# Patient Record
Sex: Female | Born: 1950 | Race: White | Hispanic: No | Marital: Single | Smoking: Former smoker
Health system: Southern US, Community
[De-identification: ages and names within clinical notes are randomized; demographics above are authoritative.]

## PROBLEM LIST (undated history)

## (undated) DIAGNOSIS — C688 Malignant neoplasm of overlapping sites of urinary organs: Secondary | ICD-10-CM

## (undated) DIAGNOSIS — J449 Chronic obstructive pulmonary disease, unspecified: Secondary | ICD-10-CM

## (undated) DIAGNOSIS — E119 Type 2 diabetes mellitus without complications: Secondary | ICD-10-CM

## (undated) DIAGNOSIS — J45909 Unspecified asthma, uncomplicated: Secondary | ICD-10-CM

## (undated) DIAGNOSIS — Z923 Personal history of irradiation: Secondary | ICD-10-CM

## (undated) DIAGNOSIS — M549 Dorsalgia, unspecified: Secondary | ICD-10-CM

## (undated) HISTORY — DX: Personal history of irradiation: Z92.3

## (undated) HISTORY — PX: APPENDECTOMY: SHX54

## (undated) HISTORY — PX: TONSILLECTOMY: SUR1361

---

## 1995-06-21 HISTORY — PX: OTHER SURGICAL HISTORY: SHX169

## 1998-09-22 ENCOUNTER — Inpatient Hospital Stay (HOSPITAL_COMMUNITY): Admission: EM | Admit: 1998-09-22 | Discharge: 1998-09-25 | Payer: Self-pay | Admitting: Emergency Medicine

## 1998-09-22 ENCOUNTER — Encounter: Payer: Self-pay | Admitting: Emergency Medicine

## 1998-09-26 ENCOUNTER — Encounter: Admission: RE | Admit: 1998-09-26 | Discharge: 1998-09-26 | Payer: Self-pay | Admitting: Sports Medicine

## 1998-11-02 ENCOUNTER — Encounter: Admission: RE | Admit: 1998-11-02 | Discharge: 1998-11-02 | Payer: Self-pay | Admitting: Family Medicine

## 1998-12-15 ENCOUNTER — Other Ambulatory Visit: Admission: RE | Admit: 1998-12-15 | Discharge: 1998-12-15 | Payer: Self-pay | Admitting: Obstetrics & Gynecology

## 1998-12-15 ENCOUNTER — Encounter: Admission: RE | Admit: 1998-12-15 | Discharge: 1998-12-15 | Payer: Self-pay | Admitting: Obstetrics & Gynecology

## 1999-01-01 ENCOUNTER — Encounter: Admission: RE | Admit: 1999-01-01 | Discharge: 1999-01-01 | Payer: Self-pay | Admitting: Family Medicine

## 1999-01-05 ENCOUNTER — Ambulatory Visit (HOSPITAL_COMMUNITY): Admission: RE | Admit: 1999-01-05 | Discharge: 1999-01-05 | Payer: Self-pay

## 1999-01-11 ENCOUNTER — Ambulatory Visit (HOSPITAL_COMMUNITY): Admission: RE | Admit: 1999-01-11 | Discharge: 1999-01-11 | Payer: Self-pay

## 1999-01-11 ENCOUNTER — Encounter: Payer: Self-pay | Admitting: Obstetrics & Gynecology

## 1999-02-12 ENCOUNTER — Encounter: Admission: RE | Admit: 1999-02-12 | Discharge: 1999-02-12 | Payer: Self-pay | Admitting: Family Medicine

## 1999-03-16 ENCOUNTER — Encounter: Admission: RE | Admit: 1999-03-16 | Discharge: 1999-03-16 | Payer: Self-pay | Admitting: Obstetrics & Gynecology

## 1999-03-17 ENCOUNTER — Encounter: Admission: RE | Admit: 1999-03-17 | Discharge: 1999-03-17 | Payer: Self-pay | Admitting: Family Medicine

## 1999-04-08 ENCOUNTER — Encounter: Payer: Self-pay | Admitting: *Deleted

## 1999-04-13 ENCOUNTER — Inpatient Hospital Stay (HOSPITAL_COMMUNITY): Admission: RE | Admit: 1999-04-13 | Discharge: 1999-04-14 | Payer: Self-pay | Admitting: *Deleted

## 1999-04-29 ENCOUNTER — Encounter: Admission: RE | Admit: 1999-04-29 | Discharge: 1999-04-29 | Payer: Self-pay | Admitting: Obstetrics

## 1999-05-28 ENCOUNTER — Encounter: Admission: RE | Admit: 1999-05-28 | Discharge: 1999-05-28 | Payer: Self-pay | Admitting: Family Medicine

## 1999-07-27 ENCOUNTER — Encounter: Admission: RE | Admit: 1999-07-27 | Discharge: 1999-07-27 | Payer: Self-pay | Admitting: Family Medicine

## 1999-07-27 ENCOUNTER — Ambulatory Visit (HOSPITAL_COMMUNITY): Admission: RE | Admit: 1999-07-27 | Discharge: 1999-07-27 | Payer: Self-pay | Admitting: *Deleted

## 1999-07-27 ENCOUNTER — Encounter: Payer: Self-pay | Admitting: *Deleted

## 1999-08-26 ENCOUNTER — Encounter: Admission: RE | Admit: 1999-08-26 | Discharge: 1999-08-26 | Payer: Self-pay | Admitting: Sports Medicine

## 1999-08-26 ENCOUNTER — Encounter: Payer: Self-pay | Admitting: Sports Medicine

## 1999-08-26 ENCOUNTER — Encounter: Admission: RE | Admit: 1999-08-26 | Discharge: 1999-08-26 | Payer: Self-pay | Admitting: Family Medicine

## 1999-08-30 ENCOUNTER — Encounter: Admission: RE | Admit: 1999-08-30 | Discharge: 1999-08-30 | Payer: Self-pay | Admitting: Family Medicine

## 1999-09-06 ENCOUNTER — Encounter: Admission: RE | Admit: 1999-09-06 | Discharge: 1999-09-06 | Payer: Self-pay | Admitting: Family Medicine

## 1999-09-07 ENCOUNTER — Ambulatory Visit (HOSPITAL_BASED_OUTPATIENT_CLINIC_OR_DEPARTMENT_OTHER): Admission: RE | Admit: 1999-09-07 | Discharge: 1999-09-07 | Payer: Self-pay | Admitting: Ophthalmology

## 1999-09-30 ENCOUNTER — Encounter: Admission: RE | Admit: 1999-09-30 | Discharge: 1999-09-30 | Payer: Self-pay | Admitting: Obstetrics

## 1999-10-06 ENCOUNTER — Encounter: Admission: RE | Admit: 1999-10-06 | Discharge: 1999-10-06 | Payer: Self-pay | Admitting: Family Medicine

## 1999-10-29 ENCOUNTER — Encounter: Admission: RE | Admit: 1999-10-29 | Discharge: 1999-10-29 | Payer: Self-pay | Admitting: Family Medicine

## 1999-11-17 ENCOUNTER — Encounter: Admission: RE | Admit: 1999-11-17 | Discharge: 1999-11-17 | Payer: Self-pay | Admitting: Family Medicine

## 1999-12-23 ENCOUNTER — Encounter: Admission: RE | Admit: 1999-12-23 | Discharge: 1999-12-23 | Payer: Self-pay | Admitting: Family Medicine

## 1999-12-30 ENCOUNTER — Encounter: Admission: RE | Admit: 1999-12-30 | Discharge: 1999-12-30 | Payer: Self-pay | Admitting: Family Medicine

## 2000-01-06 ENCOUNTER — Encounter: Admission: RE | Admit: 2000-01-06 | Discharge: 2000-01-06 | Payer: Self-pay | Admitting: Family Medicine

## 2000-01-13 ENCOUNTER — Encounter: Admission: RE | Admit: 2000-01-13 | Discharge: 2000-01-13 | Payer: Self-pay | Admitting: Family Medicine

## 2000-01-28 ENCOUNTER — Encounter: Payer: Self-pay | Admitting: *Deleted

## 2000-01-28 ENCOUNTER — Encounter: Admission: RE | Admit: 2000-01-28 | Discharge: 2000-01-28 | Payer: Self-pay | Admitting: *Deleted

## 2000-01-28 ENCOUNTER — Encounter: Admission: RE | Admit: 2000-01-28 | Discharge: 2000-01-28 | Payer: Self-pay | Admitting: Family Medicine

## 2000-02-23 ENCOUNTER — Encounter: Admission: RE | Admit: 2000-02-23 | Discharge: 2000-02-23 | Payer: Self-pay | Admitting: Family Medicine

## 2000-03-02 ENCOUNTER — Ambulatory Visit (HOSPITAL_COMMUNITY): Admission: RE | Admit: 2000-03-02 | Discharge: 2000-03-02 | Payer: Self-pay | Admitting: Family Medicine

## 2000-03-29 ENCOUNTER — Encounter: Admission: RE | Admit: 2000-03-29 | Discharge: 2000-03-29 | Payer: Self-pay | Admitting: Family Medicine

## 2000-04-18 ENCOUNTER — Encounter: Admission: RE | Admit: 2000-04-18 | Discharge: 2000-04-18 | Payer: Self-pay | Admitting: Family Medicine

## 2000-07-04 ENCOUNTER — Encounter: Admission: RE | Admit: 2000-07-04 | Discharge: 2000-07-04 | Payer: Self-pay | Admitting: Sports Medicine

## 2000-08-23 ENCOUNTER — Encounter: Admission: RE | Admit: 2000-08-23 | Discharge: 2000-08-23 | Payer: Self-pay | Admitting: Family Medicine

## 2000-10-19 ENCOUNTER — Encounter: Admission: RE | Admit: 2000-10-19 | Discharge: 2000-10-19 | Payer: Self-pay | Admitting: Sports Medicine

## 2000-10-24 ENCOUNTER — Encounter: Payer: Self-pay | Admitting: Sports Medicine

## 2000-10-24 ENCOUNTER — Encounter: Admission: RE | Admit: 2000-10-24 | Discharge: 2000-10-24 | Payer: Self-pay | Admitting: Sports Medicine

## 2001-01-08 ENCOUNTER — Encounter: Admission: RE | Admit: 2001-01-08 | Discharge: 2001-01-08 | Payer: Self-pay | Admitting: Family Medicine

## 2001-02-07 ENCOUNTER — Encounter: Admission: RE | Admit: 2001-02-07 | Discharge: 2001-02-07 | Payer: Self-pay | Admitting: Family Medicine

## 2001-02-16 ENCOUNTER — Encounter: Admission: RE | Admit: 2001-02-16 | Discharge: 2001-02-16 | Payer: Self-pay | Admitting: Sports Medicine

## 2001-02-23 ENCOUNTER — Encounter: Admission: RE | Admit: 2001-02-23 | Discharge: 2001-02-23 | Payer: Self-pay | Admitting: Family Medicine

## 2001-02-23 ENCOUNTER — Encounter: Payer: Self-pay | Admitting: Family Medicine

## 2001-03-22 ENCOUNTER — Encounter: Admission: RE | Admit: 2001-03-22 | Discharge: 2001-03-22 | Payer: Self-pay | Admitting: Family Medicine

## 2001-05-08 ENCOUNTER — Encounter: Admission: RE | Admit: 2001-05-08 | Discharge: 2001-05-08 | Payer: Self-pay | Admitting: Family Medicine

## 2001-05-08 ENCOUNTER — Encounter: Payer: Self-pay | Admitting: Sports Medicine

## 2001-05-08 ENCOUNTER — Encounter: Admission: RE | Admit: 2001-05-08 | Discharge: 2001-05-08 | Payer: Self-pay | Admitting: Sports Medicine

## 2001-05-16 ENCOUNTER — Encounter: Admission: RE | Admit: 2001-05-16 | Discharge: 2001-05-16 | Payer: Self-pay | Admitting: Family Medicine

## 2001-06-07 ENCOUNTER — Encounter: Admission: RE | Admit: 2001-06-07 | Discharge: 2001-06-07 | Payer: Self-pay | Admitting: Family Medicine

## 2001-07-16 ENCOUNTER — Encounter: Admission: RE | Admit: 2001-07-16 | Discharge: 2001-07-16 | Payer: Self-pay | Admitting: Sports Medicine

## 2001-10-04 ENCOUNTER — Encounter: Admission: RE | Admit: 2001-10-04 | Discharge: 2001-10-04 | Payer: Self-pay | Admitting: Sports Medicine

## 2001-10-23 ENCOUNTER — Encounter: Admission: RE | Admit: 2001-10-23 | Discharge: 2001-10-23 | Payer: Self-pay | Admitting: Sports Medicine

## 2001-10-23 ENCOUNTER — Encounter: Payer: Self-pay | Admitting: Sports Medicine

## 2001-12-25 ENCOUNTER — Encounter: Admission: RE | Admit: 2001-12-25 | Discharge: 2001-12-25 | Payer: Self-pay | Admitting: Family Medicine

## 2004-06-17 ENCOUNTER — Ambulatory Visit: Payer: Self-pay | Admitting: Pulmonary Disease

## 2004-09-14 ENCOUNTER — Ambulatory Visit: Payer: Self-pay | Admitting: Pulmonary Disease

## 2005-02-01 ENCOUNTER — Ambulatory Visit: Payer: Self-pay | Admitting: Pulmonary Disease

## 2006-01-19 ENCOUNTER — Ambulatory Visit: Payer: Self-pay | Admitting: Pulmonary Disease

## 2015-03-03 ENCOUNTER — Institutional Professional Consult (permissible substitution): Payer: Self-pay | Admitting: Pulmonary Disease

## 2015-03-10 ENCOUNTER — Encounter: Payer: Self-pay | Admitting: Pulmonary Disease

## 2015-03-19 ENCOUNTER — Institutional Professional Consult (permissible substitution): Payer: Self-pay | Admitting: Pulmonary Disease

## 2015-03-25 ENCOUNTER — Inpatient Hospital Stay (HOSPITAL_COMMUNITY)
Admission: EM | Admit: 2015-03-25 | Discharge: 2015-04-01 | DRG: 190 | Disposition: A | Discharge status: Hospice | Payer: Medicare Other | Attending: Internal Medicine | Admitting: Internal Medicine

## 2015-03-25 ENCOUNTER — Emergency Department (HOSPITAL_COMMUNITY): Payer: Medicare Other

## 2015-03-25 ENCOUNTER — Encounter (HOSPITAL_COMMUNITY): Payer: Self-pay | Admitting: Emergency Medicine

## 2015-03-25 DIAGNOSIS — J9621 Acute and chronic respiratory failure with hypoxia: Secondary | ICD-10-CM | POA: Insufficient documentation

## 2015-03-25 DIAGNOSIS — G8929 Other chronic pain: Secondary | ICD-10-CM | POA: Diagnosis present

## 2015-03-25 DIAGNOSIS — Z8673 Personal history of transient ischemic attack (TIA), and cerebral infarction without residual deficits: Secondary | ICD-10-CM | POA: Diagnosis not present

## 2015-03-25 DIAGNOSIS — Z66 Do not resuscitate: Secondary | ICD-10-CM | POA: Diagnosis present

## 2015-03-25 DIAGNOSIS — F32A Depression, unspecified: Secondary | ICD-10-CM

## 2015-03-25 DIAGNOSIS — F13239 Sedative, hypnotic or anxiolytic dependence with withdrawal, unspecified: Secondary | ICD-10-CM | POA: Diagnosis present

## 2015-03-25 DIAGNOSIS — Z79891 Long term (current) use of opiate analgesic: Secondary | ICD-10-CM

## 2015-03-25 DIAGNOSIS — Z515 Encounter for palliative care: Secondary | ICD-10-CM | POA: Insufficient documentation

## 2015-03-25 DIAGNOSIS — R400 Somnolence: Secondary | ICD-10-CM

## 2015-03-25 DIAGNOSIS — E1165 Type 2 diabetes mellitus with hyperglycemia: Secondary | ICD-10-CM | POA: Diagnosis present

## 2015-03-25 DIAGNOSIS — W19XXXA Unspecified fall, initial encounter: Secondary | ICD-10-CM

## 2015-03-25 DIAGNOSIS — R0602 Shortness of breath: Secondary | ICD-10-CM | POA: Diagnosis present

## 2015-03-25 DIAGNOSIS — R410 Disorientation, unspecified: Secondary | ICD-10-CM | POA: Diagnosis not present

## 2015-03-25 DIAGNOSIS — Z825 Family history of asthma and other chronic lower respiratory diseases: Secondary | ICD-10-CM | POA: Diagnosis not present

## 2015-03-25 DIAGNOSIS — J9601 Acute respiratory failure with hypoxia: Secondary | ICD-10-CM | POA: Diagnosis not present

## 2015-03-25 DIAGNOSIS — I4581 Long QT syndrome: Secondary | ICD-10-CM | POA: Diagnosis present

## 2015-03-25 DIAGNOSIS — G9340 Encephalopathy, unspecified: Secondary | ICD-10-CM | POA: Diagnosis present

## 2015-03-25 DIAGNOSIS — Z9981 Dependence on supplemental oxygen: Secondary | ICD-10-CM | POA: Diagnosis not present

## 2015-03-25 DIAGNOSIS — F329 Major depressive disorder, single episode, unspecified: Secondary | ICD-10-CM | POA: Diagnosis present

## 2015-03-25 DIAGNOSIS — J9622 Acute and chronic respiratory failure with hypercapnia: Secondary | ICD-10-CM | POA: Diagnosis present

## 2015-03-25 DIAGNOSIS — F17200 Nicotine dependence, unspecified, uncomplicated: Secondary | ICD-10-CM | POA: Diagnosis present

## 2015-03-25 DIAGNOSIS — E46 Unspecified protein-calorie malnutrition: Secondary | ICD-10-CM | POA: Diagnosis present

## 2015-03-25 DIAGNOSIS — E119 Type 2 diabetes mellitus without complications: Secondary | ICD-10-CM

## 2015-03-25 DIAGNOSIS — T424X5A Adverse effect of benzodiazepines, initial encounter: Secondary | ICD-10-CM | POA: Diagnosis present

## 2015-03-25 DIAGNOSIS — Z833 Family history of diabetes mellitus: Secondary | ICD-10-CM

## 2015-03-25 DIAGNOSIS — J441 Chronic obstructive pulmonary disease with (acute) exacerbation: Principal | ICD-10-CM | POA: Diagnosis present

## 2015-03-25 HISTORY — DX: Chronic obstructive pulmonary disease, unspecified: J44.9

## 2015-03-25 HISTORY — DX: Dorsalgia, unspecified: M54.9

## 2015-03-25 HISTORY — DX: Type 2 diabetes mellitus without complications: E11.9

## 2015-03-25 HISTORY — DX: Unspecified asthma, uncomplicated: J45.909

## 2015-03-25 LAB — AMMONIA: AMMONIA: 15 umol/L (ref 9–35)

## 2015-03-25 LAB — CBC WITH DIFFERENTIAL/PLATELET
BASOS ABS: 0 10*3/uL (ref 0.0–0.1)
BASOS PCT: 0 %
EOS ABS: 0 10*3/uL (ref 0.0–0.7)
EOS PCT: 0 %
HCT: 44.8 % (ref 36.0–46.0)
Hemoglobin: 14.4 g/dL (ref 12.0–15.0)
Lymphocytes Relative: 9 %
Lymphs Abs: 0.6 10*3/uL — ABNORMAL LOW (ref 0.7–4.0)
MCH: 29.9 pg (ref 26.0–34.0)
MCHC: 32.1 g/dL (ref 30.0–36.0)
MCV: 92.9 fL (ref 78.0–100.0)
MONO ABS: 0.5 10*3/uL (ref 0.1–1.0)
Monocytes Relative: 7 %
Neutro Abs: 5.9 10*3/uL (ref 1.7–7.7)
Neutrophils Relative %: 84 %
PLATELETS: 235 10*3/uL (ref 150–400)
RBC: 4.82 MIL/uL (ref 3.87–5.11)
RDW: 14.8 % (ref 11.5–15.5)
WBC: 7 10*3/uL (ref 4.0–10.5)

## 2015-03-25 LAB — I-STAT TROPONIN, ED: TROPONIN I, POC: 0.01 ng/mL (ref 0.00–0.08)

## 2015-03-25 LAB — COMPREHENSIVE METABOLIC PANEL
ALT: 9 U/L — AB (ref 14–54)
AST: 14 U/L — AB (ref 15–41)
Albumin: 4.7 g/dL (ref 3.5–5.0)
Alkaline Phosphatase: 103 U/L (ref 38–126)
Anion gap: 12 (ref 5–15)
BILIRUBIN TOTAL: 0.3 mg/dL (ref 0.3–1.2)
BUN: 11 mg/dL (ref 6–20)
CALCIUM: 9.6 mg/dL (ref 8.9–10.3)
CHLORIDE: 97 mmol/L — AB (ref 101–111)
CO2: 30 mmol/L (ref 22–32)
CREATININE: 0.62 mg/dL (ref 0.44–1.00)
Glucose, Bld: 167 mg/dL — ABNORMAL HIGH (ref 65–99)
Potassium: 3.9 mmol/L (ref 3.5–5.1)
Sodium: 139 mmol/L (ref 135–145)
TOTAL PROTEIN: 7.5 g/dL (ref 6.5–8.1)

## 2015-03-25 LAB — BLOOD GAS, ARTERIAL
Acid-Base Excess: 3 mmol/L — ABNORMAL HIGH (ref 0.0–2.0)
Bicarbonate: 29 mEq/L — ABNORMAL HIGH (ref 20.0–24.0)
Drawn by: 331471
O2 CONTENT: 4 L/min
O2 Saturation: 92.9 %
PATIENT TEMPERATURE: 98.6
PCO2 ART: 52.4 mmHg — AB (ref 35.0–45.0)
PH ART: 7.362 (ref 7.350–7.450)
PO2 ART: 67.9 mmHg — AB (ref 80.0–100.0)
TCO2: 25.7 mmol/L (ref 0–100)

## 2015-03-25 LAB — ETHANOL

## 2015-03-25 LAB — LIPASE, BLOOD: LIPASE: 22 U/L (ref 22–51)

## 2015-03-25 MED ORDER — METHYLPREDNISOLONE SODIUM SUCC 125 MG IJ SOLR
125.0000 mg | Freq: Once | INTRAMUSCULAR | Status: AC
  Administered 2015-03-25: 125 mg via INTRAVENOUS
  Filled 2015-03-25: qty 2

## 2015-03-25 MED ORDER — DEXTROSE 5 % IV SOLN
1.0000 g | INTRAVENOUS | Status: AC
  Administered 2015-03-26 – 2015-03-27 (×2): 1 g via INTRAVENOUS
  Filled 2015-03-25 (×2): qty 10

## 2015-03-25 MED ORDER — ALBUTEROL (5 MG/ML) CONTINUOUS INHALATION SOLN
15.0000 mg/h | INHALATION_SOLUTION | Freq: Once | RESPIRATORY_TRACT | Status: AC
  Administered 2015-03-25: 15 mg/h via RESPIRATORY_TRACT
  Filled 2015-03-25: qty 20

## 2015-03-25 MED ORDER — LORAZEPAM 2 MG/ML IJ SOLN
1.0000 mg | Freq: Once | INTRAMUSCULAR | Status: AC
  Administered 2015-03-25: 1 mg via INTRAVENOUS
  Filled 2015-03-25: qty 1

## 2015-03-25 MED ORDER — SODIUM CHLORIDE 0.9 % IV BOLUS (SEPSIS)
1000.0000 mL | Freq: Once | INTRAVENOUS | Status: AC
  Administered 2015-03-25: 1000 mL via INTRAVENOUS

## 2015-03-25 MED ORDER — ONDANSETRON HCL 4 MG/2ML IJ SOLN
4.0000 mg | Freq: Once | INTRAMUSCULAR | Status: AC
  Administered 2015-03-25: 4 mg via INTRAVENOUS
  Filled 2015-03-25: qty 2

## 2015-03-25 MED ORDER — MORPHINE SULFATE (PF) 2 MG/ML IV SOLN
1.0000 mg | INTRAVENOUS | Status: DC | PRN
  Administered 2015-03-25 – 2015-03-26 (×2): 1 mg via INTRAVENOUS
  Filled 2015-03-25 (×2): qty 1

## 2015-03-25 MED ORDER — DEXTROSE 5 % IV SOLN
1.0000 g | Freq: Once | INTRAVENOUS | Status: AC
  Administered 2015-03-25: 1 g via INTRAVENOUS
  Filled 2015-03-25: qty 10

## 2015-03-25 MED ORDER — IPRATROPIUM BROMIDE 0.02 % IN SOLN
1.0000 mg | Freq: Once | RESPIRATORY_TRACT | Status: AC
  Administered 2015-03-25: 1 mg via RESPIRATORY_TRACT
  Filled 2015-03-25: qty 5

## 2015-03-25 MED ORDER — SALINE SPRAY 0.65 % NA SOLN
1.0000 | NASAL | Status: DC | PRN
  Filled 2015-03-25: qty 44

## 2015-03-25 MED ORDER — MAGNESIUM SULFATE 2 GM/50ML IV SOLN
2.0000 g | Freq: Once | INTRAVENOUS | Status: AC
  Administered 2015-03-25: 2 g via INTRAVENOUS
  Filled 2015-03-25: qty 50

## 2015-03-25 MED ORDER — KETOROLAC TROMETHAMINE 30 MG/ML IJ SOLN: 30.0000 mg | Freq: Four times a day (QID) | INTRAMUSCULAR | Status: DC | PRN

## 2015-03-25 NOTE — ED Notes (Signed)
Per [facility], pt coming from home has veen having n/v/SOB x 1 week.  Has a productive cough, is oxygen dependent on 3L.  Has hx of high cholesterol, COPD, diabetes.

## 2015-03-25 NOTE — ED Notes (Signed)
Bed: WA06 Expected date:  Expected time:  Means of arrival:  Comments: Ems- 64 yo N/V

## 2015-03-25 NOTE — Progress Notes (Signed)
Cm noted pt with medicaid [place] access Cm noted Dr Heide Scales as pcp  CM attempted to assess pt but she is a poor historian Pt is in and out  Cm spoke with staff at pcp office confirmed pcp and that pt had been seen 03/24/15  Pt keep stating she filed "in [place]" Not sure what pt is referring to

## 2015-03-25 NOTE — Progress Notes (Signed)
Brief Pharmacy note: Rocephin  Received Rocephin 1gm in ED today, plan 2 more days Rocephin for CAP/COPD exacerbation, pharmacy dosing assistance requested.  Rocephin needs no adjustment for renal function: patient's renal function wnl  Rocephin 1gm q24 starting tomorrow at 1600 x 2 more doses.  Thank you,  Minda Ditto PharmD Pager [phone] 03/25/2015, 6:43 PM

## 2015-03-25 NOTE — H&P (Signed)
[facility] History and Physical  Traci Thomas IEP:[id number] DOB: 1950-08-25 DOA: 03/25/2015  Referring physician:  Sherwood Gambler PCP:  [name], NP   Chief Complaint:  Fall and increased SOB  HPI:  The patient is a 64 y.o. year-old female with history of COPD on 3L [place] with ongoing smoking, benzodiazepine dependence, chronic pain on chronic narcotics, depression, diabetes mellitus type 2, bladder spasms who presents with mechanical fall and increased SOB.  History is limited by patient's delirium and somnolence.  Patient reports that she tripped over a portable toilet and hit her head, her left arm, and landed on her hip.  She came to the ER because of her fall and ongoing headache and neck pain.  Incidentally, she reported that she has had some increased SOB over the last few days with increased cough.  She was unable to tell me if she had increased sputum or changes to sputum color.  She denied fevers.  She still smokes about a pack per day and takes her oxygen off when she does.    In the ER, her VS were notable for temperature of 99.64F, pulse in the low 100s, RR in mid 20s, BP as high as 184/87, O2 sat 78% on her home 3L .  Labs were wnl except for blood gas with pH 7.36, pCO2 52.4, pO2 67.9.  Bibasilar pleural thickening on CXR.  XR of the left formearm and pelvis were wnl.  CT head and cervical spine demionstrated no acute injury but moderate diffuse cortical atrophy and moderate chronic ischemic white matter disease.  She was given  Duoneb, Magnesium, solumedrol, ativan, zofran, and ceftriaxone.  She had significant tremors in ER which she attributed to not taking her xanax today.  She does not take her xanax as prescribed.  She reported taking 2 tablets with breakfast and then taking additional doses throughout the day as needed, up to four times per day.  She is being admitted to stepdown due to current somnolence and confusion in the setting of possible benzodiazepine withdrawal  and COPD exacerbation.    Review of Systems:  Limited by patient's confusion/delirium.      Past Medical History  Diagnosis Date  . Diabetes mellitus without complication ([place])   . Asthma   . Back pain   . COPD (chronic obstructive pulmonary disease) (HCC)     Home O2 3lpm, theophylline   Past Surgical History  Procedure Laterality Date  . Appendectomy    . Tonsillectomy     Social History:  reports that she has been smoking.  She does not have any smokeless tobacco history on file. She reports that she drinks alcohol. She reports that she does not use illicit drugs.  Allergies  Allergen Reactions  . Adhesive [Tape] Other (See Comments)    Skin peels off   . Ciprofloxacin     Unknown reaction   . Prednisone Nausea And Vomiting    Family History  Problem Relation Age of Onset  . COPD Mother   . Diabetes       Prior to Admission medications   Medication Sig Start Date End Date Taking? Authorizing Provider  albuterol (PROVENTIL HFA;VENTOLIN HFA) 108 (90 BASE) MCG/ACT inhaler Inhale 2 puffs into the lungs every 6 (six) hours as needed for wheezing or shortness of breath.   Yes Historical Provider, MD  ALPRAZolam Duanne Moron) 1 MG tablet Take 1 mg by mouth every 6 (six) hours.   Yes Historical Provider, MD  Azelastine-Fluticasone [facility]) 137-50  MCG/ACT SUSP Place into the nose.   Yes Historical Provider, MD  desvenlafaxine (PRISTIQ) 100 MG 24 hr tablet Take 100 mg by mouth daily.   Yes Historical Provider, MD  HYDROcodone-acetaminophen (NORCO) 10-325 MG tablet Take 1 tablet by mouth 3 (three) times daily.   Yes Historical Provider, MD  lansoprazole (PREVACID) 30 MG capsule Take 30 mg by mouth 2 (two) times daily before a meal.   Yes Historical Provider, MD  metFORMIN (GLUCOPHAGE) 500 MG tablet Take 500 mg by mouth daily with breakfast.   Yes Historical Provider, MD  mometasone (NASONEX) 50 MCG/ACT nasal spray Place 2 sprays into the nose daily.   Yes Historical Provider, MD   mometasone-formoterol (DULERA) 200-5 MCG/ACT AERO Inhale 2 puffs into the lungs 2 (two) times daily.   Yes Historical Provider, MD  nystatin cream (MYCOSTATIN) Apply 1 application topically 2 (two) times daily.   Yes Historical Provider, MD  oxybutynin (DITROPAN) 5 MG tablet Take 5 mg by mouth at bedtime.   Yes Historical Provider, MD  potassium chloride SA (K-DUR,KLOR-CON) 20 MEQ tablet Take 60 mEq by mouth 3 (three) times daily. Suppose to take for 3 days 03/19/15  Yes Historical Provider, MD  theophylline (THEO-24) 300 MG 24 hr capsule Take 300 mg by mouth 2 (two) times daily.   Yes Historical Provider, MD  triamcinolone cream (KENALOG) 0.1 % Apply 1 application topically 2 (two) times daily.   Yes Historical Provider, MD  Vitamin D, Ergocalciferol, (DRISDOL) 50000 UNITS CAPS capsule Take 50,000 Units by mouth every 7 (seven) days.   Yes Historical Provider, MD   Physical Exam: Filed Vitals:   03/25/15 1244 03/25/15 1407 03/25/15 1556 03/25/15 1600  BP:  184/87 140/80 145/73  Pulse:  120 109 107  Temp:  99.7 F (37.6 C) 99.2 F (37.3 C)   TempSrc:  Rectal Oral   Resp:  25 24 21   SpO2: 91% 89% 93% 89%     General:  Cachectic female, somnolent and falls asleep frequently during interview, unable to answer most questions.  Nonsensical talking about magazines.  Course tremors/myoclonus.    Eyes:  PERRL, anicteric, non-injected.  ENT:  Nares clear.  OP clear, non-erythematous without plaques or exudates.  Dry MM.   Neck:  Supple without TM or JVD.    Lymph:  No cervical, supraclavicular, or submandibular LAD.  Cardiovascular:  Tachycardic RR, normal S1, S2, without m/r/g.  2+ pulses, warm extremities  Respiratory:  Very diminished bilateral breath sounds without focal rales, rhonchi, or wheeze  Abdomen:  NABS.  Soft, ND/NT.    Skin:  Left forearm with 15cm purple scratch without ongoing bleeding  Musculoskeletal:  Decreased bulk and increased tone.  No LE edema.  Psychiatric:   A & O to person and eventually place, but not time.  Appropriate affect.  Neurologic:  CN 3-12 intact.  4/5 strength throughout.  Tongue fasciculations, course tremors.  Sensation intact.  Labs on Admission:  Basic Metabolic Panel:  Recent Labs Lab 03/25/15 1318  NA 139  K 3.9  CL 97*  CO2 30  GLUCOSE 167*  BUN 11  CREATININE 0.62  CALCIUM 9.6   Liver Function Tests:  Recent Labs Lab 03/25/15 1318  AST 14*  ALT 9*  ALKPHOS 103  BILITOT 0.3  PROT 7.5  ALBUMIN 4.7    Recent Labs Lab 03/25/15 1318  LIPASE 22   No results for input(s): AMMONIA in the last 168 hours. CBC:  Recent Labs Lab 03/25/15 1318  WBC  7.0  NEUTROABS 5.9  HGB 14.4  HCT 44.8  MCV 92.9  PLT 235   Cardiac Enzymes: No results for input(s): CKTOTAL, CKMB, CKMBINDEX, TROPONINI in the last 168 hours.  BNP (last 3 results) No results for input(s): BNP in the last 8760 hours.  ProBNP (last 3 results) No results for input(s): PROBNP in the last 8760 hours.  CBG: No results for input(s): GLUCAP in the last 168 hours.  Radiological Exams on Admission: Dg Pelvis 1-2 Views  03/25/2015   CLINICAL DATA:  Acute pelvic pain after fall in bathroom today.  EXAM: PELVIS - 1-2 VIEW  COMPARISON:  March 30, 2012.  FINDINGS: There is no evidence of pelvic fracture or diastasis. No pelvic bone lesions are seen. Hip and sacroiliac joints appear normal.  IMPRESSION: Normal pelvis.   Electronically Signed   By: Marijo Conception, M.D.   On: 03/25/2015 14:47   Dg Forearm Left  03/25/2015   CLINICAL DATA:  Fall.  EXAM: LEFT FOREARM - 2 VIEW  COMPARISON:  None.  FINDINGS: Diffuse osteopenia. Degenerative change noted about the left wrist and elbow. No acute bony or joint abnormality.  IMPRESSION: Negative.   Electronically Signed   By: Marcello Moores  Register   On: 03/25/2015 14:47   Ct Head Wo Contrast  03/25/2015   CLINICAL DATA:  Posttraumatic headaches and left-sided neck pain after fall this morning.  EXAM: CT  HEAD WITHOUT CONTRAST  CT CERVICAL SPINE WITHOUT CONTRAST  TECHNIQUE: Multidetector CT imaging of the head and cervical spine was performed following the standard protocol without intravenous contrast. Multiplanar CT image reconstructions of the cervical spine were also generated.  COMPARISON:  None.  FINDINGS: CT HEAD FINDINGS  Bony calvarium appears intact. Moderate diffuse cortical atrophy is noted. Moderate chronic ischemic white matter disease is noted. 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 No mass effect or midline shift is noted. Ventricular size is within normal limits. There is no evidence of mass lesion, hemorrhage or acute infarction.  CT CERVICAL SPINE FINDINGS  No fracture or spondylolisthesis is noted. Disc spaces and posterior facet joints appear intact. Visualized lung apices appear normal.  IMPRESSION: Moderate diffuse cortical atrophy. Moderate chronic ischemic white matter disease. No acute intracranial abnormality seen.  No significant abnormality seen in the cervical spine.   Electronically Signed   By: Marijo Conception, M.D.   On: 03/25/2015 15:15   Ct Cervical Spine Wo Contrast  03/25/2015   CLINICAL DATA:  Posttraumatic headaches and left-sided neck pain after fall this morning.  EXAM: CT HEAD WITHOUT CONTRAST  CT CERVICAL SPINE WITHOUT CONTRAST  TECHNIQUE: Multidetector CT imaging of the head and cervical spine was performed following the standard protocol without intravenous contrast. Multiplanar CT image reconstructions of the cervical spine were also generated.  COMPARISON:  None.  FINDINGS: CT HEAD FINDINGS  Bony calvarium appears intact. Moderate diffuse cortical atrophy is noted. Moderate chronic ischemic white matter disease is noted. 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 No mass effect or midline shift is noted. Ventricular size is within normal limits. There is no evidence of mass lesion, hemorrhage or acute infarction.  CT CERVICAL SPINE FINDINGS  No  fracture or spondylolisthesis is noted. Disc spaces and posterior facet joints appear intact. Visualized lung apices appear normal.  IMPRESSION: Moderate diffuse cortical atrophy. Moderate chronic ischemic white matter disease. No acute intracranial abnormality seen.  No significant abnormality seen in the cervical spine.   Electronically Signed   By: Jeneen Rinks  Murlean Caller, M.D.   On: 03/25/2015 15:15   Dg Chest Port 1 View  03/25/2015   CLINICAL DATA:  Shortness of breath.  Fall.  EXAM: PORTABLE CHEST 1 VIEW  COMPARISON:  01/18/2014.  01/16/2015.  06/27/2014.  FINDINGS: Mediastinum and hilar structures are normal. Bibasilar pleural parenchymal thickening again noted consistent with scarring. Heart size normal. No pleural effusion or pneumothorax. No acute bony abnormality .  IMPRESSION: Bibasilar pleural parenchymal thickening noted consistent with scarring.   Electronically Signed   By: Marcello Moores  Register   On: 03/25/2015 13:36    EKG: Independently reviewed.  Sinus tachycardia, suggestion of RVH and prolonged QTc 507.   Assessment/Plan Active Problems:   COPD exacerbation (HCC)   Acute respiratory failure with hypoxia (HCC)   Somnolence   Acute delirium   Diabetes mellitus type 2 in nonobese (HCC)   Depression  ---  Somnolence and delirium, possibly due to concussion, overmedication with multiple sedating medications, acute hypoxia due to COPD exacerbation.  May have some underlying dementia given cortical atrophy which is exacerbating problem.  -  Hold narcotics and use toradol as needed for pain instead -  Judicious use of benzodiazepines -  Hold oxybutynin -  Check ethanol level and UDS -  Check ammonia level -  CLD for now and then advance once more alert/awake  Acute hypoxic respiratory failure secondary to acute COPD exacerbation -  Continue solumedrol 60mg  IV q6h -  Duonebs q4h with albuterol q2h prn -  Start budesonide and brovana -  Continue theophylline.   -  Continue  ceftriaxone  Myoclonus/tremor possibly due to benzodiazepine withdrawal but can also be sign of theophylline toxicity -  Check theophylline level  -  Start CIWA protocol  Mechanical fall  -  No evidence of fracture or head/neck trauma  Diabetes mellitus type 2 -  A1c -  Hold metformin -  Start low dose SSI and monitor for hyperglycemia  Depression, stable, continue pristiq  Prolonged QTc.  Avoid QTc prolonging medications.    Diet:  diabetic diet Access:  PIV IVF:  yes Proph:  lovenox  Code Status: full Family Communication: patient alone Disposition Plan: Admit to stepdown  Time spent: 60 min Janece Canterbury [facility] Pager [phone]  If 7PM-7AM, please contact night-coverage [email or web address] Password [email or web address] 03/25/2015, 6:15 PM

## 2015-03-25 NOTE — ED Provider Notes (Signed)
CSN: [id number]     Arrival date & time 03/25/15  1213 History   First MD Initiated Contact with Patient 03/25/15 1220     Chief Complaint  Patient presents with  . Emesis  . Diarrhea  . Shortness of Breath     (Consider location/radiation/quality/duration/timing/severity/associated sxs/prior Treatment) HPI  64 year old female with a history of COPD on chronic oxygen of 3 L presents with a fall. Patient states that around 5 AM this morning she's getting up to go the bathroom and on her way back ran into an over the portable toilet. She states she hit her head and has neck and back pain. Patient also endorses nausea, vomiting, and diarrhea 1 week. Saw her PCP yesterday and was to get a chest x-ray today for cough and shortness of breath is been ongoing for the past 1 week. Cough has been productive of sputum. Patient states she has "severe COPD". Currently complaining of a headache, neck pain, and left forearm pain. No hip pain.  No past medical history on file. No past surgical history on file. No family history on file. Social History  Substance Use Topics  . Smoking status: Not on file  . Smokeless tobacco: Not on file  . Alcohol Use: Not on file   OB History    No data available     Review of Systems  Respiratory: Positive for cough and shortness of breath.   Cardiovascular: Positive for chest pain.  Gastrointestinal: Positive for nausea, vomiting and diarrhea. Negative for abdominal pain.  Musculoskeletal: Positive for neck pain.  Neurological: Positive for headaches.  All other systems reviewed and are negative.     Allergies  Review of patient's allergies indicates not on file.  Home Medications   Prior to Admission medications   Not on File   BP 161/93 mmHg  Pulse 103  Temp(Src) 97.4 F (36.3 C) (Oral)  Resp 22 Physical Exam  Constitutional: She is oriented to person, place, and time.  Frail, chronically ill appearance  HENT:  Head: Normocephalic and  atraumatic.  Right Ear: External ear normal.  Left Ear: External ear normal.  Nose: Nose normal.  Eyes: Right eye exhibits no discharge. Left eye exhibits no discharge.  Neck: Muscular tenderness present.  Cardiovascular: Normal rate, regular rhythm and normal heart sounds.   Pulmonary/Chest: Effort normal. Tachypnea noted. No respiratory distress. She has decreased breath sounds (diffusely). She has wheezes (faint, expiratory).  Abdominal: Soft. There is no tenderness.  Musculoskeletal:       Left forearm: She exhibits tenderness.       Arms: Neurological: She is alert and oriented to person, place, and time.  Skin: Skin is warm and dry.  Nursing note and vitals reviewed.   ED Course  Procedures (including critical care time) Labs Review Labs Reviewed  COMPREHENSIVE METABOLIC PANEL - Abnormal; Notable for the following:    Chloride 97 (*)    Glucose, Bld 167 (*)    AST 14 (*)    ALT 9 (*)    All other components within normal limits  CBC WITH DIFFERENTIAL/PLATELET - Abnormal; Notable for the following:    Lymphs Abs 0.6 (*)    All other components within normal limits  BLOOD GAS, ARTERIAL - Abnormal; Notable for the following:    pCO2 arterial 52.4 (*)    pO2, Arterial 67.9 (*)    Bicarbonate 29.0 (*)    Acid-Base Excess 3.0 (*)    All other components within normal limits  CULTURE, BLOOD (  ROUTINE X 2)  CULTURE, BLOOD (ROUTINE X 2)  LIPASE, BLOOD  URINALYSIS, ROUTINE W REFLEX MICROSCOPIC (NOT AT [facility])  I-STAT TROPOININ, ED    Imaging Review Dg Pelvis 1-2 Views  03/25/2015   CLINICAL DATA:  Acute pelvic pain after fall in bathroom today.  EXAM: PELVIS - 1-2 VIEW  COMPARISON:  March 30, 2012.  FINDINGS: There is no evidence of pelvic fracture or diastasis. No pelvic bone lesions are seen. Hip and sacroiliac joints appear normal.  IMPRESSION: Normal pelvis.   Electronically Signed   By: Marijo Conception, M.D.   On: 03/25/2015 14:47   Dg Forearm Left  03/25/2015    CLINICAL DATA:  Fall.  EXAM: LEFT FOREARM - 2 VIEW  COMPARISON:  None.  FINDINGS: Diffuse osteopenia. Degenerative change noted about the left wrist and elbow. No acute bony or joint abnormality.  IMPRESSION: Negative.   Electronically Signed   By: Marcello Moores  Register   On: 03/25/2015 14:47   Ct Head Wo Contrast  03/25/2015   CLINICAL DATA:  Posttraumatic headaches and left-sided neck pain after fall this morning.  EXAM: CT HEAD WITHOUT CONTRAST  CT CERVICAL SPINE WITHOUT CONTRAST  TECHNIQUE: Multidetector CT imaging of the head and cervical spine was performed following the standard protocol without intravenous contrast. Multiplanar CT image reconstructions of the cervical spine were also generated.  COMPARISON:  None.  FINDINGS: CT HEAD FINDINGS  Bony calvarium appears intact. Moderate diffuse cortical atrophy is noted. Moderate chronic ischemic white matter disease is noted. 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 No mass effect or midline shift is noted. Ventricular size is within normal limits. There is no evidence of mass lesion, hemorrhage or acute infarction.  CT CERVICAL SPINE FINDINGS  No fracture or spondylolisthesis is noted. Disc spaces and posterior facet joints appear intact. Visualized lung apices appear normal.  IMPRESSION: Moderate diffuse cortical atrophy. Moderate chronic ischemic white matter disease. No acute intracranial abnormality seen.  No significant abnormality seen in the cervical spine.   Electronically Signed   By: Marijo Conception, M.D.   On: 03/25/2015 15:15   Ct Cervical Spine Wo Contrast  03/25/2015   CLINICAL DATA:  Posttraumatic headaches and left-sided neck pain after fall this morning.  EXAM: CT HEAD WITHOUT CONTRAST  CT CERVICAL SPINE WITHOUT CONTRAST  TECHNIQUE: Multidetector CT imaging of the head and cervical spine was performed following the standard protocol without intravenous contrast. Multiplanar CT image reconstructions of the cervical spine were also  generated.  COMPARISON:  None.  FINDINGS: CT HEAD FINDINGS  Bony calvarium appears intact. Moderate diffuse cortical atrophy is noted. Moderate chronic ischemic white matter disease is noted. 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 No mass effect or midline shift is noted. Ventricular size is within normal limits. There is no evidence of mass lesion, hemorrhage or acute infarction.  CT CERVICAL SPINE FINDINGS  No fracture or spondylolisthesis is noted. Disc spaces and posterior facet joints appear intact. Visualized lung apices appear normal.  IMPRESSION: Moderate diffuse cortical atrophy. Moderate chronic ischemic white matter disease. No acute intracranial abnormality seen.  No significant abnormality seen in the cervical spine.   Electronically Signed   By: Marijo Conception, M.D.   On: 03/25/2015 15:15   Dg Chest Port 1 View  03/25/2015   CLINICAL DATA:  Shortness of breath.  Fall.  EXAM: PORTABLE CHEST 1 VIEW  COMPARISON:  01/18/2014.  01/16/2015.  06/27/2014.  FINDINGS: Mediastinum and hilar structures are normal.  Bibasilar pleural parenchymal thickening again noted consistent with scarring. Heart size normal. No pleural effusion or pneumothorax. No acute bony abnormality .  IMPRESSION: Bibasilar pleural parenchymal thickening noted consistent with scarring.   Electronically Signed   By: Marcello Moores  Register   On: 03/25/2015 13:36   I have personally reviewed and evaluated these images and lab results as part of my medical decision-making.   EKG Interpretation   Date/Time:  Wednesday March 25 2015 12:36:04 EDT Ventricular Rate:  101 PR Interval:  151 QRS Duration: 94 QT Interval:  391 QTC Calculation: 507 R Axis:   100 Text Interpretation:  Sinus tachycardia Biatrial enlargement Consider  right ventricular hypertrophy Prolonged QT interval Confirmed by Regenia Skeeter   MD, Shaela Boer ([id number]) on 03/25/2015 12:40:13 PM      MDM   Final diagnoses:  COPD exacerbation ([place])    Patient with a  significant COPD exacerbation with hypoxia on her home 3 L of oxygen. Work of breathing has improved after continuous albuterol and Atrovent. Was given IV Solu-Medrol as well as IV magnesium. Do not think she needs BiPAP at this moment she is improving but still with significant increased work of breathing. Given the significant COPD exacerbation she was given IV antibiotics. No focal pneumonia seen on chest x-ray. As for her fall this sounds mechanical and there are no significant bony injuries noted. Plan to admit to the hospitalist to the stepdown unit for close monitoring and respiratory support.    Sherwood Gambler, MD 03/25/15 [phone]

## 2015-03-26 LAB — URINALYSIS, ROUTINE W REFLEX MICROSCOPIC
Bilirubin Urine: NEGATIVE
GLUCOSE, UA: NEGATIVE mg/dL
HGB URINE DIPSTICK: NEGATIVE
KETONES UR: NEGATIVE mg/dL
LEUKOCYTES UA: NEGATIVE
Nitrite: NEGATIVE
PH: 6 (ref 5.0–8.0)
Protein, ur: NEGATIVE mg/dL
Specific Gravity, Urine: 1.022 (ref 1.005–1.030)
Urobilinogen, UA: 0.2 mg/dL (ref 0.0–1.0)

## 2015-03-26 LAB — RAPID URINE DRUG SCREEN, HOSP PERFORMED
AMPHETAMINES: NOT DETECTED
BENZODIAZEPINES: POSITIVE — AB
Barbiturates: NOT DETECTED
Cocaine: NOT DETECTED
OPIATES: POSITIVE — AB
Tetrahydrocannabinol: NOT DETECTED

## 2015-03-26 LAB — CBC
HEMATOCRIT: 37.2 % (ref 36.0–46.0)
Hemoglobin: 11.8 g/dL — ABNORMAL LOW (ref 12.0–15.0)
MCH: 29.8 pg (ref 26.0–34.0)
MCHC: 31.7 g/dL (ref 30.0–36.0)
MCV: 93.9 fL (ref 78.0–100.0)
PLATELETS: 226 10*3/uL (ref 150–400)
RBC: 3.96 MIL/uL (ref 3.87–5.11)
RDW: 15 % (ref 11.5–15.5)
WBC: 9.1 10*3/uL (ref 4.0–10.5)

## 2015-03-26 LAB — GLUCOSE, CAPILLARY
Glucose-Capillary: 127 mg/dL — ABNORMAL HIGH (ref 65–99)
Glucose-Capillary: 156 mg/dL — ABNORMAL HIGH (ref 65–99)
Glucose-Capillary: 302 mg/dL — ABNORMAL HIGH (ref 65–99)
Glucose-Capillary: 290 mg/dL — ABNORMAL HIGH (ref 65–99)

## 2015-03-26 LAB — BASIC METABOLIC PANEL
Anion gap: 8 (ref 5–15)
BUN: 15 mg/dL (ref 6–20)
CHLORIDE: 99 mmol/L — AB (ref 101–111)
CO2: 32 mmol/L (ref 22–32)
Calcium: 9.3 mg/dL (ref 8.9–10.3)
Creatinine, Ser: 0.7 mg/dL (ref 0.44–1.00)
Glucose, Bld: 118 mg/dL — ABNORMAL HIGH (ref 65–99)
POTASSIUM: 3.6 mmol/L (ref 3.5–5.1)
SODIUM: 139 mmol/L (ref 135–145)

## 2015-03-26 LAB — THEOPHYLLINE LEVEL: THEOPHYLLINE LVL: 3.5 ug/mL — AB (ref 10.0–20.0)

## 2015-03-26 LAB — MRSA PCR SCREENING: MRSA BY PCR: NEGATIVE

## 2015-03-26 MED ORDER — PROMETHAZINE HCL 25 MG PO TABS: 12.5000 mg | ORAL_TABLET | Freq: Four times a day (QID) | ORAL | Status: DC | PRN

## 2015-03-26 MED ORDER — BUDESONIDE 0.25 MG/2ML IN SUSP
0.2500 mg | Freq: Two times a day (BID) | RESPIRATORY_TRACT | Status: DC
  Administered 2015-03-26 – 2015-04-01 (×12): 0.25 mg via RESPIRATORY_TRACT
  Filled 2015-03-26 (×14): qty 2

## 2015-03-26 MED ORDER — LORAZEPAM 1 MG PO TABS: 0.0000 mg | ORAL_TABLET | Freq: Two times a day (BID) | ORAL | Status: DC

## 2015-03-26 MED ORDER — THIAMINE HCL 100 MG/ML IJ SOLN: 100.0000 mg | Freq: Every day | INTRAMUSCULAR | Status: DC

## 2015-03-26 MED ORDER — LORAZEPAM 1 MG PO TABS: 1.0000 mg | ORAL_TABLET | Freq: Four times a day (QID) | ORAL | Status: DC | PRN

## 2015-03-26 MED ORDER — DICLOFENAC SODIUM 1 % TD GEL
4.0000 g | Freq: Four times a day (QID) | TRANSDERMAL | Status: DC | PRN
  Filled 2015-03-26: qty 100

## 2015-03-26 MED ORDER — INSULIN ASPART 100 UNIT/ML ~~LOC~~ SOLN
3.0000 [IU] | Freq: Three times a day (TID) | SUBCUTANEOUS | Status: DC
  Administered 2015-03-27 – 2015-03-28 (×5): 3 [IU] via SUBCUTANEOUS

## 2015-03-26 MED ORDER — LORAZEPAM 1 MG PO TABS
0.0000 mg | ORAL_TABLET | Freq: Four times a day (QID) | ORAL | Status: DC
  Administered 2015-03-26 (×2): 1 mg via ORAL
  Filled 2015-03-26 (×2): qty 1

## 2015-03-26 MED ORDER — FLUTICASONE PROPIONATE 50 MCG/ACT NA SUSP
1.0000 | Freq: Two times a day (BID) | NASAL | Status: DC
  Administered 2015-03-26 – 2015-03-31 (×10): 1 via NASAL
  Filled 2015-03-26: qty 16

## 2015-03-26 MED ORDER — POTASSIUM CHLORIDE IN NACL 20-0.9 MEQ/L-% IV SOLN
INTRAVENOUS | Status: AC
  Administered 2015-03-26: 08:00:00 via INTRAVENOUS
  Filled 2015-03-26 (×2): qty 1000

## 2015-03-26 MED ORDER — THEOPHYLLINE ER 300 MG PO CP24
300.0000 mg | ORAL_CAPSULE | Freq: Two times a day (BID) | ORAL | Status: DC
  Filled 2015-03-26 (×4): qty 1

## 2015-03-26 MED ORDER — IPRATROPIUM-ALBUTEROL 0.5-2.5 (3) MG/3ML IN SOLN
3.0000 mL | Freq: Four times a day (QID) | RESPIRATORY_TRACT | Status: DC
  Administered 2015-03-26 – 2015-04-01 (×22): 3 mL via RESPIRATORY_TRACT
  Filled 2015-03-26 (×25): qty 3

## 2015-03-26 MED ORDER — VITAMIN B-1 100 MG PO TABS
100.0000 mg | ORAL_TABLET | Freq: Every day | ORAL | Status: DC
  Administered 2015-03-26 – 2015-03-27 (×2): 100 mg via ORAL
  Filled 2015-03-26 (×2): qty 1

## 2015-03-26 MED ORDER — LORAZEPAM 2 MG/ML IJ SOLN
1.0000 mg | Freq: Four times a day (QID) | INTRAMUSCULAR | Status: DC | PRN
Start: 2015-03-26 — End: 2015-03-26
  Administered 2015-03-26: 1 mg via INTRAVENOUS
  Filled 2015-03-26: qty 1

## 2015-03-26 MED ORDER — INSULIN ASPART 100 UNIT/ML ~~LOC~~ SOLN
0.0000 [IU] | Freq: Three times a day (TID) | SUBCUTANEOUS | Status: DC
  Administered 2015-03-26: 5 [IU] via SUBCUTANEOUS
  Administered 2015-03-26: 2 [IU] via SUBCUTANEOUS
  Administered 2015-03-26: 1 [IU] via SUBCUTANEOUS
  Administered 2015-03-27: 3 [IU] via SUBCUTANEOUS
  Administered 2015-03-27: 2 [IU] via SUBCUTANEOUS
  Administered 2015-03-27: 1 [IU] via SUBCUTANEOUS
  Administered 2015-03-28: 5 [IU] via SUBCUTANEOUS
  Administered 2015-03-28: 1 [IU] via SUBCUTANEOUS
  Administered 2015-03-28: 2 [IU] via SUBCUTANEOUS
  Administered 2015-03-29: 7 [IU] via SUBCUTANEOUS
  Administered 2015-03-29: 3 [IU] via SUBCUTANEOUS
  Administered 2015-03-29: 1 [IU] via SUBCUTANEOUS

## 2015-03-26 MED ORDER — VENLAFAXINE HCL ER 150 MG PO CP24
150.0000 mg | ORAL_CAPSULE | Freq: Every day | ORAL | Status: DC
  Administered 2015-03-26 – 2015-04-01 (×7): 150 mg via ORAL
  Filled 2015-03-26 (×10): qty 1

## 2015-03-26 MED ORDER — AZELASTINE-FLUTICASONE 137-50 MCG/ACT NA SUSP: 1.0000 | Freq: Two times a day (BID) | NASAL | Status: DC

## 2015-03-26 MED ORDER — ALBUTEROL SULFATE (2.5 MG/3ML) 0.083% IN NEBU
2.5000 mg | INHALATION_SOLUTION | RESPIRATORY_TRACT | Status: DC | PRN
Start: 2015-03-26 — End: 2015-04-01

## 2015-03-26 MED ORDER — ENOXAPARIN SODIUM 40 MG/0.4ML ~~LOC~~ SOLN
40.0000 mg | SUBCUTANEOUS | Status: DC
  Administered 2015-03-26: 40 mg via SUBCUTANEOUS
  Filled 2015-03-26: qty 0.4

## 2015-03-26 MED ORDER — THEOPHYLLINE ER 300 MG PO TB12
300.0000 mg | ORAL_TABLET | Freq: Two times a day (BID) | ORAL | Status: DC
  Administered 2015-03-26 – 2015-04-01 (×13): 300 mg via ORAL
  Filled 2015-03-26 (×15): qty 1

## 2015-03-26 MED ORDER — AZELASTINE HCL 0.1 % NA SOLN
1.0000 | Freq: Two times a day (BID) | NASAL | Status: DC
  Administered 2015-03-26 – 2015-03-31 (×8): 1 via NASAL
  Filled 2015-03-26: qty 30

## 2015-03-26 MED ORDER — PANTOPRAZOLE SODIUM 40 MG PO TBEC
40.0000 mg | DELAYED_RELEASE_TABLET | Freq: Every day | ORAL | Status: DC
  Administered 2015-03-26 – 2015-04-01 (×7): 40 mg via ORAL
  Filled 2015-03-26 (×7): qty 1

## 2015-03-26 MED ORDER — METHYLPREDNISOLONE SODIUM SUCC 125 MG IJ SOLR
60.0000 mg | Freq: Four times a day (QID) | INTRAMUSCULAR | Status: DC
  Administered 2015-03-26 – 2015-03-27 (×5): 60 mg via INTRAVENOUS
  Filled 2015-03-26 (×5): qty 2

## 2015-03-26 MED ORDER — ADULT MULTIVITAMIN W/MINERALS CH
1.0000 | ORAL_TABLET | Freq: Every day | ORAL | Status: DC
  Administered 2015-03-26 – 2015-04-01 (×7): 1 via ORAL
  Filled 2015-03-26 (×7): qty 1

## 2015-03-26 MED ORDER — INSULIN ASPART 100 UNIT/ML ~~LOC~~ SOLN: 0.0000 [IU] | Freq: Every day | SUBCUTANEOUS | Status: DC

## 2015-03-26 MED ORDER — LORAZEPAM 2 MG/ML IJ SOLN
2.0000 mg | INTRAMUSCULAR | Status: DC | PRN
  Administered 2015-03-26: 3 mg via INTRAVENOUS
  Administered 2015-03-26: 2 mg via INTRAVENOUS
  Administered 2015-03-26 – 2015-03-27 (×3): 3 mg via INTRAVENOUS
  Filled 2015-03-26: qty 2
  Filled 2015-03-26: qty 1
  Filled 2015-03-26 (×3): qty 2

## 2015-03-26 MED ORDER — FOLIC ACID 1 MG PO TABS
1.0000 mg | ORAL_TABLET | Freq: Every day | ORAL | Status: DC
  Administered 2015-03-26 – 2015-03-27 (×2): 1 mg via ORAL
  Filled 2015-03-26 (×2): qty 1

## 2015-03-26 MED ORDER — ENSURE ENLIVE PO LIQD
237.0000 mL | Freq: Two times a day (BID) | ORAL | Status: DC
  Administered 2015-03-26 – 2015-03-31 (×9): 237 mL via ORAL

## 2015-03-26 MED ORDER — ARFORMOTEROL TARTRATE 15 MCG/2ML IN NEBU
15.0000 ug | INHALATION_SOLUTION | Freq: Two times a day (BID) | RESPIRATORY_TRACT | Status: DC
  Administered 2015-03-26 – 2015-03-31 (×11): 15 ug via RESPIRATORY_TRACT
  Filled 2015-03-26 (×17): qty 2

## 2015-03-26 NOTE — Care Management Note (Signed)
Case Management Note  Patient Details  Name: ANWAR CRILL MRN: [id number] Date of Birth: 05/29/1951  Subjective/Objective:         resp depression and hypoxia           Action/Plan:Date:  Oct. 06, 2016 U.R. performed for needs and level of care. Will continue to follow for Case Management needs.  Velva Harman, RN, BSN, [place]   [phone]   Expected Discharge Date:   (unknown)               Expected Discharge Plan:  Home/Self Care  In-House Referral:  Clinical Social Work  Discharge planning Services  CM Consult  Post Acute Care Choice:    Choice offered to:     DME Arranged:    DME Agency:     HH Arranged:    [place] Agency:     Status of Service:  In process, will continue to follow  Medicare Important Message Given:    Date Medicare IM Given:    Medicare IM give by:    Date Additional Medicare IM Given:    Additional Medicare Important Message give by:     If discussed at [place] of Stay Meetings, dates discussed:    Additional Comments:  Leeroy Cha, RN 03/26/2015, 10:48 AM

## 2015-03-26 NOTE — Progress Notes (Signed)
Notified by RN that patient is experiencing increasing symptoms of withdrawal from benzodiazepines.  Will increase CIWA and prn ativan to q1h.  Also, she is becoming hyperglycemic from her steroids.  Will add some standing aspart with meals.  Will likely need additional coverage, but will see what CBG is in AM.

## 2015-03-26 NOTE — Progress Notes (Addendum)
[facility] PROGRESS NOTE  STEPHANNE Thomas XAJ:[id number] DOB: 1951/01/21 DOA: 03/25/2015 PCP: [name], NP  Brief Summary  The patient is a 64 y.o. year-old female with history of COPD on 3L [place] with ongoing smoking, benzodiazepine dependence, chronic pain on chronic narcotics, depression, diabetes mellitus type 2, bladder spasms who presented with mechanical fall and increased SOB.She tripped over a portable toilet and hit her head, her left arm, and landed on her hip. She came to the ER because of her fall. Incidentally, she reported increased SOB with productive cough. She denied fevers. She wears 3L home oxygen which she removes intermittently to smoke.  Her O2 sat was 78% on her home 3L.  Bibasilar pleural thickening on CXR. XR of the left formearm and pelvis were wnl. CT head and cervical spine demionstrated no acute injury but moderate diffuse cortical atrophy and moderate chronic ischemic white matter disease. She was given Duoneb, Magnesium, solumedrol, ativan, zofran, and ceftriaxone. She had significant tremors in ER which she attributed to not taking her xanax today. She does not take her xanax as prescribed. She reported taking 2 tablets with breakfast and then taking additional doses throughout the day as needed, up to four times per day. She was being admitted to stepdown due to current somnolence and confusion in the setting of possible benzodiazepine withdrawal and COPD exacerbation.   Assessment/Plan  Somnolence and delirium, possibly due to concussion, overmedication with multiple sedating medications, acute hypoxia due to COPD exacerbation. Mentation much better this morning after holding sedating medications - continue to hold narcotics and use toradol as needed for pain instead - Judicious use of benzodiazepines - Hold oxybutynin - Ethanol level neg and UDS c/w with home medications - Check ammonia level wnl  Acute on chronic hypoxic respiratory  failure secondary to acute COPD exacerbation.   - Continue solumedrol 60mg  IV q6h - Duonebs q4h with albuterol q2h prn - Start budesonide and brovana - Continue theophylline.  - Continue ceftriaxone  Myoclonus/tremor improved this morning - Theophylline level low  - CIWA protocol  Mechanical fall  - No evidence of fracture or head/neck trauma  Diabetes mellitus type 2 - A1c pending - Hold metformin - Continue low dose SSI and monitor for hyperglycemia  Depression, stable, continue pristiq  Prolonged QTc. Avoid QTc prolonging medications.   Diet: diabetic diet Access: PIV IVF: yes Proph: lovenox  Code Status: full Family Communication: patient alone Disposition Plan:  Anticipate eventual discharge to home  Consultants:  None  Procedures:  CXR  Antibiotics:  Ceftriaxone 10/5 >    HPI/Subjective:  States that she continues to have some tightness in her chest with wheezing, but overall feels better this morning.  Denies nausea, vomiting, diarrhea.      Objective: Filed Vitals:   03/26/15 0800 03/26/15 0812 03/26/15 1017 03/26/15 1200  BP:  180/97 142/74 152/77  Pulse:  99 108 105  Temp: 99 F (37.2 C)   98.9 F (37.2 C)  TempSrc: Oral   Oral  Resp:  22 16 21   Height:      Weight:      SpO2:  94% 90% 93%    Intake/Output Summary (Last 24 hours) at 03/26/15 1258 Last data filed at 03/26/15 1200  Gross per 24 hour  Intake 913.33 ml  Output      0 ml  Net 913.33 ml   Filed Weights   03/25/15 1720  Weight: 40.1 kg (88 lb 6.5 oz)   Body mass index is  17.56 kg/(m^2).  Exam:   General:  Cachectic female, No acute distress  HEENT:  NCAT, MMM  Cardiovascular:  RRR, nl S1, S2 no mrg, 2+ pulses, warm extremities  Respiratory:  Very diminished bilateral BS, no increased WOB  Abdomen:   NABS, soft, NT/ND  MSK:   Normal tone and bulk, no LEE  Neuro:  Grossly intact  Data Reviewed: Basic Metabolic Panel:  Recent Labs Lab  03/25/15 1318 03/26/15 0800  NA 139 139  K 3.9 3.6  CL 97* 99*  CO2 30 32  GLUCOSE 167* 118*  BUN 11 15  CREATININE 0.62 0.70  CALCIUM 9.6 9.3   Liver Function Tests:  Recent Labs Lab 03/25/15 1318  AST 14*  ALT 9*  ALKPHOS 103  BILITOT 0.3  PROT 7.5  ALBUMIN 4.7    Recent Labs Lab 03/25/15 1318  LIPASE 22    Recent Labs Lab 03/25/15 1835  AMMONIA 15   CBC:  Recent Labs Lab 03/25/15 1318 03/26/15 0800  WBC 7.0 9.1  NEUTROABS 5.9  --   HGB 14.4 11.8*  HCT 44.8 37.2  MCV 92.9 93.9  PLT 235 226    Recent Results (from the past 240 hour(s))  Blood culture (routine x 2)     Status: None (Preliminary result)   Collection Time: 03/25/15  3:23 PM  Result Value Ref Range Status   Specimen Description BLOOD LEFT HAND  Final   Special Requests BOTTLES DRAWN AEROBIC AND ANAEROBIC 5CC  Final   Culture   Final    NO GROWTH < 24 HOURS Performed at [facility]    Report Status PENDING  Incomplete  Blood culture (routine x 2)     Status: None (Preliminary result)   Collection Time: 03/25/15  6:35 PM  Result Value Ref Range Status   Specimen Description BLOOD LEFT ANTECUBITAL  Final   Special Requests BOTTLES DRAWN AEROBIC ONLY 4CC  Final   Culture   Final    NO GROWTH < 24 HOURS Performed at [facility]    Report Status PENDING  Incomplete  MRSA PCR Screening     Status: None   Collection Time: 03/25/15 10:30 PM  Result Value Ref Range Status   MRSA by PCR NEGATIVE NEGATIVE Final    Comment:        The GeneXpert MRSA Assay (FDA approved for NASAL specimens only), is one component of a comprehensive MRSA colonization surveillance program. It is not intended to diagnose MRSA infection nor to guide or monitor treatment for MRSA infections.      Studies: Dg Pelvis 1-2 Views  03/25/2015   CLINICAL DATA:  Acute pelvic pain after fall in bathroom today.  EXAM: PELVIS - 1-2 VIEW  COMPARISON:  March 30, 2012.  FINDINGS: There is no  evidence of pelvic fracture or diastasis. No pelvic bone lesions are seen. Hip and sacroiliac joints appear normal.  IMPRESSION: Normal pelvis.   Electronically Signed   By: Marijo Conception, M.D.   On: 03/25/2015 14:47   Dg Forearm Left  03/25/2015   CLINICAL DATA:  Fall.  EXAM: LEFT FOREARM - 2 VIEW  COMPARISON:  None.  FINDINGS: Diffuse osteopenia. Degenerative change noted about the left wrist and elbow. No acute bony or joint abnormality.  IMPRESSION: Negative.   Electronically Signed   By: [place]   On: 03/25/2015 14:47   Ct Head Wo Contrast  03/25/2015   CLINICAL DATA:  Posttraumatic headaches and left-sided neck pain  after fall this morning.  EXAM: CT HEAD WITHOUT CONTRAST  CT CERVICAL SPINE WITHOUT CONTRAST  TECHNIQUE: Multidetector CT imaging of the head and cervical spine was performed following the standard protocol without intravenous contrast. Multiplanar CT image reconstructions of the cervical spine were also generated.  COMPARISON:  None.  FINDINGS: CT HEAD FINDINGS  Bony calvarium appears intact. Moderate diffuse cortical atrophy is noted. Moderate chronic ischemic white matter disease is noted. 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 No mass effect or midline shift is noted. Ventricular size is within normal limits. There is no evidence of mass lesion, hemorrhage or acute infarction.  CT CERVICAL SPINE FINDINGS  No fracture or spondylolisthesis is noted. Disc spaces and posterior facet joints appear intact. Visualized lung apices appear normal.  IMPRESSION: Moderate diffuse cortical atrophy. Moderate chronic ischemic white matter disease. No acute intracranial abnormality seen.  No significant abnormality seen in the cervical spine.   Electronically Signed   By: Marijo Conception, M.D.   On: 03/25/2015 15:15   Ct Cervical Spine Wo Contrast  03/25/2015   CLINICAL DATA:  Posttraumatic headaches and left-sided neck pain after fall this morning.  EXAM: CT HEAD WITHOUT  CONTRAST  CT CERVICAL SPINE WITHOUT CONTRAST  TECHNIQUE: Multidetector CT imaging of the head and cervical spine was performed following the standard protocol without intravenous contrast. Multiplanar CT image reconstructions of the cervical spine were also generated.  COMPARISON:  None.  FINDINGS: CT HEAD FINDINGS  Bony calvarium appears intact. Moderate diffuse cortical atrophy is noted. Moderate chronic ischemic white matter disease is noted. 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 No mass effect or midline shift is noted. Ventricular size is within normal limits. There is no evidence of mass lesion, hemorrhage or acute infarction.  CT CERVICAL SPINE FINDINGS  No fracture or spondylolisthesis is noted. Disc spaces and posterior facet joints appear intact. Visualized lung apices appear normal.  IMPRESSION: Moderate diffuse cortical atrophy. Moderate chronic ischemic white matter disease. No acute intracranial abnormality seen.  No significant abnormality seen in the cervical spine.   Electronically Signed   By: Marijo Conception, M.D.   On: 03/25/2015 15:15   Dg Chest Port 1 View  03/25/2015   CLINICAL DATA:  Shortness of breath.  Fall.  EXAM: PORTABLE CHEST 1 VIEW  COMPARISON:  01/18/2014.  01/16/2015.  06/27/2014.  FINDINGS: Mediastinum and hilar structures are normal. Bibasilar pleural parenchymal thickening again noted consistent with scarring. Heart size normal. No pleural effusion or pneumothorax. No acute bony abnormality .  IMPRESSION: Bibasilar pleural parenchymal thickening noted consistent with scarring.   Electronically Signed   By: Marcello Moores  Register   On: 03/25/2015 13:36    Scheduled Meds: . arformoterol  15 mcg Nebulization BID  . azelastine  1 spray Each Nare BID   And  . fluticasone  1 spray Each Nare BID  . budesonide (PULMICORT) nebulizer solution  0.25 mg Nebulization BID  . cefTRIAXone (ROCEPHIN)  IV  1 g Intravenous Q24H  . enoxaparin (LOVENOX) injection  40 mg  Subcutaneous Q24H  . folic acid  1 mg Oral Daily  . insulin aspart  0-5 Units Subcutaneous QHS  . insulin aspart  0-9 Units Subcutaneous TID WC  . ipratropium-albuterol  3 mL Nebulization QID  . LORazepam  0-4 mg Oral Q6H   Followed by  . [START ON 03/28/2015] LORazepam  0-4 mg Oral Q12H  . methylPREDNISolone (SOLU-MEDROL) injection  60 mg Intravenous Q6H  . multivitamin with  minerals  1 tablet Oral Daily  . pantoprazole  40 mg Oral Daily  . theophylline  300 mg Oral Q12H  . thiamine  100 mg Oral Daily   Or  . thiamine  100 mg Intravenous Daily  . venlafaxine XR  150 mg Oral Q breakfast   Continuous Infusions: . 0.9 % NaCl with KCl 20 mEq / L 100 mL/hr at 03/26/15 0740    Active Problems:   COPD exacerbation (HCC)   Acute respiratory failure with hypoxia (HCC)   Somnolence   Acute delirium   Diabetes mellitus type 2 in nonobese [facility])   Depression    Time spent: 30 min    Jonatan Wilsey, [facility] Pager [phone]. If 7PM-7AM, please contact night-coverage at [email or web address], password [facility] 03/26/2015, 12:58 PM  LOS: 1 day

## 2015-03-26 NOTE — Progress Notes (Signed)
Initial Nutrition Assessment  DOCUMENTATION CODES:   Underweight  INTERVENTION:   Provide Ensure Enlive po BID, each supplement provides 350 kcal and 20 grams of protein Encourage PO intake RD to continue to monitor  NUTRITION DIAGNOSIS:   Increased nutrient needs related to  (COPD) as evidenced by estimated needs.  GOAL:   Patient will meet greater than or equal to 90% of their needs  MONITOR:   PO intake, Supplement acceptance, Labs, Weight trends, Skin, I & O's  REASON FOR ASSESSMENT:   Malnutrition Screening Tool    ASSESSMENT:   64 y.o. year-old female with history of COPD on 3L [place] with ongoing smoking, benzodiazepine dependence, chronic pain on chronic narcotics, depression, diabetes mellitus type 2, bladder spasms who presented with mechanical fall and increased SOB.She tripped over a portable toilet and hit her head, her left arm, and landed on her hip.  Pt reports N/V this past week and not eating well. Pt states that she does drink Ensures when she can afford them. Pt currently eating ~75% of her meals. RD to order Ensures BID. Pt states her UBW is around 85-90 lb. Pt is underweight.  Nutrition-Focused physical exam completed. Findings are moderate fat depletion, no muscle depletion, and no edema.   Labs reviewed.  Diet Order:  Diet Carb Modified Fluid consistency:: Thin; Room service appropriate?: Yes  Skin:  Reviewed, no issues  Last BM:  PTA  Height:   Ht Readings from Last 1 Encounters:  03/25/15 4' 11.5" (1.511 m)    Weight:   Wt Readings from Last 1 Encounters:  03/25/15 88 lb 6.5 oz (40.1 kg)    Ideal Body Weight:  44.8 kg  BMI:  Body mass index is 17.56 kg/(m^2).  Estimated Nutritional Needs:   Kcal:  1300-1500  Protein:  65-75g  Fluid:  1.5L/day  EDUCATION NEEDS:   Education needs addressed  Clayton Bibles, MS, RD, LDN Pager: [phone] After Hours Pager: [phone]

## 2015-03-26 NOTE — Clinical Documentation Improvement (Signed)
Internal Medicine  Can the acuity of the currently diagnosed Acute Hypoxic Respiratory Failure with Acute COPD exacerbation  be further specified?   Acute on chronic hypoxic respiratory failure  Other  Clinically Undetermined  Document any associated diagnoses/conditions.   Supporting Information: Chart also reflects patient on 3L home oxygen.     Please exercise your independent, professional judgment when responding. A specific answer is not anticipated or expected.  Thank you, Mateo Flow, RN [phone] Clinical Documentation Specialist

## 2015-03-27 DIAGNOSIS — J9621 Acute and chronic respiratory failure with hypoxia: Secondary | ICD-10-CM

## 2015-03-27 LAB — BASIC METABOLIC PANEL
Anion gap: 6 (ref 5–15)
BUN: 21 mg/dL — ABNORMAL HIGH (ref 6–20)
CHLORIDE: 105 mmol/L (ref 101–111)
CO2: 31 mmol/L (ref 22–32)
Calcium: 9.3 mg/dL (ref 8.9–10.3)
Creatinine, Ser: 0.68 mg/dL (ref 0.44–1.00)
GFR calc Af Amer: >60 mL/min (ref >60)
GFR calc non Af Amer: >60 mL/min (ref >60)
GLUCOSE: 151 mg/dL — AB (ref 65–99)
POTASSIUM: 4.1 mmol/L (ref 3.5–5.1)
Sodium: 142 mmol/L (ref 135–145)

## 2015-03-27 LAB — GLUCOSE, CAPILLARY
GLUCOSE-CAPILLARY: 155 mg/dL — AB (ref 65–99)
GLUCOSE-CAPILLARY: 233 mg/dL — AB (ref 65–99)
GLUCOSE-CAPILLARY: 130 mg/dL — AB (ref 65–99)
Glucose-Capillary: 154 mg/dL — ABNORMAL HIGH (ref 65–99)
Glucose-Capillary: 146 mg/dL — ABNORMAL HIGH (ref 65–99)

## 2015-03-27 LAB — CBC
HEMATOCRIT: 34.1 % — AB (ref 36.0–46.0)
Hemoglobin: 10.6 g/dL — ABNORMAL LOW (ref 12.0–15.0)
MCH: 29.2 pg (ref 26.0–34.0)
MCHC: 31.1 g/dL (ref 30.0–36.0)
MCV: 93.9 fL (ref 78.0–100.0)
Platelets: 200 10*3/uL (ref 150–400)
RBC: 3.63 MIL/uL — ABNORMAL LOW (ref 3.87–5.11)
RDW: 15.3 % (ref 11.5–15.5)
WBC: 9.1 10*3/uL (ref 4.0–10.5)

## 2015-03-27 LAB — HEMOGLOBIN A1C
Hgb A1c MFr Bld: 6.5 % — ABNORMAL HIGH (ref 4.8–5.6)
Mean Plasma Glucose: 140 mg/dL

## 2015-03-27 MED ORDER — LORAZEPAM 1 MG PO TABS
0.0000 mg | ORAL_TABLET | Freq: Two times a day (BID) | ORAL | Status: DC
Start: 2015-03-29 — End: 2015-03-29

## 2015-03-27 MED ORDER — NICOTINE 21 MG/24HR TD PT24
21.0000 mg | MEDICATED_PATCH | Freq: Every day | TRANSDERMAL | Status: DC
  Administered 2015-03-27 – 2015-04-01 (×6): 21 mg via TRANSDERMAL
  Filled 2015-03-27 (×6): qty 1

## 2015-03-27 MED ORDER — LORAZEPAM 1 MG PO TABS
0.0000 mg | ORAL_TABLET | Freq: Four times a day (QID) | ORAL | Status: DC
  Administered 2015-03-27: 2 mg via ORAL
  Administered 2015-03-28: 1 mg via ORAL
  Administered 2015-03-28: 2 mg via ORAL
  Administered 2015-03-28: 1 mg via ORAL
  Filled 2015-03-27: qty 2
  Filled 2015-03-27: qty 1
  Filled 2015-03-27: qty 2

## 2015-03-27 MED ORDER — QUETIAPINE FUMARATE 25 MG PO TABS
25.0000 mg | ORAL_TABLET | Freq: Every day | ORAL | Status: DC
  Administered 2015-03-27 – 2015-03-31 (×5): 25 mg via ORAL
  Filled 2015-03-27 (×6): qty 1

## 2015-03-27 MED ORDER — POLYETHYLENE GLYCOL 3350 17 G PO PACK
17.0000 g | PACK | Freq: Every day | ORAL | Status: DC
  Administered 2015-03-27 – 2015-03-30 (×3): 17 g via ORAL
  Filled 2015-03-27 (×6): qty 1

## 2015-03-27 MED ORDER — LORAZEPAM 1 MG PO TABS
1.0000 mg | ORAL_TABLET | Freq: Four times a day (QID) | ORAL | Status: DC | PRN
  Administered 2015-03-29: 1 mg via ORAL
  Filled 2015-03-27 (×2): qty 1

## 2015-03-27 MED ORDER — SENNA 8.6 MG PO TABS
2.0000 | ORAL_TABLET | Freq: Every day | ORAL | Status: DC
  Filled 2015-03-27 (×3): qty 2

## 2015-03-27 MED ORDER — LORAZEPAM 2 MG/ML IJ SOLN
1.0000 mg | Freq: Four times a day (QID) | INTRAMUSCULAR | Status: DC | PRN
  Administered 2015-03-27: 1 mg via INTRAVENOUS
  Filled 2015-03-27: qty 1

## 2015-03-27 MED ORDER — ALPRAZOLAM 0.5 MG PO TABS
0.5000 mg | ORAL_TABLET | Freq: Three times a day (TID) | ORAL | Status: DC
  Administered 2015-03-27 – 2015-04-01 (×16): 0.5 mg via ORAL
  Filled 2015-03-27 (×16): qty 1

## 2015-03-27 MED ORDER — HYDROCODONE-ACETAMINOPHEN 5-325 MG PO TABS
1.0000 | ORAL_TABLET | Freq: Three times a day (TID) | ORAL | Status: DC | PRN
  Administered 2015-03-28 – 2015-03-31 (×5): 1 via ORAL
  Filled 2015-03-27 (×5): qty 1

## 2015-03-27 MED ORDER — DOCUSATE SODIUM 100 MG PO CAPS
100.0000 mg | ORAL_CAPSULE | Freq: Two times a day (BID) | ORAL | Status: DC
Start: 2015-03-27 — End: 2015-03-31
  Administered 2015-03-27 – 2015-03-30 (×7): 100 mg via ORAL
  Filled 2015-03-27 (×11): qty 1

## 2015-03-27 MED ORDER — METOPROLOL TARTRATE 1 MG/ML IV SOLN
5.0000 mg | Freq: Four times a day (QID) | INTRAVENOUS | Status: DC | PRN
  Administered 2015-03-27: 5 mg via INTRAVENOUS
  Filled 2015-03-27: qty 5

## 2015-03-27 MED ORDER — HYDROCODONE-ACETAMINOPHEN 5-325 MG PO TABS
1.0000 | ORAL_TABLET | Freq: Two times a day (BID) | ORAL | Status: DC
  Administered 2015-03-27 – 2015-03-29 (×5): 1 via ORAL
  Filled 2015-03-27 (×9): qty 1

## 2015-03-27 MED ORDER — ENOXAPARIN SODIUM 30 MG/0.3ML ~~LOC~~ SOLN
20.0000 mg | SUBCUTANEOUS | Status: DC
  Administered 2015-03-27 – 2015-03-31 (×5): 20 mg via SUBCUTANEOUS
  Filled 2015-03-27 (×8): qty 0.2

## 2015-03-27 MED ORDER — METHYLPREDNISOLONE SODIUM SUCC 125 MG IJ SOLR
60.0000 mg | Freq: Two times a day (BID) | INTRAMUSCULAR | Status: DC
  Administered 2015-03-27 – 2015-03-30 (×6): 60 mg via INTRAVENOUS
  Filled 2015-03-27 (×7): qty 0.96

## 2015-03-27 NOTE — Progress Notes (Signed)
Pt transferred to 1515 via bed with all belongings. [name] (son) called and updated. Report given to Abla, RN and all questions answered. Pt HR in 120s-130s. Dr. Sheran Fava notified and lopressor ordered.

## 2015-03-27 NOTE — Progress Notes (Signed)
Pharmacy: Lovenox for VTE prophylaxis  Patient's a 64 y.o F currently on lovenox 40mg  SQ q24h for VTE prophylaxis. - weight 40 kg, crcl~46 - hgb down 10.6, plt wnl - no bleeding documented  Plan: - change lovenox dose to 20 mg SQ q24h for low weight  Dia Sitter, PharmD, BCPS 03/27/2015 7:21 AM

## 2015-03-27 NOTE — Progress Notes (Addendum)
[facility] PROGRESS NOTE  Traci Thomas CNO:[id number] DOB: 09/27/1950 DOA: 03/25/2015 PCP: [name], NP  Brief Summary  The patient is a 64 y.o. year-old female with history of COPD on 3L [place] with ongoing smoking, benzodiazepine dependence, chronic pain on chronic narcotics, depression, diabetes mellitus type 2, bladder spasms who presented with mechanical fall and increased SOB.She tripped over a portable toilet and hit her head, her left arm, and landed on her hip. She came to the ER because of her fall. Incidentally, she reported increased SOB with productive cough. She denied fevers. She wears 3L home oxygen which she removes intermittently to smoke.  Her O2 sat was 78% on her home 3L.  Bibasilar pleural thickening on CXR. XR of the left formearm and pelvis were wnl. CT head and cervical spine demionstrated no acute injury but moderate diffuse cortical atrophy and moderate chronic ischemic white matter disease. She was given Duoneb, Magnesium, solumedrol, ativan, zofran, and ceftriaxone. She had significant tremors in ER which she attributed to not taking her xanax today. She does not take her xanax as prescribed. She reported taking 2 tablets with breakfast and then taking additional doses throughout the day as needed, up to four times per day. She was being admitted to stepdown due to current somnolence and confusion in the setting of possible benzodiazepine withdrawal and COPD exacerbation.   Assessment/Plan  Somnolence and delirium, possibly due to concussion, overmedication with multiple sedating medications, acute hypoxia due to COPD exacerbation. was very confused and agitated last night requiring restraints and a total of 16mg  of ativan in the last 24 hours which is probably due to benzo and narcotic withdrawal - resume narcotics prn at lower dose with scheduled hydrocodone 5-325mg  BID - continue benzodiazepines per CIWA protocol but resume some scheduled xanax at  lower than home dose - Hold oxybutynin - Ethanol level neg and UDS c/w with home medications - Ammonia level wnl  Acute on chronic hypoxic respiratory failure secondary to acute COPD exacerbation.   - Change to solumedrol 60mg  IV q12h - Duonebs q4h with albuterol q2h prn - Continue budesonide and brovana - Continue theophylline.  - Continue ceftriaxone  Myoclonus/tremor present again this morning - Theophylline level low  - CIWA protocol  Mechanical fall  - No evidence of fracture or head/neck trauma  Diabetes mellitus type 2 - A1c 6.5 - Hold metformin - Continue low dose SSI and monitor for hyperglycemia  Depression, stable, continue pristiq  Prolonged QTc. Avoid QTc prolonging medications.   Diet: diabetic diet Access: PIV IVF: off Proph: lovenox  Code Status: full Family Communication: patient alone Disposition Plan:  Anticipate eventual discharge to home.  PT eval.    Consultants:  None  Procedures:  CXR  Antibiotics:  Ceftriaxone 10/5 >    HPI/Subjective:  Very confused overnight and required multiple doses of ativan for withdrawal symptoms.  Tachycardic resolved with ativan.  Still SOB today with chest tightness, but O2 down to 5L after wearing venti mask most of day yesterday.  Somewhat sedated this morning.  Ate some yesterday but had nausea with vomiting twice.  No BM.  Perseverated about MD appointment.     Objective: Filed Vitals:   03/27/15 0300 03/27/15 0400 03/27/15 0500 03/27/15 0600  BP:  144/83  149/78  Pulse: 95 91 89 86  Temp:      TempSrc:      Resp: 26 27 27 26   Height:      Weight:  SpO2: 97% 99% 99% 98%    Intake/Output Summary (Last 24 hours) at 03/27/15 0805 Last data filed at 03/27/15 0300  Gross per 24 hour  Intake 1403.33 ml  Output      0 ml  Net 1403.33 ml   Filed Weights   03/25/15 1720  Weight: 40.1 kg (88 lb 6.5 oz)   Body mass index is 17.56 kg/(m^2).  Exam:   General:   Cachectic female, No acute distress, asleep with posey vest  HEENT:  NCAT, MMM  Cardiovascular:  RRR, nl S1, S2 no mrg, 2+ pulses, warm extremities  Respiratory:  Very diminished bilateral BS, no increased WOB  Abdomen:   NABS, soft, mildly distended and gassy, mild TTP in epigastrium without rebound or guarding  MSK:   Normal tone and bulk, no LEE  Neuro:  Grossly intact  Data Reviewed: Basic Metabolic Panel:  Recent Labs Lab 03/25/15 1318 03/26/15 0800 03/27/15 0335  NA 139 139 142  K 3.9 3.6 4.1  CL 97* 99* 105  CO2 30 32 31  GLUCOSE 167* 118* 151*  BUN 11 15 21*  CREATININE 0.62 0.70 0.68  CALCIUM 9.6 9.3 9.3   Liver Function Tests:  Recent Labs Lab 03/25/15 1318  AST 14*  ALT 9*  ALKPHOS 103  BILITOT 0.3  PROT 7.5  ALBUMIN 4.7    Recent Labs Lab 03/25/15 1318  LIPASE 22    Recent Labs Lab 03/25/15 1835  AMMONIA 15   CBC:  Recent Labs Lab 03/25/15 1318 03/26/15 0800 03/27/15 0335  WBC 7.0 9.1 9.1  NEUTROABS 5.9  --   --   HGB 14.4 11.8* 10.6*  HCT 44.8 37.2 34.1*  MCV 92.9 93.9 93.9  PLT 235 226 200    Recent Results (from the past 240 hour(s))  Blood culture (routine x 2)     Status: None (Preliminary result)   Collection Time: 03/25/15  3:23 PM  Result Value Ref Range Status   Specimen Description BLOOD LEFT HAND  Final   Special Requests BOTTLES DRAWN AEROBIC AND ANAEROBIC 5CC  Final   Culture   Final    NO GROWTH < 24 HOURS Performed at [facility]    Report Status PENDING  Incomplete  Blood culture (routine x 2)     Status: None (Preliminary result)   Collection Time: 03/25/15  6:35 PM  Result Value Ref Range Status   Specimen Description BLOOD LEFT ANTECUBITAL  Final   Special Requests BOTTLES DRAWN AEROBIC ONLY 4CC  Final   Culture   Final    NO GROWTH < 24 HOURS Performed at [facility]    Report Status PENDING  Incomplete  MRSA PCR Screening     Status: None   Collection Time: 03/25/15 10:30 PM   Result Value Ref Range Status   MRSA by PCR NEGATIVE NEGATIVE Final    Comment:        The GeneXpert MRSA Assay (FDA approved for NASAL specimens only), is one component of a comprehensive MRSA colonization surveillance program. It is not intended to diagnose MRSA infection nor to guide or monitor treatment for MRSA infections.      Studies: Dg Pelvis 1-2 Views  03/25/2015   CLINICAL DATA:  Acute pelvic pain after fall in bathroom today.  EXAM: PELVIS - 1-2 VIEW  COMPARISON:  March 30, 2012.  FINDINGS: There is no evidence of pelvic fracture or diastasis. No pelvic bone lesions are seen. Hip and sacroiliac joints appear normal.  IMPRESSION: Normal pelvis.   Electronically Signed   By: Marijo Conception, M.D.   On: 03/25/2015 14:47   Dg Forearm Left  03/25/2015   CLINICAL DATA:  Fall.  EXAM: LEFT FOREARM - 2 VIEW  COMPARISON:  None.  FINDINGS: Diffuse osteopenia. Degenerative change noted about the left wrist and elbow. No acute bony or joint abnormality.  IMPRESSION: Negative.   Electronically Signed   By: Marcello Moores  Register   On: 03/25/2015 14:47   Ct Head Wo Contrast  03/25/2015   CLINICAL DATA:  Posttraumatic headaches and left-sided neck pain after fall this morning.  EXAM: CT HEAD WITHOUT CONTRAST  CT CERVICAL SPINE WITHOUT CONTRAST  TECHNIQUE: Multidetector CT imaging of the head and cervical spine was performed following the standard protocol without intravenous contrast. Multiplanar CT image reconstructions of the cervical spine were also generated.  COMPARISON:  None.  FINDINGS: CT HEAD FINDINGS  Bony calvarium appears intact. Moderate diffuse cortical atrophy is noted. Moderate chronic ischemic white matter disease is noted. 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 No mass effect or midline shift is noted. Ventricular size is within normal limits. There is no evidence of mass lesion, hemorrhage or acute infarction.  CT CERVICAL SPINE FINDINGS  No fracture or  spondylolisthesis is noted. Disc spaces and posterior facet joints appear intact. Visualized lung apices appear normal.  IMPRESSION: Moderate diffuse cortical atrophy. Moderate chronic ischemic white matter disease. No acute intracranial abnormality seen.  No significant abnormality seen in the cervical spine.   Electronically Signed   By: Marijo Conception, M.D.   On: 03/25/2015 15:15   Ct Cervical Spine Wo Contrast  03/25/2015   CLINICAL DATA:  Posttraumatic headaches and left-sided neck pain after fall this morning.  EXAM: CT HEAD WITHOUT CONTRAST  CT CERVICAL SPINE WITHOUT CONTRAST  TECHNIQUE: Multidetector CT imaging of the head and cervical spine was performed following the standard protocol without intravenous contrast. Multiplanar CT image reconstructions of the cervical spine were also generated.  COMPARISON:  None.  FINDINGS: CT HEAD FINDINGS  Bony calvarium appears intact. Moderate diffuse cortical atrophy is noted. Moderate chronic ischemic white matter disease is noted. 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 No mass effect or midline shift is noted. Ventricular size is within normal limits. There is no evidence of mass lesion, hemorrhage or acute infarction.  CT CERVICAL SPINE FINDINGS  No fracture or spondylolisthesis is noted. Disc spaces and posterior facet joints appear intact. Visualized lung apices appear normal.  IMPRESSION: Moderate diffuse cortical atrophy. Moderate chronic ischemic white matter disease. No acute intracranial abnormality seen.  No significant abnormality seen in the cervical spine.   Electronically Signed   By: Marijo Conception, M.D.   On: 03/25/2015 15:15   Dg Chest Port 1 View  03/25/2015   CLINICAL DATA:  Shortness of breath.  Fall.  EXAM: PORTABLE CHEST 1 VIEW  COMPARISON:  01/18/2014.  01/16/2015.  06/27/2014.  FINDINGS: Mediastinum and hilar structures are normal. Bibasilar pleural parenchymal thickening again noted consistent with scarring. Heart size  normal. No pleural effusion or pneumothorax. No acute bony abnormality .  IMPRESSION: Bibasilar pleural parenchymal thickening noted consistent with scarring.   Electronically Signed   By: Marcello Moores  Register   On: 03/25/2015 13:36    Scheduled Meds: . arformoterol  15 mcg Nebulization BID  . azelastine  1 spray Each Nare BID   And  . fluticasone  1 spray Each Nare BID  . budesonide (PULMICORT) nebulizer  solution  0.25 mg Nebulization BID  . cefTRIAXone (ROCEPHIN)  IV  1 g Intravenous Q24H  . enoxaparin (LOVENOX) injection  20 mg Subcutaneous Q24H  . feeding supplement (ENSURE ENLIVE)  237 mL Oral BID BM  . folic acid  1 mg Oral Daily  . insulin aspart  0-5 Units Subcutaneous QHS  . insulin aspart  0-9 Units Subcutaneous TID WC  . insulin aspart  3 Units Subcutaneous TID WC  . ipratropium-albuterol  3 mL Nebulization QID  . methylPREDNISolone (SOLU-MEDROL) injection  60 mg Intravenous Q6H  . multivitamin with minerals  1 tablet Oral Daily  . pantoprazole  40 mg Oral Daily  . theophylline  300 mg Oral Q12H  . thiamine  100 mg Oral Daily  . venlafaxine XR  150 mg Oral Q breakfast   Continuous Infusions:    Active Problems:   COPD exacerbation ([place])   Acute on chronic respiratory failure with hypoxia (HCC)   Somnolence   Acute delirium   Diabetes mellitus type 2 in nonobese [facility])   Depression    Time spent: 30 min    Salvatore Poe, [place] [facility] Pager [phone]. If 7PM-7AM, please contact night-coverage at [email or web address], password [email or web address] 03/27/2015, 8:05 AM  LOS: 2 days

## 2015-03-28 LAB — GLUCOSE, CAPILLARY
GLUCOSE-CAPILLARY: 138 mg/dL — AB (ref 65–99)
GLUCOSE-CAPILLARY: 288 mg/dL — AB (ref 65–99)
GLUCOSE-CAPILLARY: 130 mg/dL — AB (ref 65–99)
Glucose-Capillary: 196 mg/dL — ABNORMAL HIGH (ref 65–99)

## 2015-03-28 LAB — BASIC METABOLIC PANEL
Anion gap: 8 (ref 5–15)
BUN: 26 mg/dL — AB (ref 6–20)
CALCIUM: 9.3 mg/dL (ref 8.9–10.3)
CO2: 35 mmol/L — AB (ref 22–32)
CREATININE: 0.67 mg/dL (ref 0.44–1.00)
Chloride: 99 mmol/L — ABNORMAL LOW (ref 101–111)
GFR calc Af Amer: >60 mL/min (ref >60)
GLUCOSE: 152 mg/dL — AB (ref 65–99)
Potassium: 4.7 mmol/L (ref 3.5–5.1)
Sodium: 142 mmol/L (ref 135–145)

## 2015-03-28 LAB — CBC
HCT: 35.9 % — ABNORMAL LOW (ref 36.0–46.0)
HEMOGLOBIN: 11.3 g/dL — AB (ref 12.0–15.0)
MCH: 29.9 pg (ref 26.0–34.0)
MCHC: 31.5 g/dL (ref 30.0–36.0)
MCV: 95 fL (ref 78.0–100.0)
PLATELETS: 216 10*3/uL (ref 150–400)
RBC: 3.78 MIL/uL — ABNORMAL LOW (ref 3.87–5.11)
RDW: 15.4 % (ref 11.5–15.5)
WBC: 8.1 10*3/uL (ref 4.0–10.5)

## 2015-03-28 MED ORDER — INSULIN ASPART 100 UNIT/ML ~~LOC~~ SOLN
5.0000 [IU] | Freq: Three times a day (TID) | SUBCUTANEOUS | Status: DC
  Administered 2015-03-29 (×3): 5 [IU] via SUBCUTANEOUS

## 2015-03-28 NOTE — Progress Notes (Signed)
[facility] PROGRESS NOTE  Traci Thomas ZOX:[id number] DOB: 11-21-1950 DOA: 03/25/2015 PCP: [name], NP  Brief Summary  The patient is a 64 y.o. year-old female with history of COPD on 3L [place] with ongoing smoking, benzodiazepine dependence, chronic pain on chronic narcotics, depression, diabetes mellitus type 2, bladder spasms who presented with mechanical fall and increased SOB.She tripped over a portable toilet and hit her head, her left arm, and landed on her hip. She came to the ER because of her fall. Incidentally, she reported increased SOB with productive cough. She denied fevers. She wears 3L home oxygen which she removes intermittently to smoke.  Her O2 sat was 78% on her home 3L.  Bibasilar pleural thickening on CXR. XR of the left formearm and pelvis were wnl. CT head and cervical spine demionstrated no acute injury but moderate diffuse cortical atrophy and moderate chronic ischemic white matter disease. She was given Duoneb, Magnesium, solumedrol, ativan, zofran, and ceftriaxone. She had significant tremors in ER which she attributed to not taking her xanax today. She does not take her xanax as prescribed. She reported taking 2 tablets with breakfast and then taking additional doses throughout the day as needed, up to four times per day. She was being admitted to stepdown due to current somnolence and confusion in the setting of possible benzodiazepine withdrawal and COPD exacerbation.   Assessment/Plan  Somnolence and delirium, possibly due to concussion, overmedication with multiple sedating medications, acute hypoxia due to COPD exacerbation. I also suspect that she has some sundowning. -  Continue hydrocodone 5-325mg  BID - continue benzodiazepines per CIWA protocol  -  Continue scheduled xanax at lower than home dose - Continue to Hold oxybutynin - Ethanol level neg and UDS c/w with home medications - Ammonia level wnl -  Seroquel  Acute on chronic  hypoxic respiratory failure secondary to acute COPD exacerbation.   - Continue solumedrol 60mg  IV q12h - Continue Duonebs q4h with albuterol q2h prn - Continue budesonide and brovana - Continue theophylline.  - Continue ceftriaxone  Myoclonus/tremor present again this morning - Theophylline level low  - CIWA protocol  Mechanical fall  - No evidence of fracture or head/neck trauma  Diabetes mellitus type 2, with hyperglycemia - A1c 6.5 - Hold metformin - Continue low dose SSI - Increase aspirin to 5 units daily with meals  Depression, stable, continue pristiq  Prolonged QTc. Avoid QTc prolonging medications.   Underweight -  Supplements -  Nutrition assistance appreciated  Diet: diabetic diet Access: PIV IVF: off Proph: lovenox  Code Status: full Family Communication: patient alone Disposition Plan:   Still on 5-6L [place] and trying to taper down.  She will not be safe to go home after this hospitalization.  She lives alone and needs 24 hour supervision.  Advised her to start thinking about where she would like to live or with whom she would like to live in anticipation of discharge in the next few days.  PT agreeing with 24h supervision, HH therapies.  Palliative care consult.    Consultants:  None  Procedures:  CXR  Antibiotics:  Ceftriaxone 10/5 >    HPI/Subjective:  Has ongoing chest tightness and SOB even at rest.  States she is eating okay and would like to eventually go home.    Objective: Filed Vitals:   03/28/15 0813 03/28/15 1019 03/28/15 1259 03/28/15 1414  BP:  142/82  134/73  Pulse:  107  121  Temp:  98.1 F (36.7 C)  99.1 F (  37.3 C)  TempSrc:  Oral  Oral  Resp:  18  20  Height:      Weight:      SpO2: 94% 96% 90% 95%    Intake/Output Summary (Last 24 hours) at 03/28/15 1754 Last data filed at 03/28/15 1718  Gross per 24 hour  Intake    700 ml  Output      0 ml  Net    700 ml   Filed Weights   03/25/15 1720   Weight: 40.1 kg (88 lb 6.5 oz)   Body mass index is 17.56 kg/(m^2).  Exam:   General:  Cachectic female, No acute distress  HEENT:  NCAT, MMM  Cardiovascular:  RRR, nl S1, S2 no mrg, 2+ pulses, warm extremities  Respiratory:  Very diminished bilateral BS, full high pitched expiratory wheeze, no rales or rhonchi, no increased WOB  Abdomen:   NABS, soft, NT/ND  MSK:   Normal tone and bulk, no LEE  Neuro:  Grossly intact  Data Reviewed: Basic Metabolic Panel:  Recent Labs Lab 03/25/15 1318 03/26/15 0800 03/27/15 0335 03/28/15 0555  NA 139 139 142 142  K 3.9 3.6 4.1 4.7  CL 97* 99* 105 99*  CO2 30 32 31 35*  GLUCOSE 167* 118* 151* 152*  BUN 11 15 21* 26*  CREATININE 0.62 0.70 0.68 0.67  CALCIUM 9.6 9.3 9.3 9.3   Liver Function Tests:  Recent Labs Lab 03/25/15 1318  AST 14*  ALT 9*  ALKPHOS 103  BILITOT 0.3  PROT 7.5  ALBUMIN 4.7    Recent Labs Lab 03/25/15 1318  LIPASE 22    Recent Labs Lab 03/25/15 1835  AMMONIA 15   CBC:  Recent Labs Lab 03/25/15 1318 03/26/15 0800 03/27/15 0335 03/28/15 0555  WBC 7.0 9.1 9.1 8.1  NEUTROABS 5.9  --   --   --   HGB 14.4 11.8* 10.6* 11.3*  HCT 44.8 37.2 34.1* 35.9*  MCV 92.9 93.9 93.9 95.0  PLT 235 226 200 216    Recent Results (from the past 240 hour(s))  Blood culture (routine x 2)     Status: None (Preliminary result)   Collection Time: 03/25/15  3:23 PM  Result Value Ref Range Status   Specimen Description BLOOD LEFT HAND  Final   Special Requests BOTTLES DRAWN AEROBIC AND ANAEROBIC 5CC  Final   Culture   Final    NO GROWTH 3 DAYS Performed at [facility]    Report Status PENDING  Incomplete  Blood culture (routine x 2)     Status: None (Preliminary result)   Collection Time: 03/25/15  6:35 PM  Result Value Ref Range Status   Specimen Description BLOOD LEFT ANTECUBITAL  Final   Special Requests BOTTLES DRAWN AEROBIC ONLY 4CC  Final   Culture   Final    NO GROWTH 3  DAYS Performed at [facility]    Report Status PENDING  Incomplete  MRSA PCR Screening     Status: None   Collection Time: 03/25/15 10:30 PM  Result Value Ref Range Status   MRSA by PCR NEGATIVE NEGATIVE Final    Comment:        The GeneXpert MRSA Assay (FDA approved for NASAL specimens only), is one component of a comprehensive MRSA colonization surveillance program. It is not intended to diagnose MRSA infection nor to guide or monitor treatment for MRSA infections.      Studies: No results found.  Scheduled Meds: . ALPRAZolam  0.5 mg Oral TID  . arformoterol  15 mcg Nebulization BID  . azelastine  1 spray Each Nare BID   And  . fluticasone  1 spray Each Nare BID  . budesonide (PULMICORT) nebulizer solution  0.25 mg Nebulization BID  . docusate sodium  100 mg Oral BID  . enoxaparin (LOVENOX) injection  20 mg Subcutaneous Q24H  . feeding supplement (ENSURE ENLIVE)  237 mL Oral BID BM  . HYDROcodone-acetaminophen  1 tablet Oral BID  . insulin aspart  0-5 Units Subcutaneous QHS  . insulin aspart  0-9 Units Subcutaneous TID WC  . insulin aspart  3 Units Subcutaneous TID WC  . ipratropium-albuterol  3 mL Nebulization QID  . LORazepam  0-4 mg Oral Q6H   Followed by  . [START ON 03/29/2015] LORazepam  0-4 mg Oral Q12H  . methylPREDNISolone (SOLU-MEDROL) injection  60 mg Intravenous BID  . multivitamin with minerals  1 tablet Oral Daily  . nicotine  21 mg Transdermal Daily  . pantoprazole  40 mg Oral Daily  . polyethylene glycol  17 g Oral Daily  . QUEtiapine  25 mg Oral QHS  . senna  2 tablet Oral QHS  . theophylline  300 mg Oral Q12H  . venlafaxine XR  150 mg Oral Q breakfast   Continuous Infusions:    Active Problems:   COPD exacerbation ([place])   Acute on chronic respiratory failure with hypoxia (HCC)   Somnolence   Acute delirium   Diabetes mellitus type 2 in nonobese [facility])   Depression    Time spent: 30 min    Traci Thomas, [place]  [facility] Pager [phone]. If 7PM-7AM, please contact night-coverage at [email or web address], password [facility] 03/28/2015, 5:54 PM  LOS: 3 days

## 2015-03-28 NOTE — Evaluation (Signed)
Physical Therapy Evaluation Patient Details Name: Traci Thomas MRN: [id number] DOB: 05-01-1951 Today's Date: 03/28/2015   History of Present Illness  The patient is a 64 y.o. year-old female with history of COPD on 3L [place] with ongoing smoking, benzodiazepine dependence, chronic pain on chronic narcotics, depression, diabetes mellitus type 2, bladder spasms who presents with mechanical fall and increased SOB. negative for fractures. She came to the ER 03/25/15 because of her fall and ongoing headache and neck pain. Incidentally, she reported that she has had some increased SOB over the last few days with increased cough. She was unable to tell me if she had increased sputum or changes to sputum color. She denied fevers. She still smokes about a pack per day and takes her oxygen off when she does  Clinical Impression  Patient is very tremulous, On 5 liters [place]. Sats dropped  To 84 % from 94, HR 118 then jumped to 127 after transferred to recliner. RN aware. Patient reports that she lives alone, does not want to go to rehab and does not want to stay with son. Patient will benefit from acute PT to address problems listed under Problem List below to safely return home at modified independent level. Will need to ambulate more and monitor sats and HR on oxygen. Patient currently requires higher concentration than home oxygen of 3 liters per patient.     Follow Up Recommendations Home health PT;Supervision/Assistance - 24 hour    Equipment Recommendations  Rolling walker with 5" wheels    Recommendations for Other Services       Precautions / Restrictions Precautions Precautions: Fall Precaution Comments: on O2, monitor sats/HR      Mobility  Bed Mobility Overal bed mobility: Needs Assistance Bed Mobility: Supine to Sit     Supine to sit: Min guard     General bed mobility comments: extra time to sit upright.  Transfers Overall transfer level: Needs assistance Equipment used: 1  person hand held assist Transfers: Sit to/from [facility] Sit to Stand: Min assist Stand pivot transfers: Min assist       General transfer comment: assist to steady upon standing up.  Ambulation/Gait                Stairs            Wheelchair Mobility    Modified Rankin (Stroke Patients Only)       Balance Overall balance assessment: Needs assistance;History of Falls Sitting-balance support: Feet supported;No upper extremity supported Sitting balance-Leahy Scale: Good     Standing balance support: During functional activity;No upper extremity supported Standing balance-Leahy Scale: Fair                               Pertinent Vitals/Pain Pain Assessment: 0-10 Pain Score: 7  Pain Location: neck, head ache Pain Descriptors / Indicators: Aching Pain Intervention(s): Repositioned;Patient requesting pain meds-RN notified    Home Living Family/patient expects to be discharged to:: Private residence Living Arrangements: Alone Available Help at Discharge: Family;Available PRN/intermittently Type of Home: Mobile home Home Access: Stairs to enter Entrance Stairs-Rails: Right;Left Entrance Stairs-Number of Steps: 4 Home Layout: One level Home Equipment: None      Prior Function Level of Independence: Independent               Hand Dominance        Extremity/Trunk Assessment   Upper Extremity Assessment: Generalized weakness;RUE deficits/detail;LUE deficits/detail  RUE Deficits / Details: tremors of both hands, just g=had breathing tx.         Lower Extremity Assessment: Generalized weakness         Communication   Communication: No difficulties  Cognition Arousal/Alertness: Awake/alert Behavior During Therapy: WFL for tasks assessed/performed Overall Cognitive Status: Impaired/Different from baseline Area of Impairment: Orientation Orientation Level: Time                  General Comments       Exercises        Assessment/Plan    PT Assessment Patient needs continued PT services  PT Diagnosis Difficulty walking;Generalized weakness;Acute pain   PT Problem List Decreased strength;Decreased activity tolerance;Pain;Decreased safety awareness;Decreased knowledge of use of DME;Cardiopulmonary status limiting activity;Decreased mobility  PT Treatment Interventions DME instruction;Gait training;Stair training;Functional mobility training;Therapeutic activities;Therapeutic exercise;Patient/family education   PT Goals (Current goals can be found in the Care Plan section) Acute Rehab PT Goals Patient Stated Goal: to go home PT Goal Formulation: With patient Time For Goal Achievement: 04/11/15 Potential to Achieve Goals: Good    Frequency Min 3X/week   Barriers to discharge Decreased caregiver support      Co-evaluation               End of Session Equipment Utilized During Treatment: Oxygen Activity Tolerance: Patient limited by pain;Patient limited by fatigue;Treatment limited secondary to medical complications (Comment) Patient left: in chair;with call bell/phone within reach Nurse Communication: Mobility status         Time: 0825-0849 PT Time Calculation (min) (ACUTE ONLY): 24 min   Charges:   PT Evaluation $Initial PT Evaluation Tier I: 1 Procedure PT Treatments $Therapeutic Activity: 8-22 mins   PT G Codes:        Claretha Cooper 03/28/2015, 9:25 AM

## 2015-03-29 DIAGNOSIS — R400 Somnolence: Secondary | ICD-10-CM

## 2015-03-29 DIAGNOSIS — Z515 Encounter for palliative care: Secondary | ICD-10-CM

## 2015-03-29 LAB — CBC
HEMATOCRIT: 36.7 % (ref 36.0–46.0)
Hemoglobin: 11.8 g/dL — ABNORMAL LOW (ref 12.0–15.0)
MCH: 30.6 pg (ref 26.0–34.0)
MCHC: 32.2 g/dL (ref 30.0–36.0)
MCV: 95.3 fL (ref 78.0–100.0)
PLATELETS: 215 10*3/uL (ref 150–400)
RBC: 3.85 MIL/uL — AB (ref 3.87–5.11)
RDW: 15.5 % (ref 11.5–15.5)
WBC: 7.8 10*3/uL (ref 4.0–10.5)

## 2015-03-29 LAB — BASIC METABOLIC PANEL
Anion gap: 6 (ref 5–15)
BUN: 28 mg/dL — AB (ref 6–20)
CHLORIDE: 98 mmol/L — AB (ref 101–111)
CO2: 38 mmol/L — AB (ref 22–32)
CREATININE: 0.78 mg/dL (ref 0.44–1.00)
Calcium: 9.2 mg/dL (ref 8.9–10.3)
GFR calc Af Amer: >60 mL/min (ref >60)
GFR calc non Af Amer: >60 mL/min (ref >60)
Glucose, Bld: 139 mg/dL — ABNORMAL HIGH (ref 65–99)
POTASSIUM: 4.2 mmol/L (ref 3.5–5.1)
Sodium: 142 mmol/L (ref 135–145)

## 2015-03-29 LAB — GLUCOSE, CAPILLARY
Glucose-Capillary: 125 mg/dL — ABNORMAL HIGH (ref 65–99)
Glucose-Capillary: 242 mg/dL — ABNORMAL HIGH (ref 65–99)
Glucose-Capillary: 323 mg/dL — ABNORMAL HIGH (ref 65–99)
Glucose-Capillary: 74 mg/dL (ref 65–99)

## 2015-03-29 MED ORDER — LORAZEPAM 1 MG PO TABS
1.0000 mg | ORAL_TABLET | ORAL | Status: DC | PRN
  Administered 2015-03-29: 1 mg via ORAL
  Filled 2015-03-29: qty 1

## 2015-03-29 NOTE — Progress Notes (Signed)
[facility] PROGRESS NOTE  Traci Thomas MGQ:[id number] DOB: 1950-09-29 DOA: 03/25/2015 PCP: Traci Riches, NP  Brief Summary  The patient is a 64 y.o. year-old female with history of COPD on 3L [place] with ongoing smoking, benzodiazepine dependence, chronic pain on chronic narcotics, depression, diabetes mellitus type 2, bladder spasms who presented with mechanical fall and increased SOB.She tripped over a portable toilet and hit her head, her left arm, and landed on her hip. She came to the ER because of her fall. Incidentally, she reported increased SOB with productive cough. She denied fevers. She wears 3L home oxygen which she removes intermittently to smoke.  Her O2 sat was 78% on her home 3L.  Bibasilar pleural thickening on CXR. XR of the left formearm and pelvis were wnl. CT head and cervical spine demionstrated no acute injury but moderate diffuse cortical atrophy and moderate chronic ischemic white matter disease. She was given Duoneb, Magnesium, solumedrol, ativan, zofran, and ceftriaxone. She had significant tremors in ER which she attributed to not taking her xanax today. She does not take her xanax as prescribed. She reported taking 2 tablets with breakfast and then taking additional doses throughout the day as needed, up to four times per day. She was being admitted to stepdown due to current somnolence and confusion in the setting of possible benzodiazepine withdrawal and COPD exacerbation.   Assessment/Plan  Somnolence and delirium, possibly due to concussion, overmedication with multiple sedating medications, acute hypoxia due to COPD exacerbation. I also suspect that she has some sundowning and has some underlying dementia.   -  Continue hydrocodone 5-325mg  BID - continue ativan prn -  Continue scheduled xanax at lower than home dose - Continue to hold oxybutynin - Ethanol level neg and UDS c/w with home medications - Ammonia level wnl -  Seroquel  qhs  Acute on chronic hypoxic respiratory failure secondary to acute COPD exacerbation.   - Continue solumedrol 60mg  IV q12h - Continue Duonebs q4h with albuterol q2h prn - Continue budesonide and brovana - Continue theophylline.  - d/c ceftriaxone  Myoclonus/tremor - Theophylline level low  Mechanical fall  - No evidence of fracture or head/neck trauma  Diabetes mellitus type 2, with well controlled CBG - A1c 6.5 - Hold metformin - Continue low dose SSI - continue aspart to 5 units daily with meals  Depression, stable, continue pristiq  Prolonged QTc. Avoid QTc prolonging medications.   Underweight -  Supplements -  Nutrition assistance appreciated  Diet: diabetic diet Access: PIV IVF: off Proph: lovenox  Code Status: full Family Communication: patient and husband Disposition Plan:   Still on 5-6L [place] and trying to taper down.  She will not be safe to go home after this hospitalization.  She lives alone and needs 24 hour supervision.  Advised her to start thinking about where she would like to live or with whom she would like to live in anticipation of discharge in the next few days.  PT agreeing with 24h supervision, HH therapies.  Palliative care consult.  Anticipate that she will go home with her son.    Consultants:  None  Procedures:  CXR  Antibiotics:  Ceftriaxone 10/5 > 10/9  HPI/Subjective:  Required sitter and frequently tries to get out of bed without assistance.  High falls risk.  Does not remember conversations that we have had the last several days so we discussed her medications and the plan of care again.  Still coughing and wheezy/[name] of breath.    Objective:  Filed Vitals:   03/29/15 0954 03/29/15 1003 03/29/15 1331 03/29/15 1338  BP:    144/80  Pulse:    105  Temp:    98 F (36.7 C)  TempSrc:    Axillary  Resp:      Height:      Weight:      SpO2: 94% 97% 90% 98%    Intake/Output Summary (Last 24 hours) at  03/29/15 1527 Last data filed at 03/29/15 1300  Gross per 24 hour  Intake   1080 ml  Output      0 ml  Net   1080 ml   Filed Weights   03/25/15 1720  Weight: 40.1 kg (88 lb 6.5 oz)   Body mass index is 17.56 kg/(m^2).  Exam:   General:  Cachectic female, No acute distress  HEENT:  NCAT, MMM  Cardiovascular:  RRR, nl S1, S2 no mrg, 2+ pulses, warm extremities  Respiratory:  Very diminished bilateral BS, full high pitched expiratory wheeze, no rales.  rhonchorous cough today. no increased WOB  Abdomen:   NABS, soft, NT/ND  MSK:   Normal tone and bulk, no LEE  Neuro:  Grossly intact  Data Reviewed: Basic Metabolic Panel:  Recent Labs Lab 03/25/15 1318 03/26/15 0800 03/27/15 0335 03/28/15 0555 03/29/15 0552  NA 139 139 142 142 142  K 3.9 3.6 4.1 4.7 4.2  CL 97* 99* 105 99* 98*  CO2 30 32 31 35* 38*  GLUCOSE 167* 118* 151* 152* 139*  BUN 11 15 21* 26* 28*  CREATININE 0.62 0.70 0.68 0.67 0.78  CALCIUM 9.6 9.3 9.3 9.3 9.2   Liver Function Tests:  Recent Labs Lab 03/25/15 1318  AST 14*  ALT 9*  ALKPHOS 103  BILITOT 0.3  PROT 7.5  ALBUMIN 4.7    Recent Labs Lab 03/25/15 1318  LIPASE 22    Recent Labs Lab 03/25/15 1835  AMMONIA 15   CBC:  Recent Labs Lab 03/25/15 1318 03/26/15 0800 03/27/15 0335 03/28/15 0555 03/29/15 0552  WBC 7.0 9.1 9.1 8.1 7.8  NEUTROABS 5.9  --   --   --   --   HGB 14.4 11.8* 10.6* 11.3* 11.8*  HCT 44.8 37.2 34.1* 35.9* 36.7  MCV 92.9 93.9 93.9 95.0 95.3  PLT 235 226 200 216 215    Recent Results (from the past 240 hour(s))  Blood culture (routine x 2)     Status: None (Preliminary result)   Collection Time: 03/25/15  3:23 PM  Result Value Ref Range Status   Specimen Description BLOOD LEFT HAND  Final   Special Requests BOTTLES DRAWN AEROBIC AND ANAEROBIC 5CC  Final   Culture   Final    NO GROWTH 4 DAYS Performed at [facility]    Report Status PENDING  Incomplete  Blood culture (routine x 2)      Status: None (Preliminary result)   Collection Time: 03/25/15  6:35 PM  Result Value Ref Range Status   Specimen Description BLOOD LEFT ANTECUBITAL  Final   Special Requests BOTTLES DRAWN AEROBIC ONLY 4CC  Final   Culture   Final    NO GROWTH 4 DAYS Performed at [facility]    Report Status PENDING  Incomplete  MRSA PCR Screening     Status: None   Collection Time: 03/25/15 10:30 PM  Result Value Ref Range Status   MRSA by PCR NEGATIVE NEGATIVE Final    Comment:        The  GeneXpert MRSA Assay (FDA approved for NASAL specimens only), is one component of a comprehensive MRSA colonization surveillance program. It is not intended to diagnose MRSA infection nor to guide or monitor treatment for MRSA infections.      Studies: No results found.  Scheduled Meds: . ALPRAZolam  0.5 mg Oral TID  . arformoterol  15 mcg Nebulization BID  . azelastine  1 spray Each Nare BID   And  . fluticasone  1 spray Each Nare BID  . budesonide (PULMICORT) nebulizer solution  0.25 mg Nebulization BID  . docusate sodium  100 mg Oral BID  . enoxaparin (LOVENOX) injection  20 mg Subcutaneous Q24H  . feeding supplement (ENSURE ENLIVE)  237 mL Oral BID BM  . HYDROcodone-acetaminophen  1 tablet Oral BID  . insulin aspart  0-5 Units Subcutaneous QHS  . insulin aspart  0-9 Units Subcutaneous TID WC  . insulin aspart  5 Units Subcutaneous TID WC  . ipratropium-albuterol  3 mL Nebulization QID  . methylPREDNISolone (SOLU-MEDROL) injection  60 mg Intravenous BID  . multivitamin with minerals  1 tablet Oral Daily  . nicotine  21 mg Transdermal Daily  . pantoprazole  40 mg Oral Daily  . polyethylene glycol  17 g Oral Daily  . QUEtiapine  25 mg Oral QHS  . senna  2 tablet Oral QHS  . theophylline  300 mg Oral Q12H  . venlafaxine XR  150 mg Oral Q breakfast   Continuous Infusions:    Active Problems:   COPD exacerbation ([place])   Acute on chronic respiratory failure with hypoxia ([facility])    Somnolence   Acute delirium   Diabetes mellitus type 2 in nonobese [facility])   Depression    Time spent: 30 min    Tesslyn Baumert, [facility] Pager [phone]. If 7PM-7AM, please contact night-coverage at [email or web address], password [facility] 03/29/2015, 3:27 PM  LOS: 4 days

## 2015-03-29 NOTE — Care Management Note (Signed)
Case Management Note  Patient Details  Name: TRUC WINFREE MRN: [id number] Date of Birth: 03-20-1951  Subjective/Objective:                  Fall and increased SOB  Action/Plan: Discharge planning  Expected Discharge Date:   (unknown)               Expected Discharge Plan:  [place]  In-House Referral:     Discharge planning Services  CM Consult  Post Acute Care Choice:  Home Health Choice offered to:  Patient  DME Arranged:    DME Agency:     HH Arranged:    [place] Agency:     Status of Service:  In process, will continue to follow  Medicare Important Message Given:    Date Medicare IM Given:    Medicare IM give by:    Date Additional Medicare IM Given:    Additional Medicare Important Message give by:     If discussed at [place] of Stay Meetings, dates discussed:    Additional Comments: CM spoke with patient at the bedside. Patient states she does not know whether she will return to her home in [facility] or go to stay with her son. She needs to speak with her son later today about her plans for discharge. CM provided her with a list of home health agencies in [place]. She knows who she will select for home care if she returns to her home in [place]. She does not want to order a RW at this time. States she has 2 walkers at home she does not use, they are not rolling walkers. States she needs to talk to her son before she is able to decide whether or not she wants a RW. Patient provided with weekend CM telephone number and agrees to call CM when she has discussed her plan with her son later today. Will continue to follow for needs.  Apolonio Schneiders, RN 03/29/2015, 10:32 AM

## 2015-03-29 NOTE — Consult Note (Signed)
Consultation Note Date: 03/29/2015   Patient Name: Traci Thomas  DOB: 1951-05-25  MRN: [id number]  Age / Sex: 64 y.o., female   PCP: [name], NP Referring Physician: Janece Canterbury, MD  Reason for Consultation: Establishing goals of care  Palliative Care Assessment and Plan Summary of Established Goals of Care and Medical Treatment Preferences  The patient is a 64 y.o. year-old female with history of COPD on 3L [place] with ongoing smoking, benzodiazepine dependence, chronic pain on chronic narcotics, depression, diabetes mellitus type 2, bladder spasms who presented with mechanical fall and increased SOB.She tripped over a portable toilet and hit her head, her left arm, and landed on her hip. She came to the ER because of her fall. Incidentally, she reported increased SOB with productive cough. She denied fevers. She wears 3L home oxygen which she removes intermittently to smoke. Her O2 sat was 78% on her home 3L. Bibasilar pleural thickening on CXR. XR of the left formearm and pelvis were wnl. CT head and cervical spine demionstrated no acute injury but moderate diffuse cortical atrophy and moderate chronic ischemic white matter disease. She was given Duoneb, Magnesium, solumedrol, ativan, zofran, and ceftriaxone. She had significant tremors in ER which she attributed to not taking her xanax today. She does not take her xanax as prescribed. She reported taking 2 tablets with breakfast and then taking additional doses throughout the day as needed, up to four times per day. She was being admitted to stepdown due to current somnolence and confusion in the setting of possible benzodiazepine withdrawal and COPD exacerbation.   The patient has been admitted to the hospitalist service with COPD exacerbation and acute multifactorial encephalopathy due to overmedication with multiple sedating medications, delirium and acute on chronic hypercarbic resp failure.   The patient is resting in  bed, she is awake alert, there is a [employer] cabin crew present at the bedside. The patient states that she has emphysema for most of her life, she does not like it when it is entirely blamed on her cigarette use. She states that while she does smoke, she has worked in [employer] all her life, and has had bronchitis as a child quite often. She wants to be independent and decisional for as long as possible. She is adamant about not going to a nursing facility. She thinks she might go home with her son towards the end of this hospitalization. She has ocassional cough, she denies any acute dyspnea currently.    Contacts/Participants in Discussion: Primary Decision Maker:     HCPOA: yes   son   Code Status/Advance Care Planning:   DNR  Symptom Management:   D/c schedule hydrocodone, continue PRN hydrocodone, may consider PRN Roxanol or PRN PO Oxycodone IR if the patient is taking too much PRN hydrocodone.   Palliative Prophylaxis: yes  Additional Recommendations (Limitations, Scope, Preferences):  Agree with DNR, home with son. Would recommend hospice Psycho-social/Spiritual:   Support System: son [name] [phone]  Desire for further Chaplaincy support:no  Prognosis: < 6 months  Discharge Planning:  Home with son. Would recommend Hospice involvement after D/C    Values: values her independence, she does not want to go to SNF, she wishes to go home with son.  Life limiting illness: end stage COPD, with ongoing smoking, benzodiazepine dependence, chronic pain on chronic narcotics, depression, diabetes mellitus type 2      Chief Complaint/History of Present Illness: shortness of breath   Primary Diagnoses  Present on  Admission:  . COPD exacerbation ([place])  Palliative Review of Systems:  completed  I have reviewed the medical record, interviewed the patient and family, and examined the patient. The following aspects are pertinent.  Past Medical History  Diagnosis  Date  . Diabetes mellitus without complication ([place])   . Asthma   . Back pain   . COPD (chronic obstructive pulmonary disease) ([facility])     Home O2 3lpm, theophylline   Social History   Social History  . Marital Status: Single    Spouse Name: N/A  . Number of Children: N/A  . Years of Education: N/A   Social History Main Topics  . Smoking status: Current Every Day Smoker -- 1.00 packs/day  . Smokeless tobacco: None  . Alcohol Use: 0.0 oz/week    0 Standard drinks or equivalent per week     Comment: occasional beer  . Drug Use: No  . Sexual Activity: Not Asked   Other Topics Concern  . None   Social History Narrative   Family History  Problem Relation Age of Onset  . COPD Mother   . Diabetes     Scheduled Meds: . ALPRAZolam  0.5 mg Oral TID  . arformoterol  15 mcg Nebulization BID  . azelastine  1 spray Each Nare BID   And  . fluticasone  1 spray Each Nare BID  . budesonide (PULMICORT) nebulizer solution  0.25 mg Nebulization BID  . docusate sodium  100 mg Oral BID  . enoxaparin (LOVENOX) injection  20 mg Subcutaneous Q24H  . feeding supplement (ENSURE ENLIVE)  237 mL Oral BID BM  . insulin aspart  0-5 Units Subcutaneous QHS  . insulin aspart  0-9 Units Subcutaneous TID WC  . insulin aspart  5 Units Subcutaneous TID WC  . ipratropium-albuterol  3 mL Nebulization QID  . methylPREDNISolone (SOLU-MEDROL) injection  60 mg Intravenous BID  . multivitamin with minerals  1 tablet Oral Daily  . nicotine  21 mg Transdermal Daily  . pantoprazole  40 mg Oral Daily  . polyethylene glycol  17 g Oral Daily  . QUEtiapine  25 mg Oral QHS  . senna  2 tablet Oral QHS  . theophylline  300 mg Oral Q12H  . venlafaxine XR  150 mg Oral Q breakfast   Continuous Infusions:  PRN Meds:.albuterol, diclofenac sodium, HYDROcodone-acetaminophen, metoprolol, promethazine, sodium chloride Medications Prior to Admission:  Prior to Admission medications   Medication Sig Start Date End Date  Taking? Authorizing Provider  albuterol (PROVENTIL HFA;VENTOLIN HFA) 108 (90 BASE) MCG/ACT inhaler Inhale 2 puffs into the lungs every 6 (six) hours as needed for wheezing or shortness of breath.   Yes Historical Provider, MD  ALPRAZolam Duanne Moron) 1 MG tablet Take 1 mg by mouth every 6 (six) hours.   Yes Historical Provider, MD  Azelastine-Fluticasone (DYMISTA) 137-50 MCG/ACT SUSP Place 1 spray into the nose 2 (two) times daily.    Yes Historical Provider, MD  cetirizine (ZYRTEC) 5 MG tablet Take 10 mg by mouth daily.   Yes Historical Provider, MD  desvenlafaxine (PRISTIQ) 100 MG 24 hr tablet Take 100 mg by mouth daily.   Yes Historical Provider, MD  diclofenac sodium (VOLTAREN) 1 % GEL Place 1 application onto the skin 4 (four) times daily as needed. Joint pain.   Yes Historical Provider, MD  HYDROcodone-acetaminophen (NORCO) 10-325 MG tablet Take 1 tablet by mouth 3 (three) times daily.   Yes Historical Provider, MD  lansoprazole (PREVACID) 30 MG capsule Take 30  mg by mouth 2 (two) times daily before a meal.   Yes Historical Provider, MD  metFORMIN (GLUCOPHAGE) 500 MG tablet Take 500 mg by mouth daily with breakfast.   Yes Historical Provider, MD  mometasone-formoterol (DULERA) 200-5 MCG/ACT AERO Inhale 2 puffs into the lungs 2 (two) times daily.   Yes Historical Provider, MD  nystatin cream (MYCOSTATIN) Apply 1 application topically 2 (two) times daily.   Yes Historical Provider, MD  oxybutynin (DITROPAN) 5 MG tablet Take 5 mg by mouth at bedtime.   Yes Historical Provider, MD  potassium chloride SA (K-DUR,KLOR-CON) 20 MEQ tablet Take 60 mEq by mouth 3 (three) times daily. Suppose to take for 3 days 03/19/15  Yes Historical Provider, MD  theophylline (THEODUR) 300 MG 12 hr tablet Take 300 mg by mouth 2 (two) times daily.   Yes Historical Provider, MD  triamcinolone cream (KENALOG) 0.1 % Apply 1 application topically 2 (two) times daily.   Yes Historical Provider, MD  Vitamin D, Ergocalciferol,  (DRISDOL) 50000 UNITS CAPS capsule Take 50,000 Units by mouth every Saturday.    Yes Historical Provider, MD   Allergies  Allergen Reactions  . Adhesive [Tape] Other (See Comments)    Skin peels off   . Ciprofloxacin     Unknown reaction   . Prednisone Nausea And Vomiting   CBC:    Component Value Date/Time   WBC 7.8 03/29/2015 0552   HGB 11.8* 03/29/2015 0552   HCT 36.7 03/29/2015 0552   PLT 215 03/29/2015 0552   MCV 95.3 03/29/2015 0552   NEUTROABS 5.9 03/25/2015 1318   LYMPHSABS 0.6* 03/25/2015 1318   MONOABS 0.5 03/25/2015 1318   EOSABS 0.0 03/25/2015 1318   BASOSABS 0.0 03/25/2015 1318   Comprehensive Metabolic Panel:    Component Value Date/Time   NA 142 03/29/2015 0552   K 4.2 03/29/2015 0552   CL 98* 03/29/2015 0552   CO2 38* 03/29/2015 0552   BUN 28* 03/29/2015 0552   CREATININE 0.78 03/29/2015 0552   GLUCOSE 139* 03/29/2015 0552   CALCIUM 9.2 03/29/2015 0552   AST 14* 03/25/2015 1318   ALT 9* 03/25/2015 1318   ALKPHOS 103 03/25/2015 1318   BILITOT 0.3 03/25/2015 1318   PROT 7.5 03/25/2015 1318   ALBUMIN 4.7 03/25/2015 1318    Physical Exam: Vital Signs: BP 144/80 mmHg  Pulse 105  Temp(Src) 98 F (36.7 C) (Axillary)  Resp 18  Ht 4' 11.5" (1.511 m)  Wt 40.1 kg (88 lb 6.5 oz)  BMI 17.56 kg/m2  SpO2 98% SpO2: SpO2: 98 % O2 Device: O2 Device: Nasal Cannula O2 Flow Rate: O2 Flow Rate (L/min): 3 L/min Intake/output summary:  Intake/Output Summary (Last 24 hours) at 03/29/15 1554 Last data filed at 03/29/15 1300  Gross per 24 hour  Intake   1080 ml  Output      0 ml  Net   1080 ml   LBM: Last BM Date: 03/28/15 Baseline Weight: Weight: 40.1 kg (88 lb 6.5 oz) Most recent weight: Weight: 40.1 kg (88 lb 6.5 oz)  Exam Findings:   General: Cachectic female, No acute distress ocassional cough   HEENT: NCAT, MMM  Cardiovascular: RRR   Respiratory:  diminished bilateral BS,    Abdomen: NABS, soft, NT/ND   Neuro: awake alert in no  distress                       Palliative Performance Scale: 40% Additional Data Reviewed: Recent Labs  03/28/15  0555  03/29/15  0552  WBC  8.1  7.8  HGB  11.3*  11.8*  PLT  216  215  NA  142  142  BUN  26*  28*  CREATININE  0.67  0.78     Time In: 1100 Time Out: 1200 Time Total: 60 min  Greater than 50%  of this time was spent counseling and coordinating care related to the above assessment and plan.  Signed by: Loistine Chance, MD [phone]  Loistine Chance, MD  03/29/2015, 3:54 PM  Please contact Palliative Medicine Team phone at [phone] for questions and concerns.

## 2015-03-30 ENCOUNTER — Institutional Professional Consult (permissible substitution): Payer: Medicare Other | Admitting: Pulmonary Disease

## 2015-03-30 LAB — CBC
HEMATOCRIT: 37.2 % (ref 36.0–46.0)
Hemoglobin: 11.5 g/dL — ABNORMAL LOW (ref 12.0–15.0)
MCH: 29.6 pg (ref 26.0–34.0)
MCHC: 30.9 g/dL (ref 30.0–36.0)
MCV: 95.9 fL (ref 78.0–100.0)
PLATELETS: 233 10*3/uL (ref 150–400)
RBC: 3.88 MIL/uL (ref 3.87–5.11)
RDW: 15.2 % (ref 11.5–15.5)
WBC: 7.9 10*3/uL (ref 4.0–10.5)

## 2015-03-30 LAB — BASIC METABOLIC PANEL
Anion gap: 6 (ref 5–15)
BUN: 25 mg/dL — AB (ref 6–20)
CHLORIDE: 98 mmol/L — AB (ref 101–111)
CO2: 37 mmol/L — AB (ref 22–32)
CREATININE: 0.66 mg/dL (ref 0.44–1.00)
Calcium: 9.2 mg/dL (ref 8.9–10.3)
GFR calc Af Amer: >60 mL/min (ref >60)
GFR calc non Af Amer: >60 mL/min (ref >60)
GLUCOSE: 164 mg/dL — AB (ref 65–99)
POTASSIUM: 4.5 mmol/L (ref 3.5–5.1)
SODIUM: 141 mmol/L (ref 135–145)

## 2015-03-30 LAB — GLUCOSE, CAPILLARY
GLUCOSE-CAPILLARY: 143 mg/dL — AB (ref 65–99)
Glucose-Capillary: 105 mg/dL — ABNORMAL HIGH (ref 65–99)
Glucose-Capillary: 214 mg/dL — ABNORMAL HIGH (ref 65–99)
Glucose-Capillary: 160 mg/dL — ABNORMAL HIGH (ref 65–99)

## 2015-03-30 LAB — CULTURE, BLOOD (ROUTINE X 2)
Culture: NO GROWTH
Culture: NO GROWTH

## 2015-03-30 MED ORDER — PREDNISOLONE 5 MG PO TABS
60.0000 mg | ORAL_TABLET | Freq: Every day | ORAL | Status: DC
  Administered 2015-03-31 – 2015-04-01 (×2): 60 mg via ORAL
  Filled 2015-03-30 (×2): qty 12

## 2015-03-30 MED ORDER — INSULIN ASPART 100 UNIT/ML ~~LOC~~ SOLN
0.0000 [IU] | Freq: Every day | SUBCUTANEOUS | Status: DC
  Administered 2015-03-31: 2 [IU] via SUBCUTANEOUS

## 2015-03-30 MED ORDER — INSULIN ASPART 100 UNIT/ML ~~LOC~~ SOLN
0.0000 [IU] | Freq: Three times a day (TID) | SUBCUTANEOUS | Status: DC
  Administered 2015-03-30: 5 [IU] via SUBCUTANEOUS
  Administered 2015-03-30: 2 [IU] via SUBCUTANEOUS
  Administered 2015-03-31: 5 [IU] via SUBCUTANEOUS

## 2015-03-30 NOTE — Progress Notes (Addendum)
[facility] PROGRESS NOTE  Traci Thomas:[id number] DOB: Apr 07, 1951 DOA: 03/25/2015 PCP: [name], NP  Brief Summary  The patient is a 64 y.o. year-old female with history of COPD on 3L [place] with ongoing smoking, benzodiazepine dependence, chronic pain on chronic narcotics, depression, diabetes mellitus type 2, bladder spasms who presented with mechanical fall and increased SOB.She tripped over a portable toilet and hit her head, her left arm, and landed on her hip. She came to the ER because of her fall. Incidentally, she reported increased SOB with productive cough. She denied fevers. She wears 3L home oxygen which she removes intermittently to smoke.  Her O2 sat was 78% on her home 3L.  Bibasilar pleural thickening on CXR. XR of the left formearm and pelvis were wnl. CT head and cervical spine demionstrated no acute injury but moderate diffuse cortical atrophy and moderate chronic ischemic white matter disease. She was given Duoneb, Magnesium, solumedrol, ativan, zofran, and ceftriaxone. She had significant tremors in ER which she attributed to not taking her xanax today. She does not take her xanax as prescribed. She reported taking 2 tablets with breakfast and then taking additional doses throughout the day as needed, up to four times per day. She was being admitted to stepdown due to current somnolence and confusion in the setting of possible benzodiazepine withdrawal and COPD exacerbation.   Assessment/Plan  Somnolence and delirium, possibly due to concussion, overmedication with multiple sedating medications, acute hypoxia due to COPD exacerbation. I also suspect that she has some sundowning and has some underlying dementia.  -  Continue hydrocodone 5-325mg  BID - continue ativan prn - Continue scheduled xanax at lower than home dose - Continue to hold oxybutynin - Ethanol level neg and UDS c/w with home medications - Ammonia level wnl - Seroquel  qhs  Acute on chronic hypoxic respiratory failure secondary to acute COPD exacerbation. She has severe COPD with persistent high oxygen requirements, ongoing dyspnea. This is her fourth hospitalization in the last 6 months for COPD-related issues.  - d/c solumedrol and start prednisolone - Continue Duonebs q4h with albuterol q2h prn - Continue budesonide and brovana - Continue theophylline.   Myoclonus/tremor, likely due to benzodiazepine dependence - Theophylline level okay  Mechanical fall  - No evidence of fracture or head/neck trauma  Diabetes mellitus type 2, with low normal CBG - A1c 6.5 - Hold metformin - Continue low dose SSI - D/c meal time insulin  Depression, stable, continue pristiq  Prolonged QTc. Avoid QTc prolonging medications.   Underweight - Supplements - Nutrition assistance appreciated  Diet: diabetic diet Access: PIV IVF: off Proph: lovenox  Code Status: full Family Communication: patient and son.  Disposition Plan: Drops O2 sat when ambulating with 6L [place] to mid-80s. Recommended hospice care. Plan to d/c to son's home with hospice care.   Consultants:  None  Procedures:  CXR  Antibiotics:  Ceftriaxone 10/5 > 10/9  HPI/Subjective:  Still feels shaky. Does not remember conversations that we have had the last several days so we discussed her medications and the plan of care again. Coughing less but still SOB and unsteady  Objective: Filed Vitals:   03/30/15 0808 03/30/15 1150 03/30/15 1433 03/30/15 1545  BP:   149/77   Pulse:   122   Temp:   98.5 F (36.9 C)   TempSrc:   Oral   Resp:   19   Height:      Weight:      SpO2: 92% 92% 91%  89%    Intake/Output Summary (Last 24 hours) at 03/30/15 1756 Last data filed at 03/30/15 1700  Gross per 24 hour  Intake   1440 ml  Output      0 ml  Net   1440 ml   Filed Weights   03/25/15 1720  Weight: 40.1 kg (88 lb 6.5 oz)   Body mass index is 17.56  kg/(m^2).  Exam:   General: Cachectic female, No acute distress  HEENT: NCAT, MMM  Cardiovascular: RRR, nl S1, S2 no mrg, 2+ pulses, warm extremities  Respiratory: Very diminished bilateral BS, no obvious wheeze, no rales. Less cough. no increased WOB  Abdomen: NABS, soft, NT/ND  MSK: Normal tone and bulk, no LEE  Neuro: Grossly intact  Data Reviewed: Basic Metabolic Panel:  Recent Labs Lab 03/26/15 0800 03/27/15 0335 03/28/15 0555 03/29/15 0552 03/30/15 0513  NA 139 142 142 142 141  K 3.6 4.1 4.7 4.2 4.5  CL 99* 105 99* 98* 98*  CO2 32 31 35* 38* 37*  GLUCOSE 118* 151* 152* 139* 164*  BUN 15 21* 26* 28* 25*  CREATININE 0.70 0.68 0.67 0.78 0.66  CALCIUM 9.3 9.3 9.3 9.2 9.2   Liver Function Tests:  Recent Labs Lab 03/25/15 1318  AST 14*  ALT 9*  ALKPHOS 103  BILITOT 0.3  PROT 7.5  ALBUMIN 4.7    Recent Labs Lab 03/25/15 1318  LIPASE 22    Recent Labs Lab 03/25/15 1835  AMMONIA 15   CBC:  Recent Labs Lab 03/25/15 1318 03/26/15 0800 03/27/15 0335 03/28/15 0555 03/29/15 0552 03/30/15 0513  WBC 7.0 9.1 9.1 8.1 7.8 7.9  NEUTROABS 5.9  --   --   --   --   --   HGB 14.4 11.8* 10.6* 11.3* 11.8* 11.5*  HCT 44.8 37.2 34.1* 35.9* 36.7 37.2  MCV 92.9 93.9 93.9 95.0 95.3 95.9  PLT 235 226 200 216 215 233    Recent Results (from the past 240 hour(s))  Blood culture (routine x 2)     Status: None   Collection Time: 03/25/15  3:23 PM  Result Value Ref Range Status   Specimen Description BLOOD LEFT HAND  Final   Special Requests BOTTLES DRAWN AEROBIC AND ANAEROBIC 5CC  Final   Culture   Final    NO GROWTH 5 DAYS Performed at [facility]    Report Status 03/30/2015 FINAL  Final  Blood culture (routine x 2)     Status: None   Collection Time: 03/25/15  6:35 PM  Result Value Ref Range Status   Specimen Description BLOOD LEFT ANTECUBITAL  Final   Special Requests BOTTLES DRAWN AEROBIC ONLY 4CC  Final   Culture   Final    NO  GROWTH 5 DAYS Performed at [facility]    Report Status 03/30/2015 FINAL  Final  MRSA PCR Screening     Status: None   Collection Time: 03/25/15 10:30 PM  Result Value Ref Range Status   MRSA by PCR NEGATIVE NEGATIVE Final    Comment:        The GeneXpert MRSA Assay (FDA approved for NASAL specimens only), is one component of a comprehensive MRSA colonization surveillance program. It is not intended to diagnose MRSA infection nor to guide or monitor treatment for MRSA infections.      Studies: No results found.  Scheduled Meds: . ALPRAZolam  0.5 mg Oral TID  . arformoterol  15 mcg Nebulization BID  . azelastine  1 spray Each Nare BID   And  . fluticasone  1 spray Each Nare BID  . budesonide (PULMICORT) nebulizer solution  0.25 mg Nebulization BID  . docusate sodium  100 mg Oral BID  . enoxaparin (LOVENOX) injection  20 mg Subcutaneous Q24H  . feeding supplement (ENSURE ENLIVE)  237 mL Oral BID BM  . insulin aspart  0-15 Units Subcutaneous TID WC  . insulin aspart  0-5 Units Subcutaneous QHS  . ipratropium-albuterol  3 mL Nebulization QID  . multivitamin with minerals  1 tablet Oral Daily  . nicotine  21 mg Transdermal Daily  . pantoprazole  40 mg Oral Daily  . polyethylene glycol  17 g Oral Daily  . [START ON 03/31/2015] prednisoLONE  60 mg Oral Daily  . QUEtiapine  25 mg Oral QHS  . senna  2 tablet Oral QHS  . theophylline  300 mg Oral Q12H  . venlafaxine XR  150 mg Oral Q breakfast   Continuous Infusions:    Active Problems:   COPD exacerbation ([place])   Acute on chronic respiratory failure with hypoxia ([facility])   Somnolence   Acute delirium   Diabetes mellitus type 2 in nonobese [facility])   Depression   Encounter for palliative care    Time spent: 30 min    Jenisa Monty, [facility] Pager [phone]. If 7PM-7AM, please contact night-coverage at [email or web address], password [facility] 03/30/2015, 5:56 PM  LOS: 5 days

## 2015-03-30 NOTE — Care Management Important Message (Signed)
Important Message  Patient Details  Name: Traci Thomas MRN: [id number] Date of Birth: 1950-12-25   Medicare Important Message Given:  Yes-second notification given    Camillo Flaming 03/30/2015, 1:55 [place] Message  Patient Details  Name: Traci Thomas MRN: [id number] Date of Birth: 1950-06-28   Medicare Important Message Given:  Yes-second notification given    Camillo Flaming 03/30/2015, 1:55 PM

## 2015-03-30 NOTE — Progress Notes (Signed)
Physical Therapy Treatment Patient Details Name: Traci Thomas MRN: [id number] DOB: Sep 24, 1950 Today's Date: 03/30/2015    History of Present Illness The patient is a 64 y.o. year-old female with history of COPD on 3L [place] with ongoing smoking, benzodiazepine dependence, chronic pain on chronic narcotics, depression, diabetes mellitus type 2, bladder spasms who presents with mechanical fall and increased SOB. To the  ER 03/25/15 because of her fall and ongoing headache and neck pain.  still smokes about a pack per day and takes her oxygen off when she does    PT Comments    Patient's sats dropped to 78% on 4 l. Ambulated on 6 with sats below 85%. Patient is very shakey and balance is not good. Patient lives alone.  Follow Up Recommendations  Home health PT;Supervision/Assistance - 24 hour     Equipment Recommendations  Rolling walker with 5" wheels    Recommendations for Other Services       Precautions / Restrictions Precautions Precaution Comments: on O2, monitor sats/HR    Mobility  Bed Mobility Overal bed mobility: Needs Assistance Bed Mobility: Sit to Supine     Supine to sit: Min guard Sit to supine: Supervision   General bed mobility comments: extra time to sit upright.  Transfers Overall transfer level: Needs assistance Equipment used: 1 person hand held assist Transfers: Sit to/from Stand Sit to Stand: Min assist         General transfer comment: assist to steady upon standing up.noted to be very shakey  Ambulation/Gait Ambulation/Gait assistance: Min assist;+2 safety/equipment Ambulation Distance (Feet): 150 Feet Assistive device: 1 person hand held assist Gait Pattern/deviations: Step-through pattern;Staggering left;Staggering right     General Gait Details: slow gait, sats  dropped to 78 % on 4 l, placed on 6 liters for ambulating. sats  still at 83%, HR 129. stopped for stand and rest break and get sats back up, that was when O2  was increased to  6.   Stairs            Wheelchair Mobility    Modified Rankin (Stroke Patients Only)       Balance           Standing balance support: During functional activity;No upper extremity supported Standing balance-Leahy Scale: Poor                      Cognition Arousal/Alertness: Awake/alert                          Exercises      General Comments        Pertinent Vitals/Pain Pain Score: 6  Pain Location: neck and back and abdomen Pain Descriptors / Indicators: Aching;Constant Pain Intervention(s): Monitored during session;Premedicated before session    Home Living                      Prior Function            PT Goals (current goals can now be found in the care plan section) Progress towards PT goals: Progressing toward goals    Frequency  Min 3X/week    PT Plan Current plan remains appropriate    Co-evaluation             End of Session Equipment Utilized During Treatment: Oxygen;Gait belt Activity Tolerance: Patient limited by pain;Patient limited by fatigue;Treatment limited secondary to medical complications (Comment) Patient left: in bed;with call  bell/phone within reach     Time: 0842-0857 PT Time Calculation (min) (ACUTE ONLY): 15 min  Charges:  $Gait Training: 8-22 mins                    G Codes:      Claretha Cooper 03/30/2015, 1:10 PM

## 2015-03-31 LAB — GLUCOSE, CAPILLARY
GLUCOSE-CAPILLARY: 117 mg/dL — AB (ref 65–99)
GLUCOSE-CAPILLARY: 236 mg/dL — AB (ref 65–99)
Glucose-Capillary: 102 mg/dL — ABNORMAL HIGH (ref 65–99)
Glucose-Capillary: 212 mg/dL — ABNORMAL HIGH (ref 65–99)

## 2015-03-31 LAB — BASIC METABOLIC PANEL
Anion gap: 6 (ref 5–15)
BUN: 30 mg/dL — ABNORMAL HIGH (ref 6–20)
CO2: 40 mmol/L — ABNORMAL HIGH (ref 22–32)
CREATININE: 0.76 mg/dL (ref 0.44–1.00)
Calcium: 9.4 mg/dL (ref 8.9–10.3)
Chloride: 97 mmol/L — ABNORMAL LOW (ref 101–111)
GFR calc Af Amer: >60 mL/min (ref >60)
Glucose, Bld: 106 mg/dL — ABNORMAL HIGH (ref 65–99)
Potassium: 4.5 mmol/L (ref 3.5–5.1)
SODIUM: 143 mmol/L (ref 135–145)

## 2015-03-31 LAB — CBC
HCT: 38.3 % (ref 36.0–46.0)
Hemoglobin: 11.8 g/dL — ABNORMAL LOW (ref 12.0–15.0)
MCH: 29.2 pg (ref 26.0–34.0)
MCHC: 30.8 g/dL (ref 30.0–36.0)
MCV: 94.8 fL (ref 78.0–100.0)
PLATELETS: 222 10*3/uL (ref 150–400)
RBC: 4.04 MIL/uL (ref 3.87–5.11)
RDW: 15.3 % (ref 11.5–15.5)
WBC: 9.1 10*3/uL (ref 4.0–10.5)

## 2015-03-31 MED ORDER — ALBUTEROL SULFATE (2.5 MG/3ML) 0.083% IN NEBU: 2.5000 mg | INHALATION_SOLUTION | Freq: Four times a day (QID) | RESPIRATORY_TRACT | Status: DC | PRN

## 2015-03-31 MED ORDER — TIOTROPIUM BROMIDE MONOHYDRATE 18 MCG IN CAPS: 18.0000 ug | ORAL_CAPSULE | Freq: Every day | RESPIRATORY_TRACT | Status: AC

## 2015-03-31 MED ORDER — MORPHINE SULFATE (CONCENTRATE) 20 MG/ML PO SOLN: 10.0000 mg | ORAL | Status: DC | PRN

## 2015-03-31 MED ORDER — OXYBUTYNIN CHLORIDE 5 MG PO TABS
5.0000 mg | ORAL_TABLET | Freq: Every day | ORAL | Status: DC
  Administered 2015-03-31: 5 mg via ORAL
  Filled 2015-03-31 (×3): qty 1

## 2015-03-31 MED ORDER — QUETIAPINE FUMARATE 25 MG PO TABS: 25.0000 mg | ORAL_TABLET | Freq: Every day | ORAL | Status: DC

## 2015-03-31 MED ORDER — PREDNISOLONE 15 MG/5ML PO SOLN: ORAL | Status: DC

## 2015-03-31 MED ORDER — GLUCERNA SHAKE PO LIQD
237.0000 mL | Freq: Two times a day (BID) | ORAL | Status: DC
  Administered 2015-04-01: 237 mL via ORAL
  Filled 2015-03-31 (×3): qty 237

## 2015-03-31 MED ORDER — ALPRAZOLAM 0.5 MG PO TABS: 0.5000 mg | ORAL_TABLET | Freq: Three times a day (TID) | ORAL | Status: DC

## 2015-03-31 MED ORDER — GLUCERNA SHAKE PO LIQD: 237.0000 mL | Freq: Two times a day (BID) | ORAL | Status: DC

## 2015-03-31 NOTE — Progress Notes (Signed)
Physical Therapy Treatment Patient Details Name: Traci Thomas MRN: [id number] DOB: 01-16-1951 Today's Date: 03/31/2015    History of Present Illness The patient is a 64 y.o. year-old female with history of COPD on 3L [place] with ongoing smoking, benzodiazepine dependence, chronic pain on chronic narcotics, depression, diabetes mellitus type 2, bladder spasms who presents with mechanical fall and increased SOB. To the  ER 03/25/15 because of her fall and ongoing headache and neck pain.  still smokes about a pack per day and takes her oxygen off when she does    PT Comments    Assisted with amb a limited distance on oxygen.  Pt slightly tearful  After MD's visit.  Appears at/close to  prior functional level.    Follow Up Recommendations  Home health PT;Supervision/Assistance - 24 hour     Equipment Recommendations  Rolling walker with 5" wheels    Recommendations for Other Services       Precautions / Restrictions Precautions Precautions: Fall Precaution Comments: on O2, monitor sats/HR Restrictions Weight Bearing Restrictions: No    Mobility  Bed Mobility Overal bed mobility: Needs Assistance       Supine to sit: Supervision     General bed mobility comments: extra time but able to self perform  Transfers Overall transfer level: Needs assistance Equipment used: None Transfers: Sit to/from Stand Sit to Stand: Supervision;Min guard         General transfer comment: good safety cognition and use of hands to steady self  Ambulation/Gait Ambulation/Gait assistance: Supervision;Min guard Ambulation Distance (Feet): 125 Feet Assistive device: Rolling walker (2 wheeled) Gait Pattern/deviations: Step-to pattern Gait velocity: WFL   General Gait Details: used a RW this time due to increased c/o feeling "bad".  Amb on 3 lts O2 sats avg 88% with HR 107.  Limited activity tolerance.  Demonstrates 3/4 DOE.     Stairs            Wheelchair Mobility    Modified  Rankin (Stroke Patients Only)       Balance                                    Cognition Arousal/Alertness: Awake/alert Behavior During Therapy: WFL for tasks assessed/performed Overall Cognitive Status: Within Functional Limits for tasks assessed                 General Comments: slightly tearful    Exercises      General Comments        Pertinent Vitals/Pain Pain Assessment: No/denies pain    Home Living                      Prior Function            PT Goals (current goals can now be found in the care plan section) Progress towards PT goals: Progressing toward goals    Frequency  Min 3X/week    PT Plan Current plan remains appropriate    Co-evaluation             End of Session Equipment Utilized During Treatment: Oxygen;Gait belt Activity Tolerance: Patient limited by pain;Patient limited by fatigue;Treatment limited secondary to medical complications (Comment) Patient left: in bed;with call bell/phone within reach     Time: 1010-1035 PT Time Calculation (min) (ACUTE ONLY): 25 min  Charges:  $Gait Training: 8-22 mins $Therapeutic Activity: 8-22 mins  G Codes:      Rica Koyanagi  PTA [facility]  Acute  Rehab Pager      [phone]

## 2015-03-31 NOTE — Progress Notes (Signed)
Date:  Oct. 11, 2016 U.R. performed for needs and level of care. Will continue to follow for Case Management needs.  Velva Harman, RN, BSN, [place]   [phone]

## 2015-03-31 NOTE — Discharge Summary (Addendum)
Physician Discharge Summary  Traci Thomas PZW:[id number] DOB: [birth year] DOA: 03/25/2015  PCP: [name], NP  Admit date: 03/25/2015 Discharge date:  pending  Recommendations for Outpatient Follow-up:  1. Home with hospice   Discharge Diagnoses:  Active Problems:   COPD exacerbation (HCC)   Acute on chronic respiratory failure with hypoxia (HCC)   Somnolence   Acute delirium   Diabetes mellitus type 2 in nonobese [facility])   Depression   Encounter for palliative care   Discharge Condition: fair  Diet recommendation: diabetic  Wt Readings from Last 3 Encounters:  03/25/15 40.1 kg (88 lb 6.5 oz)    History of present illness:    The patient is a 64 y.o. year-old female with history of COPD on 3L [place] with ongoing smoking, benzodiazepine dependence, chronic pain on chronic narcotics, depression, diabetes mellitus type 2, bladder spasms who presented with mechanical fall and increased SOB.Traci Thomas tripped over a portable toilet and hit Traci Thomas head, Traci Thomas left arm, and landed on Traci Thomas hip. Traci Thomas came to the ER because of Traci Thomas fall. Incidentally, Traci Thomas reported increased SOB with productive cough. Traci Thomas denied fevers. Traci Thomas wears 3L home oxygen which Traci Thomas removes intermittently to smoke. Traci Thomas O2 sat was 78% on Traci Thomas home 3L. Bibasilar pleural thickening on CXR. XR of the left formearm and pelvis were wnl. CT head and cervical spine demionstrated no acute injury but moderate diffuse cortical atrophy and moderate chronic ischemic white matter disease. Traci Thomas was given Duoneb, Magnesium, solumedrol, ativan, zofran, and ceftriaxone. Traci Thomas had significant tremors in ER which Traci Thomas attributed to not taking Traci Thomas xanax today. Traci Thomas does not take Traci Thomas xanax as prescribed. Traci Thomas reported taking 2 tablets with breakfast and then taking additional doses throughout the day as needed, up to four times per day. Traci Thomas was being admitted to stepdown due to current somnolence and confusion in the setting of possible benzodiazepine  withdrawal and COPD exacerbation.   Hospital Course:    Somnolence and delirium, possibly due to concussion, overmedication with multiple sedating medications, acute hypoxia due to COPD exacerbation.Ethanol level neg and UDS c/w with home medications I also suspect that Traci Thomas has some sundowning and has some underlying dementia. Traci Thomas may continue xanax three times daily at home. Traci Thomas was started on seroquel to help with sleep and confusion at night. Traci Thomas continued to have episodic confusion and memory problems and requires assistance with getting meals, going to the bathroom and therefore is not safe to return home by herself. Due to Traci Thomas frailty, malnutrition, end-stage COPD, recurrent hospitalizations, I think that Traci Thomas is a good candidate for hospice. Traci Thomas and Traci Thomas family decided to enroll in hospice and Traci Thomas has agreed to move in with Traci Thomas son.   Acute on chronic hypoxic respiratory failure secondary to acute COPD exacerbation. Traci Thomas has severe COPD with persistent high oxygen requirements, ongoing dyspnea. This is Traci Thomas fourth hospitalization in the last 6 months for COPD-related issues. Traci Thomas was started on solumedrol and duonebs with budesonide and brovana. Traci Thomas continued theophylline and was given ceftriaxone. Traci Thomas continued to require high amounts of oxygen and have oxygen desaturations, particularly with exertion. Traci Thomas steroids were gradually tapered and Traci Thomas was transitioned prednisolone to continue a long taper of steroids at home. Traci Thomas should continue inhaled corticosteroid and LABA and spiriva at home with albuterol as needed for shortness of breath. Traci Thomas was given a prescription for morphine to use as needed for pain or for shortness of breath. Traci Thomas medications should be closely monitored by Traci Thomas family to prevent Traci Thomas  from overusing Traci Thomas medications or having an accidental overdose.   Myoclonus/tremor, likely due to benzodiazepine dependence and frequent beta agonist use. Theophylline level was okay  and Traci Thomas tremor gradually improved during Traci Thomas hospitalization.   Mechanical fall, no evidence of fracture or head/neck trauma. Home with hospice.  Diabetes mellitus type 2, with low normal CBG. A1c 6.5. Traci Thomas required very little insulin even on high dose steroids. Traci Thomas goal A1c given Traci Thomas comorbidities is closer to 7. I have discontinued Traci Thomas metformin to minimize Traci Thomas pill burden.   Depression, stable, continued pristiq  Prolonged QTc. Avoided QTc prolonging medications.   Underweight, Traci Thomas met with the nutritionist and was started on supplements.   Diet: diabetic diet Access: PIV IVF: off Proph: lovenox  Code Status: DNR Family Communication: patient and son and daughter-in-law Disposition Plan: Plan to discharge home with hospice care in the morning.   Consultants:  None  Procedures:  CXR  Antibiotics:  Ceftriaxone 10/5 > 10/9  Discharge Exam: Filed Vitals:   03/31/15 1329  BP: 145/75  Pulse: 114  Temp: 98.3 F (36.8 C)  Resp: 22   Filed Vitals:   03/31/15 0821 03/31/15 1145 03/31/15 1329 03/31/15 1549  BP:   145/75   Pulse:   114   Temp:   98.3 F (36.8 C)   TempSrc:   Oral   Resp:   22   Height:      Weight:      SpO2: 88% 94% 91% 91%     General: Cachectic female, No acute distress, crying  HEENT: NCAT, MMM  Cardiovascular: RRR, nl S1, S2 no mrg, 2+ pulses, warm extremities  Respiratory: Very diminished bilateral BS, medium pitched full wheeze, no rales. Less cough. no increased WOB  Abdomen: NABS, soft, NT/ND  MSK: Normal tone and bulk, no LEE  Neuro: Faint tremor.  Discharge Instructions      Discharge Instructions    Call MD for:  difficulty breathing, headache or visual disturbances    Complete by:  As directed      Call MD for:  extreme fatigue    Complete by:  As directed      Call MD for:  hives    Complete by:  As directed      Call MD for:  persistant dizziness or light-headedness    Complete by:  As  directed      Call MD for:  persistant nausea and vomiting    Complete by:  As directed      Call MD for:  severe uncontrolled pain    Complete by:  As directed      Call MD for:  temperature >100.4    Complete by:  As directed      Diet general    Complete by:  As directed      Increase activity slowly    Complete by:  As directed             Medication List    STOP taking these medications        cetirizine 5 MG tablet  Commonly known as:  ZYRTEC     DYMISTA 137-50 MCG/ACT Susp  Generic drug:  Azelastine-Fluticasone     HYDROcodone-acetaminophen 10-325 MG tablet  Commonly known as:  NORCO     metFORMIN 500 MG tablet  Commonly known as:  GLUCOPHAGE     potassium chloride SA 20 MEQ tablet  Commonly known as:  K-DUR,KLOR-CON     Vitamin D (Ergocalciferol) 50000  UNITS Caps capsule  Commonly known as:  DRISDOL      TAKE these medications        albuterol 108 (90 BASE) MCG/ACT inhaler  Commonly known as:  PROVENTIL HFA;VENTOLIN HFA  Inhale 2 puffs into the lungs every 6 (six) hours as needed for wheezing or shortness of breath.     albuterol (2.5 MG/3ML) 0.083% nebulizer solution  Commonly known as:  PROVENTIL  Take 3 mLs (2.5 mg total) by nebulization every 6 (six) hours as needed for wheezing or shortness of breath.     ALPRAZolam 0.5 MG tablet  Commonly known as:  XANAX  Take 1 tablet (0.5 mg total) by mouth 3 (three) times daily.     desvenlafaxine 100 MG 24 hr tablet  Commonly known as:  PRISTIQ  Take 100 mg by mouth daily.     DULERA 200-5 MCG/ACT Aero  Generic drug:  mometasone-formoterol  Inhale 2 puffs into the lungs 2 (two) times daily.     feeding supplement (GLUCERNA SHAKE) Liqd  Take 237 mLs by mouth 2 (two) times daily between meals.     lansoprazole 30 MG capsule  Commonly known as:  PREVACID  Take 30 mg by mouth 2 (two) times daily before a meal.     morphine 20 MG/ML concentrated solution  Commonly known as:  ROXANOL  Take 0.5 mLs (10  mg total) by mouth every 2 (two) hours as needed for moderate pain, severe pain, anxiety or shortness of breath.     nystatin cream  Commonly known as:  MYCOSTATIN  Apply 1 application topically 2 (two) times daily.     oxybutynin 5 MG tablet  Commonly known as:  DITROPAN  Take 5 mg by mouth at bedtime.     prednisoLONE 15 MG/5ML Soln  Commonly known as:  PRELONE  Take 8m daily for 3 days, then 171mdaily for three days, then 61m861maily for 4 days, then stop     QUEtiapine 25 MG tablet  Commonly known as:  SEROQUEL  Take 1 tablet (25 mg total) by mouth at bedtime.     theophylline 300 MG 12 hr tablet  Commonly known as:  THEODUR  Take 300 mg by mouth 2 (two) times daily.     tiotropium 18 MCG inhalation capsule  Commonly known as:  SPIRIVA HANDIHALER  Place 1 capsule (18 mcg total) into inhaler and inhale daily.     triamcinolone cream 0.1 %  Commonly known as:  KENALOG  Apply 1 application topically 2 (two) times daily.     VOLTAREN 1 % Gel  Generic drug:  diclofenac sodium  Place 1 application onto the skin 4 (four) times daily as needed. Joint pain.       Follow-up Information    Schedule an appointment as soon as possible for a visit with YORImagene RichesP.   Why:  As needed   Contact information:   [address] [phone]     The results of significant diagnostics from this hospitalization (including imaging, microbiology, ancillary and laboratory) are listed below for reference.    Significant Diagnostic Studies: Dg Pelvis 1-2 Views  03/25/2015   CLINICAL DATA:  Acute pelvic pain after fall in bathroom today.  EXAM: PELVIS - 1-2 VIEW  COMPARISON:  March 30, 2012.  FINDINGS: There is no evidence of pelvic fracture or diastasis. No pelvic bone lesions are seen. Hip and sacroiliac joints appear normal.  IMPRESSION: Normal  pelvis.   Electronically Signed   By: Marijo Conception, M.D.   On: 03/25/2015 14:47   Dg Forearm  Left  03/25/2015   CLINICAL DATA:  Fall.  EXAM: LEFT FOREARM - 2 VIEW  COMPARISON:  None.  FINDINGS: Diffuse osteopenia. Degenerative change noted about the left wrist and elbow. No acute bony or joint abnormality.  IMPRESSION: Negative.   Electronically Signed   By: Marcello Moores  Register   On: 03/25/2015 14:47   Ct Head Wo Contrast  03/25/2015   CLINICAL DATA:  Posttraumatic headaches and left-sided neck pain after fall this morning.  EXAM: CT HEAD WITHOUT CONTRAST  CT CERVICAL SPINE WITHOUT CONTRAST  TECHNIQUE: Multidetector CT imaging of the head and cervical spine was performed following the standard protocol without intravenous contrast. Multiplanar CT image reconstructions of the cervical spine were also generated.  COMPARISON:  None.  FINDINGS: CT HEAD FINDINGS  Bony calvarium appears intact. Moderate diffuse cortical atrophy is noted. Moderate chronic ischemic white matter disease is noted. 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 No mass effect or midline shift is noted. Ventricular size is within normal limits. There is no evidence of mass lesion, hemorrhage or acute infarction.  CT CERVICAL SPINE FINDINGS  No fracture or spondylolisthesis is noted. Disc spaces and posterior facet joints appear intact. Visualized lung apices appear normal.  IMPRESSION: Moderate diffuse cortical atrophy. Moderate chronic ischemic white matter disease. No acute intracranial abnormality seen.  No significant abnormality seen in the cervical spine.   Electronically Signed   By: Marijo Conception, M.D.   On: 03/25/2015 15:15   Ct Cervical Spine Wo Contrast  03/25/2015   CLINICAL DATA:  Posttraumatic headaches and left-sided neck pain after fall this morning.  EXAM: CT HEAD WITHOUT CONTRAST  CT CERVICAL SPINE WITHOUT CONTRAST  TECHNIQUE: Multidetector CT imaging of the head and cervical spine was performed following the standard protocol without intravenous contrast. Multiplanar CT image reconstructions of the  cervical spine were also generated.  COMPARISON:  None.  FINDINGS: CT HEAD FINDINGS  Bony calvarium appears intact. Moderate diffuse cortical atrophy is noted. Moderate chronic ischemic white matter disease is noted. 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 No mass effect or midline shift is noted. Ventricular size is within normal limits. There is no evidence of mass lesion, hemorrhage or acute infarction.  CT CERVICAL SPINE FINDINGS  No fracture or spondylolisthesis is noted. Disc spaces and posterior facet joints appear intact. Visualized lung apices appear normal.  IMPRESSION: Moderate diffuse cortical atrophy. Moderate chronic ischemic white matter disease. No acute intracranial abnormality seen.  No significant abnormality seen in the cervical spine.   Electronically Signed   By: Marijo Conception, M.D.   On: 03/25/2015 15:15   Dg Chest Port 1 View  03/25/2015   CLINICAL DATA:  Shortness of breath.  Fall.  EXAM: PORTABLE CHEST 1 VIEW  COMPARISON:  01/18/2014.  01/16/2015.  06/27/2014.  FINDINGS: Mediastinum and hilar structures are normal. Bibasilar pleural parenchymal thickening again noted consistent with scarring. Heart size normal. No pleural effusion or pneumothorax. No acute bony abnormality .  IMPRESSION: Bibasilar pleural parenchymal thickening noted consistent with scarring.   Electronically Signed   By: Marcello Moores  Register   On: 03/25/2015 13:36    Microbiology: Recent Results (from the past 240 hour(s))  Blood culture (routine x 2)     Status: None   Collection Time: 03/25/15  3:23 PM  Result Value Ref Range Status   Specimen  Description BLOOD LEFT HAND  Final   Special Requests BOTTLES DRAWN AEROBIC AND ANAEROBIC 5CC  Final   Culture   Final    NO GROWTH 5 DAYS Performed at [facility]    Report Status 03/30/2015 FINAL  Final  Blood culture (routine x 2)     Status: None   Collection Time: 03/25/15  6:35 PM  Result Value Ref Range Status   Specimen Description  BLOOD LEFT ANTECUBITAL  Final   Special Requests BOTTLES DRAWN AEROBIC ONLY 4CC  Final   Culture   Final    NO GROWTH 5 DAYS Performed at [facility]    Report Status 03/30/2015 FINAL  Final  MRSA PCR Screening     Status: None   Collection Time: 03/25/15 10:30 PM  Result Value Ref Range Status   MRSA by PCR NEGATIVE NEGATIVE Final    Comment:        The GeneXpert MRSA Assay (FDA approved for NASAL specimens only), is one component of a comprehensive MRSA colonization surveillance program. It is not intended to diagnose MRSA infection nor to guide or monitor treatment for MRSA infections.      Labs: Basic Metabolic Panel:  Recent Labs Lab 03/27/15 0335 03/28/15 0555 03/29/15 0552 03/30/15 0513 03/31/15 0605  NA 142 142 142 141 143  K 4.1 4.7 4.2 4.5 4.5  CL 105 99* 98* 98* 97*  CO2 31 35* 38* 37* 40*  GLUCOSE 151* 152* 139* 164* 106*  BUN 21* 26* 28* 25* 30*  CREATININE 0.68 0.67 0.78 0.66 0.76  CALCIUM 9.3 9.3 9.2 9.2 9.4   Liver Function Tests:  Recent Labs Lab 03/25/15 1318  AST 14*  ALT 9*  ALKPHOS 103  BILITOT 0.3  PROT 7.5  ALBUMIN 4.7    Recent Labs Lab 03/25/15 1318  LIPASE 22    Recent Labs Lab 03/25/15 1835  AMMONIA 15   CBC:  Recent Labs Lab 03/25/15 1318  03/27/15 0335 03/28/15 0555 03/29/15 0552 03/30/15 0513 03/31/15 0605  WBC 7.0  < > 9.1 8.1 7.8 7.9 9.1  NEUTROABS 5.9  --   --   --   --   --   --   HGB 14.4  < > 10.6* 11.3* 11.8* 11.5* 11.8*  HCT 44.8  < > 34.1* 35.9* 36.7 37.2 38.3  MCV 92.9  < > 93.9 95.0 95.3 95.9 94.8  PLT 235  < > 200 216 215 233 222  < > = values in this interval not displayed. Cardiac Enzymes: No results for input(s): CKTOTAL, CKMB, CKMBINDEX, TROPONINI in the last 168 hours. BNP: BNP (last 3 results) No results for input(s): BNP in the last 8760 hours.  ProBNP (last 3 results) No results for input(s): PROBNP in the last 8760 hours.  CBG:  Recent Labs Lab 03/30/15 1627  03/30/15 2217 03/31/15 0720 03/31/15 1100 03/31/15 1619  GLUCAP 214* 160* 102* 117* 236*    Time coordinating discharge: 35 minutes  Signed:  ,   [facility] 03/31/2015, 6:30 PM

## 2015-03-31 NOTE — Progress Notes (Signed)
[id number] Davis,RN,BSN,CCM:  Spoke with son and daughter in law patient will be going home with the son to live, has o2 and c-pap established through [facility], has needed dme in place in the home and the home is handicapped equipped have chosen [facility] as the provider..  [id number]/tct-[facility] referral given with phone numbers to contact family.

## 2015-03-31 NOTE — Progress Notes (Signed)
Notified by Julianne Handler of family request for [facility] at home after discharge. Chart and patient Information currently under review to confirm hospice eligibility.   Spoke with patient, at bedside to initiate education related to hospice philosophy, services and team approach to care. Family verbalized understanding of the information provided. Per discussion plan is for discharge to home by personal vehicle with son and daughter-in-law as early as tomorrow.   Please send signed completed DNR form home with patient.  Patient will need prescriptions for discharge comfort medications.   DME needs discussed and family stated DME is already in home.   [facility] aware of the above.  Completed discharge summary will need to be faxed to [facility] at [phone] when final.  Please notify [facility] when patient is ready to leave unit at discharge-call [phone].   [facility] information and contact numbers have been given to patient during visit.   Please call with any questions.  Freddi Starr RN, [facility] Liaison  [phone]

## 2015-03-31 NOTE — Progress Notes (Signed)
Nutrition Follow-up  DOCUMENTATION CODES:   Underweight  INTERVENTION:  - Will change Ensure Enlive to Glucerna Shake BID, each supplement provides 220 kcal and 10 grams of protein - RD will continue to monitor for needs  NUTRITION DIAGNOSIS:   Increased nutrient needs related to  (COPD) as evidenced by estimated needs -ongoing  GOAL:   Patient will meet greater than or equal to 90% of their needs -met on average  MONITOR:   PO intake, Supplement acceptance, Labs, Weight trends, Skin, I & O's  ASSESSMENT:   64 y.o. year-old female with history of COPD on 3L [place] with ongoing smoking, benzodiazepine dependence, chronic pain on chronic narcotics, depression, diabetes mellitus type 2, bladder spasms who presented with mechanical fall and increased SOB.She tripped over a portable toilet and hit her head, her left arm, and landed on her hip.  10/11 Pt eating 50-100% at all meals 10/9-10/11. Ensure Enlive order BID but will switch to Glucerna Shake given hyperglycemia. Per notes, pt to d/c with [facility]. Meeting needs on average. Medications reviewed. Labs reviewed; CBGs: 74-323 mg/dL, Cl: 97 mmol/L, BUN elevated and trending up.    10/6 - Pt reports N/V this past week and not eating well.  - Pt states that she does drink Ensures when she can afford them.  - Pt currently eating ~75% of her meals.  - RD to order Ensures BID. - Pt states her UBW is around 85-90 lb. Pt is underweight. - Nutrition-Focused physical exam completed. Findings are moderate fat depletion, no muscle depletion, and no edema.   Diet Order:  Diet Carb Modified Fluid consistency:: Thin; Room service appropriate?: Yes  Skin:  Reviewed, no issues  Last BM:  10/11  Height:   Ht Readings from Last 1 Encounters:  03/25/15 4' 11.5" (1.511 m)    Weight:   Wt Readings from Last 1 Encounters:  03/25/15 88 lb 6.5 oz (40.1 kg)    Ideal Body Weight:  44.8 kg  BMI:  Body mass index is 17.56  kg/(m^2).  Estimated Nutritional Needs:   Kcal:  1300-1500  Protein:  65-75g  Fluid:  1.5L/day  EDUCATION NEEDS:   Education needs addressed      Jarome Matin, RD, LDN Inpatient Clinical Dietitian Pager # [phone] After hours/weekend pager # [phone]

## 2015-04-01 LAB — GLUCOSE, CAPILLARY: Glucose-Capillary: 95 mg/dL (ref 65–99)

## 2015-04-01 NOTE — Care Management Note (Signed)
Case Management Note  Patient Details  Name: Traci Thomas MRN: [id number] Date of Birth: Sep 01, 1950  Subjective/Objective: [facility] spoke to [facility] aware of d/c & have patient scheduled for home visit this evening when family calls.Noted DME has already been delivered from prior note.                   Action/Plan:d/c home w/home hospice services from [facility].   Expected Discharge Date:   (unknown)               Expected Discharge Plan:  Home w Hospice Care  In-House Referral:     Discharge planning Services  CM Consult  Post Acute Care Choice:  Home Health Choice offered to:  Patient  DME Arranged:    DME Agency:     HH Arranged:  RN [place] Agency:  [facility]  Status of Service:  Completed, signed off  Medicare Important Message Given:  Yes-second notification given Date Medicare IM Given:    Medicare IM give by:    Date Additional Medicare IM Given:    Additional Medicare Important Message give by:     If discussed at [place] of Stay Meetings, dates discussed:    Additional Comments:  Dessa Phi, RN 04/01/2015, 10:57 AM

## 2015-04-01 NOTE — Progress Notes (Signed)
Discharge instructions given to pt/family, verbalized understanding. Left the unit in stable condition. 

## 2015-04-01 NOTE — Progress Notes (Signed)
[facility] PROGRESS NOTE    Progress Note   Traci Thomas [id number] DOB: 1950/11/19 DOA: 03/25/2015 PCP: [name], NP   Brief Narrative:   Traci Thomas is an 64 y.o. female with COPD oxygen dependent  Assessment/Plan:   Active Problems:   COPD exacerbation ([place])   Acute on chronic respiratory failure with hypoxia ([place])   Somnolence   Acute delirium   Diabetes mellitus type 2 in nonobese [facility])   Depression   Encounter for palliative care After long discussion with family hospice was consulted due to multiple comorbidities and deconditioning and worsening over the last several months the family decided to move towards hospice care. She will continue on oxygen depression medication and oral morphine. She relates her breathing is comfortable.      DVT Prophylaxis - Lovenox ordered.  Family Communication: none Disposition Plan: Home when stable. Code Status:     Code Status Orders        Start     Ordered   03/29/15 0903  Do not attempt resuscitation (DNR)   Continuous    Question Answer Comment  In the event of cardiac or respiratory ARREST Do not call a "code blue"   In the event of cardiac or respiratory ARREST Do not perform Intubation, CPR, defibrillation or ACLS   In the event of cardiac or respiratory ARREST Use medication by any route, position, wound care, and other measures to relive pain and suffering. May use oxygen, suction and manual treatment of airway obstruction as needed for comfort.      03/29/15 0902        IV Access:    Peripheral IV   Procedures and diagnostic studies:   No results found.   Medical Consultants:    None.  Anti-Infectives:   Anti-infectives    Start     Dose/Rate Route Frequency Ordered Stop   03/26/15 1600  cefTRIAXone (ROCEPHIN) 1 g in dextrose 5 % 50 mL IVPB     1 g 100 mL/hr over 30 Minutes Intravenous Every 24 hours 03/25/15 1841 03/27/15 1530   03/25/15 1515  cefTRIAXone (ROCEPHIN) 1 g  in dextrose 5 % 50 mL IVPB     1 g 100 mL/hr over 30 Minutes Intravenous  Once 03/25/15 1509 03/25/15 1635      Subjective:    Traci Thomas   Patient with labile emotion Objective:    Filed Vitals:   03/31/15 2022 03/31/15 2106 04/01/15 0505 04/01/15 0814  BP:  140/78 145/88   Pulse:  105 92   Temp:  98.5 F (36.9 C) 98.7 F (37.1 C)   TempSrc:  Oral Oral   Resp:  20 21   Height:      Weight:      SpO2: 92% 93% 98% 90%    Intake/Output Summary (Last 24 hours) at 04/01/15 0950 Last data filed at 03/31/15 1820  Gross per 24 hour  Intake    480 ml  Output    550 ml  Net    -70 ml   Filed Weights   03/25/15 1720  Weight: 40.1 kg (88 lb 6.5 oz)    Exam: Gen:  NAD Cardiovascular:  RRR, No M/R/G Respiratory:  Lungs CTAB Gastrointestinal:  Abdomen soft, NT/ND, + BS Extremities:  No C/E/C   Data Reviewed:    Labs: Basic Metabolic Panel:  Recent Labs Lab 03/27/15 0335 03/28/15 0555 03/29/15 0552 03/30/15 0513 03/31/15 0605  NA 142 142 142 141 143  K 4.1 4.7 4.2 4.5 4.5  CL 105 99* 98* 98* 97*  CO2 31 35* 38* 37* 40*  GLUCOSE 151* 152* 139* 164* 106*  BUN 21* 26* 28* 25* 30*  CREATININE 0.68 0.67 0.78 0.66 0.76  CALCIUM 9.3 9.3 9.2 9.2 9.4   GFR Estimated Creatinine Clearance: 46.2 mL/min (by C-G formula based on Cr of 0.76). Liver Function Tests:  Recent Labs Lab 03/25/15 1318  AST 14*  ALT 9*  ALKPHOS 103  BILITOT 0.3  PROT 7.5  ALBUMIN 4.7    Recent Labs Lab 03/25/15 1318  LIPASE 22    Recent Labs Lab 03/25/15 1835  AMMONIA 15   Coagulation profile No results for input(s): INR, PROTIME in the last 168 hours.  CBC:  Recent Labs Lab 03/25/15 1318  03/27/15 0335 03/28/15 0555 03/29/15 0552 03/30/15 0513 03/31/15 0605  WBC 7.0  < > 9.1 8.1 7.8 7.9 9.1  NEUTROABS 5.9  --   --   --   --   --   --   HGB 14.4  < > 10.6* 11.3* 11.8* 11.5* 11.8*  HCT 44.8  < > 34.1* 35.9* 36.7 37.2 38.3  MCV 92.9  < > 93.9 95.0 95.3 95.9  94.8  PLT 235  < > 200 216 215 233 222  < > = values in this interval not displayed. Cardiac Enzymes: No results for input(s): CKTOTAL, CKMB, CKMBINDEX, TROPONINI in the last 168 hours. BNP (last 3 results) No results for input(s): PROBNP in the last 8760 hours. CBG:  Recent Labs Lab 03/31/15 0720 03/31/15 1100 03/31/15 1619 03/31/15 2103 04/01/15 0723  GLUCAP 102* 117* 236* 212* 95   D-Dimer: No results for input(s): DDIMER in the last 72 hours. Hgb A1c: No results for input(s): HGBA1C in the last 72 hours. Lipid Profile: No results for input(s): CHOL, HDL, LDLCALC, TRIG, CHOLHDL, LDLDIRECT in the last 72 hours. Thyroid function studies: No results for input(s): TSH, T4TOTAL, T3FREE, THYROIDAB in the last 72 hours.  Invalid input(s): FREET3 Anemia work up: No results for input(s): VITAMINB12, FOLATE, FERRITIN, TIBC, IRON, RETICCTPCT in the last 72 hours. Sepsis Labs:  Recent Labs Lab 03/28/15 0555 03/29/15 0552 03/30/15 0513 03/31/15 0605  WBC 8.1 7.8 7.9 9.1   Microbiology Recent Results (from the past 240 hour(s))  Blood culture (routine x 2)     Status: None   Collection Time: 03/25/15  3:23 PM  Result Value Ref Range Status   Specimen Description BLOOD LEFT HAND  Final   Special Requests BOTTLES DRAWN AEROBIC AND ANAEROBIC 5CC  Final   Culture   Final    NO GROWTH 5 DAYS Performed at [facility]    Report Status 03/30/2015 FINAL  Final  Blood culture (routine x 2)     Status: None   Collection Time: 03/25/15  6:35 PM  Result Value Ref Range Status   Specimen Description BLOOD LEFT ANTECUBITAL  Final   Special Requests BOTTLES DRAWN AEROBIC ONLY 4CC  Final   Culture   Final    NO GROWTH 5 DAYS Performed at [facility]    Report Status 03/30/2015 FINAL  Final  MRSA PCR Screening     Status: None   Collection Time: 03/25/15 10:30 PM  Result Value Ref Range Status   MRSA by PCR NEGATIVE NEGATIVE Final    Comment:        The GeneXpert  MRSA Assay (FDA approved for NASAL specimens only), is one component of a  comprehensive MRSA colonization surveillance program. It is not intended to diagnose MRSA infection nor to guide or monitor treatment for MRSA infections.      Medications:   . ALPRAZolam  0.5 mg Oral TID  . arformoterol  15 mcg Nebulization BID  . azelastine  1 spray Each Nare BID   And  . fluticasone  1 spray Each Nare BID  . budesonide (PULMICORT) nebulizer solution  0.25 mg Nebulization BID  . enoxaparin (LOVENOX) injection  20 mg Subcutaneous Q24H  . feeding supplement (GLUCERNA SHAKE)  237 mL Oral BID BM  . insulin aspart  0-15 Units Subcutaneous TID WC  . insulin aspart  0-5 Units Subcutaneous QHS  . ipratropium-albuterol  3 mL Nebulization QID  . multivitamin with minerals  1 tablet Oral Daily  . nicotine  21 mg Transdermal Daily  . oxybutynin  5 mg Oral QHS  . pantoprazole  40 mg Oral Daily  . prednisoLONE  60 mg Oral Daily  . QUEtiapine  25 mg Oral QHS  . theophylline  300 mg Oral Q12H  . venlafaxine XR  150 mg Oral Q breakfast   Continuous Infusions:   Time spent: 15 min   LOS: 7 days   Charlynne Cousins  [facility] Pager [phone]  *Please refer to [email or web address], password [email or web address] to get updated schedule on who will round on this patient, as hospitalists switch teams weekly. If 7PM-7AM, please contact night-coverage at [email or web address], password [email or web address] for any overnight needs.  04/01/2015, 9:50 AM

## 2015-10-20 ENCOUNTER — Encounter (HOSPITAL_COMMUNITY): Payer: Self-pay | Admitting: Emergency Medicine

## 2015-10-20 ENCOUNTER — Inpatient Hospital Stay (HOSPITAL_COMMUNITY)
Admission: EM | Admit: 2015-10-20 | Discharge: 2015-10-24 | DRG: 480 | Disposition: A | Discharge status: Skilled Nursing Facility | Attending: Internal Medicine | Admitting: Internal Medicine

## 2015-10-20 ENCOUNTER — Emergency Department (HOSPITAL_COMMUNITY)

## 2015-10-20 DIAGNOSIS — R0602 Shortness of breath: Secondary | ICD-10-CM

## 2015-10-20 DIAGNOSIS — K5900 Constipation, unspecified: Secondary | ICD-10-CM | POA: Diagnosis present

## 2015-10-20 DIAGNOSIS — M549 Dorsalgia, unspecified: Secondary | ICD-10-CM | POA: Diagnosis present

## 2015-10-20 DIAGNOSIS — Z9981 Dependence on supplemental oxygen: Secondary | ICD-10-CM

## 2015-10-20 DIAGNOSIS — Z833 Family history of diabetes mellitus: Secondary | ICD-10-CM

## 2015-10-20 DIAGNOSIS — J440 Chronic obstructive pulmonary disease with acute lower respiratory infection: Secondary | ICD-10-CM | POA: Diagnosis present

## 2015-10-20 DIAGNOSIS — R296 Repeated falls: Secondary | ICD-10-CM | POA: Diagnosis present

## 2015-10-20 DIAGNOSIS — W19XXXA Unspecified fall, initial encounter: Secondary | ICD-10-CM | POA: Diagnosis present

## 2015-10-20 DIAGNOSIS — J449 Chronic obstructive pulmonary disease, unspecified: Secondary | ICD-10-CM | POA: Diagnosis present

## 2015-10-20 DIAGNOSIS — Z681 Body mass index (BMI) 19 or less, adult: Secondary | ICD-10-CM

## 2015-10-20 DIAGNOSIS — R0902 Hypoxemia: Secondary | ICD-10-CM

## 2015-10-20 DIAGNOSIS — S72001A Fracture of unspecified part of neck of right femur, initial encounter for closed fracture: Secondary | ICD-10-CM

## 2015-10-20 DIAGNOSIS — E43 Unspecified severe protein-calorie malnutrition: Secondary | ICD-10-CM | POA: Diagnosis present

## 2015-10-20 DIAGNOSIS — D649 Anemia, unspecified: Secondary | ICD-10-CM | POA: Diagnosis present

## 2015-10-20 DIAGNOSIS — J9621 Acute and chronic respiratory failure with hypoxia: Secondary | ICD-10-CM | POA: Diagnosis present

## 2015-10-20 DIAGNOSIS — Y92009 Unspecified place in unspecified non-institutional (private) residence as the place of occurrence of the external cause: Secondary | ICD-10-CM

## 2015-10-20 DIAGNOSIS — Z7189 Other specified counseling: Secondary | ICD-10-CM

## 2015-10-20 DIAGNOSIS — F0390 Unspecified dementia without behavioral disturbance: Secondary | ICD-10-CM | POA: Diagnosis present

## 2015-10-20 DIAGNOSIS — E119 Type 2 diabetes mellitus without complications: Secondary | ICD-10-CM | POA: Diagnosis present

## 2015-10-20 DIAGNOSIS — Z419 Encounter for procedure for purposes other than remedying health state, unspecified: Secondary | ICD-10-CM

## 2015-10-20 DIAGNOSIS — Z66 Do not resuscitate: Secondary | ICD-10-CM | POA: Diagnosis present

## 2015-10-20 DIAGNOSIS — Z825 Family history of asthma and other chronic lower respiratory diseases: Secondary | ICD-10-CM

## 2015-10-20 DIAGNOSIS — S72009A Fracture of unspecified part of neck of unspecified femur, initial encounter for closed fracture: Secondary | ICD-10-CM | POA: Diagnosis present

## 2015-10-20 DIAGNOSIS — S72141A Displaced intertrochanteric fracture of right femur, initial encounter for closed fracture: Principal | ICD-10-CM | POA: Diagnosis present

## 2015-10-20 DIAGNOSIS — F17200 Nicotine dependence, unspecified, uncomplicated: Secondary | ICD-10-CM | POA: Diagnosis present

## 2015-10-20 DIAGNOSIS — M25551 Pain in right hip: Secondary | ICD-10-CM | POA: Diagnosis not present

## 2015-10-20 DIAGNOSIS — J189 Pneumonia, unspecified organism: Secondary | ICD-10-CM | POA: Diagnosis present

## 2015-10-20 MED ORDER — NALOXONE HCL 2 MG/2ML IJ SOSY
1.0000 mg | PREFILLED_SYRINGE | Freq: Once | INTRAMUSCULAR | Status: AC
  Administered 2015-10-20: 1 mg via INTRAVENOUS
  Filled 2015-10-20: qty 2

## 2015-10-20 NOTE — ED Notes (Signed)
Bed: [id number] Expected date:  Expected time:  Means of arrival:  Comments: EMS 65 yo female hospice patient-fall/shortening right leg/morphine by health care aide

## 2015-10-20 NOTE — ED Provider Notes (Signed)
CSN: [id number]     Arrival date & time 10/20/15  2254 History   First MD Initiated Contact with Patient 10/20/15 2306     Chief Complaint  Patient presents with  . Fall    HPI   Traci Thomas is a 65 y.o. female with a PMH of DM, COPD on home 3L, dementia who presents to the ED s/p fall. Patient is a poor historian, though per EMS reports, she tried to walk today prior to arrival and had a witnessed fall. She did not hit her head or lose consciousness. She lives at home with her son, and her hospice nurse was called. She was given morphine prior to arrival.   Past Medical History  Diagnosis Date  . Diabetes mellitus without complication ([place])   . Asthma   . Back pain   . COPD (chronic obstructive pulmonary disease) ([facility])     Home O2 3lpm, theophylline   Past Surgical History  Procedure Laterality Date  . Appendectomy    . Tonsillectomy     Family History  Problem Relation Age of Onset  . COPD Mother   . Diabetes     Social History  Substance Use Topics  . Smoking status: Current Every Day Smoker -- 1.00 packs/day  . Smokeless tobacco: None  . Alcohol Use: 0.0 oz/week    0 Standard drinks or equivalent per week     Comment: occasional beer   OB History    No data available      Review of Systems  Unable to perform ROS: Dementia      Allergies  Adhesive; Ciprofloxacin; and Prednisone  Home Medications   Prior to Admission medications   Medication Sig Start Date End Date Taking? Authorizing Provider  albuterol (PROVENTIL HFA;VENTOLIN HFA) 108 (90 BASE) MCG/ACT inhaler Inhale 2 puffs into the lungs every 6 (six) hours as needed for wheezing or shortness of breath.   Yes Historical Provider, MD  albuterol (PROVENTIL) (2.5 MG/3ML) 0.083% nebulizer solution Take 3 mLs (2.5 mg total) by nebulization every 6 (six) hours as needed for wheezing or shortness of breath. 03/31/15  Yes Janece Canterbury, MD  ALPRAZolam Duanne Moron) 1 MG tablet Take 1 mg by mouth every 6 (six) hours  as needed for anxiety.   Yes Historical Provider, MD  desvenlafaxine (PRISTIQ) 100 MG 24 hr tablet Take 100 mg by mouth daily.   Yes Historical Provider, MD  lansoprazole (PREVACID) 30 MG capsule Take 30 mg by mouth 2 (two) times daily before a meal.   Yes Historical Provider, MD  mometasone-formoterol (DULERA) 200-5 MCG/ACT AERO Inhale 2 puffs into the lungs 2 (two) times daily.   Yes Historical Provider, MD  morphine (ROXANOL) 20 MG/ML concentrated solution Take 0.5 mLs (10 mg total) by mouth every 2 (two) hours as needed for moderate pain, severe pain, anxiety or shortness of breath. 03/31/15  Yes Janece Canterbury, MD  oxybutynin (DITROPAN) 5 MG tablet Take 5 mg by mouth at bedtime.   Yes Historical Provider, MD  prochlorperazine (COMPAZINE) 10 MG tablet Take 10 mg by mouth every 4 (four) hours as needed for nausea or vomiting.   Yes Historical Provider, MD  QUEtiapine (SEROQUEL) 100 MG tablet Take 100 mg by mouth every 6 (six) hours.   Yes Historical Provider, MD  theophylline (THEODUR) 300 MG 12 hr tablet Take 300 mg by mouth 2 (two) times daily.   Yes Historical Provider, MD  tiotropium (SPIRIVA HANDIHALER) 18 MCG inhalation capsule Place 1 capsule (18  mcg total) into inhaler and inhale daily. 03/31/15  Yes Janece Canterbury, MD  Vitamin D, Ergocalciferol, (DRISDOL) 50000 units CAPS capsule Take 50,000 Units by mouth every 7 (seven) days.   Yes Historical Provider, MD  ALPRAZolam Duanne Moron) 0.5 MG tablet Take 1 tablet (0.5 mg total) by mouth 3 (three) times daily. Patient not taking: Reported on 10/21/2015 03/31/15   Janece Canterbury, MD  feeding supplement, GLUCERNA SHAKE, (GLUCERNA SHAKE) LIQD Take 237 mLs by mouth 2 (two) times daily between meals. Patient not taking: Reported on 10/21/2015 03/31/15   Janece Canterbury, MD  prednisoLONE (PRELONE) 15 MG/5ML SOLN Take 26mL daily for 3 days, then 20mL daily for three days, then 17mL daily for 4 days, then stop Patient not taking: Reported on 10/21/2015  03/31/15   Janece Canterbury, MD  QUEtiapine (SEROQUEL) 25 MG tablet Take 1 tablet (25 mg total) by mouth at bedtime. Patient not taking: Reported on 10/21/2015 03/31/15   Janece Canterbury, MD    BP 114/66 mmHg  Pulse 114  Temp(Src) 98.4 F (36.9 C) (Oral)  SpO2 88% Physical Exam  Constitutional: She is oriented to person, place, and time. No distress.  Chronically ill-appearing female in no acute distress.  HENT:  Head: Normocephalic and atraumatic.  Right Ear: External ear normal.  Left Ear: External ear normal.  Nose: Nose normal.  Mouth/Throat: Uvula is midline, oropharynx is clear and moist and mucous membranes are normal.  Eyes: Conjunctivae, EOM and lids are normal. Pupils are equal, round, and reactive to light. Right eye exhibits no discharge. Left eye exhibits no discharge. No scleral icterus.  Neck: Normal range of motion. Neck supple.  Cardiovascular: Normal rate, regular rhythm, normal heart sounds, intact distal pulses and normal pulses.   Pulmonary/Chest: Effort normal and breath sounds normal. No respiratory distress. She has no wheezes. She has no rales.  Abdominal: Soft. Normal appearance and bowel sounds are normal. She exhibits no distension and no mass. There is no tenderness. There is no rigidity, no rebound and no guarding.  Musculoskeletal: Normal range of motion. She exhibits tenderness. She exhibits no edema.  TTP to right lateral hip. Pelvic binding in place. TTP to right anterior knee. TTP to right lateral ankle. Decreased ROM of right lower extremity due to pain. Right lower extremity shortened.  Neurological: She is alert and oriented to person, place, and time. She has normal strength. No cranial nerve deficit or sensory deficit.  Skin: Skin is warm, dry and intact. No rash noted. She is not diaphoretic. No erythema. No pallor.  Psychiatric: She has a normal mood and affect. Her speech is normal and behavior is normal.  Nursing note and vitals  reviewed.   ED Course  Procedures (including critical care time)  Labs Review Labs Reviewed  CBC WITH DIFFERENTIAL/PLATELET - Abnormal; Notable for the following:    Hemoglobin 11.7 (*)    Neutro Abs 8.0 (*)    All other components within normal limits  BASIC METABOLIC PANEL - Abnormal; Notable for the following:    Glucose, Bld 154 (*)    All other components within normal limits  BLOOD GAS, VENOUS - Abnormal; Notable for the following:    pH, Ven 7.438 (*)    Bicarbonate 30.0 (*)    Acid-Base Excess 5.5 (*)    All other components within normal limits  CULTURE, BLOOD (ROUTINE X 2)  CULTURE, BLOOD (ROUTINE X 2)  D-DIMER, QUANTITATIVE (NOT AT [facility])    Imaging Review Dg Ankle Complete Right  10/21/2015  CLINICAL DATA:  Confusion. Multiple falls this week. Pain to the toes. EXAM: RIGHT ANKLE - COMPLETE 3+ VIEW COMPARISON:  None. FINDINGS: There is no evidence of fracture, dislocation, or joint effusion. There is no evidence of arthropathy or other focal bone abnormality. Soft tissues are unremarkable. IMPRESSION: Negative. Electronically Signed   By: Lucienne Capers M.D.   On: 10/21/2015 00:11   Ct Hip Right Wo Contrast  10/21/2015  CLINICAL DATA:  Right hip pain after fall. EXAM: CT OF THE RIGHT HIP WITHOUT CONTRAST TECHNIQUE: Multidetector CT imaging of the right hip was performed according to the standard protocol. Multiplanar CT image reconstructions were also generated. COMPARISON:  Radiographs 10/20/2015 FINDINGS: There is an acute fracture across the base of the femoral neck, continuing across the greater trochanter. The fracture probably spares the lesser trochanter. No dislocation. Pubic rami are intact. Pubic symphysis is intact. IMPRESSION: Acute fracture across the femoral neck and greater trochanter. Electronically Signed   By: Andreas Newport M.D.   On: 10/21/2015 01:23   Dg Chest Port 1 View  10/21/2015  CLINICAL DATA:  Multiple falls this week. Low oxygen saturation.  Confusion. EXAM: PORTABLE CHEST 1 VIEW COMPARISON:  03/25/2015 FINDINGS: Normal heart size and pulmonary vascularity. Infiltration or atelectasis demonstrated in the medial right lower lung. Probable emphysematous changes in the lungs. Scattered calcified granulomas. No blunting of costophrenic angles. No pneumothorax. Calcification of the aorta. IMPRESSION: Infiltration or atelectasis in the right lower lung medially. Changes could represent pneumonia in the appropriate clinical setting. Electronically Signed   By: Lucienne Capers M.D.   On: 10/21/2015 00:07   Dg Knee Complete 4 Views Right  10/21/2015  CLINICAL DATA:  Confusion. Multiple falls this week. Pain down to the toes. EXAM: RIGHT KNEE - COMPLETE 4+ VIEW COMPARISON:  None. FINDINGS: There is no evidence of fracture, dislocation, or joint effusion. There is no evidence of arthropathy or other focal bone abnormality. Soft tissues are unremarkable. Vascular calcifications. IMPRESSION: Negative. Electronically Signed   By: Lucienne Capers M.D.   On: 10/21/2015 00:10   Dg Hip Unilat With Pelvis 2-3 Views Right  10/21/2015  CLINICAL DATA:  Multiple falls.  Low abdominal pain. EXAM: DG HIP (WITH OR WITHOUT PELVIS) 2-3V RIGHT COMPARISON:  None. FINDINGS: Diffuse bone demineralization. No evidence of acute fracture or dislocation in the pelvis or right hip. No focal bone lesion or bone destruction. SI joints and symphysis pubis appear intact. IMPRESSION: No acute fracture or dislocation demonstrated in the pelvis or right hip. Electronically Signed   By: Lucienne Capers M.D.   On: 10/21/2015 00:08   I have personally reviewed and evaluated these images and lab results as part of my medical decision-making.   EKG Interpretation None      MDM   Final diagnoses:  Hypoxia  Closed right hip fracture, initial encounter ([place])  CAP (community acquired pneumonia)    65 year old female presents with right hip pain s/p fall. Patient has a history of  dementia and fell today at home prior to arrival (of note, she is currently receiving hospice care for end stage COPD). Denies hitting her head or LOC. Called to the patient's bedside on arrival due to O2 saturations in the low 80s on 8L. O2 increased to 10L with improvement in O2 sat to mid 90s.  Patient is afebrile. Tachycardic. Hypoxic. Heart regular rhythm. Diffuse wheezing to lung fields bilaterally. Abdomen soft, nontender, nondistended. TTP to lateral aspect of right hip with decreased range of  motion due to pain. TTP to right anterior knee and right lateral ankle. Patient is NVI.  Patient given breathing treatment.  CBC negative for leukocytosis, hemoglobin 11.7. BMP unremarkable. Imaging of right hip remarkable for acute fracture across the right femoral neck and greater trochanter. Chest x-ray reveals infiltration or atelectasis in the right lower lung medially, which could represent pneumonia. Patient started on ceftriaxone and azithromycin.  Ortho consulted. Spoke with Dr. Marlou Sa, who advised ortho will see the patient if the medical team and family decide surgery may be an option (son is present at bedside and is the patient's medical decision maker). Hospitalist consulted for admission. Spoke with Dr. Hal Hope. Patient to be admitted to stepdown for further evaluation and management.  Patient discussed with and seen by Dr. Stark Jock.  BP 114/66 mmHg  Pulse 114  Temp(Src) 98.4 F (36.9 C) (Oral)  SpO2 88%     Marella Chimes, PA-C 10/21/15 [place], MD 10/22/15 [phone]

## 2015-10-20 NOTE — ED Notes (Signed)
2 unsuccessful IV attempts by this nurse.

## 2015-10-20 NOTE — ED Notes (Signed)
X-Ray at bedside.

## 2015-10-20 NOTE — ED Notes (Addendum)
Pt BIB EMS from home; pt is hospice due to COPD; pt usually walks with a wheelchair or walker but often tries to walk due to dementia; pt attempted to walk and had a witnessed fall; no LOC; hospice nurse called and gave narcotic pain medicine at 2030; rotation and shortening present in right leg; pelvic binding has decreased pt's pain; pt wears O2 at home on 3L; pt has DNR.  Upon arrival at ER, pt's O2 sat read 80%; O2 increased to 10L and PA at bedside; O2 increased to 94%.

## 2015-10-21 ENCOUNTER — Inpatient Hospital Stay (HOSPITAL_COMMUNITY)

## 2015-10-21 ENCOUNTER — Inpatient Hospital Stay (HOSPITAL_COMMUNITY): Admitting: Certified Registered"

## 2015-10-21 ENCOUNTER — Emergency Department (HOSPITAL_COMMUNITY)

## 2015-10-21 ENCOUNTER — Encounter (HOSPITAL_COMMUNITY): Payer: Self-pay | Admitting: Internal Medicine

## 2015-10-21 ENCOUNTER — Encounter (HOSPITAL_COMMUNITY)
Admission: EM | Disposition: A | Discharge status: Skilled Nursing Facility | Payer: Self-pay | Source: Home / Self Care | Attending: Internal Medicine

## 2015-10-21 DIAGNOSIS — R296 Repeated falls: Secondary | ICD-10-CM | POA: Diagnosis present

## 2015-10-21 DIAGNOSIS — Z66 Do not resuscitate: Secondary | ICD-10-CM | POA: Diagnosis present

## 2015-10-21 DIAGNOSIS — J189 Pneumonia, unspecified organism: Secondary | ICD-10-CM | POA: Diagnosis present

## 2015-10-21 DIAGNOSIS — F17200 Nicotine dependence, unspecified, uncomplicated: Secondary | ICD-10-CM | POA: Diagnosis present

## 2015-10-21 DIAGNOSIS — E119 Type 2 diabetes mellitus without complications: Secondary | ICD-10-CM | POA: Diagnosis present

## 2015-10-21 DIAGNOSIS — F0390 Unspecified dementia without behavioral disturbance: Secondary | ICD-10-CM | POA: Diagnosis not present

## 2015-10-21 DIAGNOSIS — Z01811 Encounter for preprocedural respiratory examination: Secondary | ICD-10-CM | POA: Diagnosis not present

## 2015-10-21 DIAGNOSIS — D649 Anemia, unspecified: Secondary | ICD-10-CM | POA: Diagnosis present

## 2015-10-21 DIAGNOSIS — Z7189 Other specified counseling: Secondary | ICD-10-CM

## 2015-10-21 DIAGNOSIS — S72141A Displaced intertrochanteric fracture of right femur, initial encounter for closed fracture: Secondary | ICD-10-CM | POA: Diagnosis present

## 2015-10-21 DIAGNOSIS — J440 Chronic obstructive pulmonary disease with acute lower respiratory infection: Secondary | ICD-10-CM | POA: Diagnosis present

## 2015-10-21 DIAGNOSIS — Z825 Family history of asthma and other chronic lower respiratory diseases: Secondary | ICD-10-CM | POA: Diagnosis not present

## 2015-10-21 DIAGNOSIS — W19XXXA Unspecified fall, initial encounter: Secondary | ICD-10-CM | POA: Diagnosis present

## 2015-10-21 DIAGNOSIS — S72001A Fracture of unspecified part of neck of right femur, initial encounter for closed fracture: Secondary | ICD-10-CM | POA: Diagnosis not present

## 2015-10-21 DIAGNOSIS — K5900 Constipation, unspecified: Secondary | ICD-10-CM | POA: Diagnosis present

## 2015-10-21 DIAGNOSIS — Y92009 Unspecified place in unspecified non-institutional (private) residence as the place of occurrence of the external cause: Secondary | ICD-10-CM | POA: Diagnosis not present

## 2015-10-21 DIAGNOSIS — S72009A Fracture of unspecified part of neck of unspecified femur, initial encounter for closed fracture: Secondary | ICD-10-CM | POA: Diagnosis present

## 2015-10-21 DIAGNOSIS — J449 Chronic obstructive pulmonary disease, unspecified: Secondary | ICD-10-CM | POA: Diagnosis present

## 2015-10-21 DIAGNOSIS — J80 Acute respiratory distress syndrome: Secondary | ICD-10-CM | POA: Diagnosis not present

## 2015-10-21 DIAGNOSIS — J438 Other emphysema: Secondary | ICD-10-CM | POA: Diagnosis not present

## 2015-10-21 DIAGNOSIS — M25551 Pain in right hip: Secondary | ICD-10-CM | POA: Diagnosis present

## 2015-10-21 DIAGNOSIS — Z681 Body mass index (BMI) 19 or less, adult: Secondary | ICD-10-CM | POA: Diagnosis not present

## 2015-10-21 DIAGNOSIS — J9601 Acute respiratory failure with hypoxia: Secondary | ICD-10-CM | POA: Insufficient documentation

## 2015-10-21 DIAGNOSIS — Z9981 Dependence on supplemental oxygen: Secondary | ICD-10-CM | POA: Diagnosis not present

## 2015-10-21 DIAGNOSIS — J9621 Acute and chronic respiratory failure with hypoxia: Secondary | ICD-10-CM

## 2015-10-21 DIAGNOSIS — Z833 Family history of diabetes mellitus: Secondary | ICD-10-CM | POA: Diagnosis not present

## 2015-10-21 DIAGNOSIS — E43 Unspecified severe protein-calorie malnutrition: Secondary | ICD-10-CM | POA: Diagnosis present

## 2015-10-21 DIAGNOSIS — M549 Dorsalgia, unspecified: Secondary | ICD-10-CM | POA: Diagnosis present

## 2015-10-21 HISTORY — PX: FEMUR IM NAIL: SHX1597

## 2015-10-21 LAB — BLOOD GAS, VENOUS
ACID-BASE EXCESS: 5.5 mmol/L — AB (ref 0.0–2.0)
Bicarbonate: 30 mEq/L — ABNORMAL HIGH (ref 20.0–24.0)
O2 CONTENT: 10 L/min
O2 SAT: 70.9 %
Patient temperature: 98.4
TCO2: 27.1 mmol/L (ref 0–100)
pCO2, Ven: 45.1 mmHg (ref 45.0–50.0)
pH, Ven: 7.438 — ABNORMAL HIGH (ref 7.250–7.300)
pO2, Ven: 34.6 mmHg (ref 31.0–45.0)

## 2015-10-21 LAB — CBC WITH DIFFERENTIAL/PLATELET
BASOS ABS: 0 10*3/uL (ref 0.0–0.1)
BASOS PCT: 0 %
EOS PCT: 2 %
Eosinophils Absolute: 0.2 10*3/uL (ref 0.0–0.7)
HEMATOCRIT: 36 % (ref 36.0–46.0)
Hemoglobin: 11.7 g/dL — ABNORMAL LOW (ref 12.0–15.0)
Lymphocytes Relative: 7 %
Lymphs Abs: 0.7 10*3/uL (ref 0.7–4.0)
MCH: 29.3 pg (ref 26.0–34.0)
MCHC: 32.5 g/dL (ref 30.0–36.0)
MCV: 90 fL (ref 78.0–100.0)
MONO ABS: 0.8 10*3/uL (ref 0.1–1.0)
Monocytes Relative: 8 %
NEUTROS ABS: 8 10*3/uL — AB (ref 1.7–7.7)
Neutrophils Relative %: 83 %
PLATELETS: 187 10*3/uL (ref 150–400)
RBC: 4 MIL/uL (ref 3.87–5.11)
RDW: 14.5 % (ref 11.5–15.5)
WBC: 9.7 10*3/uL (ref 4.0–10.5)

## 2015-10-21 LAB — TYPE AND SCREEN
ABO/RH(D): A POS
Antibody Screen: NEGATIVE

## 2015-10-21 LAB — PROCALCITONIN

## 2015-10-21 LAB — D-DIMER, QUANTITATIVE (NOT AT ARMC): D DIMER QUANT: 3.73 ug{FEU}/mL — AB (ref 0.00–0.50)

## 2015-10-21 LAB — SURGICAL PCR SCREEN
MRSA, PCR: NEGATIVE
STAPHYLOCOCCUS AUREUS: NEGATIVE

## 2015-10-21 LAB — TROPONIN I

## 2015-10-21 LAB — BASIC METABOLIC PANEL
ANION GAP: 8 (ref 5–15)
BUN: 11 mg/dL (ref 6–20)
CO2: 31 mmol/L (ref 22–32)
Calcium: 9.5 mg/dL (ref 8.9–10.3)
Chloride: 101 mmol/L (ref 101–111)
Creatinine, Ser: 0.86 mg/dL (ref 0.44–1.00)
Glucose, Bld: 154 mg/dL — ABNORMAL HIGH (ref 65–99)
Potassium: 4.2 mmol/L (ref 3.5–5.1)
Sodium: 140 mmol/L (ref 135–145)

## 2015-10-21 LAB — LACTIC ACID, PLASMA: LACTIC ACID, VENOUS: 0.7 mmol/L (ref 0.5–2.0)

## 2015-10-21 LAB — GLUCOSE, CAPILLARY
GLUCOSE-CAPILLARY: 131 mg/dL — AB (ref 65–99)
Glucose-Capillary: 121 mg/dL — ABNORMAL HIGH (ref 65–99)

## 2015-10-21 LAB — ABO/RH: ABO/RH(D): A POS

## 2015-10-21 LAB — ECHOCARDIOGRAM COMPLETE
HEIGHTINCHES: 59 in
WEIGHTICAEL: 1294.54 [oz_av]

## 2015-10-21 LAB — HIV ANTIBODY (ROUTINE TESTING W REFLEX): HIV SCREEN 4TH GENERATION: NONREACTIVE

## 2015-10-21 SURGERY — INSERTION, INTRAMEDULLARY ROD, FEMUR
Anesthesia: Spinal | Laterality: Right

## 2015-10-21 MED ORDER — MEPERIDINE HCL 50 MG/ML IJ SOLN: 6.2500 mg | INTRAMUSCULAR | Status: DC | PRN

## 2015-10-21 MED ORDER — BUDESONIDE 0.25 MG/2ML IN SUSP
0.2500 mg | Freq: Two times a day (BID) | RESPIRATORY_TRACT | Status: DC
  Administered 2015-10-21 – 2015-10-24 (×6): 0.25 mg via RESPIRATORY_TRACT
  Filled 2015-10-21 (×6): qty 2

## 2015-10-21 MED ORDER — POTASSIUM CHLORIDE IN NACL 20-0.9 MEQ/L-% IV SOLN
INTRAVENOUS | Status: AC
  Administered 2015-10-22 – 2015-10-23 (×3): via INTRAVENOUS
  Filled 2015-10-21 (×5): qty 1000

## 2015-10-21 MED ORDER — DESVENLAFAXINE SUCCINATE ER 100 MG PO TB24
100.0000 mg | ORAL_TABLET | Freq: Every day | ORAL | Status: DC
  Administered 2015-10-21 – 2015-10-24 (×4): 100 mg via ORAL
  Filled 2015-10-21 (×4): qty 1

## 2015-10-21 MED ORDER — CEFAZOLIN SODIUM-DEXTROSE 2-4 GM/100ML-% IV SOLN
2.0000 g | Freq: Four times a day (QID) | INTRAVENOUS | Status: DC
  Administered 2015-10-22: 2 g via INTRAVENOUS
  Filled 2015-10-21 (×2): qty 100

## 2015-10-21 MED ORDER — DEXTROSE 5 % IV SOLN
INTRAVENOUS | Status: AC
  Filled 2015-10-21: qty 2

## 2015-10-21 MED ORDER — IPRATROPIUM-ALBUTEROL 0.5-2.5 (3) MG/3ML IN SOLN
3.0000 mL | RESPIRATORY_TRACT | Status: DC
  Administered 2015-10-21 – 2015-10-22 (×2): 3 mL via RESPIRATORY_TRACT
  Filled 2015-10-21 (×2): qty 3

## 2015-10-21 MED ORDER — LACTATED RINGERS IV SOLN: INTRAVENOUS | Status: DC | PRN

## 2015-10-21 MED ORDER — DEXTROSE 5 % IV SOLN
500.0000 mg | Freq: Once | INTRAVENOUS | Status: AC
  Administered 2015-10-21: 500 mg via INTRAVENOUS
  Filled 2015-10-21: qty 500

## 2015-10-21 MED ORDER — PROPOFOL 10 MG/ML IV BOLUS
INTRAVENOUS | Status: AC
  Filled 2015-10-21: qty 20

## 2015-10-21 MED ORDER — HYDROCODONE-ACETAMINOPHEN 5-325 MG PO TABS
1.0000 | ORAL_TABLET | Freq: Four times a day (QID) | ORAL | Status: DC | PRN
  Administered 2015-10-23 – 2015-10-24 (×5): 2 via ORAL
  Filled 2015-10-21 (×5): qty 2

## 2015-10-21 MED ORDER — METOCLOPRAMIDE HCL 5 MG PO TABS
5.0000 mg | ORAL_TABLET | Freq: Three times a day (TID) | ORAL | Status: DC | PRN
  Filled 2015-10-21: qty 2

## 2015-10-21 MED ORDER — TIOTROPIUM BROMIDE MONOHYDRATE 18 MCG IN CAPS
18.0000 ug | ORAL_CAPSULE | Freq: Every day | RESPIRATORY_TRACT | Status: DC
  Filled 2015-10-21: qty 5

## 2015-10-21 MED ORDER — KETAMINE HCL 10 MG/ML IJ SOLN
INTRAMUSCULAR | Status: AC
  Filled 2015-10-21: qty 1

## 2015-10-21 MED ORDER — THEOPHYLLINE ER 300 MG PO TB12
300.0000 mg | ORAL_TABLET | Freq: Two times a day (BID) | ORAL | Status: DC
  Filled 2015-10-21 (×2): qty 1

## 2015-10-21 MED ORDER — ALBUTEROL SULFATE (2.5 MG/3ML) 0.083% IN NEBU
2.5000 mg | INHALATION_SOLUTION | RESPIRATORY_TRACT | Status: DC | PRN
Start: 2015-10-21 — End: 2015-10-24

## 2015-10-21 MED ORDER — ALBUTEROL SULFATE (2.5 MG/3ML) 0.083% IN NEBU
2.5000 mg | INHALATION_SOLUTION | RESPIRATORY_TRACT | Status: DC
  Administered 2015-10-21: 2.5 mg via RESPIRATORY_TRACT
  Filled 2015-10-21 (×2): qty 3

## 2015-10-21 MED ORDER — ISOPROPYL ALCOHOL 70 % SOLN
Status: AC
  Filled 2015-10-21: qty 480

## 2015-10-21 MED ORDER — QUETIAPINE FUMARATE 100 MG PO TABS
100.0000 mg | ORAL_TABLET | Freq: Four times a day (QID) | ORAL | Status: DC
  Administered 2015-10-21 – 2015-10-24 (×12): 100 mg via ORAL
  Filled 2015-10-21 (×12): qty 1

## 2015-10-21 MED ORDER — ONDANSETRON HCL 4 MG/2ML IJ SOLN
4.0000 mg | Freq: Four times a day (QID) | INTRAMUSCULAR | Status: DC | PRN
  Administered 2015-10-22: 4 mg via INTRAVENOUS
  Filled 2015-10-21: qty 2

## 2015-10-21 MED ORDER — CETYLPYRIDINIUM CHLORIDE 0.05 % MT LIQD
7.0000 mL | Freq: Two times a day (BID) | OROMUCOSAL | Status: DC
  Administered 2015-10-21 – 2015-10-24 (×5): 7 mL via OROMUCOSAL

## 2015-10-21 MED ORDER — ACETAMINOPHEN 325 MG PO TABS: 650.0000 mg | ORAL_TABLET | Freq: Four times a day (QID) | ORAL | Status: DC | PRN

## 2015-10-21 MED ORDER — ACETAMINOPHEN 650 MG RE SUPP: 650.0000 mg | Freq: Four times a day (QID) | RECTAL | Status: DC | PRN

## 2015-10-21 MED ORDER — ASPIRIN EC 325 MG PO TBEC
325.0000 mg | DELAYED_RELEASE_TABLET | Freq: Every day | ORAL | Status: DC
  Administered 2015-10-22: 325 mg via ORAL
  Filled 2015-10-21: qty 1

## 2015-10-21 MED ORDER — SODIUM CHLORIDE 0.9 % IV SOLN
INTRAVENOUS | Status: DC | PRN
  Administered 2015-10-21: 21:00:00 via INTRAVENOUS

## 2015-10-21 MED ORDER — IPRATROPIUM BROMIDE 0.02 % IN SOLN
0.5000 mg | Freq: Once | RESPIRATORY_TRACT | Status: AC
  Administered 2015-10-21: 0.5 mg via RESPIRATORY_TRACT
  Filled 2015-10-21: qty 2.5

## 2015-10-21 MED ORDER — MORPHINE SULFATE (PF) 2 MG/ML IV SOLN
0.5000 mg | INTRAVENOUS | Status: DC | PRN
  Administered 2015-10-22 – 2015-10-23 (×5): 0.5 mg via INTRAVENOUS
  Filled 2015-10-21 (×4): qty 1

## 2015-10-21 MED ORDER — ALPRAZOLAM 1 MG PO TABS
1.0000 mg | ORAL_TABLET | Freq: Four times a day (QID) | ORAL | Status: DC | PRN
  Administered 2015-10-22 – 2015-10-24 (×5): 1 mg via ORAL
  Filled 2015-10-21 (×5): qty 1

## 2015-10-21 MED ORDER — METOCLOPRAMIDE HCL 5 MG/ML IJ SOLN: 5.0000 mg | Freq: Three times a day (TID) | INTRAMUSCULAR | Status: DC | PRN

## 2015-10-21 MED ORDER — PANTOPRAZOLE SODIUM 40 MG PO TBEC
40.0000 mg | DELAYED_RELEASE_TABLET | Freq: Every day | ORAL | Status: DC
  Administered 2015-10-21 – 2015-10-24 (×4): 40 mg via ORAL
  Filled 2015-10-21 (×4): qty 1

## 2015-10-21 MED ORDER — OXYBUTYNIN CHLORIDE 5 MG PO TABS
5.0000 mg | ORAL_TABLET | Freq: Every day | ORAL | Status: DC
  Administered 2015-10-22 – 2015-10-23 (×2): 5 mg via ORAL
  Filled 2015-10-21 (×2): qty 1

## 2015-10-21 MED ORDER — ALBUTEROL SULFATE (2.5 MG/3ML) 0.083% IN NEBU
5.0000 mg | INHALATION_SOLUTION | Freq: Once | RESPIRATORY_TRACT | Status: AC
  Administered 2015-10-21: 5 mg via RESPIRATORY_TRACT
  Filled 2015-10-21: qty 6

## 2015-10-21 MED ORDER — FENTANYL CITRATE (PF) 100 MCG/2ML IJ SOLN
25.0000 ug | INTRAMUSCULAR | Status: DC | PRN
  Administered 2015-10-21 (×2): 25 ug via INTRAVENOUS

## 2015-10-21 MED ORDER — FENTANYL CITRATE (PF) 100 MCG/2ML IJ SOLN
INTRAMUSCULAR | Status: AC
  Administered 2015-10-21: 23:00:00
  Filled 2015-10-21: qty 2

## 2015-10-21 MED ORDER — PHENOL 1.4 % MT LIQD
1.0000 | OROMUCOSAL | Status: DC | PRN
  Filled 2015-10-21: qty 177

## 2015-10-21 MED ORDER — IPRATROPIUM-ALBUTEROL 0.5-2.5 (3) MG/3ML IN SOLN: 3.0000 mL | RESPIRATORY_TRACT | Status: DC

## 2015-10-21 MED ORDER — LORAZEPAM 2 MG/ML IJ SOLN
1.0000 mg | Freq: Once | INTRAMUSCULAR | Status: AC
  Administered 2015-10-21: 1 mg via INTRAVENOUS
  Filled 2015-10-21: qty 1

## 2015-10-21 MED ORDER — DEXTROSE 5 % IV SOLN
1.0000 g | INTRAVENOUS | Status: DC
  Filled 2015-10-21: qty 10

## 2015-10-21 MED ORDER — ALBUTEROL SULFATE (2.5 MG/3ML) 0.083% IN NEBU
2.5000 mg | INHALATION_SOLUTION | Freq: Three times a day (TID) | RESPIRATORY_TRACT | Status: DC
  Administered 2015-10-21: 2.5 mg via RESPIRATORY_TRACT
  Filled 2015-10-21: qty 3

## 2015-10-21 MED ORDER — DEXTROSE 5 % IV SOLN
1.0000 g | INTRAVENOUS | Status: DC
  Administered 2015-10-21 – 2015-10-23 (×3): 1 g via INTRAVENOUS
  Filled 2015-10-21 (×4): qty 10

## 2015-10-21 MED ORDER — BUPIVACAINE HCL (PF) 0.5 % IJ SOLN
INTRAMUSCULAR | Status: DC | PRN
  Administered 2015-10-21: 3 mL via INTRATHECAL

## 2015-10-21 MED ORDER — PROCHLORPERAZINE MALEATE 10 MG PO TABS: 10.0000 mg | ORAL_TABLET | ORAL | Status: DC | PRN

## 2015-10-21 MED ORDER — KETAMINE HCL 10 MG/ML IJ SOLN
INTRAMUSCULAR | Status: DC | PRN
  Administered 2015-10-21: 10 mg via INTRAVENOUS
  Administered 2015-10-21: 50 mg via INTRAVENOUS

## 2015-10-21 MED ORDER — HYDROCODONE-ACETAMINOPHEN 5-325 MG PO TABS: 1.0000 | ORAL_TABLET | Freq: Four times a day (QID) | ORAL | Status: DC | PRN

## 2015-10-21 MED ORDER — METOCLOPRAMIDE HCL 5 MG/ML IJ SOLN: 10.0000 mg | Freq: Once | INTRAMUSCULAR | Status: DC | PRN

## 2015-10-21 MED ORDER — IOPAMIDOL (ISOVUE-370) INJECTION 76%
100.0000 mL | Freq: Once | INTRAVENOUS | Status: AC | PRN
  Administered 2015-10-21: 75 mL via INTRAVENOUS

## 2015-10-21 MED ORDER — DEXTROSE 5 % IV SOLN
1.0000 g | Freq: Once | INTRAVENOUS | Status: AC
  Administered 2015-10-21: 1 g via INTRAVENOUS
  Filled 2015-10-21: qty 10

## 2015-10-21 MED ORDER — MORPHINE SULFATE (PF) 2 MG/ML IV SOLN
0.5000 mg | INTRAVENOUS | Status: DC | PRN
  Administered 2015-10-21: 0.5 mg via INTRAVENOUS
  Filled 2015-10-21 (×2): qty 1

## 2015-10-21 MED ORDER — DEXTROSE 5 % IV SOLN
500.0000 mg | INTRAVENOUS | Status: DC
  Administered 2015-10-22 – 2015-10-23 (×2): 500 mg via INTRAVENOUS
  Filled 2015-10-21 (×2): qty 500

## 2015-10-21 MED ORDER — MOMETASONE FURO-FORMOTEROL FUM 200-5 MCG/ACT IN AERO
2.0000 | INHALATION_SPRAY | Freq: Two times a day (BID) | RESPIRATORY_TRACT | Status: DC
  Filled 2015-10-21: qty 8.8

## 2015-10-21 MED ORDER — MIDAZOLAM HCL 2 MG/2ML IJ SOLN
INTRAMUSCULAR | Status: AC
  Filled 2015-10-21: qty 2

## 2015-10-21 MED ORDER — ONDANSETRON HCL 4 MG PO TABS: 4.0000 mg | ORAL_TABLET | Freq: Four times a day (QID) | ORAL | Status: DC | PRN

## 2015-10-21 MED ORDER — FENTANYL CITRATE (PF) 250 MCG/5ML IJ SOLN
INTRAMUSCULAR | Status: AC
  Filled 2015-10-21: qty 5

## 2015-10-21 MED ORDER — MENTHOL 3 MG MT LOZG
1.0000 | LOZENGE | OROMUCOSAL | Status: DC | PRN
  Filled 2015-10-21: qty 9

## 2015-10-21 SURGICAL SUPPLY — 34 items
BAG SPEC THK2 15X12 ZIP CLS (MISCELLANEOUS) ×1
BAG ZIPLOCK 12X15 (MISCELLANEOUS) ×3 IMPLANT
BANDAGE ACE 6X5 VEL STRL LF (GAUZE/BANDAGES/DRESSINGS) ×3 IMPLANT
BLADE SURG 15 STRL LF DISP TIS (BLADE) ×1 IMPLANT
BLADE SURG 15 STRL SS (BLADE) ×3
BNDG GAUZE ELAST 4 BULKY (GAUZE/BANDAGES/DRESSINGS) ×3 IMPLANT
DRAPE STERI IOBAN 125X83 (DRAPES) ×3 IMPLANT
DRSG AQUACEL AG ADV 3.5X 4 (GAUZE/BANDAGES/DRESSINGS) ×2 IMPLANT
DRSG PAD ABDOMINAL 8X10 ST (GAUZE/BANDAGES/DRESSINGS) ×3 IMPLANT
DRSG TEGADERM 4X4.75 (GAUZE/BANDAGES/DRESSINGS) ×3 IMPLANT
DRSG XEROFORM 1X8 (GAUZE/BANDAGES/DRESSINGS) ×2 IMPLANT
DURAPREP 26ML APPLICATOR (WOUND CARE) ×3 IMPLANT
ELECT REM PT RETURN 9FT ADLT (ELECTROSURGICAL) ×3
ELECTRODE REM PT RTRN 9FT ADLT (ELECTROSURGICAL) ×1 IMPLANT
GAUZE SPONGE 4X4 12PLY STRL (GAUZE/BANDAGES/DRESSINGS) ×3 IMPLANT
GLOVE SURG ORTHO 8.0 STRL STRW (GLOVE) ×3 IMPLANT
GOWN STRL REUS W/TWL LRG LVL3 (GOWN DISPOSABLE) ×3 IMPLANT
GUIDE PIN 3.2X343 (PIN) ×1
GUIDE PIN 3.2X343MM (PIN) ×3
KIT BASIN OR (CUSTOM PROCEDURE TRAY) ×3 IMPLANT
NAIL TRIGEN RT 10MMX36CM-125 (Nail) ×2 IMPLANT
PACK GENERAL/GYN (CUSTOM PROCEDURE TRAY) ×3 IMPLANT
PADDING CAST COTTON 6X4 STRL (CAST SUPPLIES) ×3 IMPLANT
PIN GUIDE 3.2X343MM (PIN) IMPLANT
POSITIONER SURGICAL ARM (MISCELLANEOUS) ×3 IMPLANT
SCREW LAG COMPR KIT 80/75 (Screw) ×2 IMPLANT
SPONGE LAP 4X18 X RAY DECT (DISPOSABLE) ×3 IMPLANT
STAPLER VISISTAT (STAPLE) ×3 IMPLANT
SUT ETHILON 3 0 PS 1 (SUTURE) ×1 IMPLANT
SUT VIC AB 0 CT1 27 (SUTURE) ×6
SUT VIC AB 0 CT1 27XBRD ANTBC (SUTURE) ×2 IMPLANT
SUT VIC AB 2-0 CT1 27 (SUTURE) ×6
SUT VIC AB 2-0 CT1 TAPERPNT 27 (SUTURE) ×2 IMPLANT
TOWEL OR 17X26 10 PK STRL BLUE (TOWEL DISPOSABLE) ×7 IMPLANT

## 2015-10-21 NOTE — Progress Notes (Signed)
Patient seen and examined, vital stable, patient is very frail, cachectic, she is very lethargic, only oriented to self, she has mittens on , review chart she is on seroquel four times a day, she also received narcan last night. She has mydriasis on exam, lung clear, no wheezing currently, tender to palpation to abdomen, labs unremarkable. CT A no PE, does review constipation and left hydronephrosis. Patient has chronic back pain, will get ua, start stool softener, Appreciate pulm input, patient is high risk for surgery. Per family patient has been able to walk piror to this, surgery planned this pm, family aware of risk of surgery. Patient has been on home hospice, may need to go to rehab prior to return to home hospice. i have talked to son [name] over the phone, he is on the way to the  Hospital.

## 2015-10-21 NOTE — Progress Notes (Signed)
xrays reviewed Right hip high intertroch fracture Will plan for surgery tonight if family desires

## 2015-10-21 NOTE — Brief Op Note (Signed)
10/20/2015 - 10/21/2015  10:14 PM  PATIENT:  Traci Thomas  65 y.o. female  PRE-OPERATIVE DIAGNOSIS:  right intertrochanteric fracture  POST-OPERATIVE DIAGNOSIS:  right intertrochanteric fracture  PROCEDURE:  Procedure(s): INTRAMEDULLARY (IM) NAIL FEMORAL  SURGEON:  Surgeon(s): Meredith Pel, MD  ASSISTANT: None  ANESTHESIA:   spinal  EBL: 50 ml    Total I/O In: -  Out: 50 [Blood:50]  BLOOD ADMINISTERED: none  DRAINS: none   LOCAL MEDICATIONS USED:  none  SPECIMEN:  No Specimen  COUNTS:  YES  TOURNIQUET:  * No tourniquets in log *  DICTATION: .Other Dictation: Dictation Number [id number]  PLAN OF CARE: Admit to inpatient   PATIENT DISPOSITION:  PACU - hemodynamically stable

## 2015-10-21 NOTE — H&P (Signed)
History and Physical    Traci Thomas [id number] DOB: [birth year] DOA: 10/20/2015  Referring MD/NP/PA: Ms.Westfall. PCP: [name], NP  Outpatient Specialists: None. Patient coming from: Home.  Chief Complaint: Fall.  History obtained from patient's son.  HPI: Traci Thomas is a 65 y.o. female with medical history significant of with history of dementia and COPD was brought to the ER after patient had a fall at home. Patient's son stated that he heard a sound and then when checked patient was followed with fluoroscopy. Patient was not unconscious. In the ER CT shows a right hip fracture on call orthopedic surgeon Dr. Marlou Sa was consulted by the ER physician. Patient in the ER was found to be hypoxic initially occurring 10 L oxygen. Chest x-ray showing possible infiltrates. On exam patient is not in distress and by the time I examined patient's oxygen requirement has decreased to 5 L. Patient as per the son did not have any chest pain nausea vomiting diarrhea. D-dimer is elevated and CT angiogram of the chest and CT data pending.  ED Course: CT hip shows fracture and on-call orthopedic surgeon has been consulted.  Review of Systems: As per HPI otherwise 10 point review of systems negative.    Past Medical History  Diagnosis Date  . Diabetes mellitus without complication ([place])   . Asthma   . Back pain   . COPD (chronic obstructive pulmonary disease) (HCC)     Home O2 3lpm, theophylline    Past Surgical History  Procedure Laterality Date  . Appendectomy    . Tonsillectomy       reports that she has been smoking.  She does not have any smokeless tobacco history on file. She reports that she drinks alcohol. She reports that she does not use illicit drugs.  Allergies  Allergen Reactions  . Adhesive [Tape] Other (See Comments)    Skin peels off   . Ciprofloxacin     Unknown reaction   . Prednisone Nausea And Vomiting    Family History  Problem Relation Age of Onset  .  COPD Mother   . Diabetes      Prior to Admission medications   Medication Sig Start Date End Date Taking? Authorizing Provider  albuterol (PROVENTIL HFA;VENTOLIN HFA) 108 (90 BASE) MCG/ACT inhaler Inhale 2 puffs into the lungs every 6 (six) hours as needed for wheezing or shortness of breath.   Yes Historical Provider, MD  albuterol (PROVENTIL) (2.5 MG/3ML) 0.083% nebulizer solution Take 3 mLs (2.5 mg total) by nebulization every 6 (six) hours as needed for wheezing or shortness of breath. 03/31/15  Yes Janece Canterbury, MD  ALPRAZolam Duanne Moron) 1 MG tablet Take 1 mg by mouth every 6 (six) hours as needed for anxiety.   Yes Historical Provider, MD  desvenlafaxine (PRISTIQ) 100 MG 24 hr tablet Take 100 mg by mouth daily.   Yes Historical Provider, MD  lansoprazole (PREVACID) 30 MG capsule Take 30 mg by mouth 2 (two) times daily before a meal.   Yes Historical Provider, MD  mometasone-formoterol (DULERA) 200-5 MCG/ACT AERO Inhale 2 puffs into the lungs 2 (two) times daily.   Yes Historical Provider, MD  morphine (ROXANOL) 20 MG/ML concentrated solution Take 0.5 mLs (10 mg total) by mouth every 2 (two) hours as needed for moderate pain, severe pain, anxiety or shortness of breath. 03/31/15  Yes Janece Canterbury, MD  oxybutynin (DITROPAN) 5 MG tablet Take 5 mg by mouth at bedtime.   Yes Historical Provider, MD  prochlorperazine (COMPAZINE) 10 MG tablet Take 10 mg by mouth every 4 (four) hours as needed for nausea or vomiting.   Yes Historical Provider, MD  QUEtiapine (SEROQUEL) 100 MG tablet Take 100 mg by mouth every 6 (six) hours.   Yes Historical Provider, MD  theophylline (THEODUR) 300 MG 12 hr tablet Take 300 mg by mouth 2 (two) times daily.   Yes Historical Provider, MD  tiotropium (SPIRIVA HANDIHALER) 18 MCG inhalation capsule Place 1 capsule (18 mcg total) into inhaler and inhale daily. 03/31/15  Yes Janece Canterbury, MD  Vitamin D, Ergocalciferol, (DRISDOL) 50000 units CAPS capsule Take 50,000  Units by mouth every 7 (seven) days.   Yes Historical Provider, MD  ALPRAZolam Duanne Moron) 0.5 MG tablet Take 1 tablet (0.5 mg total) by mouth 3 (three) times daily. Patient not taking: Reported on 10/21/2015 03/31/15   Janece Canterbury, MD  feeding supplement, GLUCERNA SHAKE, (GLUCERNA SHAKE) LIQD Take 237 mLs by mouth 2 (two) times daily between meals. Patient not taking: Reported on 10/21/2015 03/31/15   Janece Canterbury, MD  prednisoLONE (PRELONE) 15 MG/5ML SOLN Take 67mL daily for 3 days, then 33mL daily for three days, then 52mL daily for 4 days, then stop Patient not taking: Reported on 10/21/2015 03/31/15   Janece Canterbury, MD  QUEtiapine (SEROQUEL) 25 MG tablet Take 1 tablet (25 mg total) by mouth at bedtime. Patient not taking: Reported on 10/21/2015 03/31/15   Janece Canterbury, MD    Physical Exam: Filed Vitals:   10/20/15 2330 10/21/15 0100 10/21/15 0130 10/21/15 0200  BP: 119/96 129/83 110/61 114/66  Pulse:  133 114   Temp:      TempSrc:      SpO2:  92% 88%       Constitutional: Appears normal. Filed Vitals:   10/20/15 2330 10/21/15 0100 10/21/15 0130 10/21/15 0200  BP: 119/96 129/83 110/61 114/66  Pulse:  133 114   Temp:      TempSrc:      SpO2:  92% 88%    Eyes: Anicteric. No pallor. ENMT: No discharge from the ears eyes nose and mouth. Neck: No neck rigidity no mass felt. Respiratory: No rhonchi or crepitations. Cardiovascular: S1-S2 heard. Abdomen: Soft nontender bowel sounds present. No guarding or rigidity. Musculoskeletal: Pain with right hip. Skin: No rash. Neurologic: Alert awake oriented to name moles all extremities. Psychiatric: Patient has dementia.   Labs on Admission: I have personally reviewed following labs and imaging studies  CBC:  Recent Labs Lab 10/20/15 2341  WBC 9.7  NEUTROABS 8.0*  HGB 11.7*  HCT 36.0  MCV 90.0  PLT 123XX123   Basic Metabolic Panel:  Recent Labs Lab 10/20/15 2341  NA 140  K 4.2  CL 101  CO2 31  GLUCOSE 154*  BUN 11    CREATININE 0.86  CALCIUM 9.5   GFR: CrCl cannot be calculated (Unknown ideal weight.). Liver Function Tests: No results for input(s): AST, ALT, ALKPHOS, BILITOT, PROT, ALBUMIN in the last 168 hours. No results for input(s): LIPASE, AMYLASE in the last 168 hours. No results for input(s): AMMONIA in the last 168 hours. Coagulation Profile: No results for input(s): INR, PROTIME in the last 168 hours. Cardiac Enzymes: No results for input(s): CKTOTAL, CKMB, CKMBINDEX, TROPONINI in the last 168 hours. BNP (last 3 results) No results for input(s): PROBNP in the last 8760 hours. HbA1C: No results for input(s): HGBA1C in the last 72 hours. CBG: No results for input(s): GLUCAP in the last 168 hours. Lipid Profile: No results  for input(s): CHOL, HDL, LDLCALC, TRIG, CHOLHDL, LDLDIRECT in the last 72 hours. Thyroid Function Tests: No results for input(s): TSH, T4TOTAL, FREET4, T3FREE, THYROIDAB in the last 72 hours. Anemia Panel: No results for input(s): VITAMINB12, FOLATE, FERRITIN, TIBC, IRON, RETICCTPCT in the last 72 hours. Urine analysis:    Component Value Date/Time   COLORURINE YELLOW 03/26/2015 1146   APPEARANCEUR CLEAR 03/26/2015 1146   LABSPEC 1.022 03/26/2015 1146   PHURINE 6.0 03/26/2015 1146   GLUCOSEU NEGATIVE 03/26/2015 1146   HGBUR NEGATIVE 03/26/2015 1146   BILIRUBINUR NEGATIVE 03/26/2015 1146   KETONESUR NEGATIVE 03/26/2015 1146   PROTEINUR NEGATIVE 03/26/2015 1146   UROBILINOGEN 0.2 03/26/2015 1146   NITRITE NEGATIVE 03/26/2015 1146   LEUKOCYTESUR NEGATIVE 03/26/2015 1146   Sepsis Labs: @LABRCNTIP (procalcitonin:4,lacticidven:4) )No results found for this or any previous visit (from the past 240 hour(s)).   Radiological Exams on Admission: Dg Ankle Complete Right  10/21/2015  CLINICAL DATA:  Confusion. Multiple falls this week. Pain to the toes. EXAM: RIGHT ANKLE - COMPLETE 3+ VIEW COMPARISON:  None. FINDINGS: There is no evidence of fracture, dislocation, or  joint effusion. There is no evidence of arthropathy or other focal bone abnormality. Soft tissues are unremarkable. IMPRESSION: Negative. Electronically Signed   By: Lucienne Capers M.D.   On: 10/21/2015 00:11   Ct Hip Right Wo Contrast  10/21/2015  CLINICAL DATA:  Right hip pain after fall. EXAM: CT OF THE RIGHT HIP WITHOUT CONTRAST TECHNIQUE: Multidetector CT imaging of the right hip was performed according to the standard protocol. Multiplanar CT image reconstructions were also generated. COMPARISON:  Radiographs 10/20/2015 FINDINGS: There is an acute fracture across the base of the femoral neck, continuing across the greater trochanter. The fracture probably spares the lesser trochanter. No dislocation. Pubic rami are intact. Pubic symphysis is intact. IMPRESSION: Acute fracture across the femoral neck and greater trochanter. Electronically Signed   By: Andreas Newport M.D.   On: 10/21/2015 01:23   Dg Chest Port 1 View  10/21/2015  CLINICAL DATA:  Multiple falls this week. Low oxygen saturation. Confusion. EXAM: PORTABLE CHEST 1 VIEW COMPARISON:  03/25/2015 FINDINGS: Normal heart size and pulmonary vascularity. Infiltration or atelectasis demonstrated in the medial right lower lung. Probable emphysematous changes in the lungs. Scattered calcified granulomas. No blunting of costophrenic angles. No pneumothorax. Calcification of the aorta. IMPRESSION: Infiltration or atelectasis in the right lower lung medially. Changes could represent pneumonia in the appropriate clinical setting. Electronically Signed   By: Lucienne Capers M.D.   On: 10/21/2015 00:07   Dg Knee Complete 4 Views Right  10/21/2015  CLINICAL DATA:  Confusion. Multiple falls this week. Pain down to the toes. EXAM: RIGHT KNEE - COMPLETE 4+ VIEW COMPARISON:  None. FINDINGS: There is no evidence of fracture, dislocation, or joint effusion. There is no evidence of arthropathy or other focal bone abnormality. Soft tissues are unremarkable.  Vascular calcifications. IMPRESSION: Negative. Electronically Signed   By: Lucienne Capers M.D.   On: 10/21/2015 00:10   Dg Hip Unilat With Pelvis 2-3 Views Right  10/21/2015  CLINICAL DATA:  Multiple falls.  Low abdominal pain. EXAM: DG HIP (WITH OR WITHOUT PELVIS) 2-3V RIGHT COMPARISON:  None. FINDINGS: Diffuse bone demineralization. No evidence of acute fracture or dislocation in the pelvis or right hip. No focal bone lesion or bone destruction. SI joints and symphysis pubis appear intact. IMPRESSION: No acute fracture or dislocation demonstrated in the pelvis or right hip. Electronically Signed   By: Oren Beckmann.D.  On: 10/21/2015 00:08    EKG: Independently reviewed. Sinus tachycardia.  Assessment/Plan Principal Problem:   Hip fracture (HCC) Active Problems:   Acute on chronic respiratory failure with hypoxia (HCC)   CAP (community acquired pneumonia)   COPD (chronic obstructive pulmonary disease) (HCC)   Chronic anemia   Dementia    #1. Right hip fracture - given the patient's advanced COPD and presently requiring more than usual oxygen patient at this time will be at high risk for intermediate risk procedure (hip fracture). I have consulted on call pulmonologist for further recommendations and fever before surgery. Patient will be kept nothing by mouth in anticipation of possible procedure. #2. Acute on chronic respiratory failure with COPD and possible pneumonia - since patient's d-dimer is elevated CT angiogram of the chest has been ordered which is pending. For now we will continue with patient's nebulizer and home inhalers and empiric antibiotics for now for possible pneumonia. Follow CT scan chest results. #3. Chronic anemia - follow CBC. Type and screen. #4. Dementia - no acute issues.   DVT prophylaxis: SCDs in anticipation of procedure. Code Status: DO NOT RESUSCITATE.  Family Communication: Patient's son.  Disposition Plan: Rehabilitation.  Consults called:  Pulmonologist and on-call orthopedic surgeon Dr. Marlou Sa.  Admission status: Inpatient. Likely stage 3-4 days.    Rise Patience MD [facility] Pager [phone].  If 7PM-7AM, please contact night-coverage [email or web address] Password [email or web address]  10/21/2015, 3:28 AM

## 2015-10-21 NOTE — Anesthesia Preprocedure Evaluation (Addendum)
Anesthesia Evaluation  Patient identified by MRN, date of birth, ID band Patient awake    Reviewed: Allergy & Precautions, NPO status , Patient's Chart, lab work & pertinent test results  Airway Mallampati: II  TM Distance: >3 FB Neck ROM: Full    Dental no notable dental hx.    Pulmonary asthma , COPD,  oxygen dependent, Current Smoker,  End stage COPD on hospice   Pulmonary exam normal breath sounds clear to auscultation       Cardiovascular negative cardio ROS Normal cardiovascular exam Rhythm:Regular Rate:Normal     Neuro/Psych Progressive dementia negative neurological ROS  negative psych ROS   GI/Hepatic negative GI ROS, Neg liver ROS,   Endo/Other  negative endocrine ROSdiabetes  Renal/GU negative Renal ROS  negative genitourinary   Musculoskeletal negative musculoskeletal ROS (+)   Abdominal   Peds negative pediatric ROS (+)  Hematology negative hematology ROS (+)   Anesthesia Other Findings   Reproductive/Obstetrics negative OB ROS                            Anesthesia Physical Anesthesia Plan  ASA: IV  Anesthesia Plan: Spinal   Post-op Pain Management:    Induction:   Airway Management Planned: Nasal Cannula and Simple Face Mask  Additional Equipment:   Intra-op Plan:   Post-operative Plan:   Informed Consent: I have reviewed the patients History and Physical, chart, labs and discussed the procedure including the risks, benefits and alternatives for the proposed anesthesia with the patient or authorized representative who has indicated his/her understanding and acceptance.   Dental advisory given  Plan Discussed with: CRNA  Anesthesia Plan Comments: (Discussed sab vs GA with family. I predict she will do poorly with GA and ventilation. Needs spinal.)        Anesthesia Quick Evaluation

## 2015-10-21 NOTE — ED Notes (Signed)
EKG given to EDP,Delo,MD., for review. 

## 2015-10-21 NOTE — ED Notes (Signed)
Second set of blood cultures were not drawn.  RN obtained 1 set of cultures and then started antibiotics.

## 2015-10-21 NOTE — Progress Notes (Signed)
[facility] RN Note  This is a GIP, related, covered admission of 10/21/15 per Dr. Tomasa Hosteller with a [facility] diagnosis of COPD.    Patient was transferred to [facility] ED on 10/20/15 following a fall at home resulting in a right hip fracture.  Patient is scheduled for surgery later today.  She is currently NPO   Visited patient in room.   Respiratory therapy is there administering Neb treatment.  Patient has had pulmonary consult due to hx of COPD and respiratory failure.    No family present at this time.   Patient has her eyes closed and did not open her eyes or speak, but she did respond with head nods only.    She is lethargic but  Is moving her hands and feet.  Appears somewhat restless.   TC was placed to son but unable to reach him.  Message was left.   The patient is on IV antiobiotics; Rocephin and Zithromax.  She did also receive a dose of Narcan in the ED .  She has not received any pain medication .  Spoke with staff RN who did state the patient awoke and has spoken to her.  RN made aware that patient has history of chronic pain and is using morphine at home.    [facility] will continue to follow until discharge. Please call with any questions or concerns.   Thank you!   Mickie Kay, RN, [facility]  [phone]

## 2015-10-21 NOTE — ED Notes (Signed)
Pt in CT.

## 2015-10-21 NOTE — Consult Note (Signed)
PULMONARY / CRITICAL CARE MEDICINE   Name: Traci Thomas MRN: [id number] DOB: 01/23/1951    ADMISSION DATE:  10/20/2015 CONSULTATION DATE:  10/21/2015  REFERRING MD:  Referred by Dr. Margaretha Glassing and Dr. Meredith Pel  CHIEF COMPLAINT:  Acute on chronic hypoxemic respiratory failure, COPD, preoperative pulmonary exam  HISTORY OF PRESENT ILLNESS:   History is gained from the chart. Patient is currently sleeping after the effect of morphine for hip fracture pain.  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 Relatively functional at home and lives at home with son. She is under hospice care for reasons unclear. There was no syncope. Evaluation in the emergency department showed right hip fracture. Orthopedics plans to operate on the patient. Therefore preoperative respiratory consultation has been requested. In addition she was noticed to be requiring anywhere between 5 and 10 L of oxygen deficit angiogram of the chest was done this ruled out pulmonary embolism. I personally visualized the CT chest and compared to 2014-shows some new infiltrates at the bases. Therefore she is on community acquired antibiotics. At this point in time she is down at 3.5 L oxygen which is not far from her baseline. Therefore consult is also for acute hypoxemic respiratory failure.  PAST MEDICAL HISTORY :  She  has a past medical history of Diabetes mellitus without complication ([place]); Asthma; Back pain; and COPD (chronic obstructive pulmonary disease) ([place]).  PAST SURGICAL HISTORY: She  has past surgical history that includes Appendectomy and Tonsillectomy.  Allergies  Allergen Reactions  . Adhesive [Tape] Other (See Comments)    Skin peels off   . Ciprofloxacin     Unknown reaction   . Prednisone Nausea And Vomiting     No current facility-administered medications on file prior to encounter.   Current Outpatient Prescriptions on File Prior to Encounter  Medication Sig  . albuterol (PROVENTIL HFA;VENTOLIN HFA) 108 (90 BASE) MCG/ACT inhaler Inhale 2 puffs into the lungs every 6 (six) hours as needed for wheezing or shortness of breath.  Marland Kitchen albuterol (PROVENTIL) (2.5 MG/3ML) 0.083% nebulizer solution Take 3 mLs (2.5 mg total) by nebulization every 6 (six) hours as needed for wheezing or shortness of breath.  . desvenlafaxine (PRISTIQ) 100 MG 24 hr tablet Take 100 mg by mouth daily.  . lansoprazole (PREVACID) 30 MG capsule Take 30 mg by mouth 2 (two) times daily before a meal.  . mometasone-formoterol (DULERA) 200-5 MCG/ACT AERO Inhale 2 puffs into the lungs 2 (two) times daily.  Marland Kitchen morphine (ROXANOL) 20 MG/ML concentrated solution Take 0.5 mLs (10 mg total) by mouth every 2 (two) hours as needed for moderate pain, severe pain, anxiety or shortness of breath.  . oxybutynin (DITROPAN) 5 MG tablet Take 5 mg by mouth at bedtime.  . theophylline (THEODUR) 300 MG 12 hr tablet Take 300 mg by mouth 2 (two) times daily.  Marland Kitchen tiotropium (SPIRIVA HANDIHALER) 18 MCG inhalation capsule Place 1 capsule (18 mcg total) into inhaler and inhale daily.  Marland Kitchen ALPRAZolam (XANAX) 0.5 MG tablet Take 1 tablet (0.5 mg total) by mouth 3 (three) times daily. (Patient not taking: Reported on 10/21/2015)  . feeding supplement, GLUCERNA SHAKE, (GLUCERNA SHAKE) LIQD Take 237 mLs by mouth 2 (two) times daily between meals. (Patient not taking: Reported on 10/21/2015)  . prednisoLONE (PRELONE) 15 MG/5ML SOLN Take 62mL daily  for 3 days, then 68mL daily for three days, then 44mL daily for 4 days, then stop (Patient not taking: Reported on 10/21/2015)  . QUEtiapine (SEROQUEL) 25 MG tablet Take 1 tablet (25 mg total) by mouth at bedtime. (Patient not taking: Reported on 10/21/2015)    FAMILY HISTORY:  Her has no family status information on file.   SOCIAL  HISTORY: She  reports that she has been smoking.  She does not have any smokeless tobacco history on file. She reports that she drinks alcohol. She reports that she does not use illicit drugs.  REVIEW OF SYSTEMS:   Patient is unable to give history and therefore 11 point review system was not completed in detail. What is in history of present illness is all we got  VITAL SIGNS: BP 112/48 mmHg  Pulse 121  Temp(Src) 99.3 F (37.4 C) (Axillary)  Resp 22  Ht 4\' 11"  (1.499 m)  Wt 36.7 kg (80 lb 14.5 oz)  BMI 16.33 kg/m2  SpO2 92%  HEMODYNAMICS:    VENTILATOR SETTINGS:    INTAKE / OUTPUT:    PHYSICAL EXAMINATION: General:  Extremely cachectic frail female resting comfortably in the bed Neuro:  Currently sleeping after the effect of morphine HEENT:  Oxygen on. No elevated JVD. No neck nodes Cardiovascular:  Regular rate and rhythm. No murmurs Lungs:  Barrel chest. Clear to auscultation bilaterally. No wheeze Abdomen:  Scaphoid nontender no organomegaly Musculoskeletal:  No edema and no cyanosis no clubbing Skin:  Appears intact in the exposed areas.  LABS:  PULMONARY  Recent Labs Lab 10/20/15 2351  HCO3 30.0*  TCO2 27.1  O2SAT 70.9    CBC  Recent Labs Lab 10/20/15 2341  HGB 11.7*  HCT 36.0  WBC 9.7  PLT 187    COAGULATION No results for input(s): INR in the last 168 hours.  CARDIAC  No results for input(s): TROPONINI in the last 168 hours. No results for input(s): PROBNP in the last 168 hours.   CHEMISTRY  Recent Labs Lab 10/20/15 2341  NA 140  K 4.2  CL 101  CO2 31  GLUCOSE 154*  BUN 11  CREATININE 0.86  CALCIUM 9.5   Estimated Creatinine Clearance: 38.8 mL/min (by C-G formula based on Cr of 0.86).   LIVER No results for input(s): AST, ALT, ALKPHOS, BILITOT, PROT, ALBUMIN, INR in the last 168 hours.   INFECTIOUS No results for input(s): LATICACIDVEN, PROCALCITON in the last 168 hours.   ENDOCRINE CBG (last 3)  No results for  input(s): GLUCAP in the last 72 hours.       IMAGING x48h  - image(s) personally visualized  -   highlighted in bold Dg Ankle Complete Right  10/21/2015  CLINICAL DATA:  Confusion. Multiple falls this week. Pain to the toes. EXAM: RIGHT ANKLE - COMPLETE 3+ VIEW COMPARISON:  None. FINDINGS: There is no evidence of fracture, dislocation, or joint effusion. There is no evidence of arthropathy or other focal bone abnormality. Soft tissues are unremarkable. IMPRESSION: Negative. Electronically Signed   By: Lucienne Capers M.D.   On: 10/21/2015 00:11   Ct Head Wo Contrast  10/21/2015  CLINICAL DATA:  Witnessed fall.  No known head injury. EXAM: CT HEAD WITHOUT CONTRAST TECHNIQUE: Contiguous axial images were obtained from the base of the skull through the vertex without intravenous contrast. COMPARISON:  03/25/2015 FINDINGS: Diffuse cerebral atrophy. Mild ventricular dilatation consistent with central atrophy. Low-attenuation changes in the deep white matter consistent with small vessel ischemia. No mass  effect or midline shift. No abnormal extra-axial fluid collections. Gray-white matter junctions are distinct. Basal cisterns are not effaced. No evidence of acute intracranial hemorrhage. No depressed skull fractures. Opacification of mastoid air cells bilaterally. Visualized paranasal sinuses are clear. Vascular calcifications. IMPRESSION: No acute intracranial abnormalities. Chronic atrophy and small vessel ischemic changes. Electronically Signed   By: Lucienne Capers M.D.   On: 10/21/2015 03:47   Ct Angio Chest Pe W/cm &/or Wo Cm  10/21/2015  CLINICAL DATA:  Cough and shortness of breath. Low oxygen saturation. Elevated D-dimer. EXAM: CT ANGIOGRAPHY CHEST WITH CONTRAST TECHNIQUE: Multidetector CT imaging of the chest was performed using the standard protocol during bolus administration of intravenous contrast. Multiplanar CT image reconstructions and MIPs were obtained to evaluate the vascular anatomy.  CONTRAST:  75 mL Isovue 370 COMPARISON:  01/07/2013. FINDINGS: Technically adequate study with good opacification of the central and segmental pulmonary arteries. No focal filling defects demonstrated. No evidence of significant pulmonary embolus. Normal heart size. Normal caliber thoracic aorta. No evidence of aortic dissection. Great vessel origins are patent. No significant lymphadenopathy in the chest. Esophagus is decompressed. Emphysematous changes and scattered fibrosis throughout the lungs. No focal airspace disease or consolidation. Airways appear patent. Mild atelectasis in the lung bases. No pleural effusions. No pneumothorax. Included portions of the upper abdominal organs demonstrate left renal hydronephrosis. Cause is not determined within the field of view of this study. Degenerative changes in the thoracic spine. Compression of the superior endplates of T4 and QA348G and T11 vertebrae. Fractures are not definitely present on two view chest from 01/15/2015. Fractures may be acute. Multiple fractures may be associated with osteoporosis or possibly metastatic disease. Clinical correlation suggested. Review of the MIP images confirms the above findings. IMPRESSION: No evidence of significant pulmonary embolus. Mild atelectasis in the lung bases. Diffuse emphysematous changes in the lungs. Hydronephrosis in the left kidney. Cause is not demonstrated within the field of view. Multiple thoracic compression fractures may be acute. Electronically Signed   By: Lucienne Capers M.D.   On: 10/21/2015 03:59   Ct Hip Right Wo Contrast  10/21/2015  CLINICAL DATA:  Right hip pain after fall. EXAM: CT OF THE RIGHT HIP WITHOUT CONTRAST TECHNIQUE: Multidetector CT imaging of the right hip was performed according to the standard protocol. Multiplanar CT image reconstructions were also generated. COMPARISON:  Radiographs 10/20/2015 FINDINGS: There is an acute fracture across the base of the femoral neck, continuing across  the greater trochanter. The fracture probably spares the lesser trochanter. No dislocation. Pubic rami are intact. Pubic symphysis is intact. IMPRESSION: Acute fracture across the femoral neck and greater trochanter. Electronically Signed   By: Andreas Newport M.D.   On: 10/21/2015 01:23   Dg Chest Port 1 View  10/21/2015  CLINICAL DATA:  Multiple falls this week. Low oxygen saturation. Confusion. EXAM: PORTABLE CHEST 1 VIEW COMPARISON:  03/25/2015 FINDINGS: Normal heart size and pulmonary vascularity. Infiltration or atelectasis demonstrated in the medial right lower lung. Probable emphysematous changes in the lungs. Scattered calcified granulomas. No blunting of costophrenic angles. No pneumothorax. Calcification of the aorta. IMPRESSION: Infiltration or atelectasis in the right lower lung medially. Changes could represent pneumonia in the appropriate clinical setting. Electronically Signed   By: Lucienne Capers M.D.   On: 10/21/2015 00:07   Dg Knee Complete 4 Views Right  10/21/2015  CLINICAL DATA:  Confusion. Multiple falls this week. Pain down to the toes. EXAM: RIGHT KNEE - COMPLETE 4+ VIEW COMPARISON:  None. FINDINGS:  There is no evidence of fracture, dislocation, or joint effusion. There is no evidence of arthropathy or other focal bone abnormality. Soft tissues are unremarkable. Vascular calcifications. IMPRESSION: Negative. Electronically Signed   By: Lucienne Capers M.D.   On: 10/21/2015 00:10   Dg Hip Unilat With Pelvis 2-3 Views Right  10/21/2015  CLINICAL DATA:  Multiple falls.  Low abdominal pain. EXAM: DG HIP (WITH OR WITHOUT PELVIS) 2-3V RIGHT COMPARISON:  None. FINDINGS: Diffuse bone demineralization. No evidence of acute fracture or dislocation in the pelvis or right hip. No focal bone lesion or bone destruction. SI joints and symphysis pubis appear intact. IMPRESSION: No acute fracture or dislocation demonstrated in the pelvis or right hip. Electronically Signed   By: Lucienne Capers  M.D.   On: 10/21/2015 00:08   [place] Postperative Pulmonary Risk Score Comment Score  Type of surgery - abd ao aneurysm (27), thoracic (21), neurosurgery / upper abdominal / vascular (21), neck (11) Surgery of the hip  0   Emergency Surgery - (11) Emergency  11   ALbumin < 3 or poor nutritional state - (9)  appears cachectic   9   BUN > 30 -  (8) Normal   0   Partial or completely dependent functional status - (7) Partially dependent due to dementia   7   COPD -  (6) COPD present   6   Age - 4 to 29 (4), > 70  (6) Age 25  4  TOTAL  37   Risk Stratifcation scores  - < 10, 11-19, 20-27, 28-40, >40  Moderate high risk      [facility] Postperative Pulmonary Risk Score Comment Score  Age - <50 (0), 50-80 (3), >80 (16) Age 45 3  Preoperative pulse ox - >96 (0), 91-95 (8), <90 (24) Less than 90% on room air 24   Respiratory infection in last month - Yes (17) Ongoing treatment for pneumonia 17   Preoperative anemia - < 10gm% - Yes (11) 11.7 g percent 0   Surgical incision - Upper abdominal (15), Thoracic (24) Hip surgery   0   Duration of surgery - <2h (0), 2-3h (16), >3h (23) Less than 2 hours anticipated 0  Emergency Surgery - Yes (8) Emergency surgery 8  TOTAL  52   Risk Stratification - Low (<26), Intermediate (26-44), High (>45)  High risk        ASSESSMENT / PLAN:  PULMONARY A: #Baseline chronic COPD on 3 L oxygen with chronic hypoxemic respiratory failure on theophylline and triple inhaler therapy #Acute - Acute worsening of hypoxemia associated with some worsening of pulmonary infiltrates suspected as community acquired pneumonia. Pulmonary embolism ruled out - Preoperative respiratory exam for right hip fracture surgery - estimated as high risk for pulmonary complications post hip surgery.     P:    oxygen for pulse ox goal greater than 88%.-She appears close to baseline at this point Agree with antibiotics  -we'll check pro calcitonin and lactic acid Check echocardiogram  and troponin to ensure no associated heart failure acute or chronic   discontinue theophylline with anticipated surgery upcoming - start DuoNeb with Pulmicort nebulizers  She is at high risk for pulmonary complications based on multiple preoperative respiratory assessments. This has been discussed as per chart review with your orthopedic surgeon with the family he she will undergo palliative hip fracture repair. Family is in understanding of the high risk of complications.  I would recommend DO NOT RESUSCITATE status be maintained postoperative  She would be an eligible hospice candidate probably from COPD and post admission back to hospice which I think she is at  Pulmonary critical care will sign off. Call us if needed      Dr. Brand Males, M.D., [facility].C.P Pulmonary and Critical Care Medicine Staff Physician [place] [facility] Pager: [phone], If no answer or between  15:00h - 7:00h: call [phone]  10/21/2015 8:59 AM

## 2015-10-21 NOTE — Progress Notes (Signed)
CSW received consult for SNF placement - noted that patient is scheduled for surgery @ 1826 today. CSW will await PT recommendation after surgery.    Raynaldo Opitz, [facility] Clinical Social Worker cell #: [phone]

## 2015-10-21 NOTE — ED Notes (Signed)
Transported back to CT.

## 2015-10-21 NOTE — Consult Note (Signed)
Reason for Consult: Right hip pain Referring Physician: Dr.Xu  Traci Thomas is an 65 y.o. female.  HPI: Traci Thomas is a 65 year old patient who is ambulatory who lives with her son.  She fell last night and sustained a right hip fracture.  CT angiogram negative for pulmonary embolism CT head negative for acute trauma.  The patient is described as having increasing falls recently by the son.  She does however get around the house.  She does use a walker.  She denies any other orthopedic complaints but is not a particularly reliable historian  Past Medical History  Diagnosis Date  . Diabetes mellitus without complication ([place])   . Asthma   . Back pain   . COPD (chronic obstructive pulmonary disease) ([facility])     Home O2 3lpm, theophylline    Past Surgical History  Procedure Laterality Date  . Appendectomy    . Tonsillectomy      Family History  Problem Relation Age of Onset  . COPD Mother   . Diabetes      Social History:  reports that she has been smoking.  She does not have any smokeless tobacco history on file. She reports that she drinks alcohol. She reports that she does not use illicit drugs.  Allergies:  Allergies  Allergen Reactions  . Adhesive [Tape] Other (See Comments)    Skin peels off   . Ciprofloxacin     Unknown reaction   . Prednisone Nausea And Vomiting    Medications: I have reviewed the patient's current medications.  Results for orders placed or performed during the hospital encounter of 10/20/15 (from the past 48 hour(s))  CBC with Differential     Status: Abnormal   Collection Time: 10/20/15 11:41 PM  Result Value Ref Range   WBC 9.7 4.0 - 10.5 K/uL   RBC 4.00 3.87 - 5.11 MIL/uL   Hemoglobin 11.7 (L) 12.0 - 15.0 g/dL   HCT 36.0 36.0 - 46.0 %   MCV 90.0 78.0 - 100.0 fL   MCH 29.3 26.0 - 34.0 pg   MCHC 32.5 30.0 - 36.0 g/dL   RDW 14.5 11.5 - 15.5 %   Platelets 187 150 - 400 K/uL   Neutrophils Relative % 83 %   Neutro Abs 8.0 (H) 1.7 - 7.7 K/uL    Lymphocytes Relative 7 %   Lymphs Abs 0.7 0.7 - 4.0 K/uL   Monocytes Relative 8 %   Monocytes Absolute 0.8 0.1 - 1.0 K/uL   Eosinophils Relative 2 %   Eosinophils Absolute 0.2 0.0 - 0.7 K/uL   Basophils Relative 0 %   Basophils Absolute 0.0 0.0 - 0.1 K/uL  Basic metabolic panel     Status: Abnormal   Collection Time: 10/20/15 11:41 PM  Result Value Ref Range   Sodium 140 135 - 145 mmol/L   Potassium 4.2 3.5 - 5.1 mmol/L   Chloride 101 101 - 111 mmol/L   CO2 31 22 - 32 mmol/L   Glucose, Bld 154 (H) 65 - 99 mg/dL   BUN 11 6 - 20 mg/dL   Creatinine, Ser 0.86 0.44 - 1.00 mg/dL   Calcium 9.5 8.9 - 10.3 mg/dL   GFR calc non Af Amer >60 >60 mL/min   GFR calc Af Amer >60 >60 mL/min    Comment: (NOTE) The eGFR has been calculated using the CKD EPI equation. This calculation has not been validated in all clinical situations. eGFR's persistently <60 mL/min signify possible Chronic Kidney Disease.  Anion gap 8 5 - 15  Blood gas, venous     Status: Abnormal   Collection Time: 10/20/15 11:51 PM  Result Value Ref Range   O2 Content 10.0 L/min   Delivery systems NASAL CANNULA    pH, Ven 7.438 (H) 7.250 - 7.300   pCO2, Ven 45.1 45.0 - 50.0 mmHg   pO2, Ven 34.6 31.0 - 45.0 mmHg   Bicarbonate 30.0 (H) 20.0 - 24.0 mEq/L   TCO2 27.1 0 - 100 mmol/L   Acid-Base Excess 5.5 (H) 0.0 - 2.0 mmol/L   O2 Saturation 70.9 %   Patient temperature 98.4    Collection site VEIN    Drawn by COLLECTED BY LABORATORY    Sample type VENOUS   D-dimer, quantitative (not at [facility])     Status: Abnormal   Collection Time: 10/21/15  2:18 AM  Result Value Ref Range   D-Dimer, Quant 3.73 (H) 0.00 - 0.50 ug/mL-FEU    Comment: (NOTE) At the manufacturer cut-off of 0.50 ug/mL FEU, this assay has been documented to exclude PE with a sensitivity and negative predictive value of 97 to 99%.  At this time, this assay has not been approved by the FDA to exclude DVT/VTE. Results should be correlated with clinical  presentation.   Type and screen [place]     Status: None   Collection Time: 10/21/15  5:27 AM  Result Value Ref Range   ABO/RH(D) A POS    Antibody Screen NEG    Sample Expiration 10/24/2015     Dg Ankle Complete Right  10/21/2015  CLINICAL DATA:  Confusion. Multiple falls this week. Pain to the toes. EXAM: RIGHT ANKLE - COMPLETE 3+ VIEW COMPARISON:  None. FINDINGS: There is no evidence of fracture, dislocation, or joint effusion. There is no evidence of arthropathy or other focal bone abnormality. Soft tissues are unremarkable. IMPRESSION: Negative. Electronically Signed   By: Lucienne Capers M.D.   On: 10/21/2015 00:11   Ct Head Wo Contrast  10/21/2015  CLINICAL DATA:  Witnessed fall.  No known head injury. EXAM: CT HEAD WITHOUT CONTRAST TECHNIQUE: Contiguous axial images were obtained from the base of the skull through the vertex without intravenous contrast. COMPARISON:  03/25/2015 FINDINGS: Diffuse cerebral atrophy. Mild ventricular dilatation consistent with central atrophy. Low-attenuation changes in the deep white matter consistent with small vessel ischemia. No mass effect or midline shift. No abnormal extra-axial fluid collections. Gray-white matter junctions are distinct. Basal cisterns are not effaced. No evidence of acute intracranial hemorrhage. No depressed skull fractures. Opacification of mastoid air cells bilaterally. Visualized paranasal sinuses are clear. Vascular calcifications. IMPRESSION: No acute intracranial abnormalities. Chronic atrophy and small vessel ischemic changes. Electronically Signed   By: Lucienne Capers M.D.   On: 10/21/2015 03:47   Ct Angio Chest Pe W/cm &/or Wo Cm  10/21/2015  CLINICAL DATA:  Cough and shortness of breath. Low oxygen saturation. Elevated D-dimer. EXAM: CT ANGIOGRAPHY CHEST WITH CONTRAST TECHNIQUE: Multidetector CT imaging of the chest was performed using the standard protocol during bolus administration of intravenous  contrast. Multiplanar CT image reconstructions and MIPs were obtained to evaluate the vascular anatomy. CONTRAST:  75 mL Isovue 370 COMPARISON:  01/07/2013. FINDINGS: Technically adequate study with good opacification of the central and segmental pulmonary arteries. No focal filling defects demonstrated. No evidence of significant pulmonary embolus. Normal heart size. Normal caliber thoracic aorta. No evidence of aortic dissection. Great vessel origins are patent. No significant lymphadenopathy in the chest. Esophagus is  decompressed. Emphysematous changes and scattered fibrosis throughout the lungs. No focal airspace disease or consolidation. Airways appear patent. Mild atelectasis in the lung bases. No pleural effusions. No pneumothorax. Included portions of the upper abdominal organs demonstrate left renal hydronephrosis. Cause is not determined within the field of view of this study. Degenerative changes in the thoracic spine. Compression of the superior endplates of T4 and P32 and T11 vertebrae. Fractures are not definitely present on two view chest from 01/15/2015. Fractures may be acute. Multiple fractures may be associated with osteoporosis or possibly metastatic disease. Clinical correlation suggested. Review of the MIP images confirms the above findings. IMPRESSION: No evidence of significant pulmonary embolus. Mild atelectasis in the lung bases. Diffuse emphysematous changes in the lungs. Hydronephrosis in the left kidney. Cause is not demonstrated within the field of view. Multiple thoracic compression fractures may be acute. Electronically Signed   By: Lucienne Capers M.D.   On: 10/21/2015 03:59   Ct Hip Right Wo Contrast  10/21/2015  CLINICAL DATA:  Right hip pain after fall. EXAM: CT OF THE RIGHT HIP WITHOUT CONTRAST TECHNIQUE: Multidetector CT imaging of the right hip was performed according to the standard protocol. Multiplanar CT image reconstructions were also generated. COMPARISON:   Radiographs 10/20/2015 FINDINGS: There is an acute fracture across the base of the femoral neck, continuing across the greater trochanter. The fracture probably spares the lesser trochanter. No dislocation. Pubic rami are intact. Pubic symphysis is intact. IMPRESSION: Acute fracture across the femoral neck and greater trochanter. Electronically Signed   By: Andreas Newport M.D.   On: 10/21/2015 01:23   Dg Chest Port 1 View  10/21/2015  CLINICAL DATA:  Multiple falls this week. Low oxygen saturation. Confusion. EXAM: PORTABLE CHEST 1 VIEW COMPARISON:  03/25/2015 FINDINGS: Normal heart size and pulmonary vascularity. Infiltration or atelectasis demonstrated in the medial right lower lung. Probable emphysematous changes in the lungs. Scattered calcified granulomas. No blunting of costophrenic angles. No pneumothorax. Calcification of the aorta. IMPRESSION: Infiltration or atelectasis in the right lower lung medially. Changes could represent pneumonia in the appropriate clinical setting. Electronically Signed   By: Lucienne Capers M.D.   On: 10/21/2015 00:07   Dg Knee Complete 4 Views Right  10/21/2015  CLINICAL DATA:  Confusion. Multiple falls this week. Pain down to the toes. EXAM: RIGHT KNEE - COMPLETE 4+ VIEW COMPARISON:  None. FINDINGS: There is no evidence of fracture, dislocation, or joint effusion. There is no evidence of arthropathy or other focal bone abnormality. Soft tissues are unremarkable. Vascular calcifications. IMPRESSION: Negative. Electronically Signed   By: Lucienne Capers M.D.   On: 10/21/2015 00:10   Dg Hip Unilat With Pelvis 2-3 Views Right  10/21/2015  CLINICAL DATA:  Multiple falls.  Low abdominal pain. EXAM: DG HIP (WITH OR WITHOUT PELVIS) 2-3V RIGHT COMPARISON:  None. FINDINGS: Diffuse bone demineralization. No evidence of acute fracture or dislocation in the pelvis or right hip. No focal bone lesion or bone destruction. SI joints and symphysis pubis appear intact. IMPRESSION: No  acute fracture or dislocation demonstrated in the pelvis or right hip. Electronically Signed   By: Lucienne Capers M.D.   On: 10/21/2015 00:08    Review of Systems  Unable to perform ROS  Blood pressure 112/48, pulse 121, temperature 99.3 F (37.4 C), temperature source Axillary, resp. rate 22, height '4\' 11"'$  (1.499 m), weight 36.7 kg (80 lb 14.5 oz), SpO2 92 %. Physical Exam  Constitutional: She appears well-developed.  HENT:  Head: Normocephalic.  Eyes: Pupils are equal, round, and reactive to light.  Neck: Normal range of motion.  Cardiovascular: Normal rate.   Respiratory: Effort normal.  Skin: Skin is warm.  Psychiatric: She has a normal mood and affect.   examination of the upper chemise demonstrates that minutes are on her hands she has pretty reasonable bilateral wrist elbow shoulder passive range of motion without bruising crepitus or pain.  Neck is nontender.  Left lower extremity demonstrates good range of motion of the ankle knee and hip without crepitus pain or swelling.  She does have pain with range of motion of the right hip which is understandable there is no right knee effusion ankle dorsi flexion and plantar flexion is intact on the right left-hand side a feet are perfused and sensate both hands are perfused and sensate  Assessment/Plan: Impression is right intertrochanteric hip fracture which is high in a 65 year old patient with dementia.  She has multiple other medical comorbidities including diabetes COPD.  I discussed Traci Thomas with her son this morning at length.  Discussed the nature of the sentinel event in her life in terms of one-year mortality.  Nonetheless the patient was fairly functional prior to the fall landing is for right hip fracture fixation with intramedullary nail risks and benefits discussed all questions answered plan for surgery tonight nothing by mouth no blood thinners all questions answered  Lia Vigilante SCOTT 10/21/2015, 8:11 AM

## 2015-10-21 NOTE — Anesthesia Postprocedure Evaluation (Signed)
Anesthesia Post Note  Patient: DAYSIA ERRICKSON  Procedure(s) Performed: Procedure(s) (LRB): INTRAMEDULLARY (IM) NAIL FEMORAL (Right)  Patient location during evaluation: PACU Anesthesia Type: Spinal Level of consciousness: awake and alert and confused Pain management: pain level controlled Vital Signs Assessment: post-procedure vital signs reviewed and stable Respiratory status: spontaneous breathing, nonlabored ventilation, respiratory function stable and patient connected to nasal cannula oxygen Cardiovascular status: blood pressure returned to baseline and stable Postop Assessment: no signs of nausea or vomiting and spinal receding Anesthetic complications: no    Last Vitals:  Filed Vitals:   10/21/15 2245 10/21/15 2300  BP: 149/105 142/90  Pulse: 94 96  Temp:    Resp: 16 17    Last Pain: There were no vitals filed for this visit.               Montez Hageman

## 2015-10-21 NOTE — Progress Notes (Signed)
Initial Nutrition Assessment  DOCUMENTATION CODES:   Severe malnutrition in context of chronic illness, Underweight  INTERVENTION:   Diet advancement per MD Once diet is advanced, recommend Ensure Enlive po BID, each supplement provides 350 kcal and 20 grams of protein RD to continue to monitor  NUTRITION DIAGNOSIS:   Malnutrition related to chronic illness as evidenced by severe depletion of body fat, severe depletion of muscle mass.  GOAL:   Patient will meet greater than or equal to 90% of their needs  MONITOR:   Diet advancement, Labs, Weight trends, I & O's  REASON FOR ASSESSMENT:   Consult Hip fracture protocol  ASSESSMENT:   65 year old female with a past medical history of diabetes not otherwise specified COPD on theophylline with chronic hypoxemic respiratory failure on 3 L home oxygen dementia not otherwise specified but apparently poor historian. Relatively functional at home and lives at home with son. She is under hospice care for reasons unclear. There was no syncope. Evaluation in the emergency department showed right hip fracture. Orthopedics plans to operate on the patient.  Pt in room with nurse tech at bedside. No family present. Pt unable to provide history d/t lethargy and dementia. Pt with mitts on hands. With the assistance of the tech, attempted to gather information but was unsuccessful. She was able to tell me she would like chocolate Ensure, RD to order once diet is advanced. Currently NPO for pending hip surgery this evening. Per weight history, pt has lost 8 lb since October 2016 (9% wt loss x 7 months, insignificant for time frame). Nutrition-Focused physical exam completed. Findings are severe fat depletion, severe muscle depletion, and no edema.   Labs reviewed. Medications reviewed.  Diet Order:  Diet NPO time specified  Skin:  Reviewed, no issues  Last BM:  PTA  Height:   Ht Readings from Last 1 Encounters:  10/21/15 4\' 11"  (1.499 m)     Weight:   Wt Readings from Last 1 Encounters:  10/21/15 80 lb 14.5 oz (36.7 kg)    Ideal Body Weight:  44.5 kg  BMI:  Body mass index is 16.33 kg/(m^2).  Estimated Nutritional Needs:   Kcal:  1350-1550  Protein:  60-70g  Fluid:  1.6L/day  EDUCATION NEEDS:   No education needs identified at this time  Clayton Bibles, MS, RD, LDN Pager: [phone] After Hours Pager: [phone]

## 2015-10-21 NOTE — Progress Notes (Signed)
Echocardiogram 2D Echocardiogram has been performed.  Tresa Res 10/21/2015, 3:35 PM

## 2015-10-21 NOTE — Anesthesia Procedure Notes (Signed)
Spinal Patient location during procedure: OR Staffing Anesthesiologist: Montez Hageman Performed by: anesthesiologist  Preanesthetic Checklist Completed: patient identified, site marked, surgical consent, pre-op evaluation, timeout performed, IV checked, risks and benefits discussed and monitors and equipment checked Spinal Block Patient position: left lateral decubitus Prep: Betadine Patient monitoring: heart rate, continuous pulse ox and blood pressure Approach: right paramedian Location: L2-3 Injection technique: single-shot Needle Needle type: Spinocan  Needle gauge: 22 G Needle length: 9 cm Additional Notes Expiration date of kit checked and confirmed. Patient tolerated procedure well, without complications.

## 2015-10-21 NOTE — Transfer of Care (Signed)
Immediate Anesthesia Transfer of Care Note  Patient: Traci Thomas  Procedure(s) Performed: Procedure(s): INTRAMEDULLARY (IM) NAIL FEMORAL (Right)  Patient Location: PACU  Anesthesia Type:Spinal  Level of Consciousness: awake  Airway & Oxygen Therapy: Patient Spontanous Breathing and Patient connected to face mask oxygen  Post-op Assessment: Report given to RN and Post -op Vital signs reviewed and stable  Post vital signs: Reviewed and stable  Last Vitals:  Filed Vitals:   10/21/15 0441 10/21/15 1524  BP: 112/48 132/84  Pulse: 121 110  Temp: 37.4 C 36.8 C  Resp: 22 20    Last Pain: There were no vitals filed for this visit.       Complications: No apparent anesthesia complications

## 2015-10-22 ENCOUNTER — Encounter (HOSPITAL_COMMUNITY): Payer: Self-pay | Admitting: Orthopedic Surgery

## 2015-10-22 DIAGNOSIS — J438 Other emphysema: Secondary | ICD-10-CM

## 2015-10-22 DIAGNOSIS — J189 Pneumonia, unspecified organism: Secondary | ICD-10-CM

## 2015-10-22 DIAGNOSIS — F0390 Unspecified dementia without behavioral disturbance: Secondary | ICD-10-CM

## 2015-10-22 LAB — COMPREHENSIVE METABOLIC PANEL
ALT: 12 U/L — AB (ref 14–54)
ANION GAP: 12 (ref 5–15)
AST: 14 U/L — ABNORMAL LOW (ref 15–41)
Albumin: 3.6 g/dL (ref 3.5–5.0)
Alkaline Phosphatase: 55 U/L (ref 38–126)
BUN: 10 mg/dL (ref 6–20)
CHLORIDE: 103 mmol/L (ref 101–111)
CO2: 27 mmol/L (ref 22–32)
Calcium: 8.6 mg/dL — ABNORMAL LOW (ref 8.9–10.3)
Creatinine, Ser: 0.83 mg/dL (ref 0.44–1.00)
GFR calc non Af Amer: >60 mL/min (ref >60)
Glucose, Bld: 137 mg/dL — ABNORMAL HIGH (ref 65–99)
POTASSIUM: 3.4 mmol/L — AB (ref 3.5–5.1)
SODIUM: 142 mmol/L (ref 135–145)
Total Bilirubin: 0.7 mg/dL (ref 0.3–1.2)
Total Protein: 6.2 g/dL — ABNORMAL LOW (ref 6.5–8.1)

## 2015-10-22 LAB — CBC
HCT: 32.6 % — ABNORMAL LOW (ref 36.0–46.0)
HEMATOCRIT: 32.5 % — AB (ref 36.0–46.0)
HEMOGLOBIN: 10.7 g/dL — AB (ref 12.0–15.0)
Hemoglobin: 10.7 g/dL — ABNORMAL LOW (ref 12.0–15.0)
MCH: 29.8 pg (ref 26.0–34.0)
MCH: 29.8 pg (ref 26.0–34.0)
MCHC: 32.8 g/dL (ref 30.0–36.0)
MCHC: 32.9 g/dL (ref 30.0–36.0)
MCV: 90.5 fL (ref 78.0–100.0)
MCV: 90.8 fL (ref 78.0–100.0)
PLATELETS: 153 10*3/uL (ref 150–400)
PLATELETS: 154 10*3/uL (ref 150–400)
RBC: 3.59 MIL/uL — AB (ref 3.87–5.11)
RBC: 3.59 MIL/uL — AB (ref 3.87–5.11)
RDW: 15.2 % (ref 11.5–15.5)
RDW: 15.3 % (ref 11.5–15.5)
WBC: 8 10*3/uL (ref 4.0–10.5)
WBC: 8.5 10*3/uL (ref 4.0–10.5)

## 2015-10-22 LAB — GLUCOSE, CAPILLARY
GLUCOSE-CAPILLARY: 113 mg/dL — AB (ref 65–99)
GLUCOSE-CAPILLARY: 126 mg/dL — AB (ref 65–99)
GLUCOSE-CAPILLARY: 129 mg/dL — AB (ref 65–99)
Glucose-Capillary: 119 mg/dL — ABNORMAL HIGH (ref 65–99)

## 2015-10-22 LAB — BASIC METABOLIC PANEL
Anion gap: 10 (ref 5–15)
BUN: 10 mg/dL (ref 6–20)
CHLORIDE: 103 mmol/L (ref 101–111)
CO2: 29 mmol/L (ref 22–32)
Calcium: 8.7 mg/dL — ABNORMAL LOW (ref 8.9–10.3)
Creatinine, Ser: 0.83 mg/dL (ref 0.44–1.00)
GFR calc Af Amer: >60 mL/min (ref >60)
GLUCOSE: 139 mg/dL — AB (ref 65–99)
POTASSIUM: 3.4 mmol/L — AB (ref 3.5–5.1)
Sodium: 142 mmol/L (ref 135–145)

## 2015-10-22 LAB — TSH: TSH: 1.179 u[IU]/mL (ref 0.350–4.500)

## 2015-10-22 LAB — PROTIME-INR
INR: 1.32 (ref 0.00–1.49)
Prothrombin Time: 16 seconds — ABNORMAL HIGH (ref 11.6–15.2)

## 2015-10-22 MED ORDER — IPRATROPIUM-ALBUTEROL 0.5-2.5 (3) MG/3ML IN SOLN
3.0000 mL | Freq: Four times a day (QID) | RESPIRATORY_TRACT | Status: DC
  Administered 2015-10-22 – 2015-10-23 (×4): 3 mL via RESPIRATORY_TRACT
  Filled 2015-10-22 (×5): qty 3

## 2015-10-22 MED ORDER — SENNOSIDES-DOCUSATE SODIUM 8.6-50 MG PO TABS
2.0000 | ORAL_TABLET | Freq: Two times a day (BID) | ORAL | Status: DC
  Administered 2015-10-22 – 2015-10-24 (×4): 2 via ORAL
  Filled 2015-10-22 (×4): qty 2

## 2015-10-22 NOTE — Evaluation (Signed)
Occupational Therapy Evaluation Patient Details Name: Traci Thomas MRN: [id number] DOB: Jan 14, 1951 Today's Date: 10/22/2015    History of Present Illness 65 yo female admitted after sustaining fall/R hip fx. S/P IM nail 10/21/15. Hx of COPD-o2 dep, chronic pain, benzodiazepine dependence, chronic nartcotics, DM.    Clinical Impression   Pt was admitted for the above.  At baseline, sometimes she can complete ADLs at I level and sometimes she needs assistance. She has had increased falls and is here with fx/surgical management.  Pt will benefit from continued OT to increase safety and independence with adls.  Goals in acute are for min to mod A    Follow Up Recommendations  SNF    Equipment Recommendations  3 in 1 bedside comode    Recommendations for Other Services       Precautions / Restrictions Precautions Precautions: Fall Restrictions Weight Bearing Restrictions: Yes RLE Weight Bearing: Weight bearing as tolerated      Mobility Bed Mobility Overal bed mobility: Needs Assistance Bed Mobility: Supine to Sit;Sit to Supine     Supine to sit: Total assist;+2 for physical assistance Sit to supine: Total assist;+2 for physical assistance   General bed mobility comments: assist for trunk and bil LEs. Utilized bedpad.   Transfers Overall transfer level: Needs assistance Equipment used: Rolling walker (2 wheeled) Transfers: Sit to/from Stand Sit to Stand: Mod assist;+2 physical assistance;+2 safety/equipment;From elevated surface         General transfer comment: Assist to rise, stabilize, control descent. Multimocal cues for safety, hand/LE plaement. Stood EOB with RW ~30 seconds for hygiene.     Balance Overall balance assessment: Needs assistance Sitting-balance support: Feet supported;Bilateral upper extremity supported Sitting balance-Leahy Scale: Fair Sitting balance - Comments: min guard for safety   Standing balance support: During functional  activity Standing balance-Leahy Scale: Poor                              ADL Overall ADL's : Needs assistance/impaired                             Toileting- Clothing Manipulation and Hygiene: Total assistance;+2 for safety/equipment;Sit to/from stand         General ADL Comments: pt lethargic at time of evaluation but did respond to questions with nods/head shakes.  She also verbalized that she was cold.  Did not want to get up, but she was incontinent of urine and she needed to be cleaned up as well as sheets changed.  Pt did not engage in any other ADL activities, although they were offered. She initially wanted some water and then changed her mind     Vision     Perception     Praxis      Pertinent Vitals/Pain Pain Assessment: Faces Pain Score: 8  Faces Pain Scale: Hurts whole lot Pain Location: R LE Pain Intervention(s): Limited activity within patient's tolerance;Monitored during session;Premedicated before session;Repositioned     Hand Dominance     Extremity/Trunk Assessment Upper Extremity Assessment Upper Extremity Assessment: Generalized weakness      Cervical / Trunk Assessment Cervical / Trunk Assessment: Kyphotic   Communication Communication Communication: No difficulties   Cognition Arousal/Alertness: Lethargic Behavior During Therapy: Agitated Overall Cognitive Status: Difficult to assess                     General Comments  Exercises       Shoulder Instructions      Home Living Family/patient expects to be discharged to:: Skilled nursing facility Living Arrangements: Children                                      Prior Functioning/Environment Level of Independence: Needs assistance        Comments: sometimes for adls    OT Diagnosis: Acute pain;Generalized weakness   OT Problem List: Decreased strength;Decreased activity tolerance;Impaired balance (sitting and/or  standing);Decreased knowledge of use of DME or AE;Pain (cognition to be further assessed)   OT Treatment/Interventions: Self-care/ADL training;DME and/or AE instruction;Therapeutic activities;Patient/family education;Balance training    OT Goals(Current goals can be found in the care plan section) Acute Rehab OT Goals Patient Stated Goal: none stated. per son, rehab at snf OT Goal Formulation: With family Time For Goal Achievement: 10/29/15 Potential to Achieve Goals: Fair ADL Goals Pt Will Transfer to Toilet: with min assist;stand pivot transfer;bedside commode Pt Will Perform Toileting - Clothing Manipulation and hygiene: with mod assist;sit to/from stand Additional ADL Goal #1: pt will perform bed mobility with min A Additional ADL Goal #2: Pt wil sit EOB x 15 minutes and perform light grooming/UB adl task at supervision level  OT Frequency: Min 2X/week   Barriers to D/C:            Co-evaluation PT/OT/SLP Co-Evaluation/Treatment: Yes Reason for Co-Treatment: For patient/therapist safety   OT goals addressed during session: ADL's and self-care      End of Session    Activity Tolerance: Patient limited by fatigue;Patient limited by pain;Patient limited by lethargy Patient left: in bed;with call bell/phone within reach;with bed alarm set;with family/visitor present   Time: [id number] OT Time Calculation (min): 20 min Charges:  OT General Charges $OT Visit: 1 Procedure OT Evaluation $OT Eval Low Complexity: 1 Procedure G-Codes:    Cordell Guercio 27-Oct-2015, 12:25 PM  Lesle Chris, OTR/L [phone] 10/27/2015

## 2015-10-22 NOTE — Clinical Social Work Note (Signed)
Clinical Social Work Assessment  Patient Details  Name: Traci Thomas MRN: [id number] Date of Birth: 06-13-1951  Date of referral:  10/22/15               Reason for consult:  Facility Placement                Permission sought to share information with:  Chartered certified accountant granted to share information::  Yes, Verbal Permission Granted  Name::        Agency::     Relationship::     Contact Information:     Housing/Transportation Living arrangements for the past 2 months:  Single Family Home Source of Information:  Adult Children (sister, Vaughan Basta) Patient Interpreter Needed:  None Criminal Activity/Legal Involvement Pertinent to Current Situation/Hospitalization:  No - Comment as needed Significant Relationships:  Adult Children, Siblings Lives with:  Adult Children Do you feel safe going back to the place where you live?  No Need for family participation in patient care:  Yes (Comment)  Care giving concerns:  CSW received consult from Dr. Erlinda Hong that patient will need SNF placement. CSW also received call from [place] that she has been trying to find placement from home.    Social Worker assessment / plan:  CSW met with patient's son, [name] & sister Vaughan Basta at bedside to confirm that they are agreeable with plan for SNF. Patient was living with son & daughter-in-law prior to hospitalization.   Employment status:  Disabled (Comment on whether or not currently receiving Disability) Insurance information:  Medicaid In [place], [place] PT Recommendations:  [place] / Referral to community resources:  [place]  Patient/Family's Response to care:  Patient's son is understanding of need for placement & states that he would like to review SNF bed offers with his wife before making a decision. CSW will follow-up with son in the morning.   Patient/Family's Understanding of and Emotional Response to Diagnosis, Current  Treatment, and Prognosis:    Emotional Assessment Appearance:  Appears older than stated age Attitude/Demeanor/Rapport:    Affect (typically observed):    Orientation:  Oriented to Self Alcohol / Substance use:    Psych involvement (Current and /or in the community):     Discharge Needs  Concerns to be addressed:    Readmission within the last 30 days:    Current discharge risk:    Barriers to Discharge:      Standley Brooking, LCSW 10/22/2015, 12:22 PM

## 2015-10-22 NOTE — Evaluation (Signed)
Physical Therapy Evaluation Patient Details Name: SAMEKA MEENACH MRN: [id number] DOB: 10-22-1950 Today's Date: 10/22/2015   History of Present Illness  65 yo female admitted after sustaining fall/R hip fx. S/P IM nail 10/21/15. Hx of COPD-o2 dep, chronic pain, benzodiazepine dependence, chronic nartcotics, DM.   Clinical Impression  On eval, pt required Total assist +2 for bed mobility and Mod assist +2 for static standing at bedside with RW. Pt c/o pain R LE with activity. Recommend SNF for continued rehab.    Follow Up Recommendations SNF    Equipment Recommendations       Recommendations for Other Services       Precautions / Restrictions Precautions Precautions: Fall Restrictions Weight Bearing Restrictions: Yes RLE Weight Bearing: Weight bearing as tolerated      Mobility  Bed Mobility Overal bed mobility: Needs Assistance Bed Mobility: Supine to Sit;Sit to Supine     Supine to sit: Total assist;+2 for physical assistance Sit to supine: Total assist;+2 for physical assistance   General bed mobility comments: assist for trunk and bil LEs. Utilized bedpad.   Transfers Overall transfer level: Needs assistance Equipment used: Rolling walker (2 wheeled) Transfers: Sit to/from Stand Sit to Stand: Mod assist;+2 physical assistance;+2 safety/equipment;From elevated surface         General transfer comment: Assist to rise, stabilize, control descent. Multimocal cues for safety, hand/LE plaement. Stood EOB with RW ~30 seconds for hygiene.   Ambulation/Gait             General Gait Details: NT-pt unable on today  Stairs            Wheelchair Mobility    Modified Rankin (Stroke Patients Only)       Balance Overall balance assessment: Needs assistance         Standing balance support: During functional activity Standing balance-Leahy Scale: Poor                               Pertinent Vitals/Pain Pain Assessment: Faces Faces Pain  Scale: Hurts whole lot Pain Location: R LE with activity Pain Intervention(s): Limited activity within patient's tolerance;Repositioned    Home Living Family/patient expects to be discharged to:: Skilled nursing facility Living Arrangements: Children (son)                    Prior Function Level of Independence: Independent               Hand Dominance        Extremity/Trunk Assessment   Upper Extremity Assessment: Defer to OT evaluation           Lower Extremity Assessment: Generalized weakness      Cervical / Trunk Assessment: Kyphotic  Communication   Communication: No difficulties  Cognition Arousal/Alertness: Lethargic Behavior During Therapy: Agitated (midlly agitated. ) Overall Cognitive Status: Difficult to assess                      General Comments      Exercises        Assessment/Plan    PT Assessment Patient needs continued PT services  PT Diagnosis Difficulty walking;Generalized weakness;Acute pain   PT Problem List Decreased strength;Decreased range of motion;Decreased activity tolerance;Decreased balance;Decreased mobility;Decreased knowledge of use of DME;Pain  PT Treatment Interventions DME instruction;Gait training;Functional mobility training;Therapeutic activities;Patient/family education;Balance training;Therapeutic exercise   PT Goals (Current goals can be found in the Care Plan  section) Acute Rehab PT Goals Patient Stated Goal: none stated. per son, rehab at snf PT Goal Formulation: With family Time For Goal Achievement: 11/05/15 Potential to Achieve Goals: Fair    Frequency Min 3X/week   Barriers to discharge        Co-evaluation               End of Session Equipment Utilized During Treatment: Gait belt Activity Tolerance: Patient limited by pain Patient left: in bed;with call bell/phone within reach;with bed alarm set;with family/visitor present           Time: [id number] PT Time  Calculation (min) (ACUTE ONLY): 19 min   Charges:   PT Evaluation $PT Eval Low Complexity: 1 Procedure     PT G Codes:        Weston Anna, MPT Pager: [phone]

## 2015-10-22 NOTE — Progress Notes (Signed)
[place] RN Note  This is a GIP, related, covered admission of 10/21/15 per Dr. Tomasa Hosteller with a [facility] diagnosis of COPD.  Code status is DNR  Visited patient at bedside. She is one day status post surgical repair of Rt intertrochanteric fracture following a fall at home.   Son is at the bedside.   Patient is finally resting and appears peaceful,comfortable.   Per son she was awake most the night.  Patient is currently receiving IV antibiotics; Rocephin and Zithromax.  Pain being managed with prn Morphine 0.5mg  q2h prn.  She has received 6 doses in the past 24 hours.  She is on 02 at 5L and receiving scheduled albuterol nebs.  Anxiety controlled with Xanax 1mg  q6h prn and has received 3 doses in past 24 hours.   She has rt hip incision with a Silicone dressing.   She has been incontinent of urine.    Discussed with son possible discharge plans.  He has spoken with [facility] SW and is agreeable to her going to SNF for rehab if recommended,  but would like her to continue with Hospice services when rehab is completed.   [facility] will continue to follow until discharge. Please call with questions.   Mickie Kay, [place]  [phone]

## 2015-10-22 NOTE — NC FL2 (Signed)
[place] LEVEL OF CARE SCREENING TOOL     IDENTIFICATION  Patient Name: Traci Thomas Birthdate: 12-08-1950 Sex: female Admission Date (Current Location): 10/20/2015  [place] and [place] Number:  [place] [id number] Facility and Address:  [facility],  [place] [address], [place]      Provider Number: [id number]  Attending Physician Name and Address:  Florencia Reasons, MD  Relative Name and Phone Number:       Current Level of Care: Hospital Recommended Level of Care: [place] Prior Approval Number:    Date Approved/Denied:   PASRR Number: [id number]  Discharge Plan: SNF    Current Diagnoses: Patient Active Problem List   Diagnosis Date Noted  . CAP (community acquired pneumonia) 10/21/2015  . Hip fracture ([place]) 10/21/2015  . Acute respiratory failure with hypoxia ([place]) 10/21/2015  . COPD (chronic obstructive pulmonary disease) ([place]) 10/21/2015  . Chronic anemia 10/21/2015  . Dementia 10/21/2015  . Preoperative respiratory examination 10/21/2015    Class: Acute  . Closed right hip fracture ([place])   . Encounter for palliative care   . COPD exacerbation ([place]) 03/25/2015  . Acute on chronic respiratory failure with hypoxia ([place]) 03/25/2015  . Somnolence 03/25/2015  . Acute delirium 03/25/2015  . Diabetes mellitus type 2 in nonobese ([place]) 03/25/2015  . Depression 03/25/2015    Orientation RESPIRATION BLADDER Height & Weight     Self  O2 (5L) Continent Weight: 80 lb 14.5 oz (36.7 kg) Height:  4\' 11"  (149.9 cm)  BEHAVIORAL SYMPTOMS/MOOD NEUROLOGICAL BOWEL NUTRITION STATUS      Continent Diet (Clear Liquid)  AMBULATORY STATUS COMMUNICATION OF NEEDS Skin   Extensive Assist Verbally Surgical wounds (Incision(Closed)05/03/17HipRight)                       Personal Care Assistance Level of Assistance  Bathing, Dressing Bathing Assistance: Limited assistance   Dressing Assistance: Limited assistance      Functional Limitations Info             SPECIAL CARE FACTORS FREQUENCY  PT (By licensed PT), OT (By licensed OT)     PT Frequency: 5 OT Frequency: 5            Contractures      Additional Factors Info  Code Status, Allergies, Psychotropic Code Status Info: DNR Allergies Info: Adhesive, Ciprofloxacin, Prednisone           Current Medications (10/22/2015):  This is the current hospital active medication list Current Facility-Administered Medications  Medication Dose Route Frequency Provider Last Rate Last Dose  . 0.9 % NaCl with KCl 20 mEq/ L  infusion   Intravenous Continuous Meredith Pel, MD 75 mL/hr at 10/22/15 0025    . acetaminophen (TYLENOL) tablet 650 mg  650 mg Oral Q6H PRN Meredith Pel, MD       Or  . acetaminophen (TYLENOL) suppository 650 mg  650 mg Rectal Q6H PRN Meredith Pel, MD      . albuterol (PROVENTIL) (2.5 MG/3ML) 0.083% nebulizer solution 2.5 mg  2.5 mg Nebulization Q2H PRN Rise Patience, MD      . ALPRAZolam Duanne Moron) tablet 1 mg  1 mg Oral Q6H PRN Rise Patience, MD   1 mg at 10/22/15 0016  . antiseptic oral rinse (CPC / CETYLPYRIDINIUM CHLORIDE 0.05%) solution 7 mL  7 mL Mouth Rinse BID Rise Patience, MD   7 mL at 10/21/15 1059  . aspirin EC  tablet 325 mg  325 mg Oral Q breakfast Meredith Pel, MD   325 mg at 10/22/15 0834  . azithromycin (ZITHROMAX) 500 mg in dextrose 5 % 250 mL IVPB  500 mg Intravenous Q24H Rise Patience, MD      . budesonide (PULMICORT) nebulizer solution 0.25 mg  0.25 mg Nebulization BID Brand Males, MD   0.25 mg at 10/22/15 0858  . cefTRIAXone (ROCEPHIN) 1 g in dextrose 5 % 50 mL IVPB  1 g Intravenous Q24H Rise Patience, MD   1 g at 10/21/15 2106  . desvenlafaxine (PRISTIQ) 24 hr tablet 100 mg  100 mg Oral Daily Rise Patience, MD   100 mg at 10/22/15 0947  . HYDROcodone-acetaminophen (NORCO/VICODIN) 5-325 MG per tablet 1-2 tablet  1-2 tablet Oral Q6H PRN Meredith Pel, MD      . ipratropium-albuterol (DUONEB) 0.5-2.5 (3) MG/3ML nebulizer solution 3 mL  3 mL Nebulization QID Florencia Reasons, MD   3 mL at 10/22/15 0858  . menthol-cetylpyridinium (CEPACOL) lozenge 3 mg  1 lozenge Oral PRN Meredith Pel, MD       Or  . phenol (CHLORASEPTIC) mouth spray 1 spray  1 spray Mouth/Throat PRN Meredith Pel, MD      . metoCLOPramide (REGLAN) tablet 5-10 mg  5-10 mg Oral Q8H PRN Meredith Pel, MD       Or  . metoCLOPramide (REGLAN) injection 5-10 mg  5-10 mg Intravenous Q8H PRN Meredith Pel, MD      . morphine 2 MG/ML injection 0.5 mg  0.5 mg Intravenous Q2H PRN Meredith Pel, MD   0.5 mg at 10/22/15 0947  . ondansetron (ZOFRAN) tablet 4 mg  4 mg Oral Q6H PRN Meredith Pel, MD       Or  . ondansetron [facility]) injection 4 mg  4 mg Intravenous Q6H PRN Meredith Pel, MD      . oxybutynin (DITROPAN) tablet 5 mg  5 mg Oral QHS Rise Patience, MD      . pantoprazole (PROTONIX) EC tablet 40 mg  40 mg Oral Daily Rise Patience, MD   40 mg at 10/22/15 0834  . prochlorperazine (COMPAZINE) tablet 10 mg  10 mg Oral Q4H PRN Rise Patience, MD      . QUEtiapine (SEROQUEL) tablet 100 mg  100 mg Oral Q6H Rise Patience, MD   100 mg at 10/22/15 0546     Discharge Medications: Please see discharge summary for a list of discharge medications.  Relevant Imaging Results:  Relevant Lab Results:   Additional Information SSN: [id number]  Standley Brooking, LCSW

## 2015-10-22 NOTE — Progress Notes (Signed)
Subjective: Pt and son resting   Objective: Vital signs in last 24 hours: Temp:  [97.8 F (36.6 C)-99.1 F (37.3 C)] 97.8 F (36.6 C) (05/04 0559) Pulse Rate:  [94-114] 106 (05/04 0559) Resp:  [11-22] 20 (05/04 0559) BP: (132-157)/(82-105) 156/88 mmHg (05/04 0559) SpO2:  [88 %-100 %] 88 % (05/04 0559)  Intake/Output from previous day: 05/03 0701 - 05/04 0700 In: 1493.8 [I.V.:1393.8; IV Piggyback:100] Out: 50 [Blood:50] Intake/Output this shift:    Exam:  No cellulitis present Compartment soft  Labs:  Recent Labs  10/20/15 2341 10/22/15 0457  HGB 11.7* 10.7*  10.7*    Recent Labs  10/20/15 2341 10/22/15 0457  WBC 9.7 8.5  8.0  RBC 4.00 3.59*  3.59*  HCT 36.0 32.5*  32.6*  PLT 187 154  153    Recent Labs  10/20/15 2341 10/22/15 0457  NA 140 142  142  K 4.2 3.4*  3.4*  CL 101 103  103  CO2 31 29  27   BUN 11 10  10   CREATININE 0.86 0.83  0.83  GLUCOSE 154* 139*  137*  CALCIUM 9.5 8.7*  8.6*    Recent Labs  10/22/15 0457  INR 1.32    Assessment/Plan: Pt stable - ok for wbat rle tomorrow   Mandee Pluta SCOTT 10/22/2015, 7:59 AM

## 2015-10-22 NOTE — Clinical Social Work Placement (Signed)
   CLINICAL SOCIAL WORK PLACEMENT  NOTE  Date:  10/22/2015  Patient Details  Name: Traci Thomas MRN: [id number] Date of Birth: 1950-12-22  Clinical Social Work is seeking post-discharge placement for this patient at the [place] level of care (*CSW will initial, date and re-position this form in  chart as items are completed):  Yes   Patient/family provided with [facility] Work Department's list of facilities offering this level of care within the geographic area requested by the patient (or if unable, by the patient's family).  Yes   Patient/family informed of their freedom to choose among providers that offer the needed level of care, that participate in Medicare, Medicaid or managed care program needed by the patient, have an available bed and are willing to accept the patient.  Yes   Patient/family informed of [facility]'s ownership interest in [facility] and [facility], as well as of the fact that they are under no obligation to receive care at these facilities.  PASRR submitted to EDS on 10/22/15     PASRR number received on 10/22/15     Existing PASRR number confirmed on       FL2 transmitted to all facilities in geographic area requested by pt/family on 10/22/15     FL2 transmitted to all facilities within larger geographic area on       Patient informed that his/her managed care company has contracts with or will negotiate with certain facilities, including the following:        Yes   Patient/family informed of bed offers received.  Patient chooses bed at       Physician recommends and patient chooses bed at      Patient to be transferred to   on  .  Patient to be transferred to facility by       Patient family notified on   of transfer.  Name of family member notified:        PHYSICIAN       Additional Comment:    _______________________________________________ Standley Brooking, LCSW 10/22/2015, 12:25 PM

## 2015-10-22 NOTE — Progress Notes (Signed)
PROGRESS NOTE  Traci Thomas [id number] DOB: [birth year] DOA: 10/20/2015 PCP: [name], NP  HPI/Recap of past 24 hours:  More awake, very confused, only oriented to self, but no agitation, c/o right hip pain, following commands  Assessment/Plan: Principal Problem:   Hip fracture ([place]) Active Problems:   Acute on chronic respiratory failure with hypoxia ([place])   CAP (community acquired pneumonia)   COPD (chronic obstructive pulmonary disease) ([place])   Chronic anemia   Dementia   Preoperative respiratory examination  #1. Right hip fracture - given the patient's advanced COPD and presently requiring more than usual oxygen patient at this time will be at high risk for intermediate risk procedure (hip fracture). Pulmonologist consulted for further recommendations and fever before surgery. Patient went through surgery pm 5/4, now awaiting for placement.  #2. Acute on chronic respiratory failure with COPD and possible pneumonia - patient's d-dimer is elevated, no PE on  CT angiogram. For now we will continue with patient's nebulizer and home inhalers and empiric antibiotics for now for possible pneumonia. #3. Chronic anemia - follow CBC. Type and screen. #4. Dementia - no acute issues.   DVT prophylaxis: SCDs in anticipation of procedure. Code Status: DO NOT RESUSCITATE.  Family Communication: Patient's son.  Disposition Plan: Rehabilitation.     Consultants:  ortho  Procedures: Right hip intertrochanteric fracture open reduction and internal fixation on 5/4  Antibiotics:  Rocephin/zithro   Objective: BP 154/89 mmHg  Pulse 109  Temp(Src) 97.4 F (36.3 C) (Oral)  Resp 24  Ht 4\' 11"  (1.499 m)  Wt 36.7 kg (80 lb 14.5 oz)  BMI 16.33 kg/m2  SpO2 93%  Intake/Output Summary (Last 24 hours) at 10/22/15 1441 Last data filed at 10/22/15 0700  Gross per 24 hour  Intake 1493.75 ml  Output     50 ml  Net 1443.75 ml   Filed Weights   10/21/15 0441  Weight: 36.7 kg  (80 lb 14.5 oz)    Exam:   General:  Very frail, thin, confused, but more awake  Cardiovascular: RRR  Respiratory: CTABL  Abdomen: Soft/ND/NT, positive BS  Musculoskeletal: right hip post op changes  Neuro: confused, but more awake  Data Reviewed: Basic Metabolic Panel:  Recent Labs Lab 10/20/15 2341 10/22/15 0457  NA 140 142  142  K 4.2 3.4*  3.4*  CL 101 103  103  CO2 31 29  27   GLUCOSE 154* 139*  137*  BUN 11 10  10   CREATININE 0.86 0.83  0.83  CALCIUM 9.5 8.7*  8.6*   Liver Function Tests:  Recent Labs Lab 10/22/15 0457  AST 14*  ALT 12*  ALKPHOS 55  BILITOT 0.7  PROT 6.2*  ALBUMIN 3.6   No results for input(s): LIPASE, AMYLASE in the last 168 hours. No results for input(s): AMMONIA in the last 168 hours. CBC:  Recent Labs Lab 10/20/15 2341 10/22/15 0457  WBC 9.7 8.5  8.0  NEUTROABS 8.0*  --   HGB 11.7* 10.7*  10.7*  HCT 36.0 32.5*  32.6*  MCV 90.0 90.5  90.8  PLT 187 154  153   Cardiac Enzymes:    Recent Labs Lab 10/21/15 0949  TROPONINI <0.03   BNP (last 3 results) No results for input(s): BNP in the last 8760 hours.  ProBNP (last 3 results) No results for input(s): PROBNP in the last 8760 hours.  CBG:  Recent Labs Lab 10/21/15 1200 10/21/15 1655 10/22/15 0018 10/22/15 0739 10/22/15 1203  GLUCAP 131* 121*  129* 119* 113*    Recent Results (from the past 240 hour(s))  Blood culture (routine x 2)     Status: None (Preliminary result)   Collection Time: 10/21/15 12:37 AM  Result Value Ref Range Status   Specimen Description BLOOD BLOOD RIGHT FOREARM  Final   Special Requests BOTTLES DRAWN AEROBIC AND ANAEROBIC 5 CC  Final   Culture   Final    NO GROWTH 1 DAY Performed at [facility]    Report Status PENDING  Incomplete  Blood culture (routine x 2)     Status: None (Preliminary result)   Collection Time: 10/21/15  5:35 AM  Result Value Ref Range Status   Specimen Description BLOOD LEFT ARM  Final    Special Requests IN PEDIATRIC BOTTLE 3CC  Final   Culture   Final    NO GROWTH 1 DAY Performed at [facility]    Report Status PENDING  Incomplete  Surgical pcr screen     Status: None   Collection Time: 10/21/15  3:47 PM  Result Value Ref Range Status   MRSA, PCR NEGATIVE NEGATIVE Final   Staphylococcus aureus NEGATIVE NEGATIVE Final    Comment:        The Xpert SA Assay (FDA approved for NASAL specimens in patients over 46 years of age), is one component of a comprehensive surveillance program.  Test performance has been validated by [facility] for patients greater than or equal to 64 year old. It is not intended to diagnose infection nor to guide or monitor treatment.      Studies: Pelvis Portable  10/21/2015  CLINICAL DATA:  Post ORIF of the right hip EXAM: PORTABLE PELVIS 1-2 VIEWS COMPARISON:  Right hip CT - 10/21/2015 FINDINGS: Post intra medullary rod fixation of the right femur and dynamic screw fixation of the right femoral neck. Alignment appears anatomic. No evidence of hardware failure or loosening, though note, the caudal tip of the femoral stem is not imaged Excreted contrast is seen within the urinary bladder. Subcutaneous emphysema about the operative site. No radiopaque foreign body. No new fracture or dislocation. IMPRESSION: Post ORIF of the right femur and femoral neck without evidence of complication. Electronically Signed   By: Sandi Mariscal M.D.   On: 10/21/2015 23:38   Dg C-arm 1-60 Min-no Report  10/21/2015  CLINICAL DATA: surgery C-ARM 1-60 MINUTES Fluoroscopy was utilized by the requesting physician.  No radiographic interpretation.   Dg Femur, Min 2 Views Right  10/21/2015  CLINICAL DATA:  Post right femoral intra medullary nail EXAM: RIGHT FEMUR 2 VIEWS; DG C-ARM 1-60 MIN-NO REPORT COMPARISON:  Right hip CT - 10/21/2015 FINDINGS: 4 spot intraoperative radiographic images of the right hip and proximal aspect the right femur are provided for  review. Images demonstrate the sequela of intra medullary red fixation of the proximal aspect of the femur and dynamic screw fixation of the right femoral neck. Alignment appears anatomic. No evidence of hardware failure loosening. Adjacent vascular calcifications. No radiopaque foreign body. IMPRESSION: Post ORIF of the right femur and femoral neck without evidence of complication. Electronically Signed   By: Sandi Mariscal M.D.   On: 10/21/2015 22:43    Scheduled Meds: . antiseptic oral rinse  7 mL Mouth Rinse BID  . aspirin EC  325 mg Oral Q breakfast  . azithromycin  500 mg Intravenous Q24H  . budesonide (PULMICORT) nebulizer solution  0.25 mg Nebulization BID  . cefTRIAXone (ROCEPHIN)  IV  1 g Intravenous  Q24H  . desvenlafaxine  100 mg Oral Daily  . ipratropium-albuterol  3 mL Nebulization QID  . oxybutynin  5 mg Oral QHS  . pantoprazole  40 mg Oral Daily  . QUEtiapine  100 mg Oral Q6H    Continuous Infusions: . 0.9 % NaCl with KCl 20 mEq / L 75 mL/hr at 10/22/15 1204     Time spent: 38mins  Diana Davenport MD, PhD  [facility] Pager [phone]. If 7PM-7AM, please contact night-coverage at [email or web address], password [facility] 10/22/2015, 2:41 PM  LOS: 1 day

## 2015-10-22 NOTE — Op Note (Signed)
NAME:  KEVIONNA, POSTHUMUS NO.:  [id number]  MEDICAL RECORD NO.:  [id number]  LOCATION:  [facility]                         FACILITY:  [facility]  PHYSICIAN:  Anderson Malta, M.D.    DATE OF BIRTH:  1950-09-18  DATE OF PROCEDURE: DATE OF DISCHARGE:                              OPERATIVE REPORT   PREOPERATIVE DIAGNOSIS:  Right hip intertrochanteric fracture.  POSTOPERATIVE DIAGNOSIS:  Right hip intertrochanteric fracture.  PROCEDURE:  Right hip intertrochanteric fracture open reduction and internal fixation.  SURGEON:  Anderson Malta, M.D.  ASSISTANT:  None.  ANESTHESIA:  Spinal.  ESTIMATED BLOOD LOSS:  50 mL.  INDICATIONS:  Traci Thomas is a 65 year old patient with right hip fracture, presents for operative management after explanation of risks and benefits.  PROCEDURE DETAILS:  The patient was brought to the operating room, where spinal anesthetic was induced.  Preoperative antibiotics were administered.  Time-out called.  The patient was placed on a Hana bed, right leg in traction and slight internal rotation, and left leg in lithotomy position with peroneal nerve well-padded.  The area prescrubbed with alcohol and Betadine, allowed to air dry, prepped with DuraPrep solution, and draped in sterile manner.  Wall drape was utilized.  Under fluoroscopy, preoperative templating with the correct size nail was obtained, Smith and Nephew 10 x 34 mm nail.  The incision was made 4 fingerbreadths proximal to the trochanter. Guide pin placed in the tip of the trochanter distally.  Correct position confirmed the AP and lateral planes under fluoroscopy.  Opening proximal reaming was performed.  The nail was then placed through a separate incision.  Under fluoroscopic guidance, the lag screw and compression screw were placed in the inferior portion of the femoral neck.  This gave excellent fixation and compression.  The correct position confirmed the AP and lateral planes under  fluoroscopy.  At this time jig removed, both incisions thoroughly irrigated.  Closed using a 0 Vicryl suture, 2-0 Vicryl suture, and 3-0 nylon.  The patient tolerated the procedure well without immediate complications.  Aquasol dressing was applied.     Anderson Malta, M.D.     GSD/MEDQ  D:  10/21/2015  T:  10/21/2015  Job:  [id number]

## 2015-10-23 LAB — CBC
HEMATOCRIT: 28.6 % — AB (ref 36.0–46.0)
HEMOGLOBIN: 9.4 g/dL — AB (ref 12.0–15.0)
MCH: 28.9 pg (ref 26.0–34.0)
MCHC: 32.9 g/dL (ref 30.0–36.0)
MCV: 88 fL (ref 78.0–100.0)
Platelets: 155 10*3/uL (ref 150–400)
RBC: 3.25 MIL/uL — ABNORMAL LOW (ref 3.87–5.11)
RDW: 15.2 % (ref 11.5–15.5)
WBC: 8.3 10*3/uL (ref 4.0–10.5)

## 2015-10-23 LAB — PROCALCITONIN

## 2015-10-23 LAB — URINALYSIS, ROUTINE W REFLEX MICROSCOPIC
Bilirubin Urine: NEGATIVE
GLUCOSE, UA: NEGATIVE mg/dL
Ketones, ur: NEGATIVE mg/dL
Nitrite: NEGATIVE
PROTEIN: NEGATIVE mg/dL
Specific Gravity, Urine: 1.012 (ref 1.005–1.030)
pH: 7 (ref 5.0–8.0)

## 2015-10-23 LAB — PROTIME-INR
INR: 1.35 (ref 0.00–1.49)
Prothrombin Time: 16.3 seconds — ABNORMAL HIGH (ref 11.6–15.2)

## 2015-10-23 LAB — RAPID URINE DRUG SCREEN, HOSP PERFORMED
AMPHETAMINES: NOT DETECTED
BENZODIAZEPINES: POSITIVE — AB
Barbiturates: NOT DETECTED
Cocaine: NOT DETECTED
OPIATES: POSITIVE — AB
Tetrahydrocannabinol: NOT DETECTED

## 2015-10-23 LAB — BASIC METABOLIC PANEL
ANION GAP: 8 (ref 5–15)
BUN: 9 mg/dL (ref 6–20)
CHLORIDE: 102 mmol/L (ref 101–111)
CO2: 28 mmol/L (ref 22–32)
CREATININE: 0.64 mg/dL (ref 0.44–1.00)
Calcium: 8.6 mg/dL — ABNORMAL LOW (ref 8.9–10.3)
GFR calc non Af Amer: >60 mL/min (ref >60)
GLUCOSE: 119 mg/dL — AB (ref 65–99)
Potassium: 4 mmol/L (ref 3.5–5.1)
Sodium: 138 mmol/L (ref 135–145)

## 2015-10-23 LAB — GLUCOSE, CAPILLARY
GLUCOSE-CAPILLARY: 125 mg/dL — AB (ref 65–99)
GLUCOSE-CAPILLARY: 133 mg/dL — AB (ref 65–99)
Glucose-Capillary: 108 mg/dL — ABNORMAL HIGH (ref 65–99)
Glucose-Capillary: 136 mg/dL — ABNORMAL HIGH (ref 65–99)

## 2015-10-23 LAB — HEMOGLOBIN A1C
Hgb A1c MFr Bld: 6.5 % — ABNORMAL HIGH (ref 4.8–5.6)
Mean Plasma Glucose: 140 mg/dL

## 2015-10-23 LAB — URINE MICROSCOPIC-ADD ON

## 2015-10-23 MED ORDER — ENOXAPARIN SODIUM 30 MG/0.3ML ~~LOC~~ SOLN
30.0000 mg | SUBCUTANEOUS | Status: DC
  Administered 2015-10-23 – 2015-10-24 (×2): 30 mg via SUBCUTANEOUS
  Filled 2015-10-23 (×2): qty 0.3

## 2015-10-23 MED ORDER — IPRATROPIUM-ALBUTEROL 0.5-2.5 (3) MG/3ML IN SOLN
3.0000 mL | Freq: Three times a day (TID) | RESPIRATORY_TRACT | Status: DC
  Administered 2015-10-23 – 2015-10-24 (×4): 3 mL via RESPIRATORY_TRACT
  Filled 2015-10-23 (×4): qty 3

## 2015-10-23 MED ORDER — NICOTINE 21 MG/24HR TD PT24
21.0000 mg | MEDICATED_PATCH | Freq: Every day | TRANSDERMAL | Status: DC
  Administered 2015-10-23 – 2015-10-24 (×2): 21 mg via TRANSDERMAL
  Filled 2015-10-23 (×2): qty 1

## 2015-10-23 MED ORDER — HYDROCODONE-ACETAMINOPHEN 5-325 MG PO TABS: 1.0000 | ORAL_TABLET | Freq: Four times a day (QID) | ORAL | Status: DC | PRN

## 2015-10-23 MED ORDER — ENSURE ENLIVE PO LIQD
237.0000 mL | Freq: Two times a day (BID) | ORAL | Status: DC
  Administered 2015-10-23 – 2015-10-24 (×3): 237 mL via ORAL

## 2015-10-23 MED ORDER — ENOXAPARIN SODIUM 30 MG/0.3ML ~~LOC~~ SOLN: 30.0000 mg | SUBCUTANEOUS | Status: DC

## 2015-10-23 NOTE — Clinical Social Work Note (Signed)
Pt known to this homecare LCSW from [place]. Pt has had a very difficult time at home, has been declining, had increased paranoia/suspiciousness, has had dose of Seroquel increased. She has very poor insight and judgment. Son works nights in [employer], dtr-in-law is nanny in [place]. We have been pursuing placement in memory care setting but have had challenges due to her Medicaid only status. Pt now admitted after hip fx and son/hcpoa [name] is choosing to revoke hospice benefit in order to use skilled rehab days. LCSW spoke with [facility] LCSW and with Pt/family. Pt is to go to [facility] tomorrow if she is medically ready. Son signed [facility] revocation documents, is aware that he could possibly use Hospice again after she completes rehab days. Pt lethargic , pleasant with limited understanding. Requested nicotine patch to help as she was unsafe smoking at home when she could with oxygen on or nearby. Will advise oncall LCSW Luan Moore and ask that she follow up about d/c tomorrow.

## 2015-10-23 NOTE — Progress Notes (Signed)
PROGRESS NOTE  Traci Thomas [id number] DOB: 1951-05-27 DOA: 10/20/2015 PCP: [name], NP  HPI/Recap of past 24 hours:  awake, only oriented to self, no agitation, c/o right hip pain, following commands  Assessment/Plan: Principal Problem:   Hip fracture (HCC) Active Problems:   Acute on chronic respiratory failure with hypoxia (HCC)   CAP (community acquired pneumonia)   COPD (chronic obstructive pulmonary disease) ([place])   Chronic anemia   Dementia   Preoperative respiratory examination  #1. Right hip fracture - given the patient's advanced COPD and presently requiring more than usual oxygen patient at this time will be at high risk for intermediate risk procedure (hip fracture). Pulmonologist consulted for further recommendations and fever before surgery. Patient went through surgery pm 5/4, now awaiting for placement.  #2. Acute on chronic respiratory failure with COPD and possible pneumonia - patient's d-dimer is elevated, no PE on  CT angiogram. For now we will continue with patient's nebulizer and home inhalers and empiric antibiotics for now for possible pneumonia. #3. Chronic anemia - follow CBC. Type and screen. #4. Dementia - no acute issues.   DVT prophylaxis: lovenox Code Status: DO NOT RESUSCITATE.  Family Communication: Patient's son.  Disposition Plan: Rehabilitation on 5/6    Consultants:  ortho  Procedures: Right hip intertrochanteric fracture open reduction and internal fixation on 5/4  Antibiotics:  Rocephin/zithro   Objective: BP 139/81 mmHg  Pulse 98  Temp(Src) 98 F (36.7 C) (Oral)  Resp 28  Ht 4\' 11"  (1.499 m)  Wt 36.7 kg (80 lb 14.5 oz)  BMI 16.33 kg/m2  SpO2 96%  Intake/Output Summary (Last 24 hours) at 10/23/15 1851 Last data filed at 10/23/15 0945  Gross per 24 hour  Intake    935 ml  Output    350 ml  Net    585 ml   Filed Weights   10/21/15 0441  Weight: 36.7 kg (80 lb 14.5 oz)    Exam:   General:  Very  frail, thin, confused, but more awake  Cardiovascular: RRR  Respiratory: CTABL  Abdomen: Soft/ND/NT, positive BS  Musculoskeletal: right hip post op changes  Neuro: confused, but more awake  Data Reviewed: Basic Metabolic Panel:  Recent Labs Lab 10/20/15 2341 10/22/15 0457 10/23/15 0518  NA 140 142  142 138  K 4.2 3.4*  3.4* 4.0  CL 101 103  103 102  CO2 31 29  27 28   GLUCOSE 154* 139*  137* 119*  BUN 11 10  10 9   CREATININE 0.86 0.83  0.83 0.64  CALCIUM 9.5 8.7*  8.6* 8.6*   Liver Function Tests:  Recent Labs Lab 10/22/15 0457  AST 14*  ALT 12*  ALKPHOS 55  BILITOT 0.7  PROT 6.2*  ALBUMIN 3.6   No results for input(s): LIPASE, AMYLASE in the last 168 hours. No results for input(s): AMMONIA in the last 168 hours. CBC:  Recent Labs Lab 10/20/15 2341 10/22/15 0457 10/23/15 0518  WBC 9.7 8.5  8.0 8.3  NEUTROABS 8.0*  --   --   HGB 11.7* 10.7*  10.7* 9.4*  HCT 36.0 32.5*  32.6* 28.6*  MCV 90.0 90.5  90.8 88.0  PLT 187 154  153 155   Cardiac Enzymes:    Recent Labs Lab 10/21/15 0949  TROPONINI <0.03   BNP (last 3 results) No results for input(s): BNP in the last 8760 hours.  ProBNP (last 3 results) No results for input(s): PROBNP in the last 8760 hours.  CBG:  Recent Labs Lab 10/22/15 1712 10/23/15 0156 10/23/15 0537 10/23/15 1157 10/23/15 1740  GLUCAP 126* 125* 108* 136* 133*    Recent Results (from the past 240 hour(s))  Blood culture (routine x 2)     Status: None (Preliminary result)   Collection Time: 10/21/15 12:37 AM  Result Value Ref Range Status   Specimen Description BLOOD BLOOD RIGHT FOREARM  Final   Special Requests BOTTLES DRAWN AEROBIC AND ANAEROBIC 5 CC  Final   Culture   Final    NO GROWTH 2 DAYS Performed at [facility]    Report Status PENDING  Incomplete  Blood culture (routine x 2)     Status: None (Preliminary result)   Collection Time: 10/21/15  5:35 AM  Result Value Ref Range Status     Specimen Description BLOOD LEFT ARM  Final   Special Requests IN PEDIATRIC BOTTLE 3CC  Final   Culture   Final    NO GROWTH 2 DAYS Performed at [facility]    Report Status PENDING  Incomplete  Surgical pcr screen     Status: None   Collection Time: 10/21/15  3:47 PM  Result Value Ref Range Status   MRSA, PCR NEGATIVE NEGATIVE Final   Staphylococcus aureus NEGATIVE NEGATIVE Final    Comment:        The Xpert SA Assay (FDA approved for NASAL specimens in patients over 68 years of age), is one component of a comprehensive surveillance program.  Test performance has been validated by [facility] for patients greater than or equal to 35 year old. It is not intended to diagnose infection nor to guide or monitor treatment.      Studies: No results found.  Scheduled Meds: . antiseptic oral rinse  7 mL Mouth Rinse BID  . azithromycin  500 mg Intravenous Q24H  . budesonide (PULMICORT) nebulizer solution  0.25 mg Nebulization BID  . cefTRIAXone (ROCEPHIN)  IV  1 g Intravenous Q24H  . desvenlafaxine  100 mg Oral Daily  . enoxaparin (LOVENOX) injection  30 mg Subcutaneous Q24H  . feeding supplement (ENSURE ENLIVE)  237 mL Oral BID BM  . ipratropium-albuterol  3 mL Nebulization TID  . nicotine  21 mg Transdermal Daily  . oxybutynin  5 mg Oral QHS  . pantoprazole  40 mg Oral Daily  . QUEtiapine  100 mg Oral Q6H  . senna-docusate  2 tablet Oral BID    Continuous Infusions: . 0.9 % NaCl with KCl 20 mEq / L 10 mL/hr at 10/23/15 0919     Time spent: 73mins  Sherlene Rickel MD, PhD  [facility] Pager [phone]. If 7PM-7AM, please contact night-coverage at [email or web address], password [facility] 10/23/2015, 6:51 PM  LOS: 2 days

## 2015-10-23 NOTE — Progress Notes (Signed)
Subjective: Pt stable - pain ok   Objective: Vital signs in last 24 hours: Temp:  [97.4 F (36.3 C)-99 F (37.2 C)] 99 F (37.2 C) (05/05 0527) Pulse Rate:  [100-122] 100 (05/05 0527) Resp:  [22-24] 22 (05/05 0527) BP: (127-154)/(70-89) 139/88 mmHg (05/05 0527) SpO2:  [88 %-98 %] 91 % (05/05 0745)  Intake/Output from previous day: 05/04 0701 - 05/05 0700 In: 1920 [P.O.:420; I.V.:1200; IV Piggyback:300] Out: 350 [Urine:350] Intake/Output this shift:    Exam:  Intact pulses distally  Labs:  Recent Labs  10/20/15 2341 10/22/15 0457 10/23/15 0518  HGB 11.7* 10.7*  10.7* 9.4*    Recent Labs  10/22/15 0457 10/23/15 0518  WBC 8.5  8.0 8.3  RBC 3.59*  3.59* 3.25*  HCT 32.5*  32.6* 28.6*  PLT 154  153 155    Recent Labs  10/22/15 0457 10/23/15 0518  NA 142  142 138  K 3.4*  3.4* 4.0  CL 103  103 102  CO2 29  27 28   BUN 10  10 9   CREATININE 0.83  0.83 0.64  GLUCOSE 139*  137* 119*  CALCIUM 8.7*  8.6* 8.6*    Recent Labs  10/22/15 0457 10/23/15 0518  INR 1.32 1.35    Assessment/Plan: Plan pt  And mobilization - snf when medically ready - change to lovenox and use norco for pain meds   DEAN,GREGORY SCOTT 10/23/2015, 8:19 AM

## 2015-10-23 NOTE — Progress Notes (Signed)
PHARMACY CONSULT: Lovenox for VTE prophylaxis   Wt: 36.7 kg BMI:  16.3 Scr:  0.64 CrCl >30 ml/hr  H/H: 9.4/28.6 Pltc: 155  MD wishes to switch VTE prophylaxis post-op hip fracture repair on 5/3 from aspirin to Lovenox today.  Plan: Due to low body weight, start Lovenox 30 mg SQ q24h.   Since renal function stable, do not anticipate further dose adjustment needs so pharmacy will sign off at this time.  Please re-consult if needed.  Hershal Coria, PharmD, BCPS Pager: [phone] 10/23/2015 8:34 AM

## 2015-10-23 NOTE — Progress Notes (Signed)
[place] RN Note  This is a GIP, related, covered admission of 10/21/15 per Dr. Tomasa Hosteller with a [facility] diagnosis of COPD. Code status is DNR   Visited patient at bedside.  No family present.   She is awake, alert with some noted confusion/disorientation.   Patient reports her pain is controlled. She stated she is eating some and that she knows that she will be going to another facilty for rehab.  Per chart review po intake is poor.    02 is now at 4L per n/c and she continues to receive   antibiotics via PIV.  ( Rocephine and Zithromax).   She has been switched from IV morphine to PO Norco q6h prn for pain and has received two doses today.   She has also received Xanax 1mg  po x 2 for sx of anxiety.  She is now on Lovenox SQ injections as well.     I have discussed discharge plans with her [facility].  Son is aware that he will need to meet with [facility] SW to sign revocation paperwork prior to patient's transfer to SNF.  [facility] SW will coordinate this with son.    Patient is scheduled to transfer to [facility] possibly as early as tomorrow or when medically stable.   [facility] will continue to follow patient until discharge.  Please call with any questions.    Mickie Kay, [facility] Liason [phone]

## 2015-10-23 NOTE — Clinical Social Work Placement (Signed)
CSW received call from patient's son accepting bed at [facility]. CSW confirmed with Santiago Glad at [facility] that they would be able to take patient tomorrow. CSW has completed FL2 & will continue to follow and assist with discharge when ready.    Raynaldo Opitz, [facility] Clinical Social Worker cell #: [phone]     CLINICAL SOCIAL WORK PLACEMENT  NOTE  Date:  10/23/2015  Patient Details  Name: Traci Thomas MRN: [id number] Date of Birth: 1950-12-28  Clinical Social Work is seeking post-discharge placement for this patient at the [place] level of care (*CSW will initial, date and re-position this form in  chart as items are completed):  Yes   Patient/family provided with [place] Work Department's list of facilities offering this level of care within the geographic area requested by the patient (or if unable, by the patient's family).  Yes   Patient/family informed of their freedom to choose among providers that offer the needed level of care, that participate in Medicare, Medicaid or managed care program needed by the patient, have an available bed and are willing to accept the patient.  Yes   Patient/family informed of [facility]'s ownership interest in [facility] and [facility], as well as of the fact that they are under no obligation to receive care at these facilities.  PASRR submitted to EDS on 10/22/15     PASRR number received on 10/22/15     Existing PASRR number confirmed on       FL2 transmitted to all facilities in geographic area requested by pt/family on 10/22/15     FL2 transmitted to all facilities within larger geographic area on       Patient informed that his/her managed care company has contracts with or will negotiate with certain facilities, including the following:        Yes   Patient/family informed of bed offers received.  Patient chooses bed at [facility]      Physician recommends and patient chooses bed at      Patient to be transferred to [facility] on  .  Patient to be transferred to facility by       Patient family notified on   of transfer.  Name of family member notified:        PHYSICIAN       Additional Comment:    _______________________________________________ Standley Brooking, LCSW 10/23/2015, 3:26 PM

## 2015-10-24 ENCOUNTER — Inpatient Hospital Stay (HOSPITAL_COMMUNITY)

## 2015-10-24 ENCOUNTER — Encounter (HOSPITAL_COMMUNITY): Payer: Self-pay | Admitting: Radiology

## 2015-10-24 DIAGNOSIS — D649 Anemia, unspecified: Secondary | ICD-10-CM

## 2015-10-24 LAB — CBC
HEMATOCRIT: 27.1 % — AB (ref 36.0–46.0)
HEMOGLOBIN: 8.9 g/dL — AB (ref 12.0–15.0)
MCH: 29.7 pg (ref 26.0–34.0)
MCHC: 32.8 g/dL (ref 30.0–36.0)
MCV: 90.3 fL (ref 78.0–100.0)
PLATELETS: 183 10*3/uL (ref 150–400)
RBC: 3 MIL/uL — AB (ref 3.87–5.11)
RDW: 15.4 % (ref 11.5–15.5)
WBC: 7 10*3/uL (ref 4.0–10.5)

## 2015-10-24 LAB — PROTIME-INR
INR: 1.28 (ref 0.00–1.49)
Prothrombin Time: 15.6 seconds — ABNORMAL HIGH (ref 11.6–15.2)

## 2015-10-24 LAB — GLUCOSE, CAPILLARY
GLUCOSE-CAPILLARY: 110 mg/dL — AB (ref 65–99)
GLUCOSE-CAPILLARY: 116 mg/dL — AB (ref 65–99)
GLUCOSE-CAPILLARY: 93 mg/dL (ref 65–99)

## 2015-10-24 MED ORDER — MORPHINE SULFATE (CONCENTRATE) 20 MG/ML PO SOLN: 10.0000 mg | ORAL | Status: DC | PRN

## 2015-10-24 MED ORDER — AMOXICILLIN-POT CLAVULANATE 875-125 MG PO TABS: 1.0000 | ORAL_TABLET | Freq: Two times a day (BID) | ORAL | Status: DC

## 2015-10-24 MED ORDER — ALPRAZOLAM 0.5 MG PO TABS: 0.5000 mg | ORAL_TABLET | Freq: Two times a day (BID) | ORAL | Status: DC | PRN

## 2015-10-24 MED ORDER — IOPAMIDOL (ISOVUE-370) INJECTION 76%
100.0000 mL | Freq: Once | INTRAVENOUS | Status: AC | PRN
  Administered 2015-10-24: 80 mL via INTRAVENOUS

## 2015-10-24 MED ORDER — SENNOSIDES-DOCUSATE SODIUM 8.6-50 MG PO TABS: 2.0000 | ORAL_TABLET | Freq: Two times a day (BID) | ORAL | Status: DC

## 2015-10-24 MED ORDER — NICOTINE 21 MG/24HR TD PT24: 21.0000 mg | MEDICATED_PATCH | Freq: Every day | TRANSDERMAL | Status: DC

## 2015-10-24 MED ORDER — IOPAMIDOL (ISOVUE-300) INJECTION 61%: 100.0000 mL | Freq: Once | INTRAVENOUS | Status: DC | PRN

## 2015-10-24 NOTE — Progress Notes (Addendum)
EMS called RN to room due to patient's O2 reading 86% on 6L.  Dr. Erlinda Hong called; came to room and assessed patient.  Orders for CTA placed and discharge cancelled.  Patient now at 95% on 6L, has not been in any distress with no complaints.  Will continue to monitor.

## 2015-10-24 NOTE — Discharge Summary (Addendum)
Discharge Summary  Traci Thomas [id number] DOB: [birth year]  PCP: [name], NP  Admit date: 10/20/2015 Discharge date: 10/24/2015  Time spent: >61mins  Recommendations for Outpatient Follow-up:  1. F/u with PMD in SNF within a week  for hospital discharge follow up, repeat cbc/bmp at follow up 2. F/u with orthopedic Dr Alphonzo Severance in two weeks  Discharge Diagnoses:  Active Hospital Problems   Diagnosis Date Noted  . Hip fracture ([place]) 10/21/2015  . CAP (community acquired pneumonia) 10/21/2015  . COPD (chronic obstructive pulmonary disease) ([place]) 10/21/2015  . Chronic anemia 10/21/2015  . Dementia 10/21/2015  . Preoperative respiratory examination 10/21/2015    Class: Acute  . Acute on chronic respiratory failure with hypoxia ([place]) 03/25/2015    Resolved Hospital Problems   Diagnosis Date Noted Date Resolved  No resolved problems to display.    Discharge Condition: stable  Diet recommendation: regular diet  Filed Weights   10/21/15 0441  Weight: 36.7 kg (80 lb 14.5 oz)    History of present illness:  Chief Complaint: Fall.  History obtained from patient's son.  HPI: Traci Thomas is a 65 y.o. female with medical history significant of with history of dementia and COPD was brought to the ER after patient had a fall at home. Patient's son stated that he heard a sound and then when checked patient was followed with fluoroscopy. Patient was not unconscious. In the ER CT shows a right hip fracture on call orthopedic surgeon Dr. Marlou Sa was consulted by the ER physician. Patient in the ER was found to be hypoxic initially occurring 10 L oxygen. Chest x-ray showing possible infiltrates. On exam patient is not in distress and by the time I examined patient's oxygen requirement has decreased to 5 L. Patient as per the son did not have any chest pain nausea vomiting diarrhea. D-dimer is elevated and CT angiogram of the chest and CT data pending.  ED Course: CT hip shows fracture  and on-call orthopedic surgeon has been consulted.  Hospital Course:  Principal Problem:   Hip fracture ([place]) Active Problems:   Acute on chronic respiratory failure with hypoxia (H[facility])   CAP (community acquired pneumonia)   COPD (chronic obstructive pulmonary disease) (H[facility])   Chronic anemia   Dementia   Preoperative respiratory examination  #1. Right hip fracture - given the patient's advanced COPD and presently requiring more than usual oxygen patient at this time will be at high risk for intermediate risk procedure (hip fracture). Pulmonologist consulted for further recommendations and fever before surgery. Patient went through surgery pm 5/4, patient is placed on lovenox for dvt prophylasix, orthopedic Dr Marlou Sa input appreciated #2. Acute on chronic respiratory failure with COPD and possible pneumonia - patient's d-dimer is elevated, no PE on CT angiogram. Patient received nebulizer and home inhalers and empiric antibiotics for now for possible pneumonia. abx stopped at discharge, clinically less likely have pna. #3. Chronic anemia - follow CBC.  #4. Dementia - at baseline, no acute issues. #5 patient has been on home hospice piror to admission, patient is discharge to snf after hip surgery, she is likely will need to resume home hospice once discharged from snf.   3PM Addendum: patient developed hypoxia o2 sats 86% on 5liters right before being transported to SNF, on exam, patient is not in distress, lung exam on significant interval changes, discharge hold, stat CTA chest ordered.  CTA result: IMPRESSION: 1. No pulmonary emboli. 2. Bronchial wall thickening in the right base with increasing  right basilar opacity. The findings could be infectious or secondary to aspiration. Recommend follow-up to resolution. 3. Stable lower thoracic vertebral body compression fractures. These are age-indeterminate but unchanged. 4. Two small left-sided pulmonary nodules. Recommend attention  on follow-up.  Clinically patient does not have  fever, no leukocytosis, no cough, but due to increase oxygen requirement, and questionable aspiration, will start augmentin, patient need to be on strict aspiration protocol, recommend swallow eval at SNF,   Diet can be liberated if patient desires to once return to home hospice.  Patient is stable to discharge to SNF on 5/6.  DVT prophylaxis: lovenox Code Status: DO NOT RESUSCITATE.  Family Communication: Patient's son.  Disposition Plan: Rehabilitation on 5/6    Consultants:  Ortho  hospice  Procedures: Right hip intertrochanteric fracture open reduction and internal fixation on 5/4  Antibiotics:  Rocephin/zithro   Discharge Exam: BP 131/71 mmHg  Pulse 65  Temp(Src) 98.6 F (37 C) (Oral)  Resp 22  Ht 4\' 11"  (1.499 m)  Wt 36.7 kg (80 lb 14.5 oz)  BMI 16.33 kg/m2  SpO2 100%   General: Very frail, thin, confused, but awake and conversing  Cardiovascular: RRR  Respiratory: CTABL  Abdomen: Soft/ND/NT, positive BS  Musculoskeletal: right hip post op changes  Neuro: confused, but  Awake and conversing   Discharge Instructions You were cared for by a hospitalist during your hospital stay. If you have any questions about your discharge medications or the care you received while you were in the hospital after you are discharged, you can call the unit and asked to speak with the hospitalist on call if the hospitalist that took care of you is not available. Once you are discharged, your primary care physician will handle any further medical issues. Please note that NO REFILLS for any discharge medications will be authorized once you are discharged, as it is imperative that you return to your primary care physician (or establish a relationship with a primary care physician if you do not have one) for your aftercare needs so that they can reassess your need for medications and monitor your lab values.      Discharge  Instructions    Diet - low sodium heart healthy    Complete by:  As directed      Diet - low sodium heart healthy    Complete by:  As directed   Aspiration precaution     Increase activity slowly    Complete by:  As directed      Increase activity slowly    Complete by:  As directed      Weight bearing as tolerated    Complete by:  As directed             Medication List    STOP taking these medications        prednisoLONE 15 MG/5ML Soln  Commonly known as:  PRELONE      TAKE these medications        albuterol 108 (90 Base) MCG/ACT inhaler  Commonly known as:  PROVENTIL HFA;VENTOLIN HFA  Inhale 2 puffs into the lungs every 6 (six) hours as needed for wheezing or shortness of breath.     ALPRAZolam 0.5 MG tablet  Commonly known as:  XANAX  Take 1 tablet (0.5 mg total) by mouth 2 (two) times daily as needed for anxiety.     amoxicillin-clavulanate 875-125 MG tablet  Commonly known as:  AUGMENTIN  Take 1 tablet by mouth 2 (two) times  daily.     desvenlafaxine 100 MG 24 hr tablet  Commonly known as:  PRISTIQ  Take 100 mg by mouth daily.     DULERA 200-5 MCG/ACT Aero  Generic drug:  mometasone-formoterol  Inhale 2 puffs into the lungs 2 (two) times daily.     enoxaparin 30 MG/0.3ML injection  Commonly known as:  LOVENOX  Inject 0.3 mLs (30 mg total) into the skin daily.     feeding supplement (GLUCERNA SHAKE) Liqd  Take 237 mLs by mouth 2 (two) times daily between meals.     HYDROcodone-acetaminophen 5-325 MG tablet  Commonly known as:  NORCO/VICODIN  Take 1-2 tablets by mouth every 6 (six) hours as needed for moderate pain.     lansoprazole 30 MG capsule  Commonly known as:  PREVACID  Take 30 mg by mouth 2 (two) times daily before a meal.     morphine 20 MG/ML concentrated solution  Commonly known as:  ROXANOL  Take 0.5 mLs (10 mg total) by mouth every 2 (two) hours as needed for moderate pain, severe pain, anxiety or shortness of breath.     nicotine 21  mg/24hr patch  Commonly known as:  NICODERM CQ - dosed in mg/24 hours  Place 1 patch (21 mg total) onto the skin daily.     oxybutynin 5 MG tablet  Commonly known as:  DITROPAN  Take 5 mg by mouth at bedtime.     prochlorperazine 10 MG tablet  Commonly known as:  COMPAZINE  Take 10 mg by mouth every 4 (four) hours as needed for nausea or vomiting.     QUEtiapine 100 MG tablet  Commonly known as:  SEROQUEL  Take 100 mg by mouth every 6 (six) hours.     senna-docusate 8.6-50 MG tablet  Commonly known as:  Senokot-S  Take 2 tablets by mouth 2 (two) times daily.     theophylline 300 MG 12 hr tablet  Commonly known as:  THEODUR  Take 300 mg by mouth 2 (two) times daily.     tiotropium 18 MCG inhalation capsule  Commonly known as:  SPIRIVA HANDIHALER  Place 1 capsule (18 mcg total) into inhaler and inhale daily.     Vitamin D (Ergocalciferol) 50000 units Caps capsule  Commonly known as:  DRISDOL  Take 50,000 Units by mouth every 7 (seven) days.       Allergies  Allergen Reactions  . Adhesive [Tape] Other (See Comments)    Skin peels off   . Ciprofloxacin     Unknown reaction   . Prednisone Nausea And Vomiting   Follow-up Information    Follow up with [facility] .   Specialty:  [place] information:   [address] [place] [phone]       The results of significant diagnostics from this hospitalization (including imaging, microbiology, ancillary and laboratory) are listed below for reference.    Significant Diagnostic Studies: Dg Ankle Complete Right  10/21/2015  CLINICAL DATA:  Confusion. Multiple falls this week. Pain to the toes. EXAM: RIGHT ANKLE - COMPLETE 3+ VIEW COMPARISON:  None. FINDINGS: There is no evidence of fracture, dislocation, or joint effusion. There is no evidence of arthropathy or other focal bone abnormality. Soft tissues are unremarkable. IMPRESSION: Negative.  Electronically Signed   By: Lucienne Capers M.D.   On: 10/21/2015 00:11   Ct Head Wo Contrast  10/21/2015  CLINICAL DATA:  Witnessed fall.  No known head  injury. EXAM: CT HEAD WITHOUT CONTRAST TECHNIQUE: Contiguous axial images were obtained from the base of the skull through the vertex without intravenous contrast. COMPARISON:  03/25/2015 FINDINGS: Diffuse cerebral atrophy. Mild ventricular dilatation consistent with central atrophy. Low-attenuation changes in the deep white matter consistent with small vessel ischemia. No mass effect or midline shift. No abnormal extra-axial fluid collections. Gray-white matter junctions are distinct. Basal cisterns are not effaced. No evidence of acute intracranial hemorrhage. No depressed skull fractures. Opacification of mastoid air cells bilaterally. Visualized paranasal sinuses are clear. Vascular calcifications. IMPRESSION: No acute intracranial abnormalities. Chronic atrophy and small vessel ischemic changes. Electronically Signed   By: Lucienne Capers M.D.   On: 10/21/2015 03:47   Ct Angio Chest Pe W/cm &/or Wo Cm  10/24/2015  CLINICAL DATA:  Evaluate for pulmonary embolus. Acute on chronic respiratory failure. EXAM: CT ANGIOGRAPHY CHEST WITH CONTRAST TECHNIQUE: Multidetector CT imaging of the chest was performed using the standard protocol during bolus administration of intravenous contrast. Multiplanar CT image reconstructions and MIPs were obtained to evaluate the vascular anatomy. CONTRAST:  80 cc of Isovue 370 COMPARISON:  CTA of the chest Oct 21, 2015 FINDINGS: There is wall thickening associated with right lower lobe bronchi, similar to the Oct 21, 2015 study. There is opacification of a right lower lobe bronchus as well. Mild fullness in the right infrahilar region probably represents reactive nodes, improved since the comparison. There is opacity in the right lung base which has progressed in the interval. More mild opacity in the left lung base is favored to  be atelectasis. Emphysematous changes are seen in the lungs. There is a 4 mm nodule in the left apex. There is a nodular density in the lateral left lung on image 45, more conspicuous in the interval, possibly due to difference in slice selection. No other suspicious nodules. No masses. No effusions. The thoracic aorta is normal in caliber with no dissection. No pulmonary emboli. The heart is stable. No mediastinal adenopathy. Evaluation of the upper abdomen is limited. Continued hydronephrosis of the left kidney is seen with associated cortical thinning. No other abdominal abnormalities. Visualized bones are unchanged with anterior wedging of lower thoracic vertebral bodies. Review of the MIP images confirms the above findings. IMPRESSION: 1. No pulmonary emboli. 2. Bronchial wall thickening in the right base with increasing right basilar opacity. The findings could be infectious or secondary to aspiration. Recommend follow-up to resolution. 3. Stable lower thoracic vertebral body compression fractures. These are age-indeterminate but unchanged. 4. Two small left-sided pulmonary nodules. Recommend attention on follow-up. Electronically Signed   By: Dorise Bullion III M.D   On: 10/24/2015 14:49   Ct Angio Chest Pe W/cm &/or Wo Cm  10/21/2015  CLINICAL DATA:  Cough and shortness of breath. Low oxygen saturation. Elevated D-dimer. EXAM: CT ANGIOGRAPHY CHEST WITH CONTRAST TECHNIQUE: Multidetector CT imaging of the chest was performed using the standard protocol during bolus administration of intravenous contrast. Multiplanar CT image reconstructions and MIPs were obtained to evaluate the vascular anatomy. CONTRAST:  75 mL Isovue 370 COMPARISON:  01/07/2013. FINDINGS: Technically adequate study with good opacification of the central and segmental pulmonary arteries. No focal filling defects demonstrated. No evidence of significant pulmonary embolus. Normal heart size. Normal caliber thoracic aorta. No evidence of  aortic dissection. Great vessel origins are patent. No significant lymphadenopathy in the chest. Esophagus is decompressed. Emphysematous changes and scattered fibrosis throughout the lungs. No focal airspace disease or consolidation. Airways appear patent. Mild atelectasis in the  lung bases. No pleural effusions. No pneumothorax. Included portions of the upper abdominal organs demonstrate left renal hydronephrosis. Cause is not determined within the field of view of this study. Degenerative changes in the thoracic spine. Compression of the superior endplates of T4 and QA348G and T11 vertebrae. Fractures are not definitely present on two view chest from 01/15/2015. Fractures may be acute. Multiple fractures may be associated with osteoporosis or possibly metastatic disease. Clinical correlation suggested. Review of the MIP images confirms the above findings. IMPRESSION: No evidence of significant pulmonary embolus. Mild atelectasis in the lung bases. Diffuse emphysematous changes in the lungs. Hydronephrosis in the left kidney. Cause is not demonstrated within the field of view. Multiple thoracic compression fractures may be acute. Electronically Signed   By: Lucienne Capers M.D.   On: 10/21/2015 03:59   Pelvis Portable  10/21/2015  CLINICAL DATA:  Post ORIF of the right hip EXAM: PORTABLE PELVIS 1-2 VIEWS COMPARISON:  Right hip CT - 10/21/2015 FINDINGS: Post intra medullary rod fixation of the right femur and dynamic screw fixation of the right femoral neck. Alignment appears anatomic. No evidence of hardware failure or loosening, though note, the caudal tip of the femoral stem is not imaged Excreted contrast is seen within the urinary bladder. Subcutaneous emphysema about the operative site. No radiopaque foreign body. No new fracture or dislocation. IMPRESSION: Post ORIF of the right femur and femoral neck without evidence of complication. Electronically Signed   By: Sandi Mariscal M.D.   On: 10/21/2015 23:38   Ct  Hip Right Wo Contrast  10/21/2015  CLINICAL DATA:  Right hip pain after fall. EXAM: CT OF THE RIGHT HIP WITHOUT CONTRAST TECHNIQUE: Multidetector CT imaging of the right hip was performed according to the standard protocol. Multiplanar CT image reconstructions were also generated. COMPARISON:  Radiographs 10/20/2015 FINDINGS: There is an acute fracture across the base of the femoral neck, continuing across the greater trochanter. The fracture probably spares the lesser trochanter. No dislocation. Pubic rami are intact. Pubic symphysis is intact. IMPRESSION: Acute fracture across the femoral neck and greater trochanter. Electronically Signed   By: Andreas Newport M.D.   On: 10/21/2015 01:23   Dg Chest Port 1 View  10/21/2015  CLINICAL DATA:  Multiple falls this week. Low oxygen saturation. Confusion. EXAM: PORTABLE CHEST 1 VIEW COMPARISON:  03/25/2015 FINDINGS: Normal heart size and pulmonary vascularity. Infiltration or atelectasis demonstrated in the medial right lower lung. Probable emphysematous changes in the lungs. Scattered calcified granulomas. No blunting of costophrenic angles. No pneumothorax. Calcification of the aorta. IMPRESSION: Infiltration or atelectasis in the right lower lung medially. Changes could represent pneumonia in the appropriate clinical setting. Electronically Signed   By: Lucienne Capers M.D.   On: 10/21/2015 00:07   Dg Knee Complete 4 Views Right  10/21/2015  CLINICAL DATA:  Confusion. Multiple falls this week. Pain down to the toes. EXAM: RIGHT KNEE - COMPLETE 4+ VIEW COMPARISON:  None. FINDINGS: There is no evidence of fracture, dislocation, or joint effusion. There is no evidence of arthropathy or other focal bone abnormality. Soft tissues are unremarkable. Vascular calcifications. IMPRESSION: Negative. Electronically Signed   By: Lucienne Capers M.D.   On: 10/21/2015 00:10   Dg C-arm 1-60 Min-no Report  10/21/2015  CLINICAL DATA: surgery C-ARM 1-60 MINUTES Fluoroscopy was  utilized by the requesting physician.  No radiographic interpretation.   Dg Hip Unilat With Pelvis 2-3 Views Right  10/21/2015  CLINICAL DATA:  Multiple falls.  Low abdominal pain. EXAM:  DG HIP (WITH OR WITHOUT PELVIS) 2-3V RIGHT COMPARISON:  None. FINDINGS: Diffuse bone demineralization. No evidence of acute fracture or dislocation in the pelvis or right hip. No focal bone lesion or bone destruction. SI joints and symphysis pubis appear intact. IMPRESSION: No acute fracture or dislocation demonstrated in the pelvis or right hip. Electronically Signed   By: Lucienne Capers M.D.   On: 10/21/2015 00:08   Dg Femur, Min 2 Views Right  10/21/2015  CLINICAL DATA:  Post right femoral intra medullary nail EXAM: RIGHT FEMUR 2 VIEWS; DG C-ARM 1-60 MIN-NO REPORT COMPARISON:  Right hip CT - 10/21/2015 FINDINGS: 4 spot intraoperative radiographic images of the right hip and proximal aspect the right femur are provided for review. Images demonstrate the sequela of intra medullary red fixation of the proximal aspect of the femur and dynamic screw fixation of the right femoral neck. Alignment appears anatomic. No evidence of hardware failure loosening. Adjacent vascular calcifications. No radiopaque foreign body. IMPRESSION: Post ORIF of the right femur and femoral neck without evidence of complication. Electronically Signed   By: Sandi Mariscal M.D.   On: 10/21/2015 22:43    Microbiology: Recent Results (from the past 240 hour(s))  Blood culture (routine x 2)     Status: None (Preliminary result)   Collection Time: 10/21/15 12:37 AM  Result Value Ref Range Status   Specimen Description BLOOD BLOOD RIGHT FOREARM  Final   Special Requests BOTTLES DRAWN AEROBIC AND ANAEROBIC 5 CC  Final   Culture   Final    NO GROWTH 2 DAYS Performed at [facility]    Report Status PENDING  Incomplete  Blood culture (routine x 2)     Status: None (Preliminary result)   Collection Time: 10/21/15  5:35 AM  Result Value Ref  Range Status   Specimen Description BLOOD LEFT ARM  Final   Special Requests IN PEDIATRIC BOTTLE 3CC  Final   Culture   Final    NO GROWTH 2 DAYS Performed at [facility]    Report Status PENDING  Incomplete  Surgical pcr screen     Status: None   Collection Time: 10/21/15  3:47 PM  Result Value Ref Range Status   MRSA, PCR NEGATIVE NEGATIVE Final   Staphylococcus aureus NEGATIVE NEGATIVE Final    Comment:        The Xpert SA Assay (FDA approved for NASAL specimens in patients over 83 years of age), is one component of a comprehensive surveillance program.  Test performance has been validated by [facility] for patients greater than or equal to 76 year old. It is not intended to diagnose infection nor to guide or monitor treatment.      Labs: Basic Metabolic Panel:  Recent Labs Lab 10/20/15 2341 10/22/15 0457 10/23/15 0518  NA 140 142  142 138  K 4.2 3.4*  3.4* 4.0  CL 101 103  103 102  CO2 31 29  27 28   GLUCOSE 154* 139*  137* 119*  BUN 11 10  10 9   CREATININE 0.86 0.83  0.83 0.64  CALCIUM 9.5 8.7*  8.6* 8.6*   Liver Function Tests:  Recent Labs Lab 10/22/15 0457  AST 14*  ALT 12*  ALKPHOS 55  BILITOT 0.7  PROT 6.2*  ALBUMIN 3.6   No results for input(s): LIPASE, AMYLASE in the last 168 hours. No results for input(s): AMMONIA in the last 168 hours. CBC:  Recent Labs Lab 10/20/15 2341 10/22/15 0457 10/23/15 0518 10/24/15  0512  WBC 9.7 8.5  8.0 8.3 7.0  NEUTROABS 8.0*  --   --   --   HGB 11.7* 10.7*  10.7* 9.4* 8.9*  HCT 36.0 32.5*  32.6* 28.6* 27.1*  MCV 90.0 90.5  90.8 88.0 90.3  PLT 187 154  153 155 183   Cardiac Enzymes:  Recent Labs Lab 10/21/15 0949  TROPONINI <0.03   BNP: BNP (last 3 results) No results for input(s): BNP in the last 8760 hours.  ProBNP (last 3 results) No results for input(s): PROBNP in the last 8760 hours.  CBG:  Recent Labs Lab 10/23/15 1157 10/23/15 1740 10/24/15 0032  10/24/15 0556 10/24/15 1143  GLUCAP 136* 133* 110* 93 116*       Signed:  Shawnelle Spoerl MD, PhD  [facility] 10/24/2015, 3:02 PM

## 2015-10-24 NOTE — Progress Notes (Signed)
Patient temperature 99.4 when EMS took vitals at discharge.  Dr. Erlinda Hong aware, patient to be discharged on Augmentin. Report given to Randell Patient, Therapist, sports at [employer].

## 2015-10-24 NOTE — Care Management Note (Signed)
Case Management Note  Patient Details  Name: Traci Thomas MRN: [id number] Date of Birth: 03-Sep-1950                  Action/Plan: Discharge Planning: AVS reviewed:  Chart reviewed. Scheduled discharge to SNF. No NCM needs identified.    Expected Discharge Date:  10/24/2015            Expected Discharge Plan:  Skilled Nursing Facility  In-House Referral:  Clinical Social Work  Discharge planning Services  CM Consult  Post Acute Care Choice:  NA Choice offered to:  NA  DME Arranged:  N/A DME Agency:  NA  HH Arranged:  NA HH Agency:  NA  Status of Service:  Completed, signed off  Medicare Important Message Given:    Date Medicare IM Given:    Medicare IM give by:    Date Additional Medicare IM Given:    Additional Medicare Important Message give by:     If discussed at [place] of Stay Meetings, dates discussed:    Additional Comments:  Erenest Rasher, RN 10/24/2015, 10:29 AM

## 2015-10-24 NOTE — Clinical Social Work Placement (Signed)
Patient is set to discharge to [facility] today. Patient & son, [name] made aware. Discharge packet given to RN, Archer Asa. [facility] called for transport.     Raynaldo Opitz, [facility] Clinical Social Worker cell #: [phone]    CLINICAL SOCIAL WORK PLACEMENT  NOTE  Date:  10/24/2015  Patient Details  Name: Traci Thomas MRN: [id number] Date of Birth: 1950/12/25  Clinical Social Work is seeking post-discharge placement for this patient at the [place] level of care (*CSW will initial, date and re-position this form in  chart as items are completed):  Yes   Patient/family provided with [facility] Work Department's list of facilities offering this level of care within the geographic area requested by the patient (or if unable, by the patient's family).  Yes   Patient/family informed of their freedom to choose among providers that offer the needed level of care, that participate in Medicare, Medicaid or managed care program needed by the patient, have an available bed and are willing to accept the patient.  Yes   Patient/family informed of [facility]'s ownership interest in [facility] and [facility], as well as of the fact that they are under no obligation to receive care at these facilities.  PASRR submitted to EDS on 10/22/15     PASRR number received on 10/22/15     Existing PASRR number confirmed on       FL2 transmitted to all facilities in geographic area requested by pt/family on 10/22/15     FL2 transmitted to all facilities within larger geographic area on       Patient informed that his/her managed care company has contracts with or will negotiate with certain facilities, including the following:        Yes   Patient/family informed of bed offers received.  Patient chooses bed at [facility]     Physician recommends and patient chooses bed at      Patient to be transferred to [facility] on 10/24/15.  Patient to be transferred to facility by [facility]     Patient family notified on 10/24/15 of transfer.  Name of family member notified:  patient's son, [name]     PHYSICIAN       Additional Comment:    _______________________________________________ Standley Brooking, LCSW 10/24/2015, 10:24 AM

## 2015-10-24 NOTE — Progress Notes (Addendum)
[facility] called for transport. Patient's son, [name] & RN, Susie made aware.    Raynaldo Opitz, [facility] Clinical Social Worker cell #: [phone]

## 2015-10-26 LAB — CULTURE, BLOOD (ROUTINE X 2)
Culture: NO GROWTH
Culture: NO GROWTH

## 2016-09-28 ENCOUNTER — Inpatient Hospital Stay (HOSPITAL_COMMUNITY)
Admission: EM | Admit: 2016-09-28 | Discharge: 2016-10-17 | DRG: 871 | Disposition: A | Discharge status: Skilled Nursing Facility | Payer: Medicare Other | Attending: Internal Medicine | Admitting: Internal Medicine

## 2016-09-28 ENCOUNTER — Emergency Department (HOSPITAL_COMMUNITY): Payer: Medicare Other

## 2016-09-28 ENCOUNTER — Encounter (HOSPITAL_COMMUNITY): Payer: Self-pay

## 2016-09-28 ENCOUNTER — Inpatient Hospital Stay (HOSPITAL_COMMUNITY): Payer: Medicare Other

## 2016-09-28 DIAGNOSIS — Z888 Allergy status to other drugs, medicaments and biological substances status: Secondary | ICD-10-CM

## 2016-09-28 DIAGNOSIS — K5900 Constipation, unspecified: Secondary | ICD-10-CM | POA: Diagnosis not present

## 2016-09-28 DIAGNOSIS — Z825 Family history of asthma and other chronic lower respiratory diseases: Secondary | ICD-10-CM | POA: Diagnosis not present

## 2016-09-28 DIAGNOSIS — Y95 Nosocomial condition: Secondary | ICD-10-CM | POA: Diagnosis present

## 2016-09-28 DIAGNOSIS — Z9981 Dependence on supplemental oxygen: Secondary | ICD-10-CM

## 2016-09-28 DIAGNOSIS — A419 Sepsis, unspecified organism: Secondary | ICD-10-CM | POA: Diagnosis present

## 2016-09-28 DIAGNOSIS — D638 Anemia in other chronic diseases classified elsewhere: Secondary | ICD-10-CM | POA: Diagnosis present

## 2016-09-28 DIAGNOSIS — J441 Chronic obstructive pulmonary disease with (acute) exacerbation: Secondary | ICD-10-CM | POA: Diagnosis present

## 2016-09-28 DIAGNOSIS — Z881 Allergy status to other antibiotic agents status: Secondary | ICD-10-CM | POA: Diagnosis not present

## 2016-09-28 DIAGNOSIS — F329 Major depressive disorder, single episode, unspecified: Secondary | ICD-10-CM | POA: Diagnosis not present

## 2016-09-28 DIAGNOSIS — Y92230 Patient room in hospital as the place of occurrence of the external cause: Secondary | ICD-10-CM | POA: Diagnosis not present

## 2016-09-28 DIAGNOSIS — R0902 Hypoxemia: Secondary | ICD-10-CM

## 2016-09-28 DIAGNOSIS — I509 Heart failure, unspecified: Secondary | ICD-10-CM | POA: Diagnosis not present

## 2016-09-28 DIAGNOSIS — G9341 Metabolic encephalopathy: Secondary | ICD-10-CM | POA: Diagnosis present

## 2016-09-28 DIAGNOSIS — R000 Tachycardia, unspecified: Secondary | ICD-10-CM | POA: Diagnosis present

## 2016-09-28 DIAGNOSIS — G9340 Encephalopathy, unspecified: Secondary | ICD-10-CM | POA: Diagnosis not present

## 2016-09-28 DIAGNOSIS — J9600 Acute respiratory failure, unspecified whether with hypoxia or hypercapnia: Secondary | ICD-10-CM | POA: Diagnosis present

## 2016-09-28 DIAGNOSIS — J9602 Acute respiratory failure with hypercapnia: Secondary | ICD-10-CM | POA: Diagnosis not present

## 2016-09-28 DIAGNOSIS — J9622 Acute and chronic respiratory failure with hypercapnia: Secondary | ICD-10-CM | POA: Diagnosis present

## 2016-09-28 DIAGNOSIS — J9801 Acute bronchospasm: Secondary | ICD-10-CM | POA: Diagnosis present

## 2016-09-28 DIAGNOSIS — J9601 Acute respiratory failure with hypoxia: Secondary | ICD-10-CM

## 2016-09-28 DIAGNOSIS — J440 Chronic obstructive pulmonary disease with acute lower respiratory infection: Secondary | ICD-10-CM | POA: Diagnosis present

## 2016-09-28 DIAGNOSIS — Z7951 Long term (current) use of inhaled steroids: Secondary | ICD-10-CM

## 2016-09-28 DIAGNOSIS — I10 Essential (primary) hypertension: Secondary | ICD-10-CM | POA: Diagnosis not present

## 2016-09-28 DIAGNOSIS — R140 Abdominal distension (gaseous): Secondary | ICD-10-CM

## 2016-09-28 DIAGNOSIS — R6520 Severe sepsis without septic shock: Secondary | ICD-10-CM | POA: Diagnosis present

## 2016-09-28 DIAGNOSIS — J181 Lobar pneumonia, unspecified organism: Secondary | ICD-10-CM | POA: Diagnosis present

## 2016-09-28 DIAGNOSIS — F418 Other specified anxiety disorders: Secondary | ICD-10-CM | POA: Diagnosis present

## 2016-09-28 DIAGNOSIS — Z79891 Long term (current) use of opiate analgesic: Secondary | ICD-10-CM

## 2016-09-28 DIAGNOSIS — J9620 Acute and chronic respiratory failure, unspecified whether with hypoxia or hypercapnia: Secondary | ICD-10-CM

## 2016-09-28 DIAGNOSIS — R0600 Dyspnea, unspecified: Secondary | ICD-10-CM

## 2016-09-28 DIAGNOSIS — J438 Other emphysema: Secondary | ICD-10-CM | POA: Diagnosis not present

## 2016-09-28 DIAGNOSIS — F0390 Unspecified dementia without behavioral disturbance: Secondary | ICD-10-CM | POA: Diagnosis present

## 2016-09-28 DIAGNOSIS — J9621 Acute and chronic respiratory failure with hypoxia: Secondary | ICD-10-CM | POA: Diagnosis present

## 2016-09-28 DIAGNOSIS — Z91048 Other nonmedicinal substance allergy status: Secondary | ICD-10-CM

## 2016-09-28 DIAGNOSIS — Z79899 Other long term (current) drug therapy: Secondary | ICD-10-CM

## 2016-09-28 DIAGNOSIS — I5033 Acute on chronic diastolic (congestive) heart failure: Secondary | ICD-10-CM | POA: Diagnosis present

## 2016-09-28 DIAGNOSIS — J449 Chronic obstructive pulmonary disease, unspecified: Secondary | ICD-10-CM

## 2016-09-28 DIAGNOSIS — G894 Chronic pain syndrome: Secondary | ICD-10-CM | POA: Diagnosis present

## 2016-09-28 DIAGNOSIS — Z833 Family history of diabetes mellitus: Secondary | ICD-10-CM | POA: Diagnosis not present

## 2016-09-28 DIAGNOSIS — E1165 Type 2 diabetes mellitus with hyperglycemia: Secondary | ICD-10-CM | POA: Diagnosis present

## 2016-09-28 DIAGNOSIS — E872 Acidosis: Secondary | ICD-10-CM | POA: Diagnosis present

## 2016-09-28 DIAGNOSIS — R109 Unspecified abdominal pain: Secondary | ICD-10-CM | POA: Diagnosis not present

## 2016-09-28 DIAGNOSIS — M549 Dorsalgia, unspecified: Secondary | ICD-10-CM | POA: Diagnosis present

## 2016-09-28 DIAGNOSIS — T380X5A Adverse effect of glucocorticoids and synthetic analogues, initial encounter: Secondary | ICD-10-CM | POA: Diagnosis not present

## 2016-09-28 DIAGNOSIS — R339 Retention of urine, unspecified: Secondary | ICD-10-CM | POA: Diagnosis present

## 2016-09-28 DIAGNOSIS — I110 Hypertensive heart disease with heart failure: Secondary | ICD-10-CM | POA: Diagnosis present

## 2016-09-28 DIAGNOSIS — J189 Pneumonia, unspecified organism: Secondary | ICD-10-CM

## 2016-09-28 DIAGNOSIS — R0603 Acute respiratory distress: Secondary | ICD-10-CM | POA: Diagnosis not present

## 2016-09-28 DIAGNOSIS — F17200 Nicotine dependence, unspecified, uncomplicated: Secondary | ICD-10-CM | POA: Diagnosis present

## 2016-09-28 DIAGNOSIS — R1030 Lower abdominal pain, unspecified: Secondary | ICD-10-CM | POA: Diagnosis not present

## 2016-09-28 LAB — URINALYSIS, ROUTINE W REFLEX MICROSCOPIC
Bilirubin Urine: NEGATIVE
Glucose, UA: 150 mg/dL — AB
Ketones, ur: NEGATIVE mg/dL
Leukocytes, UA: NEGATIVE
Nitrite: NEGATIVE
PROTEIN: NEGATIVE mg/dL
SPECIFIC GRAVITY, URINE: 1.02 (ref 1.005–1.030)
pH: 5 (ref 5.0–8.0)

## 2016-09-28 LAB — BLOOD GAS, ARTERIAL
ACID-BASE DEFICIT: 1.6 mmol/L (ref 0.0–2.0)
ACID-BASE EXCESS: 0.1 mmol/L (ref 0.0–2.0)
BICARBONATE: 26.2 mmol/L (ref 20.0–28.0)
Bicarbonate: 26.5 mmol/L (ref 20.0–28.0)
DELIVERY SYSTEMS: POSITIVE
DRAWN BY: 331471
DRAWN BY: 308601
Delivery systems: POSITIVE
Expiratory PAP: 6
Expiratory PAP: 6
FIO2: 50
FIO2: 40
INSPIRATORY PAP: 16
Inspiratory PAP: 12
MODE: POSITIVE
O2 SAT: 97 %
O2 Saturation: 95.8 %
PATIENT TEMPERATURE: 98.6
PH ART: 7.239 — AB (ref 7.350–7.450)
Patient temperature: 98.6
pCO2 arterial: 63.5 mmHg — ABNORMAL HIGH (ref 32.0–48.0)
pCO2 arterial: 55.3 mmHg — ABNORMAL HIGH (ref 32.0–48.0)
pH, Arterial: 7.302 — ABNORMAL LOW (ref 7.350–7.450)
pO2, Arterial: 91.8 mmHg (ref 83.0–108.0)
pO2, Arterial: 77.3 mmHg — ABNORMAL LOW (ref 83.0–108.0)

## 2016-09-28 LAB — COMPREHENSIVE METABOLIC PANEL
ALBUMIN: 4.1 g/dL (ref 3.5–5.0)
ALK PHOS: 131 U/L — AB (ref 38–126)
ALT: 9 U/L — ABNORMAL LOW (ref 14–54)
ANION GAP: 10 (ref 5–15)
AST: 16 U/L (ref 15–41)
BUN: 23 mg/dL — ABNORMAL HIGH (ref 6–20)
CHLORIDE: 101 mmol/L (ref 101–111)
CO2: 29 mmol/L (ref 22–32)
Calcium: 9.2 mg/dL (ref 8.9–10.3)
Creatinine, Ser: 0.9 mg/dL (ref 0.44–1.00)
GFR calc Af Amer: >60 mL/min (ref >60)
GFR calc non Af Amer: >60 mL/min (ref >60)
GLUCOSE: 231 mg/dL — AB (ref 65–99)
POTASSIUM: 4.4 mmol/L (ref 3.5–5.1)
SODIUM: 140 mmol/L (ref 135–145)
Total Bilirubin: 0.5 mg/dL (ref 0.3–1.2)
Total Protein: 8 g/dL (ref 6.5–8.1)

## 2016-09-28 LAB — LACTIC ACID, PLASMA: Lactic Acid, Venous: 5.2 mmol/L (ref 0.5–1.9)

## 2016-09-28 LAB — CBC WITH DIFFERENTIAL/PLATELET
BASOS PCT: 0 %
Basophils Absolute: 0 10*3/uL (ref 0.0–0.1)
EOS PCT: 0 %
Eosinophils Absolute: 0 10*3/uL (ref 0.0–0.7)
HEMATOCRIT: 37.5 % (ref 36.0–46.0)
HEMOGLOBIN: 11.6 g/dL — AB (ref 12.0–15.0)
Lymphocytes Relative: 6 %
Lymphs Abs: 1 10*3/uL (ref 0.7–4.0)
MCH: 27.8 pg (ref 26.0–34.0)
MCHC: 30.9 g/dL (ref 30.0–36.0)
MCV: 89.9 fL (ref 78.0–100.0)
MONOS PCT: 8 %
Monocytes Absolute: 1.3 10*3/uL — ABNORMAL HIGH (ref 0.1–1.0)
Neutro Abs: 13.8 10*3/uL — ABNORMAL HIGH (ref 1.7–7.7)
Neutrophils Relative %: 86 %
Platelets: 278 10*3/uL (ref 150–400)
RBC: 4.17 MIL/uL (ref 3.87–5.11)
RDW: 15.6 % — ABNORMAL HIGH (ref 11.5–15.5)
WBC Morphology: INCREASED
WBC: 16.1 10*3/uL — ABNORMAL HIGH (ref 4.0–10.5)

## 2016-09-28 LAB — GLUCOSE, CAPILLARY
Glucose-Capillary: 303 mg/dL — ABNORMAL HIGH (ref 65–99)
Glucose-Capillary: 287 mg/dL — ABNORMAL HIGH (ref 65–99)
Glucose-Capillary: 123 mg/dL — ABNORMAL HIGH (ref 65–99)

## 2016-09-28 LAB — CREATININE, SERUM
CREATININE: 1.01 mg/dL — AB (ref 0.44–1.00)
GFR calc non Af Amer: 58 mL/min — ABNORMAL LOW (ref >60)

## 2016-09-28 LAB — MRSA PCR SCREENING: MRSA BY PCR: NEGATIVE

## 2016-09-28 LAB — I-STAT CG4 LACTIC ACID, ED
Lactic Acid, Venous: 1.85 mmol/L (ref 0.5–1.9)
Lactic Acid, Venous: 3.03 mmol/L (ref 0.5–1.9)

## 2016-09-28 LAB — CBC
HCT: 35.1 % — ABNORMAL LOW (ref 36.0–46.0)
HEMOGLOBIN: 10.7 g/dL — AB (ref 12.0–15.0)
MCH: 28.7 pg (ref 26.0–34.0)
MCHC: 30.5 g/dL (ref 30.0–36.0)
MCV: 94.1 fL (ref 78.0–100.0)
PLATELETS: 257 10*3/uL (ref 150–400)
RBC: 3.73 MIL/uL — AB (ref 3.87–5.11)
RDW: 16.2 % — ABNORMAL HIGH (ref 11.5–15.5)
WBC: 21.1 10*3/uL — AB (ref 4.0–10.5)

## 2016-09-28 LAB — STREP PNEUMONIAE URINARY ANTIGEN: Strep Pneumo Urinary Antigen: NEGATIVE

## 2016-09-28 LAB — PHOSPHORUS: PHOSPHORUS: 2.7 mg/dL (ref 2.5–4.6)

## 2016-09-28 LAB — MAGNESIUM: MAGNESIUM: 2 mg/dL (ref 1.7–2.4)

## 2016-09-28 LAB — PROCALCITONIN: Procalcitonin: 31.48 ng/mL

## 2016-09-28 LAB — TROPONIN I: Troponin I: <0.03 ng/mL (ref <0.03)

## 2016-09-28 MED ORDER — LACTULOSE 10 GM/15ML PO SOLN
30.0000 g | Freq: Two times a day (BID) | ORAL | Status: DC
  Administered 2016-09-28 – 2016-10-01 (×3): 30 g via ORAL
  Filled 2016-09-28 (×5): qty 45

## 2016-09-28 MED ORDER — DEXTROSE 5 % IV SOLN: 1.0000 g | Freq: Once | INTRAVENOUS | Status: DC

## 2016-09-28 MED ORDER — ALBUTEROL (5 MG/ML) CONTINUOUS INHALATION SOLN
10.0000 mg/h | INHALATION_SOLUTION | RESPIRATORY_TRACT | Status: DC
  Administered 2016-09-28: 10 mg/h via RESPIRATORY_TRACT

## 2016-09-28 MED ORDER — CHLORHEXIDINE GLUCONATE 0.12 % MT SOLN
15.0000 mL | Freq: Two times a day (BID) | OROMUCOSAL | Status: DC
  Administered 2016-09-29 – 2016-10-17 (×28): 15 mL via OROMUCOSAL
  Filled 2016-09-28 (×31): qty 15

## 2016-09-28 MED ORDER — ACETAMINOPHEN 325 MG PO TABS
650.0000 mg | ORAL_TABLET | Freq: Once | ORAL | Status: AC
  Administered 2016-09-28: 650 mg via ORAL
  Filled 2016-09-28: qty 2

## 2016-09-28 MED ORDER — BISACODYL 5 MG PO TBEC
5.0000 mg | DELAYED_RELEASE_TABLET | Freq: Every day | ORAL | Status: DC | PRN
  Administered 2016-09-28 – 2016-10-02 (×2): 5 mg via ORAL
  Filled 2016-09-28 (×2): qty 1

## 2016-09-28 MED ORDER — SODIUM CHLORIDE 0.9 % IV BOLUS (SEPSIS)
500.0000 mL | Freq: Once | INTRAVENOUS | Status: AC
  Administered 2016-09-28: 500 mL via INTRAVENOUS

## 2016-09-28 MED ORDER — ALBUTEROL (5 MG/ML) CONTINUOUS INHALATION SOLN
5.0000 mg/h | INHALATION_SOLUTION | RESPIRATORY_TRACT | Status: DC
  Administered 2016-09-28: 5 mg/h via RESPIRATORY_TRACT
  Filled 2016-09-28: qty 20

## 2016-09-28 MED ORDER — POLYMYXIN B-TRIMETHOPRIM 10000-0.1 UNIT/ML-% OP SOLN
1.0000 [drp] | Freq: Four times a day (QID) | OPHTHALMIC | Status: DC
  Administered 2016-09-28 – 2016-10-04 (×24): 1 [drp] via OPHTHALMIC
  Filled 2016-09-28: qty 10

## 2016-09-28 MED ORDER — LACTATED RINGERS IV SOLN
INTRAVENOUS | Status: DC
  Administered 2016-09-28 – 2016-09-29 (×2): via INTRAVENOUS

## 2016-09-28 MED ORDER — ORAL CARE MOUTH RINSE
15.0000 mL | Freq: Two times a day (BID) | OROMUCOSAL | Status: DC
  Administered 2016-09-29 – 2016-10-17 (×10): 15 mL via OROMUCOSAL

## 2016-09-28 MED ORDER — HYPROMELLOSE (GONIOSCOPIC) 2.5 % OP SOLN
1.0000 [drp] | Freq: Two times a day (BID) | OPHTHALMIC | Status: DC | PRN
Start: 2016-09-28 — End: 2016-09-28

## 2016-09-28 MED ORDER — IPRATROPIUM-ALBUTEROL 0.5-2.5 (3) MG/3ML IN SOLN
3.0000 mL | Freq: Four times a day (QID) | RESPIRATORY_TRACT | Status: DC
  Administered 2016-09-28 – 2016-09-29 (×3): 3 mL via RESPIRATORY_TRACT
  Filled 2016-09-28 (×3): qty 3

## 2016-09-28 MED ORDER — DEXTROSE 5 % IV SOLN
2.0000 g | Freq: Once | INTRAVENOUS | Status: AC
  Administered 2016-09-28: 2 g via INTRAVENOUS
  Filled 2016-09-28: qty 2

## 2016-09-28 MED ORDER — SODIUM CHLORIDE 0.9 % IV BOLUS (SEPSIS)
1000.0000 mL | Freq: Once | INTRAVENOUS | Status: AC
Start: 2016-09-28 — End: 2016-09-28
  Administered 2016-09-28: 1000 mL via INTRAVENOUS

## 2016-09-28 MED ORDER — HEPARIN SODIUM (PORCINE) 5000 UNIT/ML IJ SOLN
5000.0000 [IU] | Freq: Three times a day (TID) | INTRAMUSCULAR | Status: DC
  Administered 2016-09-28 – 2016-10-17 (×55): 5000 [IU] via SUBCUTANEOUS
  Filled 2016-09-28 (×56): qty 1

## 2016-09-28 MED ORDER — DEXTROSE 5 % IV SOLN
2.0000 g | Freq: Three times a day (TID) | INTRAVENOUS | Status: AC
  Administered 2016-09-28 – 2016-10-04 (×19): 2 g via INTRAVENOUS
  Filled 2016-09-28 (×20): qty 2

## 2016-09-28 MED ORDER — POLYVINYL ALCOHOL 1.4 % OP SOLN
1.0000 [drp] | Freq: Two times a day (BID) | OPHTHALMIC | Status: DC | PRN
  Administered 2016-10-02: 1 [drp] via OPHTHALMIC
  Filled 2016-09-28: qty 15

## 2016-09-28 MED ORDER — INSULIN ASPART 100 UNIT/ML ~~LOC~~ SOLN
0.0000 [IU] | SUBCUTANEOUS | Status: DC
  Administered 2016-09-28: 11 [IU] via SUBCUTANEOUS
  Administered 2016-09-28: 8 [IU] via SUBCUTANEOUS
  Administered 2016-09-28: 2 [IU] via SUBCUTANEOUS
  Administered 2016-09-29 (×2): 3 [IU] via SUBCUTANEOUS

## 2016-09-28 MED ORDER — FENTANYL CITRATE (PF) 100 MCG/2ML IJ SOLN
12.5000 ug | INTRAMUSCULAR | Status: DC | PRN
  Administered 2016-09-28 – 2016-10-02 (×17): 12.5 ug via INTRAVENOUS
  Administered 2016-10-03: 22:00:00 via INTRAVENOUS
  Administered 2016-10-03 – 2016-10-13 (×4): 12.5 ug via INTRAVENOUS
  Filled 2016-09-28 (×23): qty 2

## 2016-09-28 MED ORDER — OSELTAMIVIR PHOSPHATE 30 MG PO CAPS
30.0000 mg | ORAL_CAPSULE | Freq: Two times a day (BID) | ORAL | Status: DC
  Administered 2016-09-28 – 2016-09-30 (×4): 30 mg via ORAL
  Filled 2016-09-28 (×5): qty 1

## 2016-09-28 MED ORDER — SODIUM CHLORIDE 0.9 % IV SOLN
500.0000 mg | Freq: Two times a day (BID) | INTRAVENOUS | Status: DC
  Administered 2016-09-29 – 2016-09-30 (×3): 500 mg via INTRAVENOUS
  Filled 2016-09-28 (×4): qty 500

## 2016-09-28 MED ORDER — DEXTROSE 5 % IV SOLN
500.0000 mg | INTRAVENOUS | Status: DC
  Administered 2016-09-29 – 2016-10-04 (×6): 500 mg via INTRAVENOUS
  Filled 2016-09-28 (×6): qty 500

## 2016-09-28 MED ORDER — LORAZEPAM 2 MG/ML IJ SOLN
0.5000 mg | Freq: Four times a day (QID) | INTRAMUSCULAR | Status: DC | PRN
  Administered 2016-09-28 – 2016-10-02 (×5): 0.5 mg via INTRAVENOUS
  Filled 2016-09-28 (×6): qty 1

## 2016-09-28 MED ORDER — DEXTROSE 5 % IV SOLN
500.0000 mg | Freq: Once | INTRAVENOUS | Status: AC
  Administered 2016-09-28: 500 mg via INTRAVENOUS
  Filled 2016-09-28: qty 500

## 2016-09-28 MED ORDER — SODIUM CHLORIDE 0.9 % IV BOLUS (SEPSIS)
250.0000 mL | Freq: Once | INTRAVENOUS | Status: AC
  Administered 2016-09-28: 250 mL via INTRAVENOUS

## 2016-09-28 MED ORDER — VANCOMYCIN HCL IN DEXTROSE 1-5 GM/200ML-% IV SOLN
1000.0000 mg | Freq: Once | INTRAVENOUS | Status: AC
  Administered 2016-09-28: 1000 mg via INTRAVENOUS
  Filled 2016-09-28: qty 200

## 2016-09-28 MED ORDER — DEXTROSE 5 % IV SOLN: 1.0000 g | INTRAVENOUS | Status: DC

## 2016-09-28 MED ORDER — SODIUM CHLORIDE 0.9 % IV BOLUS (SEPSIS)
1000.0000 mL | Freq: Once | INTRAVENOUS | Status: AC
  Administered 2016-09-28: 1000 mL via INTRAVENOUS

## 2016-09-28 MED ORDER — METHYLPREDNISOLONE SODIUM SUCC 40 MG IJ SOLR
40.0000 mg | Freq: Two times a day (BID) | INTRAMUSCULAR | Status: DC
  Administered 2016-09-28 – 2016-09-29 (×2): 40 mg via INTRAVENOUS
  Filled 2016-09-28 (×2): qty 1

## 2016-09-28 NOTE — ED Provider Notes (Signed)
[facility] DEPT Provider Note   CSN: [id number] Arrival date & time: 09/28/16  2725     History   Chief Complaint Chief Complaint  Patient presents with  . Respiratory Distress  . Fever    HPI Traci Thomas is a 66 y.o. female with a past medical history significant for diabetes, asthma, and COPD who presents with respiratory distress. Patient is accompanied by family who reports that for the last 4 days, patient has been having worsening fatigue and shortness of breath. Patient is also had a dry cough. Patient denies fevers or chills but agrees that she has been having a cough. Patient found to be febrile on arrival at 102.5, tachycardic at 139, and That 30. Patient was reportedly saturating in the 76% range with EMS on room air on their arrival to her nursing home. Patient is now on BiPAP and is maintaining her oxygen saturation saturations in the mid 90s.  Patient says that she has had diarrhea for the last few days but denies any chest pain or abdominal pain. She denies any urinary symptoms. She denies any headaches, vision changes, or any neurologic complaints. She denies any recent injuries. EMS gave the patient albuterol, Atrovent, Solu-Medrol, and magnesium in route as well as placing the patient on both a nonrebreather and subsequently BiPAP.       The history is provided by the patient and a relative.  Shortness of Breath  This is a new problem. The average episode lasts 4 days. The problem occurs continuously.The current episode started more than 2 days ago. The problem has been gradually worsening. Associated symptoms include a fever, rhinorrhea, cough and wheezing. Pertinent negatives include no headaches, no sputum production, no chest pain, no syncope, no vomiting, no abdominal pain, no rash, no leg pain and no leg swelling. She has tried beta-agonist inhalers for the symptoms. The treatment provided no relief. She has had prior hospitalizations. Associated medical issues  include asthma, COPD and chronic lung disease.    Past Medical History:  Diagnosis Date  . Asthma   . Back pain   . COPD (chronic obstructive pulmonary disease) (HCC)    Home O2 3lpm, theophylline  . Diabetes mellitus without complication [facility])     Patient Active Problem List   Diagnosis Date Noted  . CAP (community acquired pneumonia) 10/21/2015  . Hip fracture ([place]) 10/21/2015  . Acute respiratory failure with hypoxia ([place]) 10/21/2015  . COPD (chronic obstructive pulmonary disease) ([place]) 10/21/2015  . Chronic anemia 10/21/2015  . Dementia 10/21/2015  . Preoperative respiratory examination 10/21/2015    Class: Acute  . Closed right hip fracture ([place])   . Encounter for palliative care   . COPD exacerbation ([place]) 03/25/2015  . Acute on chronic respiratory failure with hypoxia ([place]) 03/25/2015  . Somnolence 03/25/2015  . Acute delirium 03/25/2015  . Diabetes mellitus type 2 in nonobese ([place]) 03/25/2015  . Depression 03/25/2015    Past Surgical History:  Procedure Laterality Date  . APPENDECTOMY    . FEMUR IM NAIL Right 10/21/2015   Procedure: INTRAMEDULLARY (IM) NAIL FEMORAL;  Surgeon: Meredith Pel, MD;  Location: [facility] ORS;  Service: Orthopedics;  Laterality: Right;  . TONSILLECTOMY      OB History    No data available       Home Medications    Prior to Admission medications   Medication Sig Start Date End Date Taking? Authorizing Provider  albuterol (PROVENTIL HFA;VENTOLIN HFA) 108 (90 BASE) MCG/ACT inhaler Inhale 2 puffs into  the lungs every 6 (six) hours as needed for wheezing or shortness of breath.    Historical Provider, MD  ALPRAZolam Duanne Moron) 0.5 MG tablet Take 1 tablet (0.5 mg total) by mouth 2 (two) times daily as needed for anxiety. 10/24/15   Florencia Reasons, MD  amoxicillin-clavulanate (AUGMENTIN) 875-125 MG tablet Take 1 tablet by mouth 2 (two) times daily. 10/24/15   Florencia Reasons, MD  desvenlafaxine (PRISTIQ) 100 MG 24 hr tablet Take 100 mg by mouth daily.     Historical Provider, MD  enoxaparin (LOVENOX) 30 MG/0.3ML injection Inject 0.3 mLs (30 mg total) into the skin daily. 10/23/15   Meredith Pel, MD  feeding supplement, GLUCERNA SHAKE, (GLUCERNA SHAKE) LIQD Take 237 mLs by mouth 2 (two) times daily between meals. Patient not taking: Reported on 10/21/2015 03/31/15   Janece Canterbury, MD  HYDROcodone-acetaminophen (NORCO/VICODIN) 5-325 MG tablet Take 1-2 tablets by mouth every 6 (six) hours as needed for moderate pain. 10/23/15   Meredith Pel, MD  lansoprazole (PREVACID) 30 MG capsule Take 30 mg by mouth 2 (two) times daily before a meal.    Historical Provider, MD  mometasone-formoterol (DULERA) 200-5 MCG/ACT AERO Inhale 2 puffs into the lungs 2 (two) times daily.    Historical Provider, MD  morphine (ROXANOL) 20 MG/ML concentrated solution Take 0.5 mLs (10 mg total) by mouth every 2 (two) hours as needed for moderate pain, severe pain, anxiety or shortness of breath. 10/24/15   Florencia Reasons, MD  nicotine (NICODERM CQ - DOSED IN MG/24 HOURS) 21 mg/24hr patch Place 1 patch (21 mg total) onto the skin daily. 10/24/15   Florencia Reasons, MD  oxybutynin (DITROPAN) 5 MG tablet Take 5 mg by mouth at bedtime.    Historical Provider, MD  prochlorperazine (COMPAZINE) 10 MG tablet Take 10 mg by mouth every 4 (four) hours as needed for nausea or vomiting.    Historical Provider, MD  QUEtiapine (SEROQUEL) 100 MG tablet Take 100 mg by mouth every 6 (six) hours.    Historical Provider, MD  senna-docusate (SENOKOT-S) 8.6-50 MG tablet Take 2 tablets by mouth 2 (two) times daily. 10/24/15   Florencia Reasons, MD  theophylline (THEODUR) 300 MG 12 hr tablet Take 300 mg by mouth 2 (two) times daily.    Historical Provider, MD  tiotropium (SPIRIVA HANDIHALER) 18 MCG inhalation capsule Place 1 capsule (18 mcg total) into inhaler and inhale daily. 03/31/15   Janece Canterbury, MD  Vitamin D, Ergocalciferol, (DRISDOL) 50000 units CAPS capsule Take 50,000 Units by mouth every 7 (seven) days.     Historical Provider, MD    Family History Family History  Problem Relation Age of Onset  . COPD Mother   . Diabetes      Social History Social History  Substance Use Topics  . Smoking status: Current Every Day Smoker    Packs/day: 1.00  . Smokeless tobacco: Not on file  . Alcohol use 0.0 oz/week     Comment: occasional beer     Allergies   Adhesive [tape]; Ciprofloxacin; and Prednisone   Review of Systems Review of Systems  Constitutional: Positive for chills, fatigue and fever. Negative for diaphoresis.  HENT: Positive for congestion and rhinorrhea.   Eyes: Negative for visual disturbance.  Respiratory: Positive for cough, shortness of breath and wheezing. Negative for sputum production and chest tightness.   Cardiovascular: Negative for chest pain, leg swelling and syncope.  Gastrointestinal: Negative for abdominal pain, diarrhea, nausea and vomiting.  Genitourinary: Negative for frequency.  Skin:  Negative for rash.  Neurological: Negative for light-headedness, numbness and headaches.  Psychiatric/Behavioral: Negative for agitation and confusion.  All other systems reviewed and are negative.    Physical Exam Updated Vital Signs BP 136/70   Pulse (!) 139   Temp (!) 102.5 F (39.2 C) (Oral)   Resp (!) 30   SpO2 95%   Physical Exam  Constitutional: She is oriented to person, place, and time. She appears well-developed and well-nourished. No distress.  HENT:  Head: Normocephalic and atraumatic.  Right Ear: External ear normal.  Left Ear: External ear normal.  Nose: Nose normal.  Mouth/Throat: Oropharynx is clear and moist. No oropharyngeal exudate.  Eyes: Conjunctivae and EOM are normal. Pupils are equal, round, and reactive to light.  Neck: Normal range of motion. Neck supple.  Cardiovascular: Normal heart sounds and intact distal pulses.  Tachycardia present.   No murmur heard. Pulmonary/Chest: No stridor. Tachypnea noted. She is in respiratory distress.  She has wheezes. She has rhonchi. She has rales. She exhibits no tenderness.  Abdominal: Soft. She exhibits no distension. There is no tenderness. There is no rebound.  Musculoskeletal: She exhibits no tenderness.  Neurological: She is alert and oriented to person, place, and time. She has normal reflexes. No sensory deficit. She exhibits normal muscle tone.  Skin: Skin is warm. No rash noted. She is not diaphoretic. No erythema.  Psychiatric: She has a normal mood and affect.  Nursing note and vitals reviewed.    ED Treatments / Results  Labs (all labs ordered are listed, but only abnormal results are displayed) Labs Reviewed  COMPREHENSIVE METABOLIC PANEL - Abnormal; Notable for the following:       Result Value   Glucose, Bld 231 (*)    BUN 23 (*)    ALT 9 (*)    Alkaline Phosphatase 131 (*)    All other components within normal limits  CBC WITH DIFFERENTIAL/PLATELET - Abnormal; Notable for the following:    WBC 16.1 (*)    Hemoglobin 11.6 (*)    RDW 15.6 (*)    Neutro Abs 13.8 (*)    Monocytes Absolute 1.3 (*)    All other components within normal limits  URINALYSIS, ROUTINE W REFLEX MICROSCOPIC - Abnormal; Notable for the following:    Glucose, UA 150 (*)    Hgb urine dipstick MODERATE (*)    Bacteria, UA RARE (*)    Squamous Epithelial / LPF 0-5 (*)    All other components within normal limits  CBC - Abnormal; Notable for the following:    WBC 21.1 (*)    RBC 3.73 (*)    Hemoglobin 10.7 (*)    HCT 35.1 (*)    RDW 16.2 (*)    All other components within normal limits  CREATININE, SERUM - Abnormal; Notable for the following:    Creatinine, Ser 1.01 (*)    GFR calc non Af Amer 58 (*)    All other components within normal limits  LACTIC ACID, PLASMA - Abnormal; Notable for the following:    Lactic Acid, Venous 5.2 (*)    All other components within normal limits  BLOOD GAS, ARTERIAL - Abnormal; Notable for the following:    pH, Arterial 7.239 (*)    pCO2 arterial  63.5 (*)    All other components within normal limits  GLUCOSE, CAPILLARY - Abnormal; Notable for the following:    Glucose-Capillary 303 (*)    All other components within normal limits  I-STAT CG4 LACTIC ACID,  ED - Abnormal; Notable for the following:    Lactic Acid, Venous 3.03 (*)    All other components within normal limits  MRSA PCR SCREENING  CULTURE, BLOOD (ROUTINE X 2)  CULTURE, BLOOD (ROUTINE X 2)  CULTURE, BLOOD (ROUTINE X 2)  CULTURE, BLOOD (ROUTINE X 2)  URINE CULTURE  RESPIRATORY PANEL BY PCR  PROCALCITONIN  MAGNESIUM  PHOSPHORUS  TROPONIN I  LACTIC ACID, PLASMA  TROPONIN I  TROPONIN I  STREP PNEUMONIAE URINARY ANTIGEN  LEGIONELLA PNEUMOPHILA SEROGP 1 UR AG  LACTIC ACID, PLASMA  PROCALCITONIN  CBC  BASIC METABOLIC PANEL  BLOOD GAS, ARTERIAL  MAGNESIUM  BLOOD GAS, ARTERIAL  LACTIC ACID, PLASMA  I-STAT CG4 LACTIC ACID, ED  I-STAT TROPOININ, ED    EKG  EKG Interpretation  Date/Time:  Wednesday September 28 2016 08:41:12 EDT Ventricular Rate:  138 PR Interval:    QRS Duration: 101 QT Interval:  288 QTC Calculation: 437 R Axis:   110 Text Interpretation:  Sinus tachycardia Ventricular premature complex Aberrant conduction of SV complex(es) Low voltage with right axis deviation Sinus Tach.  No STEMI Confirmed by [facility] MD, Anaisa Radi [phone]) on 09/28/2016 6:54:33 PM       Radiology Dg Chest Port 1 View  Result Date: 09/28/2016 CLINICAL DATA:  Increased shortness of breath, wheezing and decreased oxygen saturation this morning, history COPD, diabetes mellitus, asthma, dementia EXAM: PORTABLE CHEST 1 VIEW COMPARISON:  Portable exam 0858 hours compared to 10/20/2015 FINDINGS: Normal heart size, mediastinal contours, and pulmonary vascularity. Atherosclerotic calcification aorta. Emphysematous changes with new bibasilar opacities suspicious for pneumonia. No pleural effusion or pneumothorax. Bones diffusely demineralized IMPRESSION: COPD changes with new  bibasilar opacities question pneumonia. Electronically Signed   By: Lavonia Dana M.D.   On: 09/28/2016 09:12    Procedures Procedures (including critical care time)  CRITICAL CARE Performed by: Gwenyth Allegra Giavanni Zeitlin Total critical care time: 60 minutes Critical care time was exclusive of separately billable procedures and treating other patients. Critical care was necessary to treat or prevent imminent or life-threatening deterioration. Critical care was time spent personally by me on the following activities: development of treatment plan with patient and/or surrogate as well as nursing, discussions with consultants, evaluation of patient's response to treatment, examination of patient, obtaining history from patient or surrogate, ordering and performing treatments and interventions, ordering and review of laboratory studies, ordering and review of radiographic studies, pulse oximetry and re-evaluation of patient's condition.  Medications Ordered in ED Medications  azithromycin (ZITHROMAX) 500 mg in dextrose 5 % 250 mL IVPB (not administered)  trimethoprim-polymyxin b (POLYTRIM) ophthalmic solution 1 drop (1 drop Both Eyes Given 09/28/16 1749)  insulin aspart (novoLOG) injection 0-15 Units (11 Units Subcutaneous Given 09/28/16 1723)  heparin injection 5,000 Units (not administered)  lactated ringers infusion ( Intravenous New Bag/Given 09/28/16 1724)  ipratropium-albuterol (DUONEB) 0.5-2.5 (3) MG/3ML nebulizer solution 3 mL (3 mLs Nebulization Not Given 09/28/16 1545)  methylPREDNISolone sodium succinate (SOLU-MEDROL) 40 mg/mL injection 40 mg (40 mg Intravenous Given 09/28/16 1821)  LORazepam (ATIVAN) injection 0.5 mg (not administered)  vancomycin (VANCOCIN) IVPB 1000 mg/200 mL premix (1,000 mg Intravenous Given 09/28/16 1819)  cefTAZidime (FORTAZ) 2 g in dextrose 5 % 50 mL IVPB (2 g Intravenous Given 09/28/16 1726)  vancomycin (VANCOCIN) 500 mg in sodium chloride 0.9 % 100 mL IVPB (not  administered)  polyvinyl alcohol (LIQUIFILM TEARS) 1.4 % ophthalmic solution 1 drop (not administered)  sodium chloride 0.9 % bolus 500 mL (500 mLs Intravenous Given 09/28/16 1855)  oseltamivir (TAMIFLU) capsule 30 mg (not administered)  sodium chloride 0.9 % bolus 1,000 mL (0 mLs Intravenous Stopped 09/28/16 1018)    And  sodium chloride 0.9 % bolus 250 mL (0 mLs Intravenous Stopped 09/28/16 1152)  azithromycin (ZITHROMAX) 500 mg in dextrose 5 % 250 mL IVPB (0 mg Intravenous Stopped 09/28/16 1152)  acetaminophen (TYLENOL) tablet 650 mg (650 mg Oral Given 09/28/16 0934)  cefTRIAXone (ROCEPHIN) 2 g in dextrose 5 % 50 mL IVPB (0 g Intravenous Stopped 09/28/16 1000)  sodium chloride 0.9 % bolus 1,000 mL (1,000 mLs Intravenous New Bag/Given 09/28/16 1246)     Initial Impression / Assessment and Plan / ED Course  I have reviewed the triage vital signs and the nursing notes.  Pertinent labs & imaging results that were available during my care of the patient were reviewed by me and considered in my medical decision making (see chart for details).     Traci Thomas is a 66 y.o. female with a past medical history significant for diabetes, asthma, and COPD who presents with respiratory distress.  History and exam are seen above. Next  On exam, patient has severe wheezing in all lung fields. Patient also had crackles on exam. Patient's chest and abdomen were nontender. No lower extremity edema appreciated. No focal neurologic deficits.  Patient quickly made a code sepsis after she was found to be tachycardic, tachypnea, febrile, and hypoxic. With the history of cough, and restore distress, suspect a pulmonary source. Patient will be given antibiotics for suspected to be associated pneumonia. Patient had continuous albuterol ordered as well as cultures and blood work. Anticipate admission.  Imaging showed concern for pneumonia.  Diagnostic lab testing showed lactic acid rising. White blood cell count  elevated. Patient given more fluids.  Given rising lactic acid, continuous nebulizer necessity, and BiPAP use, critical care team called for admission. Patient admitted to critical-care service in the ICU for further management of pneumonia with sepsis.     Final Clinical Impressions(s) / ED Diagnoses   Final diagnoses:  Sepsis due to pneumonia ([place])  COPD exacerbation ([place])  Hypoxia    Clinical Impression: 1. Sepsis due to pneumonia ([place])   2. Pneumonia   3. COPD exacerbation ([place])   4. Hypoxia     Disposition: Admit to ICU with critical care team    Courtney Paris, MD 09/28/16 1859

## 2016-09-28 NOTE — Progress Notes (Signed)
Pharmacy Antibiotic Note  Traci Thomas is a 66 y.o. female admitted on 09/28/2016 with pneumonia.  Pharmacy has been consulted for vancomycin and ceftazadime dosing. Patient  From SNF with chronic respiratory failure related to COPD.  She presents with fatigue, confusion, dry cough, Shortness of breath.  Noted to have fever in ED.   Today, 09/28/2016  Renal: SCr WNL  WBC elevated  Fever in ED  LA elevated  Plan:  Vancomycin 1gm IV x 1 then 500mg  IV q12h  Check trough if remains on vanco for >= 72h  Ceftazidime 2gm IV q8h  Monitor renal function - SCr should be monitored q48h  If not ordered, suggest MRSA PCR to assist with de-escalation  PCCM wishes to continue azithromycin for now  Height: 4\' 11"  (149.9 cm) Weight: 140 lb (63.5 kg) IBW/kg (Calculated) : 43.2  Temp (24hrs), Avg:102.5 F (39.2 C), Min:102.5 F (39.2 C), Max:102.5 F (39.2 C)   Recent Labs Lab 09/28/16 0855 09/28/16 0859 09/28/16 1210  WBC 16.1*  --   --   CREATININE 0.90  --   --   LATICACIDVEN  --  1.85 3.03*    Estimated Creatinine Clearance: 51.1 mL/min (by C-G formula based on SCr of 0.9 mg/dL).    Allergies  Allergen Reactions  . Adhesive [Tape] Other (See Comments)    Skin peels off   . Ciprofloxacin     Unknown reaction   . Latex     Unknown reaction   . Prednisone Nausea And Vomiting    Antimicrobials this admission: 4/11 >> ceftriaxone x 1 4/11 >> vancomycin  >> 4/11 >> ceftazidime >> 4/11 >> azithromycin  >>  Dose adjustments this admission:  Microbiology results: 4/11 UCx: 4/11 BCx: 4/11 Legionella Ur Ag 4/11 S. Pneumoniae Ur Ag:   Thank you for allowing pharmacy to be a part of this patient's care.  Doreene Eland, PharmD, BCPS.   Pager: [phone] 09/28/2016 4:40 PM

## 2016-09-28 NOTE — Progress Notes (Signed)
Scanned by RN, Administered by RT

## 2016-09-28 NOTE — ED Notes (Signed)
Family:  [name]:  [phone]

## 2016-09-28 NOTE — Progress Notes (Signed)
CRITICAL VALUE ALERT  Critical value received:  Lactic acid 5.2  Date of notification:  09/28/16  Time of notification:  3202  Critical value read back:Yes.    Nurse who received alert:  Katherine Mantle RN  MD notified (1st page):  Hervey Ard  Time of first page:  [phone]  MD notified (2nd page):  Time of second page:  Responding MD:  Hervey Ard  Time MD responded:  [phone]

## 2016-09-28 NOTE — Progress Notes (Signed)
PHARMACY NOTE -  Rocephin & Azithromycin for CAP  Pharmacy has been assisting with dosing of abx for CAP. Dosage remains stable at Rocephin 1gm q24, Azithromycin 500mg  q24 and need for further dosage adjustment appears unlikely at present.    Will sign off at this time.  Please reconsult if a change in clinical status warrants re-evaluation of dosage.  Minda Ditto PharmD Pager [phone] 09/28/2016, 9:01 AM

## 2016-09-28 NOTE — ED Notes (Signed)
Bed: WA17 Expected date: 09/28/16 Expected time: 8:32 AM Means of arrival: Ambulance Comments: Resp diff, septic

## 2016-09-28 NOTE — Progress Notes (Signed)
eLink Physician-Brief Progress Note Patient Name: Traci Thomas DOB: 06/12/1951 MRN: [id number]   Date of Service  09/28/2016  HPI/Events of Note  Clinically alert, appearing well Int changes on pcxr Lactic up,   eICU Interventions  resp panel pending Consider empiric tamiflu with rise in flu B and her status regardeless of snf recent Bolus further an dincrease fluids rate     Intervention Category Intermediate Interventions: Diagnostic test evaluation  Raylene Miyamoto. 09/28/2016, 6:38 PM

## 2016-09-28 NOTE — H&P (Signed)
PULMONARY / CRITICAL CARE MEDICINE   Name: Traci Thomas MRN: [id number] DOB: 08-18-1950    ADMISSION DATE:  09/28/2016 CONSULTATION DATE:  4/11  REFERRING MD:  tegeler   CHIEF COMPLAINT:  Acute on chronic respiratory failure   HISTORY OF PRESENT ILLNESS:   This is a 66 year old female w/ chronic resp failure in setting of COPD (on theophylline and oxygen at baseline). We last saw her back in amy 2017 as a pre-op pulmonary consult after she fractured her right hip. She now resides at a SNF. Presented to [facility] w/ 5-7 d h/o increased fatigue, confusion, increased dry NP cough and worsening shortness of breath. On arrival to ER temp 102.5 HR 139. Room air sats 76%, CXR showed bibasilar airspace disease and initial lactic acid was 1.8. She was placed on NIPPV, administered nebs, solumedrol and abx (azith and roceph). IVFs were infused. Her lactic acid raised to 3.03 in-spite of 37ml/kg bolus so fluid challenge was repeated. PCCM asked to admit   PAST MEDICAL HISTORY :  She  has a past medical history of Asthma; Back pain; COPD (chronic obstructive pulmonary disease) ([place]); and Diabetes mellitus without complication ([place]).  PAST SURGICAL HISTORY: She  has a past surgical history that includes Appendectomy; Tonsillectomy; and Femur IM nail (Right, 10/21/2015).  Allergies  Allergen Reactions  . Adhesive [Tape] Other (See Comments)    Skin peels off   . Ciprofloxacin     Unknown reaction   . Latex     Unknown reaction   . Prednisone Nausea And Vomiting    No current facility-administered medications on file prior to encounter.    Current Outpatient Prescriptions on File Prior to Encounter  Medication Sig  . albuterol (PROVENTIL HFA;VENTOLIN HFA) 108 (90 BASE) MCG/ACT inhaler Inhale 2 puffs into the lungs every 6 (six) hours as needed for wheezing or shortness of breath.  . ALPRAZolam (XANAX) 0.5 MG tablet Take 1 tablet (0.5 mg total) by mouth 2 (two) times daily as needed for anxiety.  (Patient taking differently: Take 0.5 mg by mouth every 6 (six) hours. )  . desvenlafaxine (PRISTIQ) 100 MG 24 hr tablet Take 100 mg by mouth daily with breakfast.   . HYDROcodone-acetaminophen (NORCO/VICODIN) 5-325 MG tablet Take 1-2 tablets by mouth every 6 (six) hours as needed for moderate pain.  Marland Kitchen prochlorperazine (COMPAZINE) 10 MG tablet Take 10 mg by mouth every 4 (four) hours as needed for nausea or vomiting.  . senna-docusate (SENOKOT-S) 8.6-50 MG tablet Take 2 tablets by mouth 2 (two) times daily.  . theophylline (THEODUR) 300 MG 12 hr tablet Take 300 mg by mouth 2 (two) times daily.  Marland Kitchen tiotropium (SPIRIVA HANDIHALER) 18 MCG inhalation capsule Place 1 capsule (18 mcg total) into inhaler and inhale daily.  . Vitamin D, Ergocalciferol, (DRISDOL) 50000 units CAPS capsule Take 50,000 Units by mouth every Sunday.     FAMILY HISTORY:  Her family hx is unknown  SOCIAL HISTORY: She  reports that she has been smoking.  She has been smoking about 1.00 pack per day. She has never used smokeless tobacco. She reports that she drinks alcohol. She reports that she does not use drugs.  REVIEW OF SYSTEMS:   Not able d/t HOH AND confusion   SUBJECTIVE:  Comfortable on NIPPV but scared   VITAL SIGNS: BP 138/72 (BP Location: Right Arm)   Pulse (!) 111   Temp (!) 102.5 F (39.2 C) (Oral)   Resp 19   Ht 4\' 11"  (1.499  m)   Wt 140 lb (63.5 kg)   SpO2 94%   BMI 28.28 kg/m   HEMODYNAMICS:    VENTILATOR SETTINGS: FiO2 (%):  [55 %] 55 %  INTAKE / OUTPUT: No intake/output data recorded.  General appearance:  66 Year old emale, well nourished confused, but conversant  Eyes: anicteric sclerae, moist conjunctivae; PERRL, EOMI bilaterally. Mouth:  membranes and no mucosal ulcerations; normal hard and soft palate, BIPAP mask in place  Neck: Trachea midline; neck supple, no JVD Lungs/chest: increased respiratory effort no intercostal retractions, bibasilar rales R>L. Tight w/ prolonged exhale  and faint wheeze  CV: RRR, no MRGs  Abdomen: Soft, non-tender; no masses or HSM Extremities: trace peripheral edema or extremity lymphadenopathy Skin: Normal temperature, turgor and texture; no rash, ulcers or subcutaneous nodules Neuro/Psych: anxious affect, alert and oriented to person only  LABS:  BMET  Recent Labs Lab 09/28/16 0855  NA 140  K 4.4  CL 101  CO2 29  BUN 23*  CREATININE 0.90  GLUCOSE 231*    Electrolytes  Recent Labs Lab 09/28/16 0855  CALCIUM 9.2    CBC  Recent Labs Lab 09/28/16 0855  WBC 16.1*  HGB 11.6*  HCT 37.5  PLT 278    Coag's No results for input(s): APTT, INR in the last 168 hours.  Sepsis Markers  Recent Labs Lab 09/28/16 0859 09/28/16 1210  LATICACIDVEN 1.85 3.03*    ABG No results for input(s): PHART, PCO2ART, PO2ART in the last 168 hours.  Liver Enzymes  Recent Labs Lab 09/28/16 0855  AST 16  ALT 9*  ALKPHOS 131*  BILITOT 0.5  ALBUMIN 4.1    Cardiac Enzymes No results for input(s): TROPONINI, PROBNP in the last 168 hours.  Glucose No results for input(s): GLUCAP in the last 168 hours.  Imaging Dg Chest Port 1 View  Result Date: 09/28/2016 CLINICAL DATA:  Increased shortness of breath, wheezing and decreased oxygen saturation this morning, history COPD, diabetes mellitus, asthma, dementia EXAM: PORTABLE CHEST 1 VIEW COMPARISON:  Portable exam 0858 hours compared to 10/20/2015 FINDINGS: Normal heart size, mediastinal contours, and pulmonary vascularity. Atherosclerotic calcification aorta. Emphysematous changes with new bibasilar opacities suspicious for pneumonia. No pleural effusion or pneumothorax. Bones diffusely demineralized IMPRESSION: COPD changes with new bibasilar opacities question pneumonia. Electronically Signed   By: Lavonia Dana M.D.   On: 09/28/2016 09:12  PCXR w/ bibasilar airspace disease    STUDIES:    CULTURES: resp viral panel 4/11>> UC 4/11>>> BCX2 4/11>>> U strep 4/11>>> U  legionella 4/11>>>  ANTIBIOTICS: vanc 4/11>>> fortaz 4/11>>> azith 4/11>>>  SIGNIFICANT EVENTS:   DISCUSSION:   ASSESSMENT / PLAN:  Acute on chronic Hypoxic respiratory failure in the setting of HCAP and AECOPD Plan Admit to ICU BIPAP w/ close obs for intubation Scheduled BDs Systemic steroids HCAP coverage w/ vanc,fortaz and cont azith day 0/x PCT algo  Respiratory viral culture   Severe sepsis  Plan  Cont IVFs Repeat lactic acid BC X2, UC, resp viral panel  abx per above   Lactic acidosis.  Now s/p another bolus after already getting her 30 ml/kg. I suspect some of this was work of breathing related.  Plan Repeat LA Cont IVFs  Acute encephalopathy  -likely mix of sepsis and hypercarbia Plan Decrease sedating meds (not she is on long term benzos so will need dose reduction but not d/c) f/u culture data Supportive care   Anemia of chronic disease Plan Trend cbc [place] heparin   DM w/ hyperglycemia  Plan ssi   DVT prophylaxis: [place] heparin  SUP: PPI Diet: meds  Activity: bedrest  Disposition : ICU limited code  Discussion  Admitting to ICU Dx Acute on chronic resp failure in setting of HCAP and AECOPD.  Would accept short term intubation  Hope we can sneak by w/ BIPAP abx/steroids/nebs/IVFs.  Limited code   Erick Colace ACNP-BC [place] Pager # [phone] OR # [phone] if no answer 09/28/2016, 3:14 PM  Attending Note:  I have examined patient, reviewed labs, studies and notes. I have discussed the case with Jerrye Bushy, and I agree with the data and plans as amended above.   66 yo chronically ill woman with obesity, severe COPD with chronic hypoxemic resp failure. She experienced approximately 5 days of progressive symptoms - dyspnea, fatigue, confusion, non-productive cough. She was brought for evaluation and noted be febrile to 102.5, confused, desaturated on room air. She is supposed to wear oxygen at all times at  baseline, sounds like she is compliant except for sometimes at night. Chest x-ray in the emergency department was suggestive of bilateral basilar interstitial infiltrates. She was started on noninvasive positive pressure ventilation given her hypoxemia, respiratory distress, and an evolving metabolic acidosis. IV fluids, corticosteroids, bronchodilators and empiric antibiotics were started for presumed healthcare associated pneumonia. On my evaluation she is on nasal cannula oxygen, taking a break from BiPAP. She is desaturated to 86-87%. She has moderate increased work of breathing but is able to carry on a conversation. She is awake, has significant decreased hearing, moves all extremities. Her lungs have fair air movement with bilateral expiratory wheezes in all fields. Heart sounds are difficult to hear, regular, tachycardic. No significant lower extremity edema. We will continue to support her with BiPAP, try to give her breaks intermittently. I believe she needs to wear it is consistently as possible over the next 12 hours while we treat her bronchospasm. Antibiotics will be broadened to cover healthcare associated pneumonia. Corticosteroids and bronchodilators were ordered. CODE STATUS has been discussed with the patient's son. The patient has advanced directives and would not want CPR, long-term indefinite support (trach, PEG). He is willing to consider short-term intubation to allow her to survive an acute reversible process. Hopefully we will be able to avoid this with current therapies.  Independent critical care time is 40 minutes.   Baltazar Apo, MD, PhD 09/28/2016, 4:07 PM [facility] [phone] or if no answer [phone]

## 2016-09-28 NOTE — Progress Notes (Signed)
eLink Physician-Brief Progress Note Patient Name: ANYSA TACEY DOB: 1951-04-13 MRN: [id number]   Date of Service  09/28/2016  HPI/Events of Note  HCAP NIMV, COPD Awake ALert Code sepsis was called Camera shows TV 400 rr 22-26 , awake, getting foley inserted now NO ABG noted Trop neg  eICU Interventions  Will Repeat lactic, has risen slight and fluids code sepsis were continued ABG on NIMV with Tv 400 and rate and lactic increase NOted dnr, but ETT is okay     Intervention Category Evaluation Type: New Patient Evaluation  Raylene Miyamoto. 09/28/2016, 4:54 PM

## 2016-09-28 NOTE — Progress Notes (Signed)
Rt increase settings from 12/6 50% to 16/8 40% due to ABG.

## 2016-09-28 NOTE — Progress Notes (Signed)
eLink Physician-Brief Progress Note Patient Name: ZAKYRIA METZINGER DOB: 06/19/1951 MRN: [id number]   Date of Service  09/28/2016  HPI/Events of Note  abg noted and down to 40%, so O2 improved but ph down, from resp acidosios  eICU Interventions  EPAP can sat same IPAP needs increase abg in 2 hours     Intervention Category Major Interventions: Hypoxemia - evaluation and management  Raylene Miyamoto. 09/28/2016, 5:38 PM

## 2016-09-28 NOTE — Progress Notes (Signed)
eLink Physician-Brief Progress Note Patient Name: Traci Thomas DOB: [birth year] MRN: [id number]   Date of Service  09/28/2016  HPI/Events of Note  fent Xray abdo Constipation?  eICU Interventions       Intervention Category Intermediate Interventions: Abdominal pain - evaluation and management  [name] J. 09/28/2016, 8:20 PM

## 2016-09-28 NOTE — Progress Notes (Signed)
PHARMACY NOTE:  ANTIMICROBIAL RENAL DOSAGE ADJUSTMENT  Current antimicrobial regimen includes a mismatch between antimicrobial dosage and estimated renal function.  As per policy approved by the Pharmacy & Therapeutics and Medical Executive Committees, the antimicrobial dosage will be adjusted accordingly.  Current antimicrobial dosage:  Tamiflu 75mg  PO BID Indication: Influenza  Renal Function:  Estimated Creatinine Clearance: 44.9 mL/min (A) (by C-G formula based on SCr of 1.01 mg/dL (H)). []      On intermittent HD, scheduled: []      On CRRT    Antimicrobial dosage has been changed to:  Tamiflu 30mg  PO BID for CrCl 30-14ml/min  Additional comments:   Thank you for allowing pharmacy to be a part of this patient's care.  Biagio Borg, [facility] 09/28/2016 6:44 PM

## 2016-09-28 NOTE — ED Notes (Signed)
Elevated Lactic reported to Eber Hong and MD Tegeler

## 2016-09-28 NOTE — ED Triage Notes (Signed)
Per EMS, pt from [facility]. Pt in respiratory distress.  Pt hypoxic on arrival.  Initial sat 76% on RA.  Initially placed on 4L without much improvement.  Pt placed on 15L NRB   Albuterol given.  Wheezing.  Pt placed on CPAP and sats improved to 90-93%%.  15mg  Albuterol total.  1mg  Atrovent.  125mg  Solumedrol.  2g Magnesium.  Pt has COPD.  DNR at bedside.  IV LAC, 18g.  Vitals: temp 103, hr 140, 138/68,

## 2016-09-28 NOTE — ED Notes (Signed)
Attempted pt to void.  Unable at this time.  Pt reminded of sample need.

## 2016-09-29 ENCOUNTER — Inpatient Hospital Stay (HOSPITAL_COMMUNITY): Payer: Medicare Other

## 2016-09-29 DIAGNOSIS — R6520 Severe sepsis without septic shock: Secondary | ICD-10-CM

## 2016-09-29 DIAGNOSIS — G9340 Encephalopathy, unspecified: Secondary | ICD-10-CM

## 2016-09-29 DIAGNOSIS — A419 Sepsis, unspecified organism: Principal | ICD-10-CM

## 2016-09-29 LAB — RESPIRATORY PANEL BY PCR
Adenovirus: NOT DETECTED
BORDETELLA PERTUSSIS-RVPCR: NOT DETECTED
CHLAMYDOPHILA PNEUMONIAE-RVPPCR: NOT DETECTED
CORONAVIRUS 229E-RVPPCR: NOT DETECTED
CORONAVIRUS HKU1-RVPPCR: NOT DETECTED
Coronavirus NL63: NOT DETECTED
Coronavirus OC43: NOT DETECTED
INFLUENZA B-RVPPCR: NOT DETECTED
Influenza A: NOT DETECTED
METAPNEUMOVIRUS-RVPPCR: NOT DETECTED
Mycoplasma pneumoniae: NOT DETECTED
Parainfluenza Virus 1: NOT DETECTED
Parainfluenza Virus 2: NOT DETECTED
Parainfluenza Virus 3: NOT DETECTED
Parainfluenza Virus 4: NOT DETECTED
RESPIRATORY SYNCYTIAL VIRUS-RVPPCR: NOT DETECTED
Rhinovirus / Enterovirus: NOT DETECTED

## 2016-09-29 LAB — BLOOD GAS, ARTERIAL
ACID-BASE EXCESS: 2.5 mmol/L — AB (ref 0.0–2.0)
BICARBONATE: 28.5 mmol/L — AB (ref 20.0–28.0)
Delivery systems: POSITIVE
Drawn by: 422461
EXPIRATORY PAP: 6
FIO2: 40
INSPIRATORY PAP: 16
LHR: 12 {breaths}/min
Mode: POSITIVE
O2 Saturation: 93.9 %
Patient temperature: 98.6
pCO2 arterial: 54.6 mmHg — ABNORMAL HIGH (ref 32.0–48.0)
pH, Arterial: 7.338 — ABNORMAL LOW (ref 7.350–7.450)
pO2, Arterial: 67.8 mmHg — ABNORMAL LOW (ref 83.0–108.0)

## 2016-09-29 LAB — GLUCOSE, CAPILLARY
GLUCOSE-CAPILLARY: 94 mg/dL (ref 65–99)
Glucose-Capillary: 165 mg/dL — ABNORMAL HIGH (ref 65–99)
Glucose-Capillary: 155 mg/dL — ABNORMAL HIGH (ref 65–99)
Glucose-Capillary: 143 mg/dL — ABNORMAL HIGH (ref 65–99)
Glucose-Capillary: 133 mg/dL — ABNORMAL HIGH (ref 65–99)

## 2016-09-29 LAB — BASIC METABOLIC PANEL
ANION GAP: 7 (ref 5–15)
BUN: 18 mg/dL (ref 6–20)
CALCIUM: 8.5 mg/dL — AB (ref 8.9–10.3)
CHLORIDE: 107 mmol/L (ref 101–111)
CO2: 27 mmol/L (ref 22–32)
CREATININE: 0.66 mg/dL (ref 0.44–1.00)
GFR calc non Af Amer: >60 mL/min (ref >60)
Glucose, Bld: 161 mg/dL — ABNORMAL HIGH (ref 65–99)
Potassium: 4.6 mmol/L (ref 3.5–5.1)
SODIUM: 141 mmol/L (ref 135–145)

## 2016-09-29 LAB — CBC
HCT: 33 % — ABNORMAL LOW (ref 36.0–46.0)
HEMOGLOBIN: 10.1 g/dL — AB (ref 12.0–15.0)
MCH: 28.4 pg (ref 26.0–34.0)
MCHC: 30.6 g/dL (ref 30.0–36.0)
MCV: 92.7 fL (ref 78.0–100.0)
Platelets: 239 10*3/uL (ref 150–400)
RBC: 3.56 MIL/uL — ABNORMAL LOW (ref 3.87–5.11)
RDW: 16.1 % — AB (ref 11.5–15.5)
WBC: 19.6 10*3/uL — AB (ref 4.0–10.5)

## 2016-09-29 LAB — PROCALCITONIN: Procalcitonin: 37.32 ng/mL

## 2016-09-29 LAB — MAGNESIUM: Magnesium: 2.1 mg/dL (ref 1.7–2.4)

## 2016-09-29 LAB — LACTIC ACID, PLASMA: Lactic Acid, Venous: 1 mmol/L (ref 0.5–1.9)

## 2016-09-29 LAB — TROPONIN I

## 2016-09-29 MED ORDER — INSULIN ASPART 100 UNIT/ML ~~LOC~~ SOLN
0.0000 [IU] | Freq: Every day | SUBCUTANEOUS | Status: DC
  Administered 2016-09-30 – 2016-10-13 (×4): 2 [IU] via SUBCUTANEOUS
  Administered 2016-10-14: 3 [IU] via SUBCUTANEOUS
  Administered 2016-10-15: 2 [IU] via SUBCUTANEOUS

## 2016-09-29 MED ORDER — INSULIN ASPART 100 UNIT/ML ~~LOC~~ SOLN
0.0000 [IU] | Freq: Three times a day (TID) | SUBCUTANEOUS | Status: DC
  Administered 2016-09-29 (×2): 2 [IU] via SUBCUTANEOUS
  Administered 2016-09-30: 3 [IU] via SUBCUTANEOUS
  Administered 2016-09-30: 1 [IU] via SUBCUTANEOUS
  Administered 2016-09-30: 3 [IU] via SUBCUTANEOUS
  Administered 2016-10-01: 2 [IU] via SUBCUTANEOUS
  Administered 2016-10-01 – 2016-10-02 (×3): 5 [IU] via SUBCUTANEOUS
  Administered 2016-10-02 (×2): 2 [IU] via SUBCUTANEOUS
  Administered 2016-10-03: 15 [IU] via SUBCUTANEOUS
  Administered 2016-10-03: 2 [IU] via SUBCUTANEOUS
  Administered 2016-10-04 (×3): 3 [IU] via SUBCUTANEOUS
  Administered 2016-10-05: 2 [IU] via SUBCUTANEOUS
  Administered 2016-10-05: 8 [IU] via SUBCUTANEOUS
  Administered 2016-10-05: 2 [IU] via SUBCUTANEOUS
  Administered 2016-10-06: 5 [IU] via SUBCUTANEOUS
  Administered 2016-10-06: 8 [IU] via SUBCUTANEOUS
  Administered 2016-10-06: 3 [IU] via SUBCUTANEOUS
  Administered 2016-10-07: 5 [IU] via SUBCUTANEOUS
  Administered 2016-10-07: 3 [IU] via SUBCUTANEOUS
  Administered 2016-10-07 – 2016-10-08 (×2): 5 [IU] via SUBCUTANEOUS
  Administered 2016-10-08: 11 [IU] via SUBCUTANEOUS
  Administered 2016-10-08 – 2016-10-09 (×3): 3 [IU] via SUBCUTANEOUS
  Administered 2016-10-09: 8 [IU] via SUBCUTANEOUS
  Administered 2016-10-10: 3 [IU] via SUBCUTANEOUS
  Administered 2016-10-10: 5 [IU] via SUBCUTANEOUS
  Administered 2016-10-10 – 2016-10-11 (×2): 2 [IU] via SUBCUTANEOUS
  Administered 2016-10-11: 3 [IU] via SUBCUTANEOUS
  Administered 2016-10-11: 15 [IU] via SUBCUTANEOUS
  Administered 2016-10-12: 11 [IU] via SUBCUTANEOUS
  Administered 2016-10-12: 5 [IU] via SUBCUTANEOUS
  Administered 2016-10-13: 15 [IU] via SUBCUTANEOUS
  Administered 2016-10-13 – 2016-10-14 (×2): 3 [IU] via SUBCUTANEOUS
  Administered 2016-10-14 – 2016-10-15 (×2): 5 [IU] via SUBCUTANEOUS
  Administered 2016-10-15 – 2016-10-16 (×2): 15 [IU] via SUBCUTANEOUS
  Administered 2016-10-16 (×2): 3 [IU] via SUBCUTANEOUS
  Administered 2016-10-17: 15 [IU] via SUBCUTANEOUS
  Administered 2016-10-17: 3 [IU] via SUBCUTANEOUS

## 2016-09-29 MED ORDER — PREDNISONE 20 MG PO TABS
40.0000 mg | ORAL_TABLET | Freq: Every day | ORAL | Status: DC
  Administered 2016-09-30 – 2016-10-04 (×5): 40 mg via ORAL
  Filled 2016-09-29 (×5): qty 2

## 2016-09-29 MED ORDER — AMLODIPINE BESYLATE 10 MG PO TABS
10.0000 mg | ORAL_TABLET | Freq: Every day | ORAL | Status: DC
  Administered 2016-09-29 – 2016-10-08 (×10): 10 mg via ORAL
  Filled 2016-09-29 (×10): qty 1

## 2016-09-29 MED ORDER — IPRATROPIUM-ALBUTEROL 0.5-2.5 (3) MG/3ML IN SOLN
3.0000 mL | Freq: Four times a day (QID) | RESPIRATORY_TRACT | Status: DC
  Administered 2016-09-29 – 2016-10-06 (×26): 3 mL via RESPIRATORY_TRACT
  Filled 2016-09-29 (×30): qty 3

## 2016-09-29 MED ORDER — ALPRAZOLAM 0.5 MG PO TABS
0.5000 mg | ORAL_TABLET | Freq: Three times a day (TID) | ORAL | Status: DC | PRN
  Administered 2016-09-29 – 2016-10-17 (×33): 0.5 mg via ORAL
  Filled 2016-09-29 (×36): qty 1

## 2016-09-29 MED ORDER — OXYBUTYNIN CHLORIDE ER 5 MG PO TB24
5.0000 mg | ORAL_TABLET | Freq: Every day | ORAL | Status: DC
  Administered 2016-09-30 – 2016-10-17 (×18): 5 mg via ORAL
  Filled 2016-09-29 (×18): qty 1

## 2016-09-29 MED ORDER — QUETIAPINE FUMARATE 25 MG PO TABS
50.0000 mg | ORAL_TABLET | Freq: Every day | ORAL | Status: DC
  Administered 2016-09-30 – 2016-10-17 (×18): 50 mg via ORAL
  Filled 2016-09-29: qty 1
  Filled 2016-09-29 (×2): qty 2
  Filled 2016-09-29 (×2): qty 1
  Filled 2016-09-29 (×9): qty 2
  Filled 2016-09-29: qty 1
  Filled 2016-09-29 (×3): qty 2

## 2016-09-29 MED ORDER — THEOPHYLLINE ER 300 MG PO TB12
300.0000 mg | ORAL_TABLET | Freq: Two times a day (BID) | ORAL | Status: DC
  Administered 2016-09-29 – 2016-10-17 (×37): 300 mg via ORAL
  Filled 2016-09-29 (×37): qty 1

## 2016-09-29 MED ORDER — QUETIAPINE FUMARATE 100 MG PO TABS
300.0000 mg | ORAL_TABLET | Freq: Every day | ORAL | Status: DC
  Administered 2016-09-29 – 2016-10-16 (×18): 300 mg via ORAL
  Filled 2016-09-29 (×18): qty 3

## 2016-09-29 MED ORDER — ALBUTEROL SULFATE (2.5 MG/3ML) 0.083% IN NEBU
2.5000 mg | INHALATION_SOLUTION | RESPIRATORY_TRACT | Status: DC | PRN
  Administered 2016-10-02 – 2016-10-03 (×2): 2.5 mg via RESPIRATORY_TRACT
  Filled 2016-09-29 (×2): qty 3

## 2016-09-29 MED ORDER — GABAPENTIN 100 MG PO CAPS
200.0000 mg | ORAL_CAPSULE | Freq: Four times a day (QID) | ORAL | Status: DC
  Administered 2016-09-29 – 2016-10-17 (×69): 200 mg via ORAL
  Filled 2016-09-29 (×70): qty 2

## 2016-09-29 MED ORDER — VENLAFAXINE HCL ER 150 MG PO CP24
150.0000 mg | ORAL_CAPSULE | Freq: Every day | ORAL | Status: DC
  Administered 2016-09-29 – 2016-10-17 (×19): 150 mg via ORAL
  Filled 2016-09-29 (×20): qty 1

## 2016-09-29 NOTE — Clinical Social Work Note (Addendum)
Clinical Social Work Assessment  Patient Details  Name: Traci Thomas MRN: [id number] Date of Birth: 03/12/1951  Date of referral:  09/29/16               Reason for consult:  Facility Placement                Permission sought to share information with:  Facility Art therapist granted to share information::  Yes, Verbal Permission Granted  Name::        Agency::     Relationship::     Contact Information:     Housing/Transportation Living arrangements for the past 2 months:  [place] (Pt was a long-term permanent resident, per the son.) Source of Information:  Adult Children Patient Interpreter Needed:  None Criminal Activity/Legal Involvement Pertinent to Current Situation/Hospitalization:    Significant Relationships:  Adult Children Lives with:  Facility Resident [employer]) Do you feel safe going back to the place where you live?  Yes Need for family participation in patient care:  Yes (Comment)  Care giving concerns:  Pt's son Traci Thomas: [phone] states he must be present or involved in a medical/placement decisions.   Social Worker assessment / plan:  CSW spoke with pt's son Traci Thomas and confirmed pt's son's plan to be discharged back to [facility] to live at discharge.  CSW provided active listening and validated pt's son's concerns.  Pt has been living at [facility] as a long-term resident prior to being admitted to [facility].  Employment status:  Retired Forensic scientist:  Medicare PT Recommendations:  Not assessed at this time Information / Referral to community resources:     Patient/Family's Response to care:  Patient was not alert and oriented at the time of the assessment, per MD notes and per son  Patient's son agreeable to plan to return to [facility].  Pt's son supportive and strongly involved in pt.'s care.  Pt's son gave CSW Dept verbal permission to create a FL-2 and send out  referrals to other facilities should [facility] not be able to assist with short-term rehab, before returning to [facility] for long-term residency.  Pt.'s son pleasant and appreciated CSW intervention.  Pt has been at [facility] since May of 2017.  Patient/Family's Understanding of and Emotional Response to Diagnosis, Current Treatment, and Prognosis:  Still assessing  Emotional Assessment Appearance:  Appears stated age Attitude/Demeanor/Rapport:  Unable to Assess Affect (typically observed):  Unable to Assess Orientation:   (Pt presents with confusion per MD, per pt's son) Alcohol / Substance use:    Psych involvement (Current and /or in the community):     Discharge Needs  Concerns to be addressed:  No discharge needs identified Readmission within the last 30 days:  No Current discharge risk:  None Barriers to Discharge:  No Barriers Identified   [name], LCSWA 09/29/2016, 3:23 PM

## 2016-09-29 NOTE — Care Management Note (Signed)
Case Management Note  Patient Details  Name: Traci Thomas MRN: [id number] Date of Birth: [birth year]  Subjective/Objective:                 Dyspnea and resp. Failure requiring bipap   Action/Plan: Home  Expected Discharge Date:   (UNKNOWN)               Expected Discharge Plan:  Home/Self Care  In-House Referral:     Discharge planning Services   following for needs  Post Acute Care Choice:    Choice offered to:     DME Arranged:    DME Agency:     HH Arranged:    HH Agency:     Status of Service:  In process, will continue to follow  If discussed at Long Length of Stay Meetings, dates discussed:    Additional Comments:  Leeroy Cha, RN 09/29/2016, 10:37 AM

## 2016-09-29 NOTE — Progress Notes (Signed)
Pt currently on [place] at this time and resting.

## 2016-09-29 NOTE — Progress Notes (Signed)
Pt off BIPAP when RT went to give neb tx.

## 2016-09-29 NOTE — Progress Notes (Addendum)
PULMONARY / CRITICAL CARE MEDICINE   Name: Traci Thomas MRN: [id number] DOB: [birth year]    ADMISSION DATE:  09/28/2016 CONSULTATION DATE:  4/11  REFERRING MD:  tegeler   CHIEF COMPLAINT:  Acute on chronic respiratory failure   HISTORY OF PRESENT ILLNESS:   This is a 66 year old female w/ chronic resp failure in setting of COPD (on theophylline and oxygen at baseline). We last saw her back in amy 2017 as a pre-op pulmonary consult after she fractured her right hip. She now resides at a SNF. Presented to [facility] w/ 5-7 d h/o increased fatigue, confusion, increased dry NP cough and worsening shortness of breath. On arrival to ER temp 102.5 HR 139. Room air sats 76%, CXR showed bibasilar airspace disease and initial lactic acid was 1.8. She was placed on NIPPV, administered nebs, solumedrol and abx (azith and roceph). IVFs were infused. Her lactic acid raised to 3.03 in-spite of 32ml/kg bolus so fluid challenge was repeated. PCCM asked to admit   SUBJECTIVE:  She wore BiPAP overnight More comfortable respiratory pattern the sporting, currently on nasal cannula Perseverates some on her hearing aids  VITAL SIGNS: BP (!) 148/93   Pulse (!) 107   Temp 98 F (36.7 C) (Axillary)   Resp 19   Ht 4\' 10"  (1.473 m)   Wt 66.6 kg (146 lb 13.2 oz)   SpO2 94%   BMI 30.69 kg/m   HEMODYNAMICS:    VENTILATOR SETTINGS: FiO2 (%):  [40 %-55 %] 40 %  INTAKE / OUTPUT: I/O last 3 completed shifts: In: 3419.2 [I.V.:1469.2; IV Piggyback:1950] Out: 1175 [Urine:1175]  General appearance:  Chronically ill-appearing, comfortable respiratory pattern, no distress, pleasantly confused Eyes: No icterus, pupils equal and reactive Mouth:  Edentulous, no oral lesions Neck: No stridor, no JVD Lungs/chest: Comfortable breathing pattern, better air movement, continued bilateral expiratory wheezes CV: Regular, borderline tachycardic, no murmur Abdomen: Soft, benign, positive bowel sounds Extremities: Trace  bilateral pretibial edema Skin: Warm, no rash Neuro/Psych: Awake, interacting, appears to be confused. Difficult to discern how much of this is her very poor hearing. Her son indicates that this is not her usual baseline mental status  LABS:  BMET  Recent Labs Lab 09/28/16 0855 09/28/16 1756 09/29/16 0331  NA 140  --  141  K 4.4  --  4.6  CL 101  --  107  CO2 29  --  27  BUN 23*  --  18  CREATININE 0.90 1.01* 0.66  GLUCOSE 231*  --  161*    Electrolytes  Recent Labs Lab 09/28/16 0855 09/28/16 1756 09/29/16 0331  CALCIUM 9.2  --  8.5*  MG  --  2.0 2.1  PHOS  --  2.7  --     CBC  Recent Labs Lab 09/28/16 0855 09/28/16 1756 09/29/16 0331  WBC 16.1* 21.1* 19.6*  HGB 11.6* 10.7* 10.1*  HCT 37.5 35.1* 33.0*  PLT 278 257 239    Coag's No results for input(s): APTT, INR in the last 168 hours.  Sepsis Markers  Recent Labs Lab 09/28/16 1210 09/28/16 1756 09/28/16 1757 09/29/16 0331  LATICACIDVEN 3.03*  --  5.2* 1.0  PROCALCITON  --  31.48  --  37.32    ABG  Recent Labs Lab 09/28/16 1700 09/28/16 1922 09/29/16 0418  PHART 7.239* 7.302* 7.338*  PCO2ART 63.5* 55.3* 54.6*  PO2ART 91.8 77.3* 67.8*    Liver Enzymes  Recent Labs Lab 09/28/16 0855  AST 16  ALT 9*  ALKPHOS  131*  BILITOT 0.5  ALBUMIN 4.1    Cardiac Enzymes  Recent Labs Lab 09/28/16 1756 09/28/16 2140 09/29/16 0331  TROPONINI <0.03 <0.03 <0.03    Glucose  Recent Labs Lab 09/28/16 1712 09/28/16 1920 09/28/16 2314 09/29/16 0324 09/29/16 0745  GLUCAP 303* 287* 123* 165* 155*    Imaging Dg Abd 1 View  Result Date: 09/28/2016 CLINICAL DATA:  Abdominal pain and diarrhea. Abdominal distention for few days. EXAM: ABDOMEN - 1 VIEW COMPARISON:  Pelvic radiograph Oct 21, 2015 FINDINGS: Moderate retained large bowel stool. Bowel gas pattern is nondilated and nonobstructive. No intra-abdominal mass effect or pathologic calcification. Phleboliths LEFT pelvis. RIGHT femur  ORIF. Soft tissue planes and included osseous structures are nonsuspicious. Osteopenia. IMPRESSION: Moderate large bowel stool, normal bowel gas pattern. Electronically Signed   By: Elon Alas M.D.   On: 09/28/2016 21:11   Dg Chest Port 1 View  Result Date: 09/29/2016 CLINICAL DATA:  Followup pneumonia EXAM: PORTABLE CHEST 1 VIEW COMPARISON:  Or 04/2017 FINDINGS: Heart size remains normal. Chronic aortic atherosclerosis. Background chronic fibrosis pattern with superimposed density in both lower lobes consistent with bronchopneumonia. No dense consolidation or lobar collapse. No measurable effusion. No new finding. IMPRESSION: Chronic lung disease with fibrotic pattern. Increased markings at the bases consistent with active lower lobe bronchopneumonia, similar in radiographic appearance to yesterday. Electronically Signed   By: Nelson Chimes M.D.   On: 09/29/2016 06:59  PCXR w/ bibasilar airspace disease    STUDIES:    CULTURES: resp viral panel 4/11>> UC 4/11>>> BCX2 4/11>>> U strep 4/11>>> U legionella 4/11>>>  ANTIBIOTICS: vanc 4/11>>> fortaz 4/11>>> azith 4/11>>>  SIGNIFICANT EVENTS:   DISCUSSION:   ASSESSMENT / PLAN:  Acute on chronic Hypoxic respiratory failure HCAP AECOPD Plan Improved, change BiPAP to when necessary Continue scheduled bronchodilators We will attempt to wean her systemic steroids given her confusion Continue current antibiotics, narrow when culture data available; HCAP coverage w/ vanc,fortaz and cont azith day 2/x Consider restart home theophylline Continue empiric Tamiflu pending respiratory viral panel results  Severe sepsis, improved Plan  KVO IV fluids Follow culture data Antibiotics as detailed above  Hypertension Plan Start amlodipine 10mg  qd (new med)  Lactic acidosis, peaked at 5.2. Now resolved Plan KVO IV fluids  Abdominal pain, distention. Suspect aerophagia on BiPAP +/- constipation Plan Lactulose until BM, then  d/c  Acute encephalopathy  Likely some degree of baseline dementia Chronic anxiety Plan Minimize sedating medications as able. Continue current Ativan dosing (note on chronic benzos at home). Consider restart her home Xanax Restart home Seroquel, Neurontin; substituted Effexor for her home dose of Pristiq Reorient frequently Address underlying causes  Anemia of chronic disease Plan Subcutaneous heparin Follow CBC  DM w/ hyperglycemia Plan Sliding scale insulin as ordered  Hx urinary retention Plan Ditropan ordered 4/12   DVT prophylaxis: [place] heparin  SUP: PPI Diet: start clears 4/12 Activity: OOB  Disposition :  Changed stepdown status on 4/12  Discussion   Independent critical care time is 35 minutes.   Baltazar Apo, MD, PhD 09/29/2016, 9:15 AM [facility] [phone] or if no answer [phone]

## 2016-09-29 NOTE — Progress Notes (Signed)
Rt took pt off BIPAP and placed on 6 LPM [place] pre home regimen.

## 2016-09-29 NOTE — Progress Notes (Signed)
Pt placed back on BIPAP by RN.

## 2016-09-29 NOTE — Progress Notes (Signed)
Rt had to place pt back on  BIPAP due to low sats.

## 2016-09-30 DIAGNOSIS — J9621 Acute and chronic respiratory failure with hypoxia: Secondary | ICD-10-CM

## 2016-09-30 DIAGNOSIS — J9622 Acute and chronic respiratory failure with hypercapnia: Secondary | ICD-10-CM

## 2016-09-30 LAB — LEGIONELLA PNEUMOPHILA SEROGP 1 UR AG: L. PNEUMOPHILA SEROGP 1 UR AG: NEGATIVE

## 2016-09-30 LAB — GLUCOSE, CAPILLARY
GLUCOSE-CAPILLARY: 123 mg/dL — AB (ref 65–99)
GLUCOSE-CAPILLARY: 127 mg/dL — AB (ref 65–99)
Glucose-Capillary: 172 mg/dL — ABNORMAL HIGH (ref 65–99)
Glucose-Capillary: 188 mg/dL — ABNORMAL HIGH (ref 65–99)

## 2016-09-30 LAB — URINE CULTURE: Culture: NO GROWTH

## 2016-09-30 LAB — PROCALCITONIN: Procalcitonin: 25.19 ng/mL

## 2016-09-30 NOTE — Progress Notes (Addendum)
PULMONARY / CRITICAL CARE MEDICINE   Name: Traci Thomas MRN: [id number] DOB: 1951-03-26    ADMISSION DATE:  09/28/2016 CONSULTATION DATE:  4/11  REFERRING MD:  tegeler   CHIEF COMPLAINT:  Acute on chronic respiratory failure   HISTORY OF PRESENT ILLNESS:   This is a 66 year old female w/ chronic resp failure in setting of COPD (on theophylline and oxygen at baseline). We last saw her back in amy 2017 as a pre-op pulmonary consult after she fractured her right hip. She now resides at a SNF. Presented to [facility] w/ 5-7 d h/o increased fatigue, confusion, increased dry NP cough and worsening shortness of breath. On arrival to ER temp 102.5 HR 139. Room air sats 76%, CXR showed bibasilar airspace disease and initial lactic acid was 1.8. She was placed on NIPPV, administered nebs, solumedrol and abx (azith and roceph). IVFs were infused. Her lactic acid raised to 3.03 in-spite of 27ml/kg bolus so fluid challenge was repeated. PCCM asked to admit   SUBJECTIVE:  Has been using BiPAP reliably for about the last 24 hours Feels that her respiratory pattern is more comfortable, acknowledges that she is benefiting from the BiPAP No distress  VITAL SIGNS: BP (!) 145/54 (BP Location: Right Arm)   Pulse (!) 123   Temp 98.6 F (37 C) (Oral)   Resp 20   Ht 4\' 10"  (1.473 m)   Wt 65.4 kg (144 lb 2.9 oz)   SpO2 (!) 89%   BMI 30.13 kg/m   HEMODYNAMICS:    VENTILATOR SETTINGS: FiO2 (%):  [40 %-50 %] 50 %  INTAKE / OUTPUT: I/O last 3 completed shifts: In: 2469 [I.V.:2069; IV Piggyback:400] Out: 4436 [Urine:4435; Stool:1]  General appearance:  Chronically ill-appearing woman, comfortable at this time, interacting more appropriately Eyes: No icterus, pupils equal Mouth:  No oral lesions, edentulous Neck: No stridor, no JVD Lungs/chest: Comfortable respiratory pattern, continues to have diffuse bilateral expiratory wheezes that are likely approaching baseline. CV: Regular, no murmur Abdomen:  Soft, nontender, positive bowel sounds Extremities: Trace bilateral pretibial edema Skin: Warm, no rash Neuro/Psych: Wakes easily to voice, better oriented today. Follows commands.  LABS:  BMET  Recent Labs Lab 09/28/16 0855 09/28/16 1756 09/29/16 0331  NA 140  --  141  K 4.4  --  4.6  CL 101  --  107  CO2 29  --  27  BUN 23*  --  18  CREATININE 0.90 1.01* 0.66  GLUCOSE 231*  --  161*    Electrolytes  Recent Labs Lab 09/28/16 0855 09/28/16 1756 09/29/16 0331  CALCIUM 9.2  --  8.5*  MG  --  2.0 2.1  PHOS  --  2.7  --     CBC  Recent Labs Lab 09/28/16 0855 09/28/16 1756 09/29/16 0331  WBC 16.1* 21.1* 19.6*  HGB 11.6* 10.7* 10.1*  HCT 37.5 35.1* 33.0*  PLT 278 257 239    Coag's No results for input(s): APTT, INR in the last 168 hours.  Sepsis Markers  Recent Labs Lab 09/28/16 1210 09/28/16 1756 09/28/16 1757 09/29/16 0331 09/30/16 0321  LATICACIDVEN 3.03*  --  5.2* 1.0  --   PROCALCITON  --  31.48  --  37.32 25.19    ABG  Recent Labs Lab 09/28/16 1700 09/28/16 1922 09/29/16 0418  PHART 7.239* 7.302* 7.338*  PCO2ART 63.5* 55.3* 54.6*  PO2ART 91.8 77.3* 67.8*    Liver Enzymes  Recent Labs Lab 09/28/16 0855  AST 16  ALT 9*  ALKPHOS 131*  BILITOT 0.5  ALBUMIN 4.1    Cardiac Enzymes  Recent Labs Lab 09/28/16 1756 09/28/16 2140 09/29/16 0331  TROPONINI <0.03 <0.03 <0.03    Glucose  Recent Labs Lab 09/29/16 0324 09/29/16 0745 09/29/16 1110 09/29/16 1604 09/29/16 2123 09/30/16 0821  GLUCAP 165* 155* 143* 133* 94 123*    Imaging No results found.PCXR w/ bibasilar airspace disease    STUDIES:    CULTURES: resp viral panel 4/11>> Negative UC 4/11>>> negative BCX2 4/11>>> U strep 4/11>>> Negative U legionella 4/11>>>  ANTIBIOTICS: vanc 4/11>>> 4/13 fortaz 4/11>>> azith 4/11>>>  SIGNIFICANT EVENTS:   DISCUSSION:   ASSESSMENT / PLAN:  Acute on chronic Hypoxic respiratory  failure HCAP AECOPD Plan Slowly improving. Try to give her a break from BiPAP now, changed to when necessary Continue current support scheduled bronchodilators Continue current systemic steroids, wean when able given her delirium early in the course Narrow antibiotics, continue azithromycin and ceftaz, DC vancomycin on 4/13 and follow clinically Stop Tamiflu Continue home theophylline  Severe sepsis, improved Plan  IV fluids KVO Follow culture data Antibiotics as above  Hypertension Plan Started amlodipine on 4/12 (new med)  Lactic acidosis, peaked at 5.2. Now resolved Plan Follow clinically, BMP IV fluids KVO  Abdominal pain, distention. Suspect aerophagia on BiPAP +/- constipation Plan Lactulose for constipation, then discontinue  Acute encephalopathy  Likely some degree of baseline dementia Chronic anxiety Plan Mental status improving. Suspect this reflects improvement in her sepsis, respiratory failure. Continue to minimize sedating medications Continue current Xanax dosing, note on chronic benzos at home Ativan when necessary Continue Seroquel, Neurontin Effexor substituted for her home Pristiq Reorient frequently  Anemia of chronic disease Plan Subcutaneous heparin Follow CBC   DM w/ hyperglycemia Plan Sliding-scale insulin as ordered  Hx urinary retention Plan Continue ditropan   DVT prophylaxis: [place] heparin  SUP: PPI Diet: start clears 4/12 Activity: OOB  Disposition :  Changed stepdown status on 4/12    Independent critical care time is 33 minutes  Baltazar Apo, MD, PhD 09/30/2016, 11:54 AM [place] [phone] or if no answer [phone]

## 2016-09-30 NOTE — Progress Notes (Addendum)
Pharmacy Antibiotic Note  Traci Thomas is a 66 y.o. female admitted on 09/28/2016 with pneumonia.  Pharmacy has been consulted for vancomycin and ceftazadime dosing. Patient  From SNF with chronic respiratory failure related to COPD.  She presents with fatigue, confusion, dry cough, Shortness of breath.  Noted to have fever in ED.   Today, 09/30/2016  Day #3 antibiotics  Renal: SCr WNL per 4/12  WBC elevated per 4/12  No fevers > 48h  LA to WNL  Procalcitonin eleavted but improving  Plan:  Vancomycin  500mg  IV q12h  Check trough in am if remains on vancomycin   Consider stopping vancomycin with neg MRSA PCR swab and BCx NGTD  Respiratory panel unrevealing, consider stop tamiflu  Ceftazidime 2gm IV q8h  Monitor renal function - SCr should be monitored q48h  PCCM wishes to continue azithromycin  Height: 4\' 10"  (147.3 cm) Weight: 144 lb 2.9 oz (65.4 kg) IBW/kg (Calculated) : 40.9  Temp (24hrs), Avg:98.3 F (36.8 C), Min:97.8 F (36.6 C), Max:99 F (37.2 C)   Recent Labs Lab 09/28/16 0855 09/28/16 0859 09/28/16 1210 09/28/16 1756 09/28/16 1757 09/29/16 0331  WBC 16.1*  --   --  21.1*  --  19.6*  CREATININE 0.90  --   --  1.01*  --  0.66  LATICACIDVEN  --  1.85 3.03*  --  5.2* 1.0    Estimated Creatinine Clearance: 56.9 mL/min (by C-G formula based on SCr of 0.66 mg/dL).    Allergies  Allergen Reactions  . Adhesive [Tape] Other (See Comments)    Skin peels off   . Ciprofloxacin     Unknown reaction   . Latex     Unknown reaction   . Prednisone Nausea And Vomiting    Antimicrobials this admission: 4/11 >> ceftriaxone x 1 4/11 >> azithromycin  >> 4/11 >> vancomycin  >> 4/11 >> ceftazidime >> 4/11 >> Tamiflu >> (4/16)   Dose adjustments this admission:   Microbiology results: 4/11 UCx: NG 4/11 [id number] 4/11 BCx (after azith/ctx given): NGTD 4/11 Legionella Ur Ag 4/11 S. Pneumoniae Ur Ag: neg 4/11 MRSA PCR: neg 4/11 resp panel: neg  Thank you  for allowing pharmacy to be a part of this patient's care.  Doreene Eland, PharmD, BCPS.   Pager: [phone] 09/30/2016 10:05 AM

## 2016-10-01 LAB — GLUCOSE, CAPILLARY
GLUCOSE-CAPILLARY: 205 mg/dL — AB (ref 65–99)
GLUCOSE-CAPILLARY: 208 mg/dL — AB (ref 65–99)
Glucose-Capillary: 134 mg/dL — ABNORMAL HIGH (ref 65–99)
Glucose-Capillary: 139 mg/dL — ABNORMAL HIGH (ref 65–99)

## 2016-10-01 NOTE — Progress Notes (Signed)
PULMONARY / CRITICAL CARE MEDICINE   Name: Traci Thomas MRN: [id number] DOB: [birth year]    ADMISSION DATE:  09/28/2016 CONSULTATION DATE:  4/11  REFERRING MD:  tegeler   CHIEF COMPLAINT:  Acute on chronic respiratory failure   HISTORY OF PRESENT ILLNESS:   This is a 66 year old female w/ chronic resp failure in setting of COPD (on theophylline and oxygen at baseline). We last saw her back in amy 2017 as a pre-op pulmonary consult after she fractured her right hip. She now resides at a SNF. Presented to [facility] w/ 5-7 d h/o increased fatigue, confusion, increased dry NP cough and worsening shortness of breath. On arrival to ER temp 102.5 HR 139. Room air sats 76%, CXR showed bibasilar airspace disease and initial lactic acid was 1.8. She was placed on NIPPV, administered nebs, solumedrol and abx (azith and roceph). IVFs were infused. Her lactic acid raised to 3.03 in-spite of 76ml/kg bolus so fluid challenge was repeated. PCCM asked to admit   SUBJECTIVE:  Off BiPAP. Transitioned to high flow nasal cannula  VITAL SIGNS: BP (!) 123/104   Pulse (!) 106   Temp 98.5 F (36.9 C) (Oral)   Resp 20   Ht 4\' 10"  (1.473 m)   Wt 136 lb 7.4 oz (61.9 kg)   SpO2 90%   BMI 28.52 kg/m   HEMODYNAMICS:    VENTILATOR SETTINGS: FiO2 (%):  [55 %] 55 %  INTAKE / OUTPUT: I/O last 3 completed shifts: In: 1150 [I.V.:230; Other:120; IV Piggyback:800] Out: 4385 [Urine:4385]  Gen:      Mild distress, able to converse in full sentences HEENT:  EOMI, sclera anicteric Neck:     No masses; no thyromegaly Lungs:    Bilateral expiratory wheezes, coarse rhonchit CV:         Tachycardic, regular no murmurs Abd:      + bowel sounds; soft, non-tender; no palpable masses, no distension Ext:    No edema; adequate peripheral perfusion Skin:      Warm and dry; no rash Neuro: alert and oriented x 3   LABS:  BMET  Recent Labs Lab 09/28/16 0855 09/28/16 1756 09/29/16 0331  NA 140  --  141  K 4.4  --  4.6   CL 101  --  107  CO2 29  --  27  BUN 23*  --  18  CREATININE 0.90 1.01* 0.66  GLUCOSE 231*  --  161*    Electrolytes  Recent Labs Lab 09/28/16 0855 09/28/16 1756 09/29/16 0331  CALCIUM 9.2  --  8.5*  MG  --  2.0 2.1  PHOS  --  2.7  --     CBC  Recent Labs Lab 09/28/16 0855 09/28/16 1756 09/29/16 0331  WBC 16.1* 21.1* 19.6*  HGB 11.6* 10.7* 10.1*  HCT 37.5 35.1* 33.0*  PLT 278 257 239    Coag's No results for input(s): APTT, INR in the last 168 hours.  Sepsis Markers  Recent Labs Lab 09/28/16 1210 09/28/16 1756 09/28/16 1757 09/29/16 0331 09/30/16 0321  LATICACIDVEN 3.03*  --  5.2* 1.0  --   PROCALCITON  --  31.48  --  37.32 25.19    ABG  Recent Labs Lab 09/28/16 1700 09/28/16 1922 09/29/16 0418  PHART 7.239* 7.302* 7.338*  PCO2ART 63.5* 55.3* 54.6*  PO2ART 91.8 77.3* 67.8*    Liver Enzymes  Recent Labs Lab 09/28/16 0855  AST 16  ALT 9*  ALKPHOS 131*  BILITOT 0.5  ALBUMIN  4.1    Cardiac Enzymes  Recent Labs Lab 09/28/16 1756 09/28/16 2140 09/29/16 0331  TROPONINI <0.03 <0.03 <0.03    Glucose  Recent Labs Lab 09/29/16 2123 09/30/16 0821 09/30/16 1210 09/30/16 1619 09/30/16 2207 10/01/16 0819  GLUCAP 94 123* 172* 188* 127* 134*    Imaging No results found.  Chest x-ray 4/11- hyperinflation, bibasilar opacities Chest x-ray 4/12-persistent bibasilar opacities I have reviewed all images personally  STUDIES:   CULTURES: resp viral panel 4/11>> Negative UC 4/11>>> negative BCX2 4/11>>> U strep 4/11>>> Negative U legionella 4/11>>>  ANTIBIOTICS: vanc 4/11>>> 4/13 Tamiflu 4/11 >>> 4/13 fortaz 4/11>>> azith 4/11>>>  SIGNIFICANT EVENTS:  DISCUSSION: 66 year old with advanced COPD admitted with acute on chronic respiratory failure secondary to age, COPD exacerbation  ASSESSMENT / PLAN: Acute on chronic Hypoxic respiratory failure HCAP AECOPD Plan Continue high flow nasal cannula. Use BiPAP as needed  during the day and 90 3 at night Continue scheduled bronchodilators Prednisone. Keep at current dose 40 mg Continue theophylline  Severe sepsis, improved\ Lactic acidosis, peaked at 5.2. Now resolved Plan  Antibiotics as above  Hypertension Plan Continue amlodipine  Abdominal pain, distention. Suspect aerophagia on BiPAP +/- constipation Plan Bowel regimen. Stop lactulose PO feeds  Acute encephalopathy  Likely some degree of baseline dementia Chronic anxiety Plan Improving mental status Continue benzos when necessary for anxiety Continue Neurontin, effexor, seroquel  Anemia of chronic disease Plan SQ heparin Follow CBC   DM w/ hyperglycemia Plan SSI coverage  Hx urinary retention Plan Continue ditropan  DVT prophylaxis: SQ heparin  SUP: Non needed Diet: PO diet Activity: OOB  Disposition :  Changed stepdown status on 4/12  Marshell Garfinkel MD [place] [facility] Pager [phone] If no answer or after 3pm call: [phone] 10/01/2016, 11:43 AM

## 2016-10-02 DIAGNOSIS — J9600 Acute respiratory failure, unspecified whether with hypoxia or hypercapnia: Secondary | ICD-10-CM

## 2016-10-02 LAB — BASIC METABOLIC PANEL
ANION GAP: 9 (ref 5–15)
BUN: 13 mg/dL (ref 6–20)
CO2: 32 mmol/L (ref 22–32)
Calcium: 8.8 mg/dL — ABNORMAL LOW (ref 8.9–10.3)
Chloride: 100 mmol/L — ABNORMAL LOW (ref 101–111)
Creatinine, Ser: 0.8 mg/dL (ref 0.44–1.00)
GLUCOSE: 124 mg/dL — AB (ref 65–99)
POTASSIUM: 3.5 mmol/L (ref 3.5–5.1)
Sodium: 141 mmol/L (ref 135–145)

## 2016-10-02 LAB — CBC
HEMATOCRIT: 34.2 % — AB (ref 36.0–46.0)
Hemoglobin: 10.8 g/dL — ABNORMAL LOW (ref 12.0–15.0)
MCH: 28.3 pg (ref 26.0–34.0)
MCHC: 31.6 g/dL (ref 30.0–36.0)
MCV: 89.5 fL (ref 78.0–100.0)
PLATELETS: 257 10*3/uL (ref 150–400)
RBC: 3.82 MIL/uL — AB (ref 3.87–5.11)
RDW: 15.6 % — ABNORMAL HIGH (ref 11.5–15.5)
WBC: 10.2 10*3/uL (ref 4.0–10.5)

## 2016-10-02 LAB — GLUCOSE, CAPILLARY
GLUCOSE-CAPILLARY: 217 mg/dL — AB (ref 65–99)
Glucose-Capillary: 132 mg/dL — ABNORMAL HIGH (ref 65–99)
Glucose-Capillary: 130 mg/dL — ABNORMAL HIGH (ref 65–99)
Glucose-Capillary: 178 mg/dL — ABNORMAL HIGH (ref 65–99)

## 2016-10-02 MED ORDER — HYDROCODONE-ACETAMINOPHEN 5-325 MG PO TABS
1.0000 | ORAL_TABLET | Freq: Four times a day (QID) | ORAL | Status: DC | PRN
  Administered 2016-10-02 – 2016-10-17 (×42): 1 via ORAL
  Filled 2016-10-02 (×45): qty 1

## 2016-10-02 NOTE — Progress Notes (Signed)
PULMONARY / CRITICAL CARE MEDICINE   Name: Traci Thomas MRN: [id number] DOB: Apr 15, 1951    ADMISSION DATE:  09/28/2016 CONSULTATION DATE:  4/11  REFERRING MD:  tegeler   CHIEF COMPLAINT:  Acute on chronic respiratory failure   HISTORY OF PRESENT ILLNESS:   This is a 66 year old female w/ chronic resp failure in setting of COPD (on theophylline and oxygen at baseline). We last saw her back in amy 2017 as a pre-op pulmonary consult after she fractured her right hip. She now resides at a SNF. Presented to [facility] w/ 5-7 d h/o increased fatigue, confusion, increased dry NP cough and worsening shortness of breath. On arrival to ER temp 102.5 HR 139. Room air sats 76%, CXR showed bibasilar airspace disease and initial lactic acid was 1.8. She was placed on NIPPV, administered nebs, solumedrol and abx (azith and roceph). IVFs were infused. Her lactic acid raised to 3.03 in-spite of 8ml/kg bolus so fluid challenge was repeated. PCCM asked to admit   SUBJECTIVE:  Maintained on high flow nasal cannula. Did not need BiPAP Still dyspneic  VITAL SIGNS: BP 132/71   Pulse (!) 111   Temp 98.3 F (36.8 C) (Oral)   Resp (!) 22   Ht 4\' 10"  (1.473 m)   Wt 141 lb 5 oz (64.1 kg)   SpO2 90%   BMI 29.53 kg/m   HEMODYNAMICS:    VENTILATOR SETTINGS: FiO2 (%):  [50 %] 50 %  INTAKE / OUTPUT: I/O last 3 completed shifts: In: 3716 [I.V.:120; Other:720; IV Piggyback:250] Out: 1850 [Urine:1850]  Gen:      No distress HEENT:  EOMI, sclera anicteric Neck:     No masses; no thyromegaly Lungs:    Bilateral expiratory wheeze, coarse rhonchi CV:         Tachycardic, regular no murmurs Abd:      + bowel sounds; soft, non-tender; no palpable masses, no distension Ext:    No edema; adequate peripheral perfusion Skin:      Warm and dry; no rash Neuro: alert and oriented x 3  LABS:  BMET  Recent Labs Lab 09/28/16 0855 09/28/16 1756 09/29/16 0331 10/02/16 0349  NA 140  --  141 141  K 4.4  --  4.6  3.5  CL 101  --  107 100*  CO2 29  --  27 32  BUN 23*  --  18 13  CREATININE 0.90 1.01* 0.66 0.80  GLUCOSE 231*  --  161* 124*    Electrolytes  Recent Labs Lab 09/28/16 0855 09/28/16 1756 09/29/16 0331 10/02/16 0349  CALCIUM 9.2  --  8.5* 8.8*  MG  --  2.0 2.1  --   PHOS  --  2.7  --   --     CBC  Recent Labs Lab 09/28/16 1756 09/29/16 0331 10/02/16 0349  WBC 21.1* 19.6* 10.2  HGB 10.7* 10.1* 10.8*  HCT 35.1* 33.0* 34.2*  PLT 257 239 257    Coag's No results for input(s): APTT, INR in the last 168 hours.  Sepsis Markers  Recent Labs Lab 09/28/16 1210 09/28/16 1756 09/28/16 1757 09/29/16 0331 09/30/16 0321  LATICACIDVEN 3.03*  --  5.2* 1.0  --   PROCALCITON  --  31.48  --  37.32 25.19    ABG  Recent Labs Lab 09/28/16 1700 09/28/16 1922 09/29/16 0418  PHART 7.239* 7.302* 7.338*  PCO2ART 63.5* 55.3* 54.6*  PO2ART 91.8 77.3* 67.8*    Liver Enzymes  Recent Labs Lab 09/28/16  0855  AST 16  ALT 9*  ALKPHOS 131*  BILITOT 0.5  ALBUMIN 4.1    Cardiac Enzymes  Recent Labs Lab 09/28/16 1756 09/28/16 2140 09/29/16 0331  TROPONINI <0.03 <0.03 <0.03    Glucose  Recent Labs Lab 10/01/16 0819 10/01/16 1209 10/01/16 1504 10/01/16 2117 10/02/16 0756 10/02/16 1224  GLUCAP 134* 205* 208* 139* 132* 217*    Imaging No results found.  Chest x-ray 4/11- hyperinflation, bibasilar opacities Chest x-ray 4/12-persistent bibasilar opacities Previously reviewed by me  STUDIES:   CULTURES: resp viral panel 4/11>> Negative UC 4/11>>> negative BCX2 4/11>>> U strep 4/11>>> Negative U legionella 4/11>>>  ANTIBIOTICS: vanc 4/11>>> 4/13 Tamiflu 4/11 >>> 4/13 fortaz 4/11>>> azith 4/11>>>  SIGNIFICANT EVENTS:  DISCUSSION: 66 year old with advanced COPD admitted with acute on chronic respiratory failure secondary to age, COPD exacerbation  ASSESSMENT / PLAN: Acute on chronic Hypoxic respiratory failure HCAP AECOPD Plan -Continue  high flow nasal cannula -BiPAP as needed -Continue prednisone, bronchodilators, theophylline  Severe sepsis, improved\ Lactic acidosis, peaked at 5.2. Now resolved Plan   antibiotics as above  Hypertension Plan Continue amlodipine  Abdominal pain, distention. Suspect aerophagia on BiPAP +/- constipation Plan Bowel regimen PO feeds  Acute encephalopathy  Likely some degree of baseline dementia Chronic anxiety Plan Continue bezos prn for anxiety Continue neurontin, effexor, seroquel  Anemia of chronic disease Plan SQ heparin Follow CBC  DM w/ hyperglycemia Plan SSI coverage  Hx urinary retention Plan Continue ditropan  DVT prophylaxis: SQ heparin  SUP: Non needed Diet: PO diet Activity: OOB  Disposition :  Changed stepdown status on 4/12  Marshell Garfinkel MD [place] [facility] Pager [phone] If no answer or after 3pm call: [phone] 10/02/2016, 1:50 PM

## 2016-10-03 LAB — GLUCOSE, CAPILLARY
GLUCOSE-CAPILLARY: 384 mg/dL — AB (ref 65–99)
Glucose-Capillary: 120 mg/dL — ABNORMAL HIGH (ref 65–99)
Glucose-Capillary: 143 mg/dL — ABNORMAL HIGH (ref 65–99)
Glucose-Capillary: 116 mg/dL — ABNORMAL HIGH (ref 65–99)

## 2016-10-03 LAB — CULTURE, BLOOD (ROUTINE X 2)
CULTURE: NO GROWTH
CULTURE: NO GROWTH
CULTURE: NO GROWTH
Culture: NO GROWTH
SPECIAL REQUESTS: ADEQUATE
SPECIAL REQUESTS: ADEQUATE
Special Requests: ADEQUATE
Special Requests: ADEQUATE

## 2016-10-03 NOTE — Progress Notes (Signed)
Pharmacy Antibiotic Note  Traci Thomas is a 66 y.o. female admitted on 09/28/2016 with pneumonia.  Pharmacy has been consulted for vancomycin and ceftazadime dosing. Patient  From SNF with chronic respiratory failure related to COPD.  She presents with fatigue, confusion, dry cough, Shortness of breath.  Noted to have fever in ED.   Today, 10/03/2016  Day #6 antibiotics  CrCl stable at baseline  WBC normalized  No fevers > 48h  LA to WNL  Procalcitonin elevated but improved on 4/13, not rechecked  Plan:  Continue Ceftazidime 2gm IV q8h - will complete course tomorrow  Azithromycin per PCCM  Recommend stopping PolyTrim eyedrops; original course was intended as 4/5 - 4/11, but still receiving  Height: 4\' 10"  (147.3 cm) Weight: 139 lb 1.8 oz (63.1 kg) IBW/kg (Calculated) : 40.9  Temp (24hrs), Avg:98.5 F (36.9 C), Min:97.6 F (36.4 C), Max:99 F (37.2 C)   Recent Labs Lab 09/28/16 0855 09/28/16 0859 09/28/16 1210 09/28/16 1756 09/28/16 1757 09/29/16 0331 10/02/16 0349  WBC 16.1*  --   --  21.1*  --  19.6* 10.2  CREATININE 0.90  --   --  1.01*  --  0.66 0.80  LATICACIDVEN  --  1.85 3.03*  --  5.2* 1.0  --     Estimated Creatinine Clearance: 55.9 mL/min (by C-G formula based on SCr of 0.8 mg/dL).    Allergies  Allergen Reactions  . Adhesive [Tape] Other (See Comments)    Skin peels off   . Ciprofloxacin     Unknown reaction   . Latex     Unknown reaction   . Prednisone Nausea And Vomiting    Antimicrobials this admission: 4/11 >> ceftriaxone x 1 4/11 >> azithromycin >> 4/11 >> vancomycin >> 4/13 4/11 >> ceftazidime >> 4/11 >> Tamiflu >> 4/13 Polytrim eyedrops   Dose adjustments this admission:   Microbiology results: 4/11 UCx: NG 4/11 YME:BRAX 4/11 BCx (after azith/ctx given): NGTD 4/11 Legionella Ur Ag: neg 4/11 S. Pneumoniae Ur Ag: neg 4/11 MRSA PCR: neg 4/11 resp panel: neg   Thank you for allowing pharmacy to be a part of this patient's  care.  Reuel Boom, PharmD, BCPS Pager: [phone] 10/03/2016, 2:34 PM

## 2016-10-03 NOTE — Progress Notes (Signed)
Date:  October 03, 2016 Chart reviewed for concurrent status and case management needs. Will continue to follow patient progress. Discharge Planning: following for needs Expected discharge date: [id number] Salaam Battershell, BSN, RN3, CCM   [phone] 

## 2016-10-03 NOTE — Progress Notes (Signed)
PULMONARY / CRITICAL CARE MEDICINE   Name: Traci Thomas MRN: [id number] DOB: [birth year]    ADMISSION DATE:  09/28/2016 CONSULTATION DATE:  4/11  REFERRING MD:  tegeler   CHIEF COMPLAINT:  Acute on chronic respiratory failure   HISTORY OF PRESENT ILLNESS:   This is a 66 year old female w/ chronic resp failure in setting of COPD (on theophylline and oxygen at baseline). We last saw her back in amy 2017 as a pre-op pulmonary consult after she fractured her right hip. She now resides at a SNF. Presented to [facility] w/ 5-7 d h/o increased fatigue, confusion, increased dry NP cough and worsening shortness of breath. On arrival to ER temp 102.5 HR 139. Room air sats 76%, CXR showed bibasilar airspace disease and initial lactic acid was 1.8. She was placed on NIPPV, administered nebs, solumedrol and abx (azith and roceph). IVFs were infused. Her lactic acid raised to 3.03 in-spite of 53ml/kg bolus so fluid challenge was repeated. PCCM asked to admit   SUBJECTIVE:  Still on HFNC Got out of bed today  VITAL SIGNS: BP (!) 155/72 (BP Location: Left Arm)   Pulse 94   Temp 98.8 F (37.1 C) (Oral)   Resp (!) 22   Ht 4\' 10"  (1.473 m)   Wt 139 lb 1.8 oz (63.1 kg)   SpO2 95%   BMI 29.07 kg/m   HEMODYNAMICS:    VENTILATOR SETTINGS: FiO2 (%):  [50 %] 50 %  INTAKE / OUTPUT: I/O last 3 completed shifts: In: 1870 [P.O.:400; I.V.:230; Other:640; IV Piggyback:600] Out: 1700 [Urine:1700]  Gen:      No acute distress HEENT:  EOMI, sclera anicteric Neck:     No masses; no thyromegaly Lungs:    B/L exp wheeze, improving; normal respiratory effort CV:         Regular rate and rhythm; no murmurs Abd:      + bowel sounds; soft, non-tender; no palpable masses, no distension Ext:    No edema; adequate peripheral perfusion Skin:      Warm and dry; no rash Neuro: alert and oriented x 3 Psych: normal mood and affect  LABS:  BMET  Recent Labs Lab 09/28/16 0855 09/28/16 1756 09/29/16 0331  10/02/16 0349  NA 140  --  141 141  K 4.4  --  4.6 3.5  CL 101  --  107 100*  CO2 29  --  27 32  BUN 23*  --  18 13  CREATININE 0.90 1.01* 0.66 0.80  GLUCOSE 231*  --  161* 124*    Electrolytes  Recent Labs Lab 09/28/16 0855 09/28/16 1756 09/29/16 0331 10/02/16 0349  CALCIUM 9.2  --  8.5* 8.8*  MG  --  2.0 2.1  --   PHOS  --  2.7  --   --     CBC  Recent Labs Lab 09/28/16 1756 09/29/16 0331 10/02/16 0349  WBC 21.1* 19.6* 10.2  HGB 10.7* 10.1* 10.8*  HCT 35.1* 33.0* 34.2*  PLT 257 239 257    Coag's No results for input(s): APTT, INR in the last 168 hours.  Sepsis Markers  Recent Labs Lab 09/28/16 1210 09/28/16 1756 09/28/16 1757 09/29/16 0331 09/30/16 0321  LATICACIDVEN 3.03*  --  5.2* 1.0  --   PROCALCITON  --  31.48  --  37.32 25.19    ABG  Recent Labs Lab 09/28/16 1700 09/28/16 1922 09/29/16 0418  PHART 7.239* 7.302* 7.338*  PCO2ART 63.5* 55.3* 54.6*  PO2ART 91.8 77.3*  67.8*    Liver Enzymes  Recent Labs Lab 09/28/16 0855  AST 16  ALT 9*  ALKPHOS 131*  BILITOT 0.5  ALBUMIN 4.1    Cardiac Enzymes  Recent Labs Lab 09/28/16 1756 09/28/16 2140 09/29/16 0331  TROPONINI <0.03 <0.03 <0.03    Glucose  Recent Labs Lab 10/01/16 2117 10/02/16 0756 10/02/16 1224 10/02/16 1614 10/02/16 2128 10/03/16 0805  GLUCAP 139* 132* 217* 130* 178* 120*    Imaging No results found.  Chest x-ray 4/11- hyperinflation, bibasilar opacities Chest x-ray 4/12-persistent bibasilar opacities Previously reviewed by me  STUDIES:   CULTURES: resp viral panel 4/11>> Negative UC 4/11>>> negative BCX2 4/11>>> U strep 4/11>>> Negative U legionella 4/11>>>  ANTIBIOTICS: vanc 4/11>>> 4/13 Tamiflu 4/11 >>> 4/13 fortaz 4/11>>> azith 4/11>>>  SIGNIFICANT EVENTS:  DISCUSSION: 66 year old with advanced COPD admitted with acute on chronic respiratory failure secondary to age, COPD exacerbation  ASSESSMENT / PLAN: Acute on chronic Hypoxic  respiratory failure HCAP AECOPD Plan - Continue high flow nasal cannula, wean down as tolerated  - Bipap as needed - Continue prednisone, nebs, theophylline  Severe sepsis, improved\ Lactic acidosis, peaked at 5.2. Now resolved Plan  Abx as above Finish 7 day course  Hypertension Plan Continue amlodipine  Abdominal pain, distention. Suspect aerophagia on BiPAP +/- constipation Plan PO feeds  Acute encephalopathy  Likely some degree of baseline dementia Chronic anxiety Plan Continue benzos prn for anxiety Continue neurontin, effexor, seroquel  Anemia of chronic disease Plan SQ heparin Follow CBC  DM w/ hyperglycemia Plan SSI coverage  Hx urinary retention Plan Continue ditropan  DVT prophylaxis: SQ heparin  SUP: Non needed Diet: PO diet Activity: OOB  Disposition :  Changed stepdown status on 4/12  Marshell Garfinkel MD [place] [facility] Pager [phone] If no answer or after 3pm call: [phone] 10/03/2016, 10:38 AM

## 2016-10-04 ENCOUNTER — Inpatient Hospital Stay (HOSPITAL_COMMUNITY): Payer: Medicare Other

## 2016-10-04 DIAGNOSIS — R109 Unspecified abdominal pain: Secondary | ICD-10-CM

## 2016-10-04 DIAGNOSIS — A419 Sepsis, unspecified organism: Secondary | ICD-10-CM

## 2016-10-04 DIAGNOSIS — R1030 Lower abdominal pain, unspecified: Secondary | ICD-10-CM

## 2016-10-04 DIAGNOSIS — G9341 Metabolic encephalopathy: Secondary | ICD-10-CM

## 2016-10-04 DIAGNOSIS — J181 Lobar pneumonia, unspecified organism: Secondary | ICD-10-CM

## 2016-10-04 LAB — BASIC METABOLIC PANEL
Anion gap: 7 (ref 5–15)
BUN: 18 mg/dL (ref 6–20)
CALCIUM: 9 mg/dL (ref 8.9–10.3)
CO2: 37 mmol/L — ABNORMAL HIGH (ref 22–32)
CREATININE: 0.92 mg/dL (ref 0.44–1.00)
Chloride: 96 mmol/L — ABNORMAL LOW (ref 101–111)
Glucose, Bld: 162 mg/dL — ABNORMAL HIGH (ref 65–99)
Potassium: 3.7 mmol/L (ref 3.5–5.1)
SODIUM: 140 mmol/L (ref 135–145)

## 2016-10-04 LAB — CBC
HCT: 34 % — ABNORMAL LOW (ref 36.0–46.0)
HEMOGLOBIN: 10.6 g/dL — AB (ref 12.0–15.0)
MCH: 28.2 pg (ref 26.0–34.0)
MCHC: 31.2 g/dL (ref 30.0–36.0)
MCV: 90.4 fL (ref 78.0–100.0)
PLATELETS: 265 10*3/uL (ref 150–400)
RBC: 3.76 MIL/uL — ABNORMAL LOW (ref 3.87–5.11)
RDW: 15.9 % — ABNORMAL HIGH (ref 11.5–15.5)
WBC: 10 10*3/uL (ref 4.0–10.5)

## 2016-10-04 LAB — GLUCOSE, CAPILLARY
GLUCOSE-CAPILLARY: 171 mg/dL — AB (ref 65–99)
Glucose-Capillary: 183 mg/dL — ABNORMAL HIGH (ref 65–99)
Glucose-Capillary: 187 mg/dL — ABNORMAL HIGH (ref 65–99)
Glucose-Capillary: 111 mg/dL — ABNORMAL HIGH (ref 65–99)

## 2016-10-04 MED ORDER — METHYLPREDNISOLONE SODIUM SUCC 125 MG IJ SOLR
60.0000 mg | Freq: Two times a day (BID) | INTRAMUSCULAR | Status: DC
  Administered 2016-10-04 – 2016-10-11 (×14): 60 mg via INTRAVENOUS
  Filled 2016-10-04 (×14): qty 2

## 2016-10-04 NOTE — NC FL2 (Addendum)
[place] LEVEL OF CARE SCREENING TOOL     IDENTIFICATION  Patient Name: Traci Thomas Birthdate: [birth year] Sex: female Admission Date (Current Location): 09/28/2016  [facility] and [place] Number:  Herbalist and Address:  [facility],  [place] [address] [place]      Provider Number: [id number]  Attending Physician Name and Address:  Orson Eva, MD  Relative Name and Phone Number:       Current Level of Care: Hospital Recommended Level of Care: [place] Prior Approval Number:    Date Approved/Denied:   PASRR Number: [id number]  Discharge Plan: SNF    Current Diagnoses: Patient Active Problem List   Diagnosis Date Noted  . Lobar pneumonia ([place]) 10/04/2016  . Acute metabolic encephalopathy 75/17/0017  . Abdominal pain   . Sepsis due to pneumonia ([place])   . Acute respiratory failure ([place]) 09/28/2016  . CAP (community acquired pneumonia) 10/21/2015  . Hip fracture ([place]) 10/21/2015  . Acute respiratory failure with hypoxia ([place]) 10/21/2015  . COPD (chronic obstructive pulmonary disease) ([place]) 10/21/2015  . Chronic anemia 10/21/2015  . Dementia 10/21/2015  . Preoperative respiratory examination 10/21/2015    Class: Acute  . Closed right hip fracture ([place])   . Encounter for palliative care   . COPD exacerbation ([place]) 03/25/2015  . Acute on chronic respiratory failure with hypoxia ([place]) 03/25/2015  . Somnolence 03/25/2015  . Acute delirium 03/25/2015  . Diabetes mellitus type 2 in nonobese ([place]) 03/25/2015  . Depression 03/25/2015    Orientation RESPIRATION BLADDER Height & Weight     Self, Time, Situation, Place  Nasal Cannula 4L Continent Weight: 132 lb 7.9 oz (60.1 kg) Height:  4\' 10"  (147.3 cm)  BEHAVIORAL SYMPTOMS/MOOD NEUROLOGICAL BOWEL NUTRITION STATUS      Continent Diet (See Discharge Summary)  AMBULATORY STATUS COMMUNICATION OF NEEDS Skin   Extensive Assist Verbally Normal                        Personal Care Assistance Level of Assistance  Bathing, Feeding, Dressing Bathing Assistance: Limited assistance Feeding assistance: Independent Dressing Assistance: Limited assistance     Functional Limitations Info  Sight, Hearing, Speech Sight Info: Adequate Hearing Info: Impaired Speech Info: Adequate    SPECIAL CARE FACTORS FREQUENCY        PT Frequency: 5 OT Frequency: 5            Contractures Contractures Info: Not present    Additional Factors Info  Code Status, Allergies Code Status Info: Partial  Allergies Info: Adhesive Tape, Ciprofloxacin, Latex, Prednisone           Current Medications (10/04/2016):  This is the current hospital active medication list Current Facility-Administered Medications  Medication Dose Route Frequency Provider Last Rate Last Dose  . albuterol (PROVENTIL) (2.5 MG/3ML) 0.083% nebulizer solution 2.5 mg  2.5 mg Nebulization Q4H PRN Collene Gobble, MD   2.5 mg at 10/03/16 0309  . ALPRAZolam Duanne Moron) tablet 0.5 mg  0.5 mg Oral TID PRN Collene Gobble, MD   0.5 mg at 10/03/16 1109  . amLODipine (NORVASC) tablet 10 mg  10 mg Oral Daily Collene Gobble, MD   10 mg at 10/04/16 1007  . azithromycin (ZITHROMAX) 500 mg in dextrose 5 % 250 mL IVPB  500 mg Intravenous Q24H Minda Ditto, RPH   500 mg at 10/04/16 0915  . bisacodyl (DULCOLAX) EC tablet 5 mg  5 mg  Oral Daily PRN Raylene Miyamoto, MD   5 mg at 10/02/16 0953  . cefTAZidime (FORTAZ) 2 g in dextrose 5 % 50 mL IVPB  2 g Intravenous Q8H Berton Mount, RPH   2 g at 10/04/16 0454  . chlorhexidine (PERIDEX) 0.12 % solution 15 mL  15 mL Mouth Rinse BID Collene Gobble, MD   15 mL at 10/04/16 1006  . fentaNYL (SUBLIMAZE) injection 12.5 mcg  12.5 mcg Intravenous Q2H PRN Raylene Miyamoto, MD   12.5 mcg at 10/04/16 0816  . gabapentin (NEURONTIN) capsule 200 mg  200 mg Oral Q6H Collene Gobble, MD   200 mg at 10/04/16 1006  . heparin injection 5,000 Units  5,000 Units Subcutaneous Q8H  Erick Colace, NP   5,000 Units at 10/04/16 0454  . HYDROcodone-acetaminophen (NORCO/VICODIN) 5-325 MG per tablet 1 tablet  1 tablet Oral Q6H PRN Mauri Brooklyn, MD   1 tablet at 10/03/16 2044  . insulin aspart (novoLOG) injection 0-15 Units  0-15 Units Subcutaneous TID WC Collene Gobble, MD   3 Units at 10/04/16 0810  . insulin aspart (novoLOG) injection 0-5 Units  0-5 Units Subcutaneous QHS Collene Gobble, MD   2 Units at 09/30/16 2253  . ipratropium-albuterol (DUONEB) 0.5-2.5 (3) MG/3ML nebulizer solution 3 mL  3 mL Nebulization QID Collene Gobble, MD   3 mL at 10/03/16 2001  . lactated ringers infusion   Intravenous Continuous Collene Gobble, MD 10 mL/hr at 10/03/16 1900    . MEDLINE mouth rinse  15 mL Mouth Rinse q12n4p Collene Gobble, MD   15 mL at 10/03/16 1222  . methylPREDNISolone sodium succinate (SOLU-MEDROL) 125 mg/2 mL injection 60 mg  60 mg Intravenous Q12H David Tat, MD      . oxybutynin (DITROPAN-XL) 24 hr tablet 5 mg  5 mg Oral Q breakfast Collene Gobble, MD   5 mg at 10/04/16 0809  . polyvinyl alcohol (LIQUIFILM TEARS) 1.4 % ophthalmic solution 1 drop  1 drop Both Eyes BID PRN Collene Gobble, MD   1 drop at 10/02/16 1738  . QUEtiapine (SEROQUEL) tablet 300 mg  300 mg Oral QHS Collene Gobble, MD   300 mg at 10/03/16 2212  . QUEtiapine (SEROQUEL) tablet 50 mg  50 mg Oral Q breakfast Collene Gobble, MD   50 mg at 10/04/16 0810  . theophylline (THEODUR) 12 hr tablet 300 mg  300 mg Oral BID Collene Gobble, MD   300 mg at 10/04/16 1006  . trimethoprim-polymyxin b (POLYTRIM) ophthalmic solution 1 drop  1 drop Both Eyes QID Erick Colace, NP   1 drop at 10/04/16 0915  . venlafaxine XR (EFFEXOR-XR) 24 hr capsule 150 mg  150 mg Oral Q breakfast Collene Gobble, MD   150 mg at 10/04/16 0809     Discharge Medications: Please see discharge summary for a list of discharge medications.  Relevant Imaging Results:  Relevant Lab Results:   Additional Information SSN: [id number]  Lia Hopping, LCSW

## 2016-10-04 NOTE — Progress Notes (Signed)
PROGRESS NOTE  Traci Thomas XFG:[id number] DOB: 13-May-1951 DOA: 09/28/2016 PCP: [name], NP  Brief History:  66 year old female with a history of COPD and chronic respiratory failure on 3 L oxygen at home, diabetes mellitus, hypertension presenting with 5-7 day history of increasing fatigue, confusion, shortness of breath, and nonproductive cough. Upon arrival to the emergency department, the patient was noted to have a temperature of 102.9F, HR 139 and oxygen sat 76%. WBC was 21.1. Chest x-ray showed bibasilar opacities. The patient was placed on BiPAP and administered Solu-Medrol, azithromycin, ceftriaxone, and nebulizers.  The patient's lactic acid increased to 3.03 despite appropriate fluid bolus for sepsis, and ultimately peaked at 5.2.  The patient was admitted to [facility].  Respiratory viral panel and blood cultures were negative. The patient was escorted started on vancomycin and ceftazidime on 09/28/2016.  The patient has been slow to improve. Her BiPAP has been gradually weaned to be used on an as-needed basis during the day and nighttime. The patient was transferred to Hospitalist service on 10/04/16  Assessment/Plan: Acute on chronic respiratory failure with hypoxia -Secondary COPD and HCAP -continue HFNC with prn BiPAP -continue slow wean back to baseline oxygen-->keep sat 88-92% -pulmonary hygiene -I have weaned HFNC from 15L to 9L-->sat ~92% -on 3L at baseline  HCAP -Finishing 7 days of azithromycin and ceftazidime on 10/04/2016 -09/28/2016 blood cultures negative -Urine legionella antigen, urine Streptococcus pneumoniae antigen negative  COPD exacerbation -Continue steroids-->change to solumedrol 60mg  bid -Continue duo nebs -Continue theophylline -Presently stable on HFNC-->wean as above  Sepsis -Patient presented with tachycardia, respiratory failure, lactic acidosis, and leukocytosis -Sepsis physiology improved -Lactic acid peaked 5.2 -Procalcitonin  peaked 37.3 -09/28/2016 blood cultures negative -09/28/2016 urine cultures negative -Respiratory viral panel negative -Finished 7 days antibiotics 96/78/9381  Acute metabolic encephalopathy -Multifactorial including medications, sepsis, respiratory failure -Suspect underlying cognitive impairment--unclear baseline -improving -Continue Quetiapine, Effexor  -B12-- -TSH--  Abdominal pain -suspect aerophagia from BiPAP and injection site reaction from [place] heparin -2 view AXR -no vomiting  Diabetes mellitus type 2  -Check hemoglobin A1c  -NovoLog sliding scale   Hypertension -Continue amlodipine  Depression/history of chronic anxiety -Continue Effexor -continue prn alprazolam  History of urinary retention  -Continue Ditropan    Disposition Plan:   SNF in 2-3 days  Family Communication:   No Family at bedside  Consultants:  PCCM transfer  Code Status:  Partial DNR--no defib, no compressions  DVT Prophylaxis:  [place] Heparin  Total time spent 45 minutes.  Greater than 50% spent face to face counseling and coordinating care.    Procedures: As Listed in Progress Note Above  Antibiotics: Azithromycin 4/11>>4/17 ceftaz 4/11/>>4/17 vanco 4/11>>4/13    Subjective: Patient complains of left lower quadrant abdominal pain. Denies any nausea, vomiting, chest pain, headache, fevers, chills. She states that her breathing is a little better but continues to be short of breath with exertion. Denies any diarrhea, dysuria.  Objective: Vitals:   10/03/16 2100 10/03/16 2125 10/04/16 0542 10/04/16 0546  BP:  (!) 162/92 139/69   Pulse:  (!) 113 (!) 102   Resp: 16  16   Temp:  99.1 F (37.3 C) 97.7 F (36.5 C)   TempSrc:  Oral Oral   SpO2:  93% 93%   Weight:    60.1 kg (132 lb 7.9 oz)  Height:        Intake/Output Summary (Last 24 hours) at 10/04/16 0734 Last data filed at 10/04/16 0600  Gross per 24 hour  Intake             2100 ml  Output             1175 ml  Net               925 ml   Weight change: -3 kg (-6 lb 9.8 oz) Exam:   General:  Pt is alert, follows commands appropriately, not in acute distress  HEENT: No icterus, No thrush, No neck mass, [place]/AT  Cardiovascular: RRR, S1/S2, no rubs, no gallops  Respiratory: bibasilar rales. Bibasilar respiratory wheeze.  Abdomen: Soft/+BS, non tender, non distended, no guarding  Extremities: No edema, No lymphangitis, No petechiae, No rashes, no synovitis   Data Reviewed: I have personally reviewed following labs and imaging studies Basic Metabolic Panel:  Recent Labs Lab 09/28/16 0855 09/28/16 1756 09/29/16 0331 10/02/16 0349 10/04/16 0625  NA 140  --  141 141 140  K 4.4  --  4.6 3.5 3.7  CL 101  --  107 100* 96*  CO2 29  --  27 32 37*  GLUCOSE 231*  --  161* 124* 162*  BUN 23*  --  18 13 18   CREATININE 0.90 1.01* 0.66 0.80 0.92  CALCIUM 9.2  --  8.5* 8.8* 9.0  MG  --  2.0 2.1  --   --   PHOS  --  2.7  --   --   --    Liver Function Tests:  Recent Labs Lab 09/28/16 0855  AST 16  ALT 9*  ALKPHOS 131*  BILITOT 0.5  PROT 8.0  ALBUMIN 4.1   No results for input(s): LIPASE, AMYLASE in the last 168 hours. No results for input(s): AMMONIA in the last 168 hours. Coagulation Profile: No results for input(s): INR, PROTIME in the last 168 hours. CBC:  Recent Labs Lab 09/28/16 0855 09/28/16 1756 09/29/16 0331 10/02/16 0349 10/04/16 0625  WBC 16.1* 21.1* 19.6* 10.2 10.0  NEUTROABS 13.8*  --   --   --   --   HGB 11.6* 10.7* 10.1* 10.8* 10.6*  HCT 37.5 35.1* 33.0* 34.2* 34.0*  MCV 89.9 94.1 92.7 89.5 90.4  PLT 278 257 239 257 265   Cardiac Enzymes:  Recent Labs Lab 09/28/16 1756 09/28/16 2140 09/29/16 0331  TROPONINI <0.03 <0.03 <0.03   BNP: Invalid input(s): POCBNP CBG:  Recent Labs Lab 10/02/16 2128 10/03/16 0805 10/03/16 1140 10/03/16 1647 10/03/16 2203  GLUCAP 178* 120* 384* 143* 116*   HbA1C: No results for input(s): HGBA1C in the last 72 hours. Urine  analysis:    Component Value Date/Time   COLORURINE YELLOW 09/28/2016 1659   APPEARANCEUR CLEAR 09/28/2016 1659   LABSPEC 1.020 09/28/2016 1659   PHURINE 5.0 09/28/2016 1659   GLUCOSEU 150 (A) 09/28/2016 1659   HGBUR MODERATE (A) 09/28/2016 1659   BILIRUBINUR NEGATIVE 09/28/2016 1659   KETONESUR NEGATIVE 09/28/2016 1659   PROTEINUR NEGATIVE 09/28/2016 1659   UROBILINOGEN 0.2 03/26/2015 1146   NITRITE NEGATIVE 09/28/2016 1659   LEUKOCYTESUR NEGATIVE 09/28/2016 1659   Sepsis Labs: @LABRCNTIP (procalcitonin:4,lacticidven:4) ) Recent Results (from the past 240 hour(s))  Blood Culture (routine x 2)     Status: None   Collection Time: 09/28/16  8:56 AM  Result Value Ref Range Status   Specimen Description BLOOD RIGHT ANTECUBITAL  Final   Special Requests   Final    BOTTLES DRAWN AEROBIC AND ANAEROBIC Blood Culture adequate volume   Culture   Final  NO GROWTH 5 DAYS Performed at [facility], [place] [address] [place], [place] [address]    Report Status 10/03/2016 FINAL  Final  Blood Culture (routine x 2)     Status: None   Collection Time: 09/28/16  9:30 AM  Result Value Ref Range Status   Specimen Description BLOOD RIGHT HAND  Final   Special Requests   Final    BOTTLES DRAWN AEROBIC AND ANAEROBIC Blood Culture adequate volume   Culture   Final    NO GROWTH 5 DAYS Performed at [facility], [place] [address], [place], [place] [address]    Report Status 10/03/2016 FINAL  Final  Urine culture     Status: None   Collection Time: 09/28/16  3:30 PM  Result Value Ref Range Status   Specimen Description URINE, RANDOM  Final   Special Requests NONE  Final   Culture   Final    NO GROWTH Performed at [facility], [place] [address] [place], [place] [address]    Report Status 09/30/2016 FINAL  Final  Respiratory Panel by PCR     Status: None   Collection Time: 09/28/16  3:40 PM  Result Value Ref Range Status   Adenovirus NOT DETECTED NOT DETECTED Final    Coronavirus 229E NOT DETECTED NOT DETECTED Final   Coronavirus HKU1 NOT DETECTED NOT DETECTED Final   Coronavirus NL63 NOT DETECTED NOT DETECTED Final   Coronavirus OC43 NOT DETECTED NOT DETECTED Final   Metapneumovirus NOT DETECTED NOT DETECTED Final   Rhinovirus / Enterovirus NOT DETECTED NOT DETECTED Final   Influenza A NOT DETECTED NOT DETECTED Final   Influenza B NOT DETECTED NOT DETECTED Final   Parainfluenza Virus 1 NOT DETECTED NOT DETECTED Final   Parainfluenza Virus 2 NOT DETECTED NOT DETECTED Final   Parainfluenza Virus 3 NOT DETECTED NOT DETECTED Final   Parainfluenza Virus 4 NOT DETECTED NOT DETECTED Final   Respiratory Syncytial Virus NOT DETECTED NOT DETECTED Final   Bordetella pertussis NOT DETECTED NOT DETECTED Final   Chlamydophila pneumoniae NOT DETECTED NOT DETECTED Final   Mycoplasma pneumoniae NOT DETECTED NOT DETECTED Final    Comment: Performed at [facility], [place] [address] [place], [place] [address]  MRSA PCR Screening     Status: None   Collection Time: 09/28/16  4:47 PM  Result Value Ref Range Status   MRSA by PCR NEGATIVE NEGATIVE Final    Comment:        The GeneXpert MRSA Assay (FDA approved for NASAL specimens only), is one component of a comprehensive MRSA colonization surveillance program. It is not intended to diagnose MRSA infection nor to guide or monitor treatment for MRSA infections.   Culture, blood (routine x 2)     Status: None   Collection Time: 09/28/16  5:57 PM  Result Value Ref Range Status   Specimen Description BLOOD RIGHT ANTECUBITAL  Final   Special Requests Blood Culture adequate volume IN PEDIATRIC BOTTLE  Final   Culture   Final    NO GROWTH 5 DAYS Performed at [facility], [place] [address], [place], [place] [address]    Report Status 10/03/2016 FINAL  Final  Culture, blood (routine x 2)     Status: None   Collection Time: 09/28/16  7:16 PM  Result Value Ref Range Status   Specimen Description BLOOD LEFT  HAND  Final   Special Requests Blood Culture adequate volume IN PEDIATRIC BOTTLE  Final  Culture   Final    NO GROWTH 5 DAYS Performed at [facility], [place] [address] [place], [place] [address]    Report Status 10/03/2016 FINAL  Final     Scheduled Meds: . amLODipine  10 mg Oral Daily  . azithromycin  500 mg Intravenous Q24H  . cefTAZidime (FORTAZ)  IV  2 g Intravenous Q8H  . chlorhexidine  15 mL Mouth Rinse BID  . gabapentin  200 mg Oral Q6H  . heparin  5,000 Units Subcutaneous Q8H  . insulin aspart  0-15 Units Subcutaneous TID WC  . insulin aspart  0-5 Units Subcutaneous QHS  . ipratropium-albuterol  3 mL Nebulization QID  . mouth rinse  15 mL Mouth Rinse q12n4p  . oxybutynin  5 mg Oral Q breakfast  . predniSONE  40 mg Oral Q breakfast  . QUEtiapine  300 mg Oral QHS  . QUEtiapine  50 mg Oral Q breakfast  . theophylline  300 mg Oral BID  . trimethoprim-polymyxin b  1 drop Both Eyes QID  . venlafaxine XR  150 mg Oral Q breakfast   Continuous Infusions: . lactated ringers 10 mL/hr at 10/03/16 1900    Procedures/Studies: Dg Abd 1 View  Result Date: 09/28/2016 CLINICAL DATA:  Abdominal pain and diarrhea. Abdominal distention for few days. EXAM: ABDOMEN - 1 VIEW COMPARISON:  Pelvic radiograph Oct 21, 2015 FINDINGS: Moderate retained large bowel stool. Bowel gas pattern is nondilated and nonobstructive. No intra-abdominal mass effect or pathologic calcification. Phleboliths LEFT pelvis. RIGHT femur ORIF. Soft tissue planes and included osseous structures are nonsuspicious. Osteopenia. IMPRESSION: Moderate large bowel stool, normal bowel gas pattern. Electronically Signed   By: Elon Alas M.D.   On: 09/28/2016 21:11   Dg Chest Port 1 View  Result Date: 09/29/2016 CLINICAL DATA:  Followup pneumonia EXAM: PORTABLE CHEST 1 VIEW COMPARISON:  Or 04/2017 FINDINGS: Heart size remains normal. Chronic aortic atherosclerosis. Background chronic fibrosis pattern with superimposed  density in both lower lobes consistent with bronchopneumonia. No dense consolidation or lobar collapse. No measurable effusion. No new finding. IMPRESSION: Chronic lung disease with fibrotic pattern. Increased markings at the bases consistent with active lower lobe bronchopneumonia, similar in radiographic appearance to yesterday. Electronically Signed   By: Nelson Chimes M.D.   On: 09/29/2016 06:59   Dg Chest Port 1 View  Result Date: 09/28/2016 CLINICAL DATA:  Increased shortness of breath, wheezing and decreased oxygen saturation this morning, history COPD, diabetes mellitus, asthma, dementia EXAM: PORTABLE CHEST 1 VIEW COMPARISON:  Portable exam 0858 hours compared to 10/20/2015 FINDINGS: Normal heart size, mediastinal contours, and pulmonary vascularity. Atherosclerotic calcification aorta. Emphysematous changes with new bibasilar opacities suspicious for pneumonia. No pleural effusion or pneumothorax. Bones diffusely demineralized IMPRESSION: COPD changes with new bibasilar opacities question pneumonia. Electronically Signed   By: Lavonia Dana M.D.   On: 09/28/2016 09:12    Flara Storti, DO  [facility] Pager [phone]  If 7PM-7AM, please contact night-coverage [email or web address] Password [facility] 10/04/2016, 7:34 AM   LOS: 6 days

## 2016-10-04 NOTE — Care Management Important Message (Signed)
Important Message  Patient Details  Name: Traci Thomas MRN: [id number] Date of Birth: 1951/04/17   Medicare Important Message Given:  Yes    Kerin Salen 10/04/2016, 10:08 AMImportant Message  Patient Details  Name: Traci Thomas MRN: [id number] Date of Birth: Oct 14, 1950   Medicare Important Message Given:  Yes    Kerin Salen 10/04/2016, 10:08 AM

## 2016-10-05 DIAGNOSIS — G9341 Metabolic encephalopathy: Secondary | ICD-10-CM

## 2016-10-05 LAB — BASIC METABOLIC PANEL
Anion gap: 11 (ref 5–15)
BUN: 19 mg/dL (ref 6–20)
CHLORIDE: 93 mmol/L — AB (ref 101–111)
CO2: 34 mmol/L — ABNORMAL HIGH (ref 22–32)
CREATININE: 0.84 mg/dL (ref 0.44–1.00)
Calcium: 9.8 mg/dL (ref 8.9–10.3)
GFR calc Af Amer: >60 mL/min (ref >60)
GFR calc non Af Amer: >60 mL/min (ref >60)
GLUCOSE: 144 mg/dL — AB (ref 65–99)
POTASSIUM: 4.4 mmol/L (ref 3.5–5.1)
Sodium: 138 mmol/L (ref 135–145)

## 2016-10-05 LAB — PHOSPHORUS: Phosphorus: 3.3 mg/dL (ref 2.5–4.6)

## 2016-10-05 LAB — MAGNESIUM: Magnesium: 2.1 mg/dL (ref 1.7–2.4)

## 2016-10-05 LAB — GLUCOSE, CAPILLARY
GLUCOSE-CAPILLARY: 264 mg/dL — AB (ref 65–99)
Glucose-Capillary: 122 mg/dL — ABNORMAL HIGH (ref 65–99)
Glucose-Capillary: 149 mg/dL — ABNORMAL HIGH (ref 65–99)
Glucose-Capillary: 163 mg/dL — ABNORMAL HIGH (ref 65–99)

## 2016-10-05 LAB — VITAMIN B12: VITAMIN B 12: 1064 pg/mL — AB (ref 180–914)

## 2016-10-05 LAB — T4, FREE: FREE T4: 0.68 ng/dL (ref 0.61–1.12)

## 2016-10-05 LAB — TSH: TSH: 0.435 u[IU]/mL (ref 0.350–4.500)

## 2016-10-05 MED ORDER — ALPRAZOLAM 0.5 MG PO TABS
0.5000 mg | ORAL_TABLET | Freq: Once | ORAL | Status: AC
  Administered 2016-10-05: 0.5 mg via ORAL

## 2016-10-05 NOTE — Progress Notes (Signed)
PROGRESS NOTE    TARISSA KERIN  NLG:[id number] DOB: February 03, 1951 DOA: 09/28/2016 PCP: [name], NP   Brief Narrative: Traci Thomas is a 66 y.o. female with a history of COPD and chronic respiratory failure on 3 L oxygen at home, diabetes mellitus, hypertension. Patient presented with acute respiratory secondary to HCAP and COPD exacerbation   Assessment & Plan:   Active Problems:   COPD exacerbation (HCC)   Acute on chronic respiratory failure with hypoxia (HCC)   Acute respiratory failure (HCC)   Abdominal pain   Sepsis due to pneumonia ([place])   Lobar pneumonia ([place])   Acute metabolic encephalopathy   Acute on chronic respiratory failure with hypoxia Secondary to HCAP and COPD exacerbation. HCAP treated and likely secondary to COPD now. -wean to 3L  HCAP Completed course.  COPD exacerbation -continue HFNC and wean -continue solumedrol BID -continue Duonebs -continue theophylline  Sepsis Initially secondary to HCAP. Physiology resolved.  Acute metabolic encephalopathy Appears resolved  Abdominal pain Mild. Abdominal x-ray significant for possible developing ileus  Diabetes mellitus, type 2 -continue SSI  Essential hypertension -continue amlodipine  Depression Chronic anxiety -continue Effexor -continue alprazolam prn  History of urinary retention -continue Ditropan   DVT prophylaxis: heparin Code Status: DNR/Partial (Intubate and Bipap only) Family Communication: None at bedside Disposition Plan: Discharge pending respiratory improvement   Consultants:   None  Procedures:   None  Antimicrobials:  Vancomycin (4/11>4/13)  Azithromycin (4/11>>4/17)  Ceftazidime ((4/11>>4/17)    Subjective: Patient reports improved dyspnea. Coughing without sputum.  Objective: Vitals:   10/05/16 1017 10/05/16 1400 10/05/16 1518 10/05/16 1535  BP:   133/74   Pulse:   (!) 105   Resp:   16   Temp:   98.7 F (37.1 C)   TempSrc:   Axillary     SpO2:  (!) 88% 92% 98%  Weight: 67.4 kg (148 lb 9.4 oz)     Height:        Intake/Output Summary (Last 24 hours) at 10/05/16 1731 Last data filed at 10/05/16 1017  Gross per 24 hour  Intake              360 ml  Output                0 ml  Net              360 ml   Filed Weights   10/03/16 0400 10/04/16 0546 10/05/16 1017  Weight: 63.1 kg (139 lb 1.8 oz) 60.1 kg (132 lb 7.9 oz) 67.4 kg (148 lb 9.4 oz)    Examination:  General exam: Appears calm and comfortable. Chronically ill appearing. Respiratory system: decreased breath sounds, wheezing bilaterally. Respiratory effort normal. Cardiovascular system: S1 & S2 heard, RRR. No murmurs. Gastrointestinal system: Abdomen is nondistended, soft and nontender. Normal bowel sounds heard. Central nervous system: Alert and oriented. No focal neurological deficits. Extremities: No edema. No calf tenderness Skin: No cyanosis. No rashes Psychiatry: Judgement and insight appear normal. Mood & affect appropriate.     Data Reviewed: I have personally reviewed following labs and imaging studies  CBC:  Recent Labs Lab 09/28/16 1756 09/29/16 0331 10/02/16 0349 10/04/16 0625  WBC 21.1* 19.6* 10.2 10.0  HGB 10.7* 10.1* 10.8* 10.6*  HCT 35.1* 33.0* 34.2* 34.0*  MCV 94.1 92.7 89.5 90.4  PLT 257 239 257 081   Basic Metabolic Panel:  Recent Labs Lab 09/28/16 1756 09/29/16 0331 10/02/16 0349 10/04/16 0625 10/05/16 0705  NA  --  141 141 140 138  K  --  4.6 3.5 3.7 4.4  CL  --  107 100* 96* 93*  CO2  --  27 32 37* 34*  GLUCOSE  --  161* 124* 162* 144*  BUN  --  18 13 18 19   CREATININE 1.01* 0.66 0.80 0.92 0.84  CALCIUM  --  8.5* 8.8* 9.0 9.8  MG 2.0 2.1  --   --  2.1  PHOS 2.7  --   --   --  3.3   GFR: Estimated Creatinine Clearance: 55 mL/min (by C-G formula based on SCr of 0.84 mg/dL). Liver Function Tests: No results for input(s): AST, ALT, ALKPHOS, BILITOT, PROT, ALBUMIN in the last 168 hours. No results for input(s):  LIPASE, AMYLASE in the last 168 hours. No results for input(s): AMMONIA in the last 168 hours. Coagulation Profile: No results for input(s): INR, PROTIME in the last 168 hours. Cardiac Enzymes:  Recent Labs Lab 09/28/16 1756 09/28/16 2140 09/29/16 0331  TROPONINI <0.03 <0.03 <0.03   BNP (last 3 results) No results for input(s): PROBNP in the last 8760 hours. HbA1C: No results for input(s): HGBA1C in the last 72 hours. CBG:  Recent Labs Lab 10/04/16 1658 10/04/16 2112 10/05/16 0737 10/05/16 1151 10/05/16 1713  GLUCAP 187* 111* 122* 264* 149*   Lipid Profile: No results for input(s): CHOL, HDL, LDLCALC, TRIG, CHOLHDL, LDLDIRECT in the last 72 hours. Thyroid Function Tests:  Recent Labs  10/05/16 0705  TSH 0.435  FREET4 0.68   Anemia Panel:  Recent Labs  10/05/16 0705  VITAMINB12 1,064*   Sepsis Labs:  Recent Labs Lab 09/28/16 1756 09/28/16 1757 09/29/16 0331 09/30/16 0321  PROCALCITON 31.48  --  37.32 25.19  LATICACIDVEN  --  5.2* 1.0  --     Recent Results (from the past 240 hour(s))  Blood Culture (routine x 2)     Status: None   Collection Time: 09/28/16  8:56 AM  Result Value Ref Range Status   Specimen Description BLOOD RIGHT ANTECUBITAL  Final   Special Requests   Final    BOTTLES DRAWN AEROBIC AND ANAEROBIC Blood Culture adequate volume   Culture   Final    NO GROWTH 5 DAYS Performed at [facility], [place] [address] [place], [place] [address]    Report Status 10/03/2016 FINAL  Final  Blood Culture (routine x 2)     Status: None   Collection Time: 09/28/16  9:30 AM  Result Value Ref Range Status   Specimen Description BLOOD RIGHT HAND  Final   Special Requests   Final    BOTTLES DRAWN AEROBIC AND ANAEROBIC Blood Culture adequate volume   Culture   Final    NO GROWTH 5 DAYS Performed at [facility], [place] [address], [place], [place] [address]    Report Status 10/03/2016 FINAL  Final  Urine culture     Status: None    Collection Time: 09/28/16  3:30 PM  Result Value Ref Range Status   Specimen Description URINE, RANDOM  Final   Special Requests NONE  Final   Culture   Final    NO GROWTH Performed at [facility], [place] [address] [place],  [address]    Report Status 09/30/2016 FINAL  Final  Respiratory Panel by PCR     Status: None   Collection Time: 09/28/16  3:40 PM  Result Value Ref Range Status   Adenovirus NOT DETECTED NOT DETECTED Final  Coronavirus 229E NOT DETECTED NOT DETECTED Final   Coronavirus HKU1 NOT DETECTED NOT DETECTED Final   Coronavirus NL63 NOT DETECTED NOT DETECTED Final   Coronavirus OC43 NOT DETECTED NOT DETECTED Final   Metapneumovirus NOT DETECTED NOT DETECTED Final   Rhinovirus / Enterovirus NOT DETECTED NOT DETECTED Final   Influenza A NOT DETECTED NOT DETECTED Final   Influenza B NOT DETECTED NOT DETECTED Final   Parainfluenza Virus 1 NOT DETECTED NOT DETECTED Final   Parainfluenza Virus 2 NOT DETECTED NOT DETECTED Final   Parainfluenza Virus 3 NOT DETECTED NOT DETECTED Final   Parainfluenza Virus 4 NOT DETECTED NOT DETECTED Final   Respiratory Syncytial Virus NOT DETECTED NOT DETECTED Final   Bordetella pertussis NOT DETECTED NOT DETECTED Final   Chlamydophila pneumoniae NOT DETECTED NOT DETECTED Final   Mycoplasma pneumoniae NOT DETECTED NOT DETECTED Final    Comment: Performed at [facility], [place] [address] [place], [place] [address]  MRSA PCR Screening     Status: None   Collection Time: 09/28/16  4:47 PM  Result Value Ref Range Status   MRSA by PCR NEGATIVE NEGATIVE Final    Comment:        The GeneXpert MRSA Assay (FDA approved for NASAL specimens only), is one component of a comprehensive MRSA colonization surveillance program. It is not intended to diagnose MRSA infection nor to guide or monitor treatment for MRSA infections.   Culture, blood (routine x 2)     Status: None   Collection Time: 09/28/16  5:57 PM  Result Value Ref  Range Status   Specimen Description BLOOD RIGHT ANTECUBITAL  Final   Special Requests Blood Culture adequate volume IN PEDIATRIC BOTTLE  Final   Culture   Final    NO GROWTH 5 DAYS Performed at [facility], [place] [address] [place], [place] [address]    Report Status 10/03/2016 FINAL  Final  Culture, blood (routine x 2)     Status: None   Collection Time: 09/28/16  7:16 PM  Result Value Ref Range Status   Specimen Description BLOOD LEFT HAND  Final   Special Requests Blood Culture adequate volume IN PEDIATRIC BOTTLE  Final   Culture   Final    NO GROWTH 5 DAYS Performed at [facility], [place] [address] [place], [place] [address]    Report Status 10/03/2016 FINAL  Final         Radiology Studies: Dg Abd 2 Views  Result Date: 10/04/2016 CLINICAL DATA:  Distended abdomen. EXAM: ABDOMEN - 2 VIEW COMPARISON:  09/28/2016. FINDINGS: Soft tissue structures are unremarkable. Air-filled loops of small large bowel noted. Mild adynamic ileus cannot be excluded. No free air identified. Degenerative changes thoracolumbar spine and left hip. Pelvic calcifications most likely phleboliths. Postsurgical changes right hip. Bibasilar subsegmental atelectasis and/or scarring. IMPRESSION: Findings suggesting mild adynamic ileus. Electronically Signed   By: [place]   On: 10/04/2016 11:04        Scheduled Meds: . amLODipine  10 mg Oral Daily  . chlorhexidine  15 mL Mouth Rinse BID  . gabapentin  200 mg Oral Q6H  . heparin  5,000 Units Subcutaneous Q8H  . insulin aspart  0-15 Units Subcutaneous TID WC  . insulin aspart  0-5 Units Subcutaneous QHS  . ipratropium-albuterol  3 mL Nebulization QID  . mouth rinse  15 mL Mouth Rinse q12n4p  . methylPREDNISolone (SOLU-MEDROL) injection  60 mg Intravenous Q12H  . oxybutynin  5 mg  Oral Q breakfast  . QUEtiapine  300 mg Oral QHS  . QUEtiapine  50 mg Oral Q breakfast  . theophylline  300 mg Oral BID  . venlafaxine XR  150 mg Oral Q  breakfast   Continuous Infusions: . lactated ringers 10 mL/hr at 10/03/16 1900     LOS: 7 days     Cordelia Poche, MD [facility] 10/05/2016, 5:31 PM Pager: [phone]  If 7PM-7AM, please contact night-coverage [email or web address] Password [facility] 10/05/2016, 5:31 PM

## 2016-10-06 ENCOUNTER — Inpatient Hospital Stay (HOSPITAL_COMMUNITY): Payer: Medicare Other

## 2016-10-06 LAB — GLUCOSE, CAPILLARY
GLUCOSE-CAPILLARY: 155 mg/dL — AB (ref 65–99)
GLUCOSE-CAPILLARY: 208 mg/dL — AB (ref 65–99)
GLUCOSE-CAPILLARY: 270 mg/dL — AB (ref 65–99)
GLUCOSE-CAPILLARY: 85 mg/dL (ref 65–99)

## 2016-10-06 LAB — HEMOGLOBIN A1C
HEMOGLOBIN A1C: 7.2 % — AB (ref 4.8–5.6)
Mean Plasma Glucose: 160 mg/dL

## 2016-10-06 MED ORDER — IPRATROPIUM-ALBUTEROL 0.5-2.5 (3) MG/3ML IN SOLN
3.0000 mL | Freq: Four times a day (QID) | RESPIRATORY_TRACT | Status: DC
  Administered 2016-10-07 – 2016-10-09 (×9): 3 mL via RESPIRATORY_TRACT
  Filled 2016-10-06 (×12): qty 3

## 2016-10-06 NOTE — Progress Notes (Signed)
Tap water performed earlier this afternoon,tolerated 1000cc. No results noted. Will continue monitor.

## 2016-10-06 NOTE — Progress Notes (Signed)
Date:  October 06, 2016 Chart reviewed for concurrent status and case management needs. Will continue to follow patient progress. HFNC at 8l/min progressing slowly to lower needs. Discharge Planning: following for needs Expected discharge date: [id number] Velva Harman, BSN, [place], [place]

## 2016-10-06 NOTE — Progress Notes (Signed)
PROGRESS NOTE    Traci Thomas  UYQ:[id number] DOB: Feb 25, 1951 DOA: 09/28/2016 PCP: [name], NP   Brief Narrative: Traci Thomas is a 66 y.o. female with a history of COPD and chronic respiratory failure on 3 L oxygen at home, diabetes mellitus, hypertension. Patient presented with acute respiratory secondary to HCAP and COPD exacerbation   Assessment & Plan:   Active Problems:   COPD exacerbation (HCC)   Acute on chronic respiratory failure with hypoxia (HCC)   Acute respiratory failure (HCC)   Abdominal pain   Sepsis due to pneumonia ([place])   Lobar pneumonia ([place])   Acute metabolic encephalopathy   Acute on chronic respiratory failure with hypoxia Secondary to HCAP and COPD exacerbation. HCAP treated and likely secondary to COPD now. -wean oxygen  HCAP Completed course.  COPD exacerbation Does not appear to be at baseline and still very wheezy with minimal air movement. -continue HFNC and wean -continue solumedrol BID -continue Duonebs -continue theophylline -PT eval  Sepsis Initially secondary to HCAP. Physiology resolved.  Acute metabolic encephalopathy Appears resolved  Abdominal pain Improved. Likely secondary to constipation -enema  Diabetes mellitus, type 2 -continue SSI  Essential hypertension -continue amlodipine  Depression Chronic anxiety -continue Effexor -continue alprazolam prn  History of urinary retention -continue Ditropan -in/out cath prn   DVT prophylaxis: heparin Code Status: DNR/Partial (Intubate and Bipap only) Family Communication: None at bedside Disposition Plan: Discharge pending respiratory improvement   Consultants:   None  Procedures:   None  Antimicrobials:  Vancomycin (4/11>4/13)  Azithromycin (4/11>>4/17)  Ceftazidime ((4/11>>4/17)    Subjective: No dyspnea or chest pain. Recent small volume bowel movement. Passing gas.  Objective: Vitals:   10/06/16 0402 10/06/16 0824 10/06/16 1014  10/06/16 1300  BP: (!) 144/74  (!) 144/69 (!) 149/95  Pulse: (!) 101  98 (!) 103  Resp: 20   (!) 21  Temp: 98.5 F (36.9 C)   98.7 F (37.1 C)  TempSrc: Oral   Oral  SpO2: 94% (!) 89%  99%  Weight:      Height:        Intake/Output Summary (Last 24 hours) at 10/06/16 1548 Last data filed at 10/06/16 1417  Gross per 24 hour  Intake          1061.66 ml  Output             1260 ml  Net          -198.34 ml   Filed Weights   10/03/16 0400 10/04/16 0546 10/05/16 1017  Weight: 63.1 kg (139 lb 1.8 oz) 60.1 kg (132 lb 7.9 oz) 67.4 kg (148 lb 9.4 oz)    Examination:  General exam: Appears calm and comfortable. Chronically ill appearing. Respiratory system: decreased breath sounds, wheezing bilaterally with prolonged expiratory phase. Respiratory effort normal. Speaks in full sentences with ease. Cardiovascular system: S1 & S2 heard, RRR. No murmurs. Gastrointestinal system: Abdomen is slightly distended, soft and nontender. Normal bowel sounds heard. Central nervous system: Alert and oriented. No focal neurological deficits. Extremities: No edema. No calf tenderness Skin: No cyanosis. No rashes Psychiatry: Judgement and insight appear normal. Mood & affect appropriate.     Data Reviewed: I have personally reviewed following labs and imaging studies  CBC:  Recent Labs Lab 10/02/16 0349 10/04/16 0625  WBC 10.2 10.0  HGB 10.8* 10.6*  HCT 34.2* 34.0*  MCV 89.5 90.4  PLT 257 638   Basic Metabolic Panel:  Recent Labs Lab 10/02/16 0349 10/04/16  3893 10/05/16 0705  NA 141 140 138  K 3.5 3.7 4.4  CL 100* 96* 93*  CO2 32 37* 34*  GLUCOSE 124* 162* 144*  BUN 13 18 19   CREATININE 0.80 0.92 0.84  CALCIUM 8.8* 9.0 9.8  MG  --   --  2.1  PHOS  --   --  3.3   GFR: Estimated Creatinine Clearance: 55 mL/min (by C-G formula based on SCr of 0.84 mg/dL). Liver Function Tests: No results for input(s): AST, ALT, ALKPHOS, BILITOT, PROT, ALBUMIN in the last 168 hours. No  results for input(s): LIPASE, AMYLASE in the last 168 hours. No results for input(s): AMMONIA in the last 168 hours. Coagulation Profile: No results for input(s): INR, PROTIME in the last 168 hours. Cardiac Enzymes: No results for input(s): CKTOTAL, CKMB, CKMBINDEX, TROPONINI in the last 168 hours. BNP (last 3 results) No results for input(s): PROBNP in the last 8760 hours. HbA1C:  Recent Labs  10/05/16 0705  HGBA1C 7.2*   CBG:  Recent Labs Lab 10/05/16 1151 10/05/16 1713 10/05/16 2102 10/06/16 0728 10/06/16 1144  GLUCAP 264* 149* 163* 155* 208*   Lipid Profile: No results for input(s): CHOL, HDL, LDLCALC, TRIG, CHOLHDL, LDLDIRECT in the last 72 hours. Thyroid Function Tests:  Recent Labs  10/05/16 0705  TSH 0.435  FREET4 0.68   Anemia Panel:  Recent Labs  10/05/16 0705  VITAMINB12 1,064*   Sepsis Labs:  Recent Labs Lab 09/30/16 0321  PROCALCITON 25.19    Recent Results (from the past 240 hour(s))  Blood Culture (routine x 2)     Status: None   Collection Time: 09/28/16  8:56 AM  Result Value Ref Range Status   Specimen Description BLOOD RIGHT ANTECUBITAL  Final   Special Requests   Final    BOTTLES DRAWN AEROBIC AND ANAEROBIC Blood Culture adequate volume   Culture   Final    NO GROWTH 5 DAYS Performed at [facility], [place] [address] [place], [place] [address]    Report Status 10/03/2016 FINAL  Final  Blood Culture (routine x 2)     Status: None   Collection Time: 09/28/16  9:30 AM  Result Value Ref Range Status   Specimen Description BLOOD RIGHT HAND  Final   Special Requests   Final    BOTTLES DRAWN AEROBIC AND ANAEROBIC Blood Culture adequate volume   Culture   Final    NO GROWTH 5 DAYS Performed at [facility], [place] [address] [place], [place] [address]    Report Status 10/03/2016 FINAL  Final  Urine culture     Status: None   Collection Time: 09/28/16  3:30 PM  Result Value Ref Range Status   Specimen Description URINE,  RANDOM  Final   Special Requests NONE  Final   Culture   Final    NO GROWTH Performed at [facility], [place] [address], [place], [place] [address]    Report Status 09/30/2016 FINAL  Final  Respiratory Panel by PCR     Status: None   Collection Time: 09/28/16  3:40 PM  Result Value Ref Range Status   Adenovirus NOT DETECTED NOT DETECTED Final   Coronavirus 229E NOT DETECTED NOT DETECTED Final   Coronavirus HKU1 NOT DETECTED NOT DETECTED Final   Coronavirus NL63 NOT DETECTED NOT DETECTED Final   Coronavirus OC43 NOT DETECTED NOT DETECTED Final   Metapneumovirus NOT DETECTED NOT DETECTED Final   Rhinovirus / Enterovirus NOT DETECTED NOT DETECTED  Final   Influenza A NOT DETECTED NOT DETECTED Final   Influenza B NOT DETECTED NOT DETECTED Final   Parainfluenza Virus 1 NOT DETECTED NOT DETECTED Final   Parainfluenza Virus 2 NOT DETECTED NOT DETECTED Final   Parainfluenza Virus 3 NOT DETECTED NOT DETECTED Final   Parainfluenza Virus 4 NOT DETECTED NOT DETECTED Final   Respiratory Syncytial Virus NOT DETECTED NOT DETECTED Final   Bordetella pertussis NOT DETECTED NOT DETECTED Final   Chlamydophila pneumoniae NOT DETECTED NOT DETECTED Final   Mycoplasma pneumoniae NOT DETECTED NOT DETECTED Final    Comment: Performed at [facility], [place] [address] [place], [place] [address]  MRSA PCR Screening     Status: None   Collection Time: 09/28/16  4:47 PM  Result Value Ref Range Status   MRSA by PCR NEGATIVE NEGATIVE Final    Comment:        The GeneXpert MRSA Assay (FDA approved for NASAL specimens only), is one component of a comprehensive MRSA colonization surveillance program. It is not intended to diagnose MRSA infection nor to guide or monitor treatment for MRSA infections.   Culture, blood (routine x 2)     Status: None   Collection Time: 09/28/16  5:57 PM  Result Value Ref Range Status   Specimen Description BLOOD RIGHT ANTECUBITAL  Final   Special Requests Blood  Culture adequate volume IN PEDIATRIC BOTTLE  Final   Culture   Final    NO GROWTH 5 DAYS Performed at [facility], [place] [address] [place], [place] [address]    Report Status 10/03/2016 FINAL  Final  Culture, blood (routine x 2)     Status: None   Collection Time: 09/28/16  7:16 PM  Result Value Ref Range Status   Specimen Description BLOOD LEFT HAND  Final   Special Requests Blood Culture adequate volume IN PEDIATRIC BOTTLE  Final   Culture   Final    NO GROWTH 5 DAYS Performed at [facility], [place] [address] [place], [place] [address]    Report Status 10/03/2016 FINAL  Final         Radiology Studies: Dg Chest Port 1 View  Result Date: 10/06/2016 CLINICAL DATA:  COPD, respiratory failure EXAM: PORTABLE CHEST 1 VIEW COMPARISON:  09/29/2016 FINDINGS: Cardiomediastinal silhouette is stable. Hyperinflation again noted. Persistent left basilar streaky opacification suspicious for pneumonia. There is right base medially atelectasis or infiltrate. No pulmonary edema. IMPRESSION: Hyperinflation again noted. Persistent left basilar streaky opacification suspicious for pneumonia. There is right base medially atelectasis or infiltrate. No pulmonary edema. Electronically Signed   By: Lahoma Crocker M.D.   On: 10/06/2016 11:40        Scheduled Meds: . amLODipine  10 mg Oral Daily  . chlorhexidine  15 mL Mouth Rinse BID  . gabapentin  200 mg Oral Q6H  . heparin  5,000 Units Subcutaneous Q8H  . insulin aspart  0-15 Units Subcutaneous TID WC  . insulin aspart  0-5 Units Subcutaneous QHS  . ipratropium-albuterol  3 mL Nebulization QID  . mouth rinse  15 mL Mouth Rinse q12n4p  . methylPREDNISolone (SOLU-MEDROL) injection  60 mg Intravenous Q12H  . oxybutynin  5 mg Oral Q breakfast  . QUEtiapine  300 mg Oral QHS  . QUEtiapine  50 mg Oral Q breakfast  . theophylline  300 mg Oral BID  . venlafaxine XR  150 mg Oral Q breakfast   Continuous Infusions: . lactated ringers 10 mL/hr  at 10/03/16 1900     LOS: 8 days     Cordelia Poche, MD [facility] 10/06/2016, 3:48 PM Pager: [phone]  If 7PM-7AM, please contact night-coverage [email or web address] Password [facility] 10/06/2016, 3:48 PM

## 2016-10-06 NOTE — Progress Notes (Signed)
[place]adders scan performed earlier, result was 456 cc. Dr. Lonny Prude notified ordered In and Out cth. Obtained 525 cc of clear yellow urine, patient tolerted the procedure. Will continue to monitor.

## 2016-10-07 ENCOUNTER — Inpatient Hospital Stay (HOSPITAL_COMMUNITY): Payer: Medicare Other

## 2016-10-07 DIAGNOSIS — R140 Abdominal distension (gaseous): Secondary | ICD-10-CM

## 2016-10-07 LAB — BLOOD GAS, ARTERIAL
Acid-Base Excess: 7.8 mmol/L — ABNORMAL HIGH (ref 0.0–2.0)
Bicarbonate: 33.8 mmol/L — ABNORMAL HIGH (ref 20.0–28.0)
DRAWN BY: 295031
O2 CONTENT: 9 L/min
O2 SAT: 93 %
PATIENT TEMPERATURE: 98.6
pCO2 arterial: 55.9 mmHg — ABNORMAL HIGH (ref 32.0–48.0)
pH, Arterial: 7.398 (ref 7.350–7.450)
pO2, Arterial: 66.8 mmHg — ABNORMAL LOW (ref 83.0–108.0)

## 2016-10-07 LAB — URINALYSIS, ROUTINE W REFLEX MICROSCOPIC
Bilirubin Urine: NEGATIVE
GLUCOSE, UA: 50 mg/dL — AB
Ketones, ur: NEGATIVE mg/dL
NITRITE: NEGATIVE
PH: 6 (ref 5.0–8.0)
Protein, ur: 100 mg/dL — AB
Specific Gravity, Urine: 1.015 (ref 1.005–1.030)
Squamous Epithelial / LPF: NONE SEEN

## 2016-10-07 LAB — GLUCOSE, CAPILLARY
GLUCOSE-CAPILLARY: 174 mg/dL — AB (ref 65–99)
GLUCOSE-CAPILLARY: 204 mg/dL — AB (ref 65–99)
GLUCOSE-CAPILLARY: 149 mg/dL — AB (ref 65–99)
Glucose-Capillary: 212 mg/dL — ABNORMAL HIGH (ref 65–99)

## 2016-10-07 MED ORDER — POLYETHYLENE GLYCOL 3350 17 G PO PACK
17.0000 g | PACK | Freq: Two times a day (BID) | ORAL | Status: DC
  Administered 2016-10-07 – 2016-10-09 (×5): 17 g via ORAL
  Filled 2016-10-07 (×5): qty 1

## 2016-10-07 NOTE — Progress Notes (Signed)
PROGRESS NOTE    Traci Thomas  JME:[id number] DOB: [birth year] DOA: 09/28/2016 PCP: [name], NP   Brief Narrative: Traci Thomas is a 66 y.o. female with a history of COPD and chronic respiratory failure on 3 L oxygen at home, diabetes mellitus, hypertension. Patient presented with acute respiratory secondary to HCAP and COPD exacerbation   Assessment & Plan:   Active Problems:   COPD exacerbation (HCC)   Acute on chronic respiratory failure with hypoxia (HCC)   Acute respiratory failure (HCC)   Abdominal pain   Sepsis due to pneumonia ([place])   Lobar pneumonia ([place])   Acute metabolic encephalopathy   Acute on chronic respiratory failure with hypoxia Secondary to HCAP and COPD exacerbation. HCAP treated and likely secondary to COPD now. -wean oxygen  HCAP Completed course.  COPD exacerbation Does not appear to be at baseline and still very wheezy with minimal air movement. -continue HFNC and wean -continue solumedrol BID -continue Duonebs -continue theophylline -PT eval -incentive spirometery -flutter valve  Sepsis Initially secondary to HCAP. Physiology resolved.  Acute metabolic encephalopathy Appears resolved  Abdominal pain Improved. Likely secondary to constipation. Repeat KUB today shows stool. Had bowel movement yesterday from enema -miralax -second enema if no improvement today  Hematuria Secondary to trauma from in/out cath. Blood clots present -continue to monitor urine  Diabetes mellitus, type 2 -continue SSI  Essential hypertension -continue amlodipine  Depression Chronic anxiety -continue Effexor -continue alprazolam prn  History of urinary retention -continue Ditropan   DVT prophylaxis: heparin Code Status: DNR/Partial (Intubate and Bipap only) Family Communication: None at bedside Disposition Plan: Discharge pending respiratory improvement   Consultants:   None  Procedures:   None  Antimicrobials:  Vancomycin  (4/11>4/13)  Azithromycin (4/11>>4/17)  Ceftazidime ((4/11>>4/17)    Subjective: Abdominal discomfort. No dyspnea or chest pain.  Objective: Vitals:   10/06/16 2036 10/07/16 0518 10/07/16 0808 10/07/16 1148  BP:  (!) 144/79  (!) 163/82  Pulse:  99  100  Resp:  20    Temp:  98.8 F (37.1 C)  98.6 F (37 C)  TempSrc:  Oral  Oral  SpO2: 93% 100% 91% 95%  Weight:  65.5 kg (144 lb 6.4 oz)    Height:        Intake/Output Summary (Last 24 hours) at 10/07/16 1206 Last data filed at 10/07/16 0917  Gross per 24 hour  Intake           402.83 ml  Output              525 ml  Net          -122.17 ml   Filed Weights   10/04/16 0546 10/05/16 1017 10/07/16 0518  Weight: 60.1 kg (132 lb 7.9 oz) 67.4 kg (148 lb 9.4 oz) 65.5 kg (144 lb 6.4 oz)    Examination:  General exam: Appears calm and comfortable. Chronically ill appearing. Respiratory system: decreased breath sounds, not as much wheezing today with prolonged expiratory phase. Respiratory effort normal. Speaks in full sentences with ease. Cardiovascular system: S1 & S2 heard, RRR. No murmurs. Gastrointestinal system: Abdomen is increasingly distended, soft and nontender. Normal bowel sounds heard. Central nervous system: Alert and oriented. No focal neurological deficits. Extremities: No edema. No calf tenderness Skin: No cyanosis. No rashes Psychiatry: Judgement and insight appear normal. Mood & affect appropriate.     Data Reviewed: I have personally reviewed following labs and imaging studies  CBC:  Recent Labs Lab 10/02/16 0349 10/04/16  0625  WBC 10.2 10.0  HGB 10.8* 10.6*  HCT 34.2* 34.0*  MCV 89.5 90.4  PLT 257 165   Basic Metabolic Panel:  Recent Labs Lab 10/02/16 0349 10/04/16 0625 10/05/16 0705  NA 141 140 138  K 3.5 3.7 4.4  CL 100* 96* 93*  CO2 32 37* 34*  GLUCOSE 124* 162* 144*  BUN 13 18 19   CREATININE 0.80 0.92 0.84  CALCIUM 8.8* 9.0 9.8  MG  --   --  2.1  PHOS  --   --  3.3    GFR: Estimated Creatinine Clearance: 54.2 mL/min (by C-G formula based on SCr of 0.84 mg/dL). Liver Function Tests: No results for input(s): AST, ALT, ALKPHOS, BILITOT, PROT, ALBUMIN in the last 168 hours. No results for input(s): LIPASE, AMYLASE in the last 168 hours. No results for input(s): AMMONIA in the last 168 hours. Coagulation Profile: No results for input(s): INR, PROTIME in the last 168 hours. Cardiac Enzymes: No results for input(s): CKTOTAL, CKMB, CKMBINDEX, TROPONINI in the last 168 hours. BNP (last 3 results) No results for input(s): PROBNP in the last 8760 hours. HbA1C:  Recent Labs  10/05/16 0705  HGBA1C 7.2*   CBG:  Recent Labs Lab 10/06/16 1144 10/06/16 1632 10/06/16 1959 10/07/16 0732 10/07/16 1146  GLUCAP 208* 270* 85 174* 204*   Lipid Profile: No results for input(s): CHOL, HDL, LDLCALC, TRIG, CHOLHDL, LDLDIRECT in the last 72 hours. Thyroid Function Tests:  Recent Labs  10/05/16 0705  TSH 0.435  FREET4 0.68   Anemia Panel:  Recent Labs  10/05/16 0705  VITAMINB12 1,064*   Sepsis Labs: No results for input(s): PROCALCITON, LATICACIDVEN in the last 168 hours.  Recent Results (from the past 240 hour(s))  Blood Culture (routine x 2)     Status: None   Collection Time: 09/28/16  8:56 AM  Result Value Ref Range Status   Specimen Description BLOOD RIGHT ANTECUBITAL  Final   Special Requests   Final    BOTTLES DRAWN AEROBIC AND ANAEROBIC Blood Culture adequate volume   Culture   Final    NO GROWTH 5 DAYS Performed at [facility], [address], [place], [place] [address]    Report Status 10/03/2016 FINAL  Final  Blood Culture (routine x 2)     Status: None   Collection Time: 09/28/16  9:30 AM  Result Value Ref Range Status   Specimen Description BLOOD RIGHT HAND  Final   Special Requests   Final    BOTTLES DRAWN AEROBIC AND ANAEROBIC Blood Culture adequate volume   Culture   Final    NO GROWTH 5 DAYS Performed at [facility], [place] [address] [place], [place] [address]    Report Status 10/03/2016 FINAL  Final  Urine culture     Status: None   Collection Time: 09/28/16  3:30 PM  Result Value Ref Range Status   Specimen Description URINE, RANDOM  Final   Special Requests NONE  Final   Culture   Final    NO GROWTH Performed at [facility], [place] [address], [place], [place] [address]    Report Status 09/30/2016 FINAL  Final  Respiratory Panel by PCR     Status: None   Collection Time: 09/28/16  3:40 PM  Result Value Ref Range Status   Adenovirus NOT DETECTED NOT DETECTED Final   Coronavirus 229E NOT DETECTED NOT DETECTED Final   Coronavirus HKU1 NOT DETECTED NOT DETECTED Final  Coronavirus NL63 NOT DETECTED NOT DETECTED Final   Coronavirus OC43 NOT DETECTED NOT DETECTED Final   Metapneumovirus NOT DETECTED NOT DETECTED Final   Rhinovirus / Enterovirus NOT DETECTED NOT DETECTED Final   Influenza A NOT DETECTED NOT DETECTED Final   Influenza B NOT DETECTED NOT DETECTED Final   Parainfluenza Virus 1 NOT DETECTED NOT DETECTED Final   Parainfluenza Virus 2 NOT DETECTED NOT DETECTED Final   Parainfluenza Virus 3 NOT DETECTED NOT DETECTED Final   Parainfluenza Virus 4 NOT DETECTED NOT DETECTED Final   Respiratory Syncytial Virus NOT DETECTED NOT DETECTED Final   Bordetella pertussis NOT DETECTED NOT DETECTED Final   Chlamydophila pneumoniae NOT DETECTED NOT DETECTED Final   Mycoplasma pneumoniae NOT DETECTED NOT DETECTED Final    Comment: Performed at [facility], [place] [address], [place], [place] [address]  MRSA PCR Screening     Status: None   Collection Time: 09/28/16  4:47 PM  Result Value Ref Range Status   MRSA by PCR NEGATIVE NEGATIVE Final    Comment:        The GeneXpert MRSA Assay (FDA approved for NASAL specimens only), is one component of a comprehensive MRSA colonization surveillance program. It is not intended to diagnose MRSA infection nor to guide or monitor  treatment for MRSA infections.   Culture, blood (routine x 2)     Status: None   Collection Time: 09/28/16  5:57 PM  Result Value Ref Range Status   Specimen Description BLOOD RIGHT ANTECUBITAL  Final   Special Requests Blood Culture adequate volume IN PEDIATRIC BOTTLE  Final   Culture   Final    NO GROWTH 5 DAYS Performed at [facility], [place] [address] [place], [place] [address]    Report Status 10/03/2016 FINAL  Final  Culture, blood (routine x 2)     Status: None   Collection Time: 09/28/16  7:16 PM  Result Value Ref Range Status   Specimen Description BLOOD LEFT HAND  Final   Special Requests Blood Culture adequate volume IN PEDIATRIC BOTTLE  Final   Culture   Final    NO GROWTH 5 DAYS Performed at [facility], [place] [address], [place], [place] [address]    Report Status 10/03/2016 FINAL  Final         Radiology Studies: Dg Abd 1 View  Result Date: 10/07/2016 CLINICAL DATA:  Abdominal distention and discomfort. EXAM: ABDOMEN - 1 VIEW COMPARISON:  10/04/2016 FINDINGS: There is a moderate stool burden within the right colon. No abnormally dilated loops of small bowel or air-fluid levels identified. IMPRESSION: 1. Nonobstructive bowel gas pattern. 2. Moderate stool burden within the colon suggestive of constipation. Electronically Signed   By: Kerby Moors M.D.   On: 10/07/2016 10:58   Dg Chest Port 1 View  Result Date: 10/06/2016 CLINICAL DATA:  COPD, respiratory failure EXAM: PORTABLE CHEST 1 VIEW COMPARISON:  09/29/2016 FINDINGS: Cardiomediastinal silhouette is stable. Hyperinflation again noted. Persistent left basilar streaky opacification suspicious for pneumonia. There is right base medially atelectasis or infiltrate. No pulmonary edema. IMPRESSION: Hyperinflation again noted. Persistent left basilar streaky opacification suspicious for pneumonia. There is right base medially atelectasis or infiltrate. No pulmonary edema. Electronically Signed   By: Lahoma Crocker M.D.   On: 10/06/2016 11:40        Scheduled Meds: . amLODipine  10 mg Oral Daily  . chlorhexidine  15 mL Mouth Rinse BID  . gabapentin  200 mg  Oral Q6H  . heparin  5,000 Units Subcutaneous Q8H  . insulin aspart  0-15 Units Subcutaneous TID WC  . insulin aspart  0-5 Units Subcutaneous QHS  . ipratropium-albuterol  3 mL Nebulization Q6H  . mouth rinse  15 mL Mouth Rinse q12n4p  . methylPREDNISolone (SOLU-MEDROL) injection  60 mg Intravenous Q12H  . oxybutynin  5 mg Oral Q breakfast  . QUEtiapine  300 mg Oral QHS  . QUEtiapine  50 mg Oral Q breakfast  . theophylline  300 mg Oral BID  . venlafaxine XR  150 mg Oral Q breakfast   Continuous Infusions: . lactated ringers 10 mL/hr at 10/03/16 1900     LOS: 9 days     Cordelia Poche, MD [facility] 10/07/2016, 12:06 PM Pager: [phone]  If 7PM-7AM, please contact night-coverage [email or web address] Password [email or web address] 10/07/2016, 12:06 PM

## 2016-10-07 NOTE — Evaluation (Signed)
Physical Therapy Evaluation Patient Details Name: Traci Thomas MRN: [id number] DOB: 23-Jun-1950 Today's Date: 10/07/2016   History of Present Illness  [name] is a 66 y.o. female with a history of COPD and chronic respiratory failure on 3 L oxygen at home, diabetes mellitus, hypertension. Patient presented with acute respiratory secondary to HCAP and COPD exacerbation. PATIENT COMES FROM [facility]  Clinical Impression  The patien is  Transferring well. Unable to assess ambulation this visit due to need for xray.  Patient has been in SNF for some time, it appears. Pt admitted with above diagnosis. Pt currently with functional limitations due to the deficits listed below (see PT Problem List).  Pt will benefit from skilled PT to increase their independence and safety with mobility to allow discharge to the venue listed below.       Follow Up Recommendations SNF;Supervision - Intermittent    Equipment Recommendations  None recommended by PT    Recommendations for Other Services       Precautions / Restrictions Precautions Precautions: Fall Precaution Comments: ON o2      Mobility  Bed Mobility Overal bed mobility: Needs Assistance Bed Mobility: Sit to Supine       Sit to supine: Supervision      Transfers Overall transfer level: Needs assistance Equipment used: 1 person hand held assist Transfers: Sit to/from Stand Sit to Stand: Min assist         General transfer comment: assist to rise from the recliner and pivot to the bed  Ambulation/Gait                Stairs            Wheelchair Mobility    Modified Rankin (Stroke Patients Only)       Balance                                             Pertinent Vitals/Pain Pain Assessment: No/denies pain    Home Living Family/patient expects to be discharged to:: Skilled nursing facility                      Prior Function Level of Independence: Needs  assistance               Hand Dominance        Extremity/Trunk Assessment   Upper Extremity Assessment Upper Extremity Assessment: Generalized weakness    Lower Extremity Assessment Lower Extremity Assessment: Generalized weakness    Cervical / Trunk Assessment Cervical / Trunk Assessment: Kyphotic  Communication   Communication: No difficulties  Cognition Arousal/Alertness: Awake/alert Behavior During Therapy: WFL for tasks assessed/performed Overall Cognitive Status: Within Functional Limits for tasks assessed                                        General Comments      Exercises     Assessment/Plan    PT Assessment Patient needs continued PT services  PT Problem List Decreased strength;Decreased activity tolerance;Decreased mobility;Decreased knowledge of precautions;Decreased safety awareness;Decreased knowledge of use of DME       PT Treatment Interventions DME instruction;Gait training;Functional mobility training;Therapeutic activities;Therapeutic exercise;Patient/family education    PT Goals (Current goals can be found in the Care Plan  section)  Acute Rehab PT Goals Patient Stated Goal: to go home PT Goal Formulation: With patient Time For Goal Achievement: 10/21/16 Potential to Achieve Goals: Good    Frequency Min 2X/week   Barriers to discharge        Co-evaluation               End of Session Equipment Utilized During Treatment: Oxygen Activity Tolerance: Patient tolerated treatment well Patient left: in bed;with call bell/phone within reach Nurse Communication: Mobility status PT Visit Diagnosis: Unsteadiness on feet (R26.81)    Time: 8682-5749 PT Time Calculation (min) (ACUTE ONLY): 10 min   Charges:   PT Evaluation $PT Eval Low Complexity: 1 Procedure     PT G CodesClaretha Cooper 10/07/2016, 1:50 PM Tresa Endo PT [phone]

## 2016-10-07 NOTE — Progress Notes (Signed)
CSW following to assist with patient discharge needs back to [facility], once medically stable.    Kathrin [place], Latanya Presser, MSW Clinical Social Worker 5E and Psychiatric Service Line [phone] 10/07/2016  3:57 PM

## 2016-10-08 ENCOUNTER — Inpatient Hospital Stay (HOSPITAL_COMMUNITY): Payer: Medicare Other

## 2016-10-08 DIAGNOSIS — K5900 Constipation, unspecified: Secondary | ICD-10-CM

## 2016-10-08 LAB — GLUCOSE, CAPILLARY
GLUCOSE-CAPILLARY: 160 mg/dL — AB (ref 65–99)
GLUCOSE-CAPILLARY: 310 mg/dL — AB (ref 65–99)
GLUCOSE-CAPILLARY: 214 mg/dL — AB (ref 65–99)
Glucose-Capillary: 164 mg/dL — ABNORMAL HIGH (ref 65–99)

## 2016-10-08 MED ORDER — MILK AND MOLASSES ENEMA
1.0000 | Freq: Once | RECTAL | Status: AC
  Administered 2016-10-08: 250 mL via RECTAL
  Filled 2016-10-08: qty 250

## 2016-10-08 NOTE — Progress Notes (Signed)
PROGRESS NOTE    Traci Thomas  OZD:[id number] DOB: [birth year] DOA: 09/28/2016 PCP: [name], NP   Brief Narrative: Traci Thomas is a 66 y.o. female with a history of COPD and chronic respiratory failure on 3 L oxygen at home, diabetes mellitus, hypertension. Patient presented with acute respiratory secondary to HCAP and COPD exacerbation   Assessment & Plan:   Active Problems:   COPD exacerbation (HCC)   Acute on chronic respiratory failure with hypoxia (HCC)   Acute respiratory failure (HCC)   Abdominal pain   Sepsis due to pneumonia ([place])   Lobar pneumonia ([place])   Acute metabolic encephalopathy   Acute on chronic respiratory failure with hypoxia Secondary to HCAP and COPD exacerbation. HCAP treated and likely secondary to COPD now. -wean oxygen -pulm consult  HCAP Completed course.  COPD exacerbation Does not appear to be at baseline and still very wheezy with minimal air movement. -continue HFNC and wean -continue solumedrol BID -continue Duonebs -continue theophylline -PT eval: SNF -incentive spirometery -flutter valve  Sepsis Initially secondary to HCAP. Physiology resolved.  Acute metabolic encephalopathy Appears resolved  Abdominal pain Improved. Likely secondary to constipation. -miralax -milk and molasses enema  Hematuria Secondary to trauma from in/out cath. Blood clots present. Cleared today -continue to monitor urine  Diabetes mellitus, type 2 -continue SSI  Essential hypertension -continue amlodipine  Depression Chronic anxiety -continue Effexor -continue alprazolam prn  History of urinary retention -continue Ditropan   DVT prophylaxis: heparin Code Status: DNR/Partial (Intubate and Bipap only) Family Communication: None at bedside Disposition Plan: Discharge pending respiratory improvement   Consultants:   None  Procedures:   None  Antimicrobials:  Vancomycin (4/11>4/13)  Azithromycin  (4/11>>4/17)  Ceftazidime ((4/11>>4/17)    Subjective: Abdominal discomfort. No dyspnea or chest pain. No bowel movement  Objective: Vitals:   10/08/16 0209 10/08/16 0556 10/08/16 0601 10/08/16 0817  BP:   (!) 158/88   Pulse:   (!) 106   Resp:   (!) 24   Temp:   98.6 F (37 C)   TempSrc:   Oral   SpO2: 92%  95% 97%  Weight:  59.2 kg (130 lb 8.2 oz)    Height:        Intake/Output Summary (Last 24 hours) at 10/08/16 1113 Last data filed at 10/08/16 0900  Gross per 24 hour  Intake             1110 ml  Output              800 ml  Net              310 ml   Filed Weights   10/05/16 1017 10/07/16 0518 10/08/16 0556  Weight: 67.4 kg (148 lb 9.4 oz) 65.5 kg (144 lb 6.4 oz) 59.2 kg (130 lb 8.2 oz)    Examination:  General exam: Appears calm and comfortable. Chronically ill appearing. Respiratory system: decreased breath sounds, no wheezing (recent nebulizer treatment). Prolonged expiratory phase. Respiratory effort normal. Speaks in full sentences with ease. Cardiovascular system: S1 & S2 heard, RRR. No murmurs. Gastrointestinal system: Abdomen is distended, soft and nontender. Normal bowel sounds heard. Central nervous system: Alert and oriented. No focal neurological deficits. Extremities: No edema. No calf tenderness Skin: No cyanosis. No rashes Psychiatry: Judgement and insight appear normal. Mood & affect appropriate.     Data Reviewed: I have personally reviewed following labs and imaging studies  CBC:  Recent Labs Lab 10/02/16 0349 10/04/16 0625  WBC  10.2 10.0  HGB 10.8* 10.6*  HCT 34.2* 34.0*  MCV 89.5 90.4  PLT 257 956   Basic Metabolic Panel:  Recent Labs Lab 10/02/16 0349 10/04/16 0625 10/05/16 0705  NA 141 140 138  K 3.5 3.7 4.4  CL 100* 96* 93*  CO2 32 37* 34*  GLUCOSE 124* 162* 144*  BUN 13 18 19   CREATININE 0.80 0.92 0.84  CALCIUM 8.8* 9.0 9.8  MG  --   --  2.1  PHOS  --   --  3.3   GFR: Estimated Creatinine Clearance: 51.5 mL/min  (by C-G formula based on SCr of 0.84 mg/dL). Liver Function Tests: No results for input(s): AST, ALT, ALKPHOS, BILITOT, PROT, ALBUMIN in the last 168 hours. No results for input(s): LIPASE, AMYLASE in the last 168 hours. No results for input(s): AMMONIA in the last 168 hours. Coagulation Profile: No results for input(s): INR, PROTIME in the last 168 hours. Cardiac Enzymes: No results for input(s): CKTOTAL, CKMB, CKMBINDEX, TROPONINI in the last 168 hours. BNP (last 3 results) No results for input(s): PROBNP in the last 8760 hours. HbA1C: No results for input(s): HGBA1C in the last 72 hours. CBG:  Recent Labs Lab 10/07/16 0732 10/07/16 1146 10/07/16 1700 10/07/16 2044 10/08/16 0731  GLUCAP 174* 204* 212* 149* 160*   Lipid Profile: No results for input(s): CHOL, HDL, LDLCALC, TRIG, CHOLHDL, LDLDIRECT in the last 72 hours. Thyroid Function Tests: No results for input(s): TSH, T4TOTAL, FREET4, T3FREE, THYROIDAB in the last 72 hours. Anemia Panel: No results for input(s): VITAMINB12, FOLATE, FERRITIN, TIBC, IRON, RETICCTPCT in the last 72 hours. Sepsis Labs: No results for input(s): PROCALCITON, LATICACIDVEN in the last 168 hours.  Recent Results (from the past 240 hour(s))  Urine culture     Status: None   Collection Time: 09/28/16  3:30 PM  Result Value Ref Range Status   Specimen Description URINE, RANDOM  Final   Special Requests NONE  Final   Culture   Final    NO GROWTH Performed at [facility], [address] [place], [place] [address]    Report Status 09/30/2016 FINAL  Final  Respiratory Panel by PCR     Status: None   Collection Time: 09/28/16  3:40 PM  Result Value Ref Range Status   Adenovirus NOT DETECTED NOT DETECTED Final   Coronavirus 229E NOT DETECTED NOT DETECTED Final   Coronavirus HKU1 NOT DETECTED NOT DETECTED Final   Coronavirus NL63 NOT DETECTED NOT DETECTED Final   Coronavirus OC43 NOT DETECTED NOT DETECTED Final   Metapneumovirus NOT  DETECTED NOT DETECTED Final   Rhinovirus / Enterovirus NOT DETECTED NOT DETECTED Final   Influenza A NOT DETECTED NOT DETECTED Final   Influenza B NOT DETECTED NOT DETECTED Final   Parainfluenza Virus 1 NOT DETECTED NOT DETECTED Final   Parainfluenza Virus 2 NOT DETECTED NOT DETECTED Final   Parainfluenza Virus 3 NOT DETECTED NOT DETECTED Final   Parainfluenza Virus 4 NOT DETECTED NOT DETECTED Final   Respiratory Syncytial Virus NOT DETECTED NOT DETECTED Final   Bordetella pertussis NOT DETECTED NOT DETECTED Final   Chlamydophila pneumoniae NOT DETECTED NOT DETECTED Final   Mycoplasma pneumoniae NOT DETECTED NOT DETECTED Final    Comment: Performed at [facility], [place] [address], [place], [place] [address]  MRSA PCR Screening     Status: None   Collection Time: 09/28/16  4:47 PM  Result Value Ref Range Status   MRSA by PCR NEGATIVE NEGATIVE Final  Comment:        The GeneXpert MRSA Assay (FDA approved for NASAL specimens only), is one component of a comprehensive MRSA colonization surveillance program. It is not intended to diagnose MRSA infection nor to guide or monitor treatment for MRSA infections.   Culture, blood (routine x 2)     Status: None   Collection Time: 09/28/16  5:57 PM  Result Value Ref Range Status   Specimen Description BLOOD RIGHT ANTECUBITAL  Final   Special Requests Blood Culture adequate volume IN PEDIATRIC BOTTLE  Final   Culture   Final    NO GROWTH 5 DAYS Performed at [facility], [place] [address], [place], [place] [address]    Report Status 10/03/2016 FINAL  Final  Culture, blood (routine x 2)     Status: None   Collection Time: 09/28/16  7:16 PM  Result Value Ref Range Status   Specimen Description BLOOD LEFT HAND  Final   Special Requests Blood Culture adequate volume IN PEDIATRIC BOTTLE  Final   Culture   Final    NO GROWTH 5 DAYS Performed at [facility], [place] [address] [place], [place] [address]    Report Status  10/03/2016 FINAL  Final         Radiology Studies: Dg Abd 1 View  Result Date: 10/07/2016 CLINICAL DATA:  Abdominal distention and discomfort. EXAM: ABDOMEN - 1 VIEW COMPARISON:  10/04/2016 FINDINGS: There is a moderate stool burden within the right colon. No abnormally dilated loops of small bowel or air-fluid levels identified. IMPRESSION: 1. Nonobstructive bowel gas pattern. 2. Moderate stool burden within the colon suggestive of constipation. Electronically Signed   By: Kerby Moors M.D.   On: 10/07/2016 10:58   Dg Chest Port 1 View  Result Date: 10/06/2016 CLINICAL DATA:  COPD, respiratory failure EXAM: PORTABLE CHEST 1 VIEW COMPARISON:  09/29/2016 FINDINGS: Cardiomediastinal silhouette is stable. Hyperinflation again noted. Persistent left basilar streaky opacification suspicious for pneumonia. There is right base medially atelectasis or infiltrate. No pulmonary edema. IMPRESSION: Hyperinflation again noted. Persistent left basilar streaky opacification suspicious for pneumonia. There is right base medially atelectasis or infiltrate. No pulmonary edema. Electronically Signed   By: Lahoma Crocker M.D.   On: 10/06/2016 11:40        Scheduled Meds: . amLODipine  10 mg Oral Daily  . chlorhexidine  15 mL Mouth Rinse BID  . gabapentin  200 mg Oral Q6H  . heparin  5,000 Units Subcutaneous Q8H  . insulin aspart  0-15 Units Subcutaneous TID WC  . insulin aspart  0-5 Units Subcutaneous QHS  . ipratropium-albuterol  3 mL Nebulization Q6H  . mouth rinse  15 mL Mouth Rinse q12n4p  . methylPREDNISolone (SOLU-MEDROL) injection  60 mg Intravenous Q12H  . milk and molasses  1 enema Rectal Once  . oxybutynin  5 mg Oral Q breakfast  . polyethylene glycol  17 g Oral BID  . QUEtiapine  300 mg Oral QHS  . QUEtiapine  50 mg Oral Q breakfast  . theophylline  300 mg Oral BID  . venlafaxine XR  150 mg Oral Q breakfast   Continuous Infusions: . lactated ringers 10 mL/hr at 10/07/16 1836     LOS:  10 days     Cordelia Poche, MD [facility] 10/08/2016, 11:13 AM Pager: [phone]  If 7PM-7AM, please contact night-coverage [email or web address] Password [email or web address] 10/08/2016, 11:13 AM

## 2016-10-08 NOTE — Progress Notes (Signed)
Milk and molasses enema given this afternoon -- most of tx came back out of rectum.

## 2016-10-09 LAB — GLUCOSE, CAPILLARY
GLUCOSE-CAPILLARY: 177 mg/dL — AB (ref 65–99)
GLUCOSE-CAPILLARY: 168 mg/dL — AB (ref 65–99)
GLUCOSE-CAPILLARY: 136 mg/dL — AB (ref 65–99)
Glucose-Capillary: 272 mg/dL — ABNORMAL HIGH (ref 65–99)

## 2016-10-09 LAB — SEDIMENTATION RATE: Sed Rate: 22 mm/hr (ref 0–22)

## 2016-10-09 LAB — BRAIN NATRIURETIC PEPTIDE: B NATRIURETIC PEPTIDE 5: 123.7 pg/mL — AB (ref 0.0–100.0)

## 2016-10-09 MED ORDER — SORBITOL 70 % SOLN
960.0000 mL | TOPICAL_OIL | Freq: Once | ORAL | Status: AC
  Administered 2016-10-09: 960 mL via RECTAL
  Filled 2016-10-09: qty 240

## 2016-10-09 MED ORDER — CLONIDINE HCL 0.1 MG PO TABS
0.1000 mg | ORAL_TABLET | Freq: Two times a day (BID) | ORAL | Status: DC
  Administered 2016-10-09 – 2016-10-15 (×13): 0.1 mg via ORAL
  Filled 2016-10-09 (×13): qty 1

## 2016-10-09 MED ORDER — IPRATROPIUM-ALBUTEROL 0.5-2.5 (3) MG/3ML IN SOLN
3.0000 mL | Freq: Four times a day (QID) | RESPIRATORY_TRACT | Status: DC
  Administered 2016-10-10 – 2016-10-13 (×12): 3 mL via RESPIRATORY_TRACT
  Filled 2016-10-09 (×14): qty 3

## 2016-10-09 NOTE — Progress Notes (Signed)
PROGRESS NOTE    Traci Thomas  GXQ:[id number] DOB: 11/27/1950 DOA: 09/28/2016 PCP: [name], NP   Brief Narrative: Traci Thomas is a 66 y.o. female with a history of COPD and chronic respiratory failure on 3 L oxygen at home, diabetes mellitus, hypertension. Patient presented with acute respiratory secondary to HCAP and COPD exacerbation   Assessment & Plan:   Active Problems:   COPD exacerbation (HCC)   Acute on chronic respiratory failure with hypoxia (HCC)   Acute respiratory failure (HCC)   Abdominal pain   Sepsis due to pneumonia ([place])   Lobar pneumonia ([place])   Acute metabolic encephalopathy   Acute on chronic respiratory failure with hypoxia Secondary to HCAP and COPD exacerbation. HCAP treated and likely secondary to COPD now. Still requiring high flow [place] -wean oxygen  -pulmonology recommendations  HCAP Completed course.  COPD exacerbation Patient feels okay, but she is requiring significantly more oxygen than baseline -continue HFNC and wean -continue solumedrol BID -continue Duonebs -continue theophylline -PT eval: SNF -incentive spirometery -flutter valve  Sepsis Initially secondary to HCAP. Physiology resolved.  Acute metabolic encephalopathy Appears resolved  Abdominal pain Improved. Likely secondary to constipation. No improvement with milk and molasses enema -miralax -SMOG enema  Hematuria Secondary to trauma from in/out cath. Blood clots present initially. Resolved. -continue to monitor urine  Diabetes mellitus, type 2 -continue SSI  Essential hypertension -amlodipine discontinued secondary to respiratory failure -watch blood pressure  Depression Chronic anxiety -continue Effexor -continue alprazolam prn  History of urinary retention -continue Ditropan   DVT prophylaxis: heparin Code Status: DNR/Partial (Intubate and Bipap only) Family Communication: None at bedside Disposition Plan: Discharge pending respiratory  improvement   Consultants:   None  Procedures:   None  Antimicrobials:  Vancomycin (4/11>4/13)  Azithromycin (4/11>>4/17)  Ceftazidime ((4/11>>4/17)    Subjective: No chest pain or dyspnea.  Objective: Vitals:   10/09/16 0943 10/09/16 1014 10/09/16 1046 10/09/16 1053  BP:      Pulse: (!) 102  (!) 104   Resp:      Temp:      TempSrc:      SpO2: 91% 94% 96% 99%  Weight:      Height:        Intake/Output Summary (Last 24 hours) at 10/09/16 1109 Last data filed at 10/09/16 0850  Gross per 24 hour  Intake             1030 ml  Output              700 ml  Net              330 ml   Filed Weights   10/07/16 0518 10/08/16 0556 10/09/16 0422  Weight: 65.5 kg (144 lb 6.4 oz) 59.2 kg (130 lb 8.2 oz) 60.1 kg (132 lb 7.9 oz)    Examination:  General exam: Appears calm and comfortable. Chronically ill appearing. Respiratory system: decreased breath sounds, mild expiratory failure with prolonged expiratory phase. Respiratory effort normal. Speaks in full sentences with ease. Cardiovascular system: S1 & S2 heard, RRR. No murmurs. Gastrointestinal system: Abdomen is distended, soft and nontender. Normal bowel sounds heard. Central nervous system: Alert and oriented. No focal neurological deficits. Extremities: No edema. No calf tenderness Skin: No cyanosis. No rashes Psychiatry: Judgement and insight appear normal. Mood & affect anxious and flat.     Data Reviewed: I have personally reviewed following labs and imaging studies  CBC:  Recent Labs Lab 10/04/16 0625  WBC  10.0  HGB 10.6*  HCT 34.0*  MCV 90.4  PLT 284   Basic Metabolic Panel:  Recent Labs Lab 10/04/16 0625 10/05/16 0705  NA 140 138  K 3.7 4.4  CL 96* 93*  CO2 37* 34*  GLUCOSE 162* 144*  BUN 18 19  CREATININE 0.92 0.84  CALCIUM 9.0 9.8  MG  --  2.1  PHOS  --  3.3   GFR: Estimated Creatinine Clearance: 51.9 mL/min (by C-G formula based on SCr of 0.84 mg/dL). Liver Function Tests: No  results for input(s): AST, ALT, ALKPHOS, BILITOT, PROT, ALBUMIN in the last 168 hours. No results for input(s): LIPASE, AMYLASE in the last 168 hours. No results for input(s): AMMONIA in the last 168 hours. Coagulation Profile: No results for input(s): INR, PROTIME in the last 168 hours. Cardiac Enzymes: No results for input(s): CKTOTAL, CKMB, CKMBINDEX, TROPONINI in the last 168 hours. BNP (last 3 results) No results for input(s): PROBNP in the last 8760 hours. HbA1C: No results for input(s): HGBA1C in the last 72 hours. CBG:  Recent Labs Lab 10/08/16 0731 10/08/16 1156 10/08/16 1653 10/08/16 2200 10/09/16 0727  GLUCAP 160* 310* 214* 164* 177*   Lipid Profile: No results for input(s): CHOL, HDL, LDLCALC, TRIG, CHOLHDL, LDLDIRECT in the last 72 hours. Thyroid Function Tests: No results for input(s): TSH, T4TOTAL, FREET4, T3FREE, THYROIDAB in the last 72 hours. Anemia Panel: No results for input(s): VITAMINB12, FOLATE, FERRITIN, TIBC, IRON, RETICCTPCT in the last 72 hours. Sepsis Labs: No results for input(s): PROCALCITON, LATICACIDVEN in the last 168 hours.  No results found for this or any previous visit (from the past 240 hour(s)).       Radiology Studies: Dg Chest 2 View  Result Date: 10/08/2016 CLINICAL DATA:  Patient with shortness of breath. EXAM: CHEST  2 VIEW COMPARISON:  Chest radiograph 10/06/2016 FINDINGS: Patient is rotated to the right. Monitoring leads overlie the patient. Stable cardiac and mediastinal contours. Pulmonary hyperinflation. Unchanged heterogeneous opacities bilateral lung bases, left-greater-than-right. Mid thoracic spine degenerative changes. IMPRESSION: Pulmonary hyperinflation. Persistent left-greater-than-right basilar airspace opacities which may represent pneumonia in the appropriate clinical setting. Followup PA and lateral chest X-ray is recommended in 3-4 weeks following trial of antibiotic therapy to ensure resolution and exclude  underlying malignancy. Electronically Signed   By: Lovey Newcomer M.D.   On: 10/08/2016 13:10        Scheduled Meds: . chlorhexidine  15 mL Mouth Rinse BID  . cloNIDine  0.1 mg Oral BID  . gabapentin  200 mg Oral Q6H  . heparin  5,000 Units Subcutaneous Q8H  . insulin aspart  0-15 Units Subcutaneous TID WC  . insulin aspart  0-5 Units Subcutaneous QHS  . ipratropium-albuterol  3 mL Nebulization Q6H  . mouth rinse  15 mL Mouth Rinse q12n4p  . methylPREDNISolone (SOLU-MEDROL) injection  60 mg Intravenous Q12H  . oxybutynin  5 mg Oral Q breakfast  . polyethylene glycol  17 g Oral BID  . QUEtiapine  300 mg Oral QHS  . QUEtiapine  50 mg Oral Q breakfast  . sorbitol, milk of mag, mineral oil, glycerin (SMOG) enema  960 mL Rectal Once  . theophylline  300 mg Oral BID  . venlafaxine XR  150 mg Oral Q breakfast   Continuous Infusions: . lactated ringers 10 mL/hr at 10/07/16 1836     LOS: 11 days     Cordelia Poche, MD [facility] 10/09/2016, 11:09 AM Pager: [phone]  If 7PM-7AM, please contact night-coverage  [email or web address] Password [email or web address] 10/09/2016, 11:09 AM

## 2016-10-09 NOTE — Progress Notes (Signed)
Pt. enema small amount of solution held before solution started coming back. Enema line removed after solution no longer being held. One small hard lump of stool ejcted when tube removed. Pt states after clean up and sitting on bed pan "can feel more stuck up there I can't push out"

## 2016-10-09 NOTE — Progress Notes (Signed)
PULMONARY / CRITICAL CARE MEDICINE   Name: Traci Thomas MRN: [id number] DOB: May 30, 1951    ADMISSION DATE:  09/28/2016 CONSULTATION DATE:  4/11  REFERRING MD:  tegeler   CHIEF COMPLAINT:  Acute on chronic respiratory failure   HISTORY OF PRESENT ILLNESS:   This is a 66 year old female w/ chronic resp failure in setting of COPD (on theophylline and oxygen at baseline). We last saw her back in may 2017 as a pre-op pulmonary consult after she fractured her right hip. She now resides at a SNF. Presented to [facility] w/ 5-7 d h/o increased fatigue, confusion, increased dry NP cough and worsening shortness of breath. On arrival to ER temp 102.5 HR 139. Room air sats 76%, CXR showed bibasilar airspace disease and initial lactic acid was 1.8. She was placed on NIPPV, administered nebs, solumedrol and abx (azith and roceph). IVFs were infused. Her lactic acid raised to 3.03 in-spite of 65m/kg bolus so fluid challenge was repeated. PCCM asked to admit   SUBJECTIVE:  Sitting in chair eating good breakfast, poor cough mechanics noted  Denies sob even when sats low/ denies cp or difficulty swallowing     VITAL SIGNS: BP 126/83 (BP Location: Left Arm)   Pulse 94   Temp 98.1 F (36.7 C) (Oral)   Resp (!) 21   Ht '4\' 10"'$  (1.473 m)   Wt 132 lb 7.9 oz (60.1 kg)   SpO2 100%   BMI 27.69 kg/m   HEMODYNAMICS:    VENTILATOR SETTINGS:    INTAKE / OUTPUT: I/O last 3 completed shifts: In: 9119[P.O.:850; I.V.:94] Out: 1600 [Urine:1600]  Wt Readings from Last 3 Encounters:  10/09/16 132 lb 7.9 oz (60.1 kg)  10/21/15 80 lb 14.5 oz (36.7 kg)  03/25/15 88 lb 6.5 oz (40.1 kg)    Vital signs reviewed   Gen:      No acute distress HEENT:  EOMI, sclera anicteric Neck:     No masses; no thyromegaly Lungs:   Min insp and exp rhonchi bilaterally  CV:         Regular rate and rhythm; no murmurs Abd:      + bowel sounds; soft, non-tender; no palpable masses, no distension Ext:    No edema; adequate  peripheral perfusion Skin:      Warm and dry; no rash Neuro: alert and oriented x 3 but extremely hard of hearing, difficult communicating details of care     LABS:  BMET  Recent Labs Lab 10/04/16 0625 10/05/16 0705  NA 140 138  K 3.7 4.4  CL 96* 93*  CO2 37* 34*  BUN 18 19  CREATININE 0.92 0.84  GLUCOSE 162* 144*    Electrolytes  Recent Labs Lab 10/04/16 0625 10/05/16 0705  CALCIUM 9.0 9.8  MG  --  2.1  PHOS  --  3.3    CBC  Recent Labs Lab 10/04/16 0625  WBC 10.0  HGB 10.6*  HCT 34.0*  PLT 265    Coag's No results for input(s): APTT, INR in the last 168 hours.  Sepsis Markers No results for input(s): LATICACIDVEN, PROCALCITON, O2SATVEN in the last 168 hours.  ABG  Recent Labs Lab 10/07/16 1105  PHART 7.398  PCO2ART 55.9*  PO2ART 66.8*    Liver Enzymes No results for input(s): AST, ALT, ALKPHOS, BILITOT, ALBUMIN in the last 168 hours.  Cardiac Enzymes No results for input(s): TROPONINI, PROBNP in the last 168 hours.  Glucose  Recent Labs Lab 10/07/16 2044 10/08/16  7793 10/08/16 1156 10/08/16 1653 10/08/16 2200 10/09/16 0727  GLUCAP 149* 160* 310* 214* 164* 177*    Imaging/reviewed Dg Chest 2 View  Result Date: 10/08/2016 CLINICAL DATA:  Patient with shortness of breath. EXAM: CHEST  2 VIEW COMPARISON:  Chest radiograph 10/06/2016 FINDINGS: Patient is rotated to the right. Monitoring leads overlie the patient. Stable cardiac and mediastinal contours. Pulmonary hyperinflation. Unchanged heterogeneous opacities bilateral lung bases, left-greater-than-right. Mid thoracic spine degenerative changes. IMPRESSION: Pulmonary hyperinflation. Persistent left-greater-than-right basilar airspace opacities which may represent pneumonia in the appropriate clinical setting. Followup PA and lateral chest X-ray is recommended in 3-4 weeks following trial of antibiotic therapy to ensure resolution and exclude underlying malignancy. Electronically Signed    By: Lovey Newcomer M.D.   On: 10/08/2016 13:10    Chest x-ray 4/11- hyperinflation, bibasilar opacities Chest x-ray 4/12-persistent bibasilar opacities Previously reviewed by me  STUDIES:   CULTURES: resp viral panel 4/11>> Negative UC 4/11>>> negative BCX2 4/11>>> final neg x 2  U strep 4/11>>> Negative U legionella 4/11>> neg   ANTIBIOTICS: vanc 4/11>>> 4/13 Tamiflu 4/11 >>> 4/13 fortaz 4/11>>> 4/17  azith 4/11>>> 4/17  SIGNIFICANT EVENTS:  DISCUSSION: 66 year old with advanced COPD admitted with acute on chronic respiratory failure secondary to age, COPD exacerbation/ probably bilateral HCAP  ASSESSMENT / PLAN: Acute on chronic Hypoxic/hypercarbic  respiratory failure HCAP AECOPD Plan - Continue high flow nasal cannula, wean down as tolerated  With goal of >88% since hypercarbic -  Continue flutter valve/ check bnp/esr ratio    Severe sepsis, resolved  Lactic acidosis, peaked at 5.2. Now resolved Plan  No further rx   Hypertension Plan D/c  Amlodipine as has potential to worsen v/q mismatch esp in high doses   Abdominal pain, distention. Suspect aerophagia on BiPAP +/- constipation Plan Eating fine/ no further w/u needed   Acute encephalopathy  Likely some degree of baseline dementia Chronic anxiety Plan rx per [facility]  Anemia of chronic disease Plan rx per [facility]   DM w/ hyperglycemia Plan rx per [facility]  Hx urinary retention Plan Continue ditropan    Christinia Gully, MD Pulmonary and [place] [phone] After 5:30 PM or weekends, use [facility] [phone]
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
## 2016-10-10 DIAGNOSIS — R0902 Hypoxemia: Secondary | ICD-10-CM

## 2016-10-10 LAB — GLUCOSE, CAPILLARY
GLUCOSE-CAPILLARY: 162 mg/dL — AB (ref 65–99)
Glucose-Capillary: 128 mg/dL — ABNORMAL HIGH (ref 65–99)
Glucose-Capillary: 205 mg/dL — ABNORMAL HIGH (ref 65–99)
Glucose-Capillary: 187 mg/dL — ABNORMAL HIGH (ref 65–99)

## 2016-10-10 MED ORDER — SORBITOL 70 % SOLN
960.0000 mL | TOPICAL_OIL | Freq: Once | ORAL | Status: AC
  Administered 2016-10-10: 960 mL via RECTAL
  Filled 2016-10-10: qty 240

## 2016-10-10 MED ORDER — POLYETHYLENE GLYCOL 3350 17 G PO PACK
17.0000 g | PACK | ORAL | Status: DC
  Administered 2016-10-10 – 2016-10-11 (×6): 17 g via ORAL
  Filled 2016-10-10 (×6): qty 1

## 2016-10-10 NOTE — Care Management Important Message (Signed)
Important Message  Patient Details  Name: Traci Thomas MRN: [id number] Date of Birth: 1951/03/28   Medicare Important Message Given:  Yes    Kerin Salen 10/10/2016, 11:33 AMImportant Message  Patient Details  Name: Traci Thomas MRN: [id number] Date of Birth: 1951-03-06   Medicare Important Message Given:  Yes    Kerin Salen 10/10/2016, 11:33 AM

## 2016-10-10 NOTE — Progress Notes (Signed)
Patient refused to walk in hall after multiple attempts by staff this shift.

## 2016-10-10 NOTE — Progress Notes (Signed)
Date:  October 10, 2016 Chart reviewed for concurrent status and case management needs. Will continue to follow patient progress. Discharge Planning: following for needs Expected discharge date: [id number] Laderius Valbuena, BSN, RN3, CCM   [phone] 

## 2016-10-10 NOTE — Progress Notes (Signed)
CSW updated SNF- [facility]. Will continue to follow and assist with discharge back to SNF, once medically stable.    Kathrin Greathouse, Latanya Presser, MSW Clinical Social Worker 5E and Psychiatric Service Line [phone] 10/10/2016  10:12 AM
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
## 2016-10-11 LAB — GLUCOSE, CAPILLARY
GLUCOSE-CAPILLARY: 124 mg/dL — AB (ref 65–99)
GLUCOSE-CAPILLARY: 361 mg/dL — AB (ref 65–99)
Glucose-Capillary: 198 mg/dL — ABNORMAL HIGH (ref 65–99)
Glucose-Capillary: 216 mg/dL — ABNORMAL HIGH (ref 65–99)

## 2016-10-11 MED ORDER — POLYETHYLENE GLYCOL 3350 17 G PO PACK
17.0000 g | PACK | Freq: Two times a day (BID) | ORAL | Status: DC
  Administered 2016-10-13: 17 g via ORAL
  Filled 2016-10-11 (×4): qty 1

## 2016-10-11 MED ORDER — FUROSEMIDE 20 MG PO TABS
20.0000 mg | ORAL_TABLET | Freq: Once | ORAL | Status: AC
  Administered 2016-10-11: 20 mg via ORAL
  Filled 2016-10-11: qty 1

## 2016-10-11 MED ORDER — PREDNISONE 20 MG PO TABS
40.0000 mg | ORAL_TABLET | Freq: Every day | ORAL | Status: DC
  Administered 2016-10-12 – 2016-10-13 (×2): 40 mg via ORAL
  Filled 2016-10-11 (×2): qty 2

## 2016-10-11 NOTE — Progress Notes (Signed)
SATURATION QUALIFICATIONS: (This note is used to comply with regulatory documentation for home oxygen)  Patient Saturations on Room Air at Rest =  Patient Saturations on Room Air while Ambulating =   Patient Saturations on 5 Liters of oxygen while Ambulating = 78%  Please briefly explain why patient needs home oxygen: pt ambulating on 5L at 78%. Pt 81% on  5L at rest.

## 2016-10-11 NOTE — Progress Notes (Signed)
PULMONARY / CRITICAL CARE MEDICINE   Name: Traci Thomas MRN: [id number] DOB: 03-11-1951    ADMISSION DATE:  09/28/2016 CONSULTATION DATE:  4/11  REFERRING MD:  tegeler   CHIEF COMPLAINT:  Acute on chronic respiratory failure   HISTORY OF PRESENT ILLNESS:   This is a 66 year old female w/ chronic resp failure in setting of COPD (on theophylline and oxygen at baseline). We last saw her back in may 2017 as a pre-op pulmonary consult after she fractured her right hip. She now resides at a SNF. Presented to [facility] w/ 5-7 d h/o increased fatigue, confusion, increased dry NP cough and worsening shortness of breath. On arrival to ER temp 102.5 HR 139. Room air sats 76%, CXR showed bibasilar airspace disease and initial lactic acid was 1.8. She was placed on NIPPV, administered nebs, solumedrol and abx (azith and roceph). IVFs were infused. Her lactic acid raised to 3.03 in-spite of 44ml/kg bolus so fluid challenge was repeated. PCCM asked to admit   SUBJECTIVE:  Breathing better. O2 at 6 l/m [place]     VITAL SIGNS: BP 131/74 (BP Location: Left Arm)   Pulse 94   Temp 98.9 F (37.2 C) (Oral)   Resp 18   Ht 4\' 10"  (1.473 m)   Wt 126 lb 15.8 oz (57.6 kg)   SpO2 91%   BMI 26.54 kg/m   HEMODYNAMICS:    VENTILATOR SETTINGS:    INTAKE / OUTPUT: I/O last 3 completed shifts: In: 840 [P.O.:840] Out: -   Wt Readings from Last 3 Encounters:  10/11/16 126 lb 15.8 oz (57.6 kg)  10/21/15 80 lb 14.5 oz (36.7 kg)  03/25/15 88 lb 6.5 oz (40.1 kg)    General:  Disheveled female NAD at rest HEENT: MM pink/moist PSY:Nl affect but HOH Neuro: Follows commands, speech clear  CV: s1s2 rrr, no m/r/g PULM: even/non-labored, lungs bilaterally diminished  VP:XTGG, non-tender, bsx4 active  Extremities: warm/dry, neg edema  Skin: no rashes or lesions        LABS:  BMET  Recent Labs Lab 10/05/16 0705  NA 138  K 4.4  CL 93*  CO2 34*  BUN 19  CREATININE 0.84  GLUCOSE 144*     Electrolytes  Recent Labs Lab 10/05/16 0705  CALCIUM 9.8  MG 2.1  PHOS 3.3    CBC No results for input(s): WBC, HGB, HCT, PLT in the last 168 hours.  Coag's No results for input(s): APTT, INR in the last 168 hours.  Sepsis Markers No results for input(s): LATICACIDVEN, PROCALCITON, O2SATVEN in the last 168 hours.  ABG  Recent Labs Lab 10/07/16 1105  PHART 7.398  PCO2ART 55.9*  PO2ART 66.8*    Liver Enzymes No results for input(s): AST, ALT, ALKPHOS, BILITOT, ALBUMIN in the last 168 hours.  Cardiac Enzymes No results for input(s): TROPONINI, PROBNP in the last 168 hours.  Glucose  Recent Labs Lab 10/09/16 2134 10/10/16 0738 10/10/16 1130 10/10/16 1648 10/10/16 2100 10/11/16 0800  GLUCAP 136* 128* 162* 205* 187* 124*    Imaging/reviewed No results found.  Chest x-ray 4/11- hyperinflation, bibasilar opacities Chest x-ray 4/12-persistent bibasilar opacities Previously reviewed by me  STUDIES:   CULTURES: resp viral panel 4/11>> Negative UC 4/11>>> negative BCX2 4/11>>> final neg x 2  U strep 4/11>>> Negative U legionella 4/11>> neg   ANTIBIOTICS: vanc 4/11>>> 4/13 Tamiflu 4/11 >>> 4/13 fortaz 4/11>>> 4/17  azith 4/11>>> 4/17  SIGNIFICANT EVENTS:  DISCUSSION: 66 year old with advanced COPD admitted with acute on  chronic respiratory failure secondary to age, COPD exacerbation/ probably bilateral HCAP  ASSESSMENT / PLAN: Acute on chronic Hypoxic/hypercarbic  respiratory failure, baseline o2 NEEDS IS 3 L/M [place]  HCAP AECOPD Plan - Continue high flow nasal cannula, wean down as tolerated  With goal of >88% since hypercarbic. 4/24 , DOWN TO 6 L/M . NEED TO DECREASE PER ORDERS. -  Continue flutter valve -Wean O2 as tolerated to baseline 3 l/m    Severe sepsis, resolved  Lactic acidosis, peaked at 5.2. Now resolved Plan  No further rx   Hypertension Plan D/c  Amlodipine as has potential to worsen v/q mismatch esp in high doses    Abdominal pain, distention. Suspect aerophagia on BiPAP +/- constipation Plan Eating fine/ no further w/u needed   Acute encephalopathy (resolved) Very HOH, may account for miscommunications  Likely some degree of baseline dementia Chronic anxiety Plan rx per [facility]  Anemia of chronic disease Plan rx per [facility]   DM w/ hyperglycemia Plan rx per [facility]  Hx urinary retention Plan Continue ditropan per primary  PCCM will be available PRN.   Richardson Landry Kmari Halter ACNP Maryanna Shape PCCM Pager [phone] till 3 pm If no answer page [phone] 10/11/2016, 10:55 AM

## 2016-10-11 NOTE — Progress Notes (Signed)
PT Cancellation Note  Patient Details Name: MAKALEY STORTS MRN: [id number] DOB: 1950-07-07   Cancelled Treatment:    Reason Eval/Treat Not Completed: Fatigue/lethargy limiting ability to participate, eating dinner, walked with nursing earlier. plabns return to SNF.   [place] PT [phone]  Claretha Cooper 10/11/2016, 6:11 PM

## 2016-10-11 NOTE — Progress Notes (Signed)
PROGRESS NOTE    [name]  AST:[id number] DOB: [birth year] DOA: 09/28/2016 PCP: [name], NP   Brief Narrative: Traci Thomas is a 66 y.o. female with a history of COPD and chronic respiratory failure on 3 L oxygen at home, diabetes mellitus, hypertension. Patient presented with acute respiratory secondary to HCAP and COPD exacerbation   Assessment & Plan:   Active Problems:   COPD exacerbation (HCC)   Acute on chronic respiratory failure with hypoxia (HCC)   Acute respiratory failure (HCC)   Abdominal pain   Sepsis due to pneumonia ([place])   Lobar pneumonia ([place])   Acute metabolic encephalopathy   Acute on chronic respiratory failure with hypoxia Secondary to HCAP and COPD exacerbation. HCAP treated and likely secondary to COPD now. Now on nasal canula -pulmonology recommendations: wean to baseline  HCAP Completed course.  COPD exacerbation -continue [place] and wean as above -prednisone to start tomorrow. Taper at discharge -continue Duonebs -continue theophylline -PT eval: SNF -incentive spirometery -flutter valve  Sepsis Initially secondary to HCAP. Physiology resolved.  Acute metabolic encephalopathy Appears resolved  Abdominal pain Improvement in pain with multiple bowel movements yesterday.  -Miralax BID  Hematuria Secondary to trauma from in/out cath. Blood clots present initially. Resolved.  Diabetes mellitus, type 2 -continue SSI  Essential hypertension Has been hypertensive but improved this morning. -amlodipine discontinued secondary to respiratory failure -watch blood pressure  Depression Chronic anxiety -continue Effexor -continue alprazolam prn  History of urinary retention -continue Ditropan   DVT prophylaxis: heparin Code Status: DNR/Partial (Intubate and Bipap only) Family Communication: None at bedside Disposition Plan: Discharge pending respiratory improvement   Consultants:   None  Procedures:    None  Antimicrobials:  Vancomycin (4/11>4/13)  Azithromycin (4/11>>4/17)  Ceftazidime ((4/11>>4/17)    Subjective: Continued abdominal discomfort that has improved.  Objective: Vitals:   10/11/16 0425 10/11/16 0500 10/11/16 0809 10/11/16 1003  BP: (!) 156/91   131/74  Pulse: (!) 111   94  Resp: 18     Temp: 98.9 F (37.2 C)     TempSrc: Oral     SpO2: 92%  91%   Weight:  57.6 kg (126 lb 15.8 oz)    Height:        Intake/Output Summary (Last 24 hours) at 10/11/16 1214 Last data filed at 10/11/16 1030  Gross per 24 hour  Intake              600 ml  Output                0 ml  Net              600 ml   Filed Weights   10/09/16 0422 10/10/16 0613 10/11/16 0500  Weight: 60.1 kg (132 lb 7.9 oz) 59 kg (130 lb 1.1 oz) 57.6 kg (126 lb 15.8 oz)    Examination:  General exam: Appears calm and comfortable. Chronically ill appearing. Respiratory system: decreased breath sounds, mild expiratory failure with prolonged expiratory phase. Respiratory effort normal. Speaks in full sentences with ease. Cardiovascular system: S1 & S2 heard, RRR. No murmurs. Gastrointestinal system: Abdomen is distended but less so, soft and minimally tender. Normal bowel sounds heard. Central nervous system: Alert and oriented. No focal neurological deficits. Extremities: No edema. No calf tenderness Skin: No cyanosis. No rashes Psychiatry: Judgement and insight appear normal. Mood & affect anxious and flat. Teary today.    Data Reviewed: I have personally reviewed following labs and imaging studies  CBC: No results for input(s): WBC, NEUTROABS, HGB, HCT, MCV, PLT in the last 168 hours. Basic Metabolic Panel:  Recent Labs Lab 10/05/16 0705  NA 138  K 4.4  CL 93*  CO2 34*  GLUCOSE 144*  BUN 19  CREATININE 0.84  CALCIUM 9.8  MG 2.1  PHOS 3.3   GFR: Estimated Creatinine Clearance: 50.8 mL/min (by C-G formula based on SCr of 0.84 mg/dL). Liver Function Tests: No results for  input(s): AST, ALT, ALKPHOS, BILITOT, PROT, ALBUMIN in the last 168 hours. No results for input(s): LIPASE, AMYLASE in the last 168 hours. No results for input(s): AMMONIA in the last 168 hours. Coagulation Profile: No results for input(s): INR, PROTIME in the last 168 hours. Cardiac Enzymes: No results for input(s): CKTOTAL, CKMB, CKMBINDEX, TROPONINI in the last 168 hours. BNP (last 3 results) No results for input(s): PROBNP in the last 8760 hours. HbA1C: No results for input(s): HGBA1C in the last 72 hours. CBG:  Recent Labs Lab 10/10/16 1130 10/10/16 1648 10/10/16 2100 10/11/16 0800 10/11/16 1154  GLUCAP 162* 205* 187* 124* 361*   Lipid Profile: No results for input(s): CHOL, HDL, LDLCALC, TRIG, CHOLHDL, LDLDIRECT in the last 72 hours. Thyroid Function Tests: No results for input(s): TSH, T4TOTAL, FREET4, T3FREE, THYROIDAB in the last 72 hours. Anemia Panel: No results for input(s): VITAMINB12, FOLATE, FERRITIN, TIBC, IRON, RETICCTPCT in the last 72 hours. Sepsis Labs: No results for input(s): PROCALCITON, LATICACIDVEN in the last 168 hours.  No results found for this or any previous visit (from the past 240 hour(s)).       Radiology Studies: No results found.      Scheduled Meds: . chlorhexidine  15 mL Mouth Rinse BID  . cloNIDine  0.1 mg Oral BID  . gabapentin  200 mg Oral Q6H  . heparin  5,000 Units Subcutaneous Q8H  . insulin aspart  0-15 Units Subcutaneous TID WC  . insulin aspart  0-5 Units Subcutaneous QHS  . ipratropium-albuterol  3 mL Nebulization QID  . mouth rinse  15 mL Mouth Rinse q12n4p  . methylPREDNISolone (SOLU-MEDROL) injection  60 mg Intravenous Q12H  . oxybutynin  5 mg Oral Q breakfast  . polyethylene glycol  17 g Oral Q4H  . QUEtiapine  300 mg Oral QHS  . QUEtiapine  50 mg Oral Q breakfast  . theophylline  300 mg Oral BID  . venlafaxine XR  150 mg Oral Q breakfast   Continuous Infusions: . lactated ringers 10 mL/hr at 10/07/16  1836     LOS: 13 days     Cordelia Poche, MD [facility] 10/11/2016, 12:14 PM Pager: [phone]  If 7PM-7AM, please contact night-coverage [email or web address] Password [facility] 10/11/2016, 12:14 PM

## 2016-10-11 NOTE — Progress Notes (Signed)
Pt having an anxiety attack.  Resp treatment given, anti anxiety med given, pt reassured and instructed in deep breathing, staying still until feels better. Traci Thomas P [name]

## 2016-10-11 NOTE — Progress Notes (Signed)
CSW met with patient and son at bedside. CSW helped son explain to patient PT is recommending SNF because it is difficult to ambulate and complete her own ADL's. Patient is agreeable to return to [facility] for PT and hopefully discharge home with son afterwards. CSW contacted [facility] admissions, they are ready for patient.  Patient will transport by [facility].   Kathrin Greathouse, Latanya Presser, MSW Clinical Social Worker 5E and Psychiatric Service Line [phone] 10/11/2016  12:18 PM

## 2016-10-12 ENCOUNTER — Other Ambulatory Visit (HOSPITAL_COMMUNITY): Payer: Medicare Other

## 2016-10-12 DIAGNOSIS — J438 Other emphysema: Secondary | ICD-10-CM

## 2016-10-12 DIAGNOSIS — I10 Essential (primary) hypertension: Secondary | ICD-10-CM

## 2016-10-12 DIAGNOSIS — F329 Major depressive disorder, single episode, unspecified: Secondary | ICD-10-CM

## 2016-10-12 DIAGNOSIS — I5033 Acute on chronic diastolic (congestive) heart failure: Secondary | ICD-10-CM

## 2016-10-12 LAB — GLUCOSE, CAPILLARY
GLUCOSE-CAPILLARY: 114 mg/dL — AB (ref 65–99)
GLUCOSE-CAPILLARY: 328 mg/dL — AB (ref 65–99)
Glucose-Capillary: 231 mg/dL — ABNORMAL HIGH (ref 65–99)
Glucose-Capillary: 219 mg/dL — ABNORMAL HIGH (ref 65–99)

## 2016-10-12 MED ORDER — ENSURE ENLIVE PO LIQD
237.0000 mL | Freq: Two times a day (BID) | ORAL | Status: DC
  Administered 2016-10-12 – 2016-10-17 (×6): 237 mL via ORAL

## 2016-10-12 NOTE — Progress Notes (Signed)
PROGRESS NOTE    Traci Thomas  VXB:[id number] DOB: September 09, 1950 DOA: 09/28/2016 PCP: [name], NP    Brief Narrative:  66 yo female with history of COPD presented with the chief complain of worsening dyspnea. Worsening symptoms for the last 5 to 7 days, associate with confusion, fatigue and cough. On the initial examination patient in respiratory distress with oxygen saturation 76%, tachycardic (139) and febrile (102.5). Significant wheezing, with poor air movement and bilateral rales, more right than left. Chest film with bilateral infiltrates. Admitted to the ICU with sepsis due to health care associated pneumonia. Placed on non invasive mechanical ventilation that tolerated well. Found with volume overload and was treated with diuresis.    Assessment & Plan:   Active Problems:   COPD exacerbation (HCC)   Acute on chronic respiratory failure with hypoxia (HCC)   Acute respiratory failure (HCC)   Abdominal pain   Sepsis due to pneumonia ([place])   Lobar pneumonia ([place])   Acute metabolic encephalopathy   1. Sepsis due to health care associated pneumonia (present on admission). Patient on ceftazidime for 7 days. Chest film personally reviewed with left base infiltrate with signs of hyperinflation, will continue oxymetry monitoring, supplemental 02 per [place], bronchodilator therapy and chest pt.   2. Hypoxic respiratory failure. Patient responded well to diuresis, will keep negative fluid balance, will continue oxymetry monitoring and supplemental 02 per [place].   3 Acute on chronic systolic heart failure decompensation. Will continue to target negative fluid balance, will continue furosemide as needed. Blood pressure control with clonidine    4. COPD flare. Continue systemic steroids, bronchodilator therapy.   5. T2DM. Glucose cover and monitoring with iss, capillary glucose 216, 114, 213.   6. HTN. Will continue clonidine for blood pressure control, systolic 330   7. Depression. Continue  seroquel and venlafaxine.    DVT prophylaxis: enoxaparin  Code Status: Partial (no cpr and no acls) Family Communication: No family at the bedside  Disposition Plan:   Consultants:   Pulmonary    Procedures:  Antimicrobials:    Subjective: Patient still with significant dyspnea, congestion and wheezing, no nausea or vomiting, no chest pain. Has been confused per her son at the bedside. Very weak and deconditioned.   Objective: Vitals:   10/11/16 1704 10/11/16 1924 10/11/16 1954 10/12/16 0418  BP:   (!) 162/98 121/71  Pulse:   98 95  Resp:   20 16  Temp:   98.4 F (36.9 C) 98.2 F (36.8 C)  TempSrc:   Oral Oral  SpO2: (!) 86% (!) 85% (!) 85% 92%  Weight:    57.8 kg (127 lb 6.8 oz)  Height:        Intake/Output Summary (Last 24 hours) at 10/12/16 0945 Last data filed at 10/11/16 1815  Gross per 24 hour  Intake              480 ml  Output                0 ml  Net              480 ml   Filed Weights   10/10/16 0613 10/11/16 0500 10/12/16 0418  Weight: 59 kg (130 lb 1.1 oz) 57.6 kg (126 lb 15.8 oz) 57.8 kg (127 lb 6.8 oz)    Examination:  General exam: deconditioned and ill looking appearing E ENT: mild pallor, oral mucosa dry, no JVD  Respiratory system: Positive diffuse bilateral rhonchi, wheezing and rales, decrease  air movement.  Cardiovascular system: S1 & S2 heard, RRR. No JVD, murmurs, rubs, gallops or clicks. No pedal edema. Gastrointestinal system: Abdomen is nondistended, soft and nontender. No organomegaly or masses felt. Normal bowel sounds heard. Central nervous system: Alert and oriented. No focal neurological deficits. Extremities: Symmetric 5 x 5 power. Skin: No rashes, lesions or ulcers     Data Reviewed: I have personally reviewed following labs and imaging studies  CBC: No results for input(s): WBC, NEUTROABS, HGB, HCT, MCV, PLT in the last 168 hours. Basic Metabolic Panel: No results for input(s): NA, K, CL, CO2, GLUCOSE, BUN,  CREATININE, CALCIUM, MG, PHOS in the last 168 hours. GFR: Estimated Creatinine Clearance: 50.9 mL/min (by C-G formula based on SCr of 0.84 mg/dL). Liver Function Tests: No results for input(s): AST, ALT, ALKPHOS, BILITOT, PROT, ALBUMIN in the last 168 hours. No results for input(s): LIPASE, AMYLASE in the last 168 hours. No results for input(s): AMMONIA in the last 168 hours. Coagulation Profile: No results for input(s): INR, PROTIME in the last 168 hours. Cardiac Enzymes: No results for input(s): CKTOTAL, CKMB, CKMBINDEX, TROPONINI in the last 168 hours. BNP (last 3 results) No results for input(s): PROBNP in the last 8760 hours. HbA1C: No results for input(s): HGBA1C in the last 72 hours. CBG:  Recent Labs Lab 10/11/16 0800 10/11/16 1154 10/11/16 1704 10/11/16 1957 10/12/16 0727  GLUCAP 124* 361* 198* 216* 114*   Lipid Profile: No results for input(s): CHOL, HDL, LDLCALC, TRIG, CHOLHDL, LDLDIRECT in the last 72 hours. Thyroid Function Tests: No results for input(s): TSH, T4TOTAL, FREET4, T3FREE, THYROIDAB in the last 72 hours. Anemia Panel: No results for input(s): VITAMINB12, FOLATE, FERRITIN, TIBC, IRON, RETICCTPCT in the last 72 hours. Sepsis Labs: No results for input(s): PROCALCITON, LATICACIDVEN in the last 168 hours.  No results found for this or any previous visit (from the past 240 hour(s)).       Radiology Studies: No results found.      Scheduled Meds: . chlorhexidine  15 mL Mouth Rinse BID  . cloNIDine  0.1 mg Oral BID  . gabapentin  200 mg Oral Q6H  . heparin  5,000 Units Subcutaneous Q8H  . insulin aspart  0-15 Units Subcutaneous TID WC  . insulin aspart  0-5 Units Subcutaneous QHS  . ipratropium-albuterol  3 mL Nebulization QID  . mouth rinse  15 mL Mouth Rinse q12n4p  . oxybutynin  5 mg Oral Q breakfast  . polyethylene glycol  17 g Oral BID  . predniSONE  40 mg Oral Q breakfast  . QUEtiapine  300 mg Oral QHS  . QUEtiapine  50 mg Oral Q  breakfast  . theophylline  300 mg Oral BID  . venlafaxine XR  150 mg Oral Q breakfast   Continuous Infusions: . lactated ringers 10 mL/hr at 10/07/16 1836     LOS: 14 days      Mauricio Gerome Apley, MD [facility] Pager [phone]  If 7PM-7AM, please contact night-coverage [email or web address] Password [facility] 10/12/2016, 9:45 AM

## 2016-10-12 NOTE — Progress Notes (Signed)
CSW met with patient at bedside by request. Patient inquired about room nukber she will go to at [facility] facility because she is concerned she will get a roommate that she does not like. Patient reports concern about relationship with her son since being admitted to hospital and rehab. CSW listened and provided emotional support.    Kathrin Greathouse, Latanya Presser, MSW Clinical Social Worker 5E and Psychiatric Service Line [phone] 10/12/2016  1:31 PM

## 2016-10-12 NOTE — Progress Notes (Signed)
Initial Nutrition Assessment  DOCUMENTATION CODES:   Not applicable  INTERVENTION:   Ensure Enlive po BID, each supplement provides 350 kcal and 20 grams of protein  NUTRITION DIAGNOSIS:   Increased nutrient needs related to catabolic illness as evidenced by increased estimated needs from protein.  GOAL:   Patient will meet greater than or equal to 90% of their needs  MONITOR:   PO intake, Supplement acceptance, Labs, Weight trends  REASON FOR ASSESSMENT:   LOS    ASSESSMENT:   66 y.o. female with a history of COPD and chronic respiratory failure on 3 L oxygen at home, diabetes mellitus, hypertension. Patient presented with acute respiratory secondary to HCAP and COPD exacerbation   Met with pt in room today. Pt reports intermittent poor appetite pta but reports that she is not a big eater at home. Pt currently eating 75-100% meals. Per chart, pt with weight gain. Pt like Ensure; RD will order.   Medications reviewed and include: heparin, insulin, miralax, prednisone, hydrocodone   Labs reviewed: no recent labs  Nutrition-Focused physical exam completed. Findings are no fat depletion, no muscle depletion, and mild edema.   Diet Order:  Diet Carb Modified Fluid consistency: Thin; Room service appropriate? Yes  Skin:  Reviewed, no issues  Last BM:  4/23  Height:   Ht Readings from Last 1 Encounters:  09/28/16 '4\' 10"'$  (1.473 m)    Weight:   Wt Readings from Last 1 Encounters:  10/12/16 127 lb 6.8 oz (57.8 kg)    Ideal Body Weight:  44 kg  BMI:  Body mass index is 26.63 kg/m.  Estimated Nutritional Needs:   Kcal:  1400-1700kcal/day   Protein:  64-75g/day   Fluid:  >1.4L/day   EDUCATION NEEDS:   No education needs identified at this time  Koleen Distance, RD, LDN Pager #[phone]

## 2016-10-12 NOTE — Progress Notes (Signed)
Physical Therapy Treatment Patient Details Name: Traci Thomas MRN: [id number] DOB: [birth year] Today's Date: 10/12/2016    History of Present Illness Traci Thomas is a 66 y.o. female with a history of COPD and chronic respiratory failure on 3 L oxygen at home, diabetes mellitus, hypertension. Patient presented with acute respiratory secondary to HCAP and COPD exacerbation. [facility]    PT Comments    On arrival pt sitting EOB Indep eating lunch on 6 lts nasal at 88%.  Pt agreed to amb but declined need for walker.  Amb a very limited distance as O2 sats decreased to 74% and dyspnea 3/4.  Encouraged purse lip breathing and coughing.  Assisted back to bed and notified RN about desats. Very slow, poor recovery.  Nearly 10 min passed and O2 was still 84%.    Follow Up Recommendations  SNF;Supervision - Intermittent (back to [facility])     Equipment Recommendations       Recommendations for Other Services       Precautions / Restrictions Precautions Precautions: Fall Precaution Comments: monitor sats on O2 Restrictions Weight Bearing Restrictions: No    Mobility  Bed Mobility Overal bed mobility: Needs Assistance Bed Mobility: Sit to Supine       Sit to supine: Min guard   General bed mobility comments: assisted back to bed due to fatigue   Transfers Overall transfer level: Needs assistance Equipment used: 1 person hand held assist Transfers: Sit to/from Stand Sit to Stand: Min assist         General transfer comment: 25% VC's on safety with turns O2 tubing  Ambulation/Gait Ambulation/Gait assistance: Min assist Ambulation Distance (Feet): 12 Feet   Gait Pattern/deviations: Step-to pattern Gait velocity: decreased   General Gait Details: pt declined RW (all though it would have helped) remained on 6 lts O2 sats started at 88% and decreased to 75% with HR 112 and noted 3/4 dyspnea.     Stairs            Wheelchair Mobility     Modified Rankin (Stroke Patients Only)       Balance                                            Cognition Arousal/Alertness: Awake/alert Behavior During Therapy: WFL for tasks assessed/performed Overall Cognitive Status: Within Functional Limits for tasks assessed                                        Exercises      General Comments        Pertinent Vitals/Pain Pain Assessment: No/denies pain    Home Living                      Prior Function            PT Goals (current goals can now be found in the care plan section) Progress towards PT goals: Progressing toward goals    Frequency    Min 2X/week      PT Plan Current plan remains appropriate    Co-evaluation             End of Session Equipment Utilized During Treatment: Oxygen Activity Tolerance: Other (comment) (desat, dyspnea and  alot congestion) Patient left: in bed;with call bell/phone within reach Nurse Communication:  (low O2 sats after actvity) PT Visit Diagnosis: Unsteadiness on feet (R26.81)     Time: 2909-0301 PT Time Calculation (min) (ACUTE ONLY): 25 min  Charges:  $Gait Training: 8-22 mins $Therapeutic Activity: 8-22 mins                    G Codes:       Traci Thomas  PTA [facility]  Acute  Rehab Pager      [phone]

## 2016-10-13 ENCOUNTER — Inpatient Hospital Stay (HOSPITAL_COMMUNITY): Payer: Medicare Other

## 2016-10-13 DIAGNOSIS — I509 Heart failure, unspecified: Secondary | ICD-10-CM

## 2016-10-13 LAB — CBC WITH DIFFERENTIAL/PLATELET
BASOS PCT: 0 %
Basophils Absolute: 0 10*3/uL (ref 0.0–0.1)
EOS ABS: 0.2 10*3/uL (ref 0.0–0.7)
Eosinophils Relative: 2 %
HCT: 40.3 % (ref 36.0–46.0)
Hemoglobin: 12.8 g/dL (ref 12.0–15.0)
Lymphocytes Relative: 23 %
Lymphs Abs: 2.8 10*3/uL (ref 0.7–4.0)
MCH: 28.4 pg (ref 26.0–34.0)
MCHC: 31.8 g/dL (ref 30.0–36.0)
MCV: 89.6 fL (ref 78.0–100.0)
MONO ABS: 1.5 10*3/uL — AB (ref 0.1–1.0)
MONOS PCT: 12 %
Neutro Abs: 7.8 10*3/uL — ABNORMAL HIGH (ref 1.7–7.7)
Neutrophils Relative %: 63 %
Platelets: 404 10*3/uL — ABNORMAL HIGH (ref 150–400)
RBC: 4.5 MIL/uL (ref 3.87–5.11)
RDW: 16.3 % — AB (ref 11.5–15.5)
WBC: 12.3 10*3/uL — ABNORMAL HIGH (ref 4.0–10.5)

## 2016-10-13 LAB — BASIC METABOLIC PANEL
Anion gap: 11 (ref 5–15)
BUN: 23 mg/dL — AB (ref 6–20)
CO2: 33 mmol/L — ABNORMAL HIGH (ref 22–32)
CREATININE: 0.85 mg/dL (ref 0.44–1.00)
Calcium: 9.7 mg/dL (ref 8.9–10.3)
Chloride: 92 mmol/L — ABNORMAL LOW (ref 101–111)
Glucose, Bld: 116 mg/dL — ABNORMAL HIGH (ref 65–99)
POTASSIUM: 4.6 mmol/L (ref 3.5–5.1)
SODIUM: 136 mmol/L (ref 135–145)

## 2016-10-13 LAB — GLUCOSE, CAPILLARY
GLUCOSE-CAPILLARY: 114 mg/dL — AB (ref 65–99)
Glucose-Capillary: 351 mg/dL — ABNORMAL HIGH (ref 65–99)
Glucose-Capillary: 176 mg/dL — ABNORMAL HIGH (ref 65–99)
Glucose-Capillary: 240 mg/dL — ABNORMAL HIGH (ref 65–99)

## 2016-10-13 LAB — ECHOCARDIOGRAM COMPLETE
HEIGHTINCHES: 58 in
WEIGHTICAEL: 2028.23 [oz_av]

## 2016-10-13 MED ORDER — METHYLPREDNISOLONE SODIUM SUCC 40 MG IJ SOLR
40.0000 mg | Freq: Three times a day (TID) | INTRAMUSCULAR | Status: DC
  Administered 2016-10-13 – 2016-10-15 (×6): 40 mg via INTRAVENOUS
  Filled 2016-10-13 (×6): qty 1

## 2016-10-13 MED ORDER — IPRATROPIUM-ALBUTEROL 0.5-2.5 (3) MG/3ML IN SOLN
3.0000 mL | Freq: Four times a day (QID) | RESPIRATORY_TRACT | Status: DC
  Administered 2016-10-13 – 2016-10-17 (×16): 3 mL via RESPIRATORY_TRACT
  Filled 2016-10-13 (×16): qty 3

## 2016-10-13 MED ORDER — IPRATROPIUM-ALBUTEROL 0.5-2.5 (3) MG/3ML IN SOLN: 3.0000 mL | RESPIRATORY_TRACT | Status: DC | PRN

## 2016-10-13 NOTE — Progress Notes (Signed)
Patient not ready for discharge today, SNF updated. CSW will continue to assist with discharge, once patient is medically stable.   Kathrin Greathouse, Latanya Presser, MSW Clinical Social Worker 5E and Psychiatric Service Line [phone] 10/13/2016  3:21 PM

## 2016-10-13 NOTE — Progress Notes (Signed)
PROGRESS NOTE    Traci Thomas  FMB:[id number] DOB: 1950-07-07 DOA: 09/28/2016 PCP: [name], NP    Brief Narrative:  66 yo female with history of COPD presented with the chief complain of worsening dyspnea. Worsening symptoms for the last 5 to 7 days, associate with confusion, fatigue and cough. On the initial examination patient in respiratory distress with oxygen saturation 76%, tachycardic (139) and febrile (102.5). Significant wheezing, with poor air movement and bilateral rales, more right than left. Chest film with bilateral infiltrates. Admitted to the ICU with sepsis due to health care associated pneumonia. Placed on non invasive mechanical ventilation that tolerated well. Found with volume overload and was treated with diuresis. Persistent hypoxemia.    Assessment & Plan:   Active Problems:   COPD exacerbation (HCC)   Acute on chronic respiratory failure with hypoxia (HCC)   Acute respiratory failure (HCC)   Abdominal pain   Sepsis due to pneumonia ([place])   Lobar pneumonia ([place])   Acute metabolic encephalopathy   1. Sepsis due to health care associated pneumonia (present on admission). Completed ceftazidime for 7 days during this hospitalization. Patient with increase oxygen requirements, 8L per minute per high flow [place], follow chest film personally reviewed, noted significant hyperinflation and small right pleural effusion. Will continue supplemental 02 per [place] and chest PT.   2. Hypoxic respiratory failure. Will continue on systemic steroids and bronchodilator, no signs of pulmonary edema per today's chest film. Continue oxymetry monitoring and supplemental 02 per [place] to target 02 sat above 88%.   3 Acute on chronic systolic heart failure decompensation. Continue blood pressure control with clonidine. No signs of pulmonary edema.     4. COPD flare. Continue systemic steroids, bronchodilator therapy, advanced COPD, with poor prognosis. Chest pt and use of flutter valve.     5. T2DM. Glucose cover and monitoring with iss, capillary glucose 219, 114, 351. Tolerating po well.   6. HTN. Continue clonidine for blood pressure control, systolic 701 to 779.   7. Depression. On seroquel and venlafaxine. No significant confusion or agitation.    DVT prophylaxis: enoxaparin  Code Status: Partial (no cpr and no acls) Family Communication: No family at the bedside  Disposition Plan:   Consultants:   Pulmonary    Procedures:  Antimicrobials:    Subjective: Patient with persistent dyspnea and cough, worse on exertion. Had episodes of desaturation, required increase in oxygen flow. No nausea or vomiting, no chest pain.   Objective: Vitals:   10/12/16 2032 10/13/16 0336 10/13/16 0605 10/13/16 0755  BP: (!) 144/81  117/69   Pulse: (!) 106  95 96  Resp: (!) 22  20 18   Temp: 98.7 F (37.1 C)  98.4 F (36.9 C)   TempSrc: Oral  Oral   SpO2: (!) 88%  (!) 89% (S) (!) 85%  Weight:  57.5 kg (126 lb 12.2 oz)    Height:        Intake/Output Summary (Last 24 hours) at 10/13/16 0951 Last data filed at 10/13/16 0844  Gross per 24 hour  Intake              960 ml  Output                0 ml  Net              960 ml   Filed Weights   10/11/16 0500 10/12/16 0418 10/13/16 0336  Weight: 57.6 kg (126 lb 15.8 oz) 57.8 kg (127  lb 6.8 oz) 57.5 kg (126 lb 12.2 oz)    Examination:  General exam: deconditioned, positive dyspnea E ENT: no pallor or icterus, oral mucosa moist.  Respiratory system: decrease air movement, with positive rales, rhonchi and wheezing, scattered bilaterally.  Cardiovascular system: S1 & S2 heard, RRR. No JVD, murmurs, rubs, gallops or clicks. No pedal edema. Gastrointestinal system: Abdomen is nondistended, soft and nontender. No organomegaly or masses felt. Normal bowel sounds heard. Central nervous system: Alert and oriented. No focal neurological deficits. Extremities: Symmetric 5 x 5 power. Skin: No rashes, lesions or ulcers.      Data Reviewed: I have personally reviewed following labs and imaging studies  CBC:  Recent Labs Lab 10/13/16 0721  WBC 12.3*  NEUTROABS 7.8*  HGB 12.8  HCT 40.3  MCV 89.6  PLT 829*   Basic Metabolic Panel:  Recent Labs Lab 10/13/16 0721  NA 136  K 4.6  CL 92*  CO2 33*  GLUCOSE 116*  BUN 23*  CREATININE 0.85  CALCIUM 9.7   GFR: Estimated Creatinine Clearance: 50.1 mL/min (by C-G formula based on SCr of 0.85 mg/dL). Liver Function Tests: No results for input(s): AST, ALT, ALKPHOS, BILITOT, PROT, ALBUMIN in the last 168 hours. No results for input(s): LIPASE, AMYLASE in the last 168 hours. No results for input(s): AMMONIA in the last 168 hours. Coagulation Profile: No results for input(s): INR, PROTIME in the last 168 hours. Cardiac Enzymes: No results for input(s): CKTOTAL, CKMB, CKMBINDEX, TROPONINI in the last 168 hours. BNP (last 3 results) No results for input(s): PROBNP in the last 8760 hours. HbA1C: No results for input(s): HGBA1C in the last 72 hours. CBG:  Recent Labs Lab 10/12/16 0727 10/12/16 1205 10/12/16 1637 10/12/16 2036 10/13/16 0717  GLUCAP 114* 231* 328* 219* 114*   Lipid Profile: No results for input(s): CHOL, HDL, LDLCALC, TRIG, CHOLHDL, LDLDIRECT in the last 72 hours. Thyroid Function Tests: No results for input(s): TSH, T4TOTAL, FREET4, T3FREE, THYROIDAB in the last 72 hours. Anemia Panel: No results for input(s): VITAMINB12, FOLATE, FERRITIN, TIBC, IRON, RETICCTPCT in the last 72 hours. Sepsis Labs: No results for input(s): PROCALCITON, LATICACIDVEN in the last 168 hours.  No results found for this or any previous visit (from the past 240 hour(s)).       Radiology Studies: No results found.      Scheduled Meds: . chlorhexidine  15 mL Mouth Rinse BID  . cloNIDine  0.1 mg Oral BID  . feeding supplement (ENSURE ENLIVE)  237 mL Oral BID BM  . gabapentin  200 mg Oral Q6H  . heparin  5,000 Units Subcutaneous Q8H  .  insulin aspart  0-15 Units Subcutaneous TID WC  . insulin aspart  0-5 Units Subcutaneous QHS  . ipratropium-albuterol  3 mL Nebulization QID  . mouth rinse  15 mL Mouth Rinse q12n4p  . oxybutynin  5 mg Oral Q breakfast  . polyethylene glycol  17 g Oral BID  . predniSONE  40 mg Oral Q breakfast  . QUEtiapine  300 mg Oral QHS  . QUEtiapine  50 mg Oral Q breakfast  . theophylline  300 mg Oral BID  . venlafaxine XR  150 mg Oral Q breakfast   Continuous Infusions: . lactated ringers 10 mL/hr at 10/07/16 1836     LOS: 15 days        Trajon Rosete Gerome Apley, MD [facility] Pager [phone]  If 7PM-7AM, please contact night-coverage [email or web address] Password [facility] 10/13/2016, 9:51 AM

## 2016-10-13 NOTE — Progress Notes (Signed)
Physical Therapy Treatment Patient Details Name: Traci Thomas MRN: [id number] DOB: [birth year] Today's Date: 10/13/2016    History of Present Illness Traci Thomas is a 66 y.o. female with a history of COPD and chronic respiratory failure on 3 L oxygen at home, diabetes mellitus, hypertension. Patient presented with acute respiratory secondary to HCAP and COPD exacerbation. [facility]    PT Comments    Pt in bed on 6 lts nasal sats 88% and HR 101.  Pt very anxious today.  C/O back pain.  Assisted OOB to [facility] pt voided approx 150 and had a Mod BM despite pt c/o "I'm constipated".  Pt remained on 6 lts nasal with sats avg low 80's with activity and HR increased to 118. Marland Kitchen  Unable to attempt amb due to pt's HIGH anxiety, assisted her back to bed.  Instructed on purse lip breathing and relaxation tech sats returned to only 88%.   Pt plans to return to [facility].   Follow Up Recommendations  SNF     Equipment Recommendations  None recommended by PT    Recommendations for Other Services       Precautions / Restrictions Precautions Precautions: Fall Precaution Comments: monitor sats on O2 Restrictions Weight Bearing Restrictions: No    Mobility  Bed Mobility Overal bed mobility: Needs Assistance Bed Mobility: Supine to Sit;Sit to Supine     Supine to sit: Supervision Sit to supine: Supervision   General bed mobility comments: HOB elevated and use of rail pt able to self perform  Transfers                 General transfer comment: assisted OOB to [facility] then back to bed with 50% VC's on safety with turns and hand transition   Ambulation/Gait             General Gait Details: unable to amb this session due to HIGH anxiety and O2 sats avg 83% with transfers.     Stairs            Wheelchair Mobility    Modified Rankin (Stroke Patients Only)       Balance                                            Cognition  Arousal/Alertness: Awake/alert Behavior During Therapy: Anxious                                   General Comments: High anxiety today      Exercises      General Comments        Pertinent Vitals/Pain Pain Assessment: Faces Faces Pain Scale: Hurts little more Pain Location: back Pain Descriptors / Indicators: Aching Pain Intervention(s): Monitored during session;Repositioned    Home Living                      Prior Function            PT Goals (current goals can now be found in the care plan section) Progress towards PT goals: Progressing toward goals    Frequency    Min 2X/week      PT Plan Current plan remains appropriate    Co-evaluation  End of Session Equipment Utilized During Treatment: Oxygen Activity Tolerance: Other (comment) (anxiety)     PT Visit Diagnosis: Unsteadiness on feet (R26.81)     Time: 5027-7412 PT Time Calculation (min) (ACUTE ONLY): 26 min  Charges:  $Therapeutic Activity: 23-37 mins                    G Codes:       {Daven Pinckney  PTA [facility]  Acute  Rehab Pager      [phone]

## 2016-10-13 NOTE — Progress Notes (Signed)
Date:  October 13, 2016 Chart reviewed for concurrent status and case management needs. Will continue to follow patient progress. Increased 02 needs with exertion. Discharge Planning: following for needs Expected discharge date: [id number] Mandela Bello, BSN, [place], [place]

## 2016-10-13 NOTE — Progress Notes (Signed)
  Echocardiogram 2D Echocardiogram has been performed.  Traci Thomas 10/13/2016, 12:31 PM

## 2016-10-14 DIAGNOSIS — R0603 Acute respiratory distress: Secondary | ICD-10-CM

## 2016-10-14 LAB — GLUCOSE, CAPILLARY
GLUCOSE-CAPILLARY: 257 mg/dL — AB (ref 65–99)
GLUCOSE-CAPILLARY: 170 mg/dL — AB (ref 65–99)
GLUCOSE-CAPILLARY: 269 mg/dL — AB (ref 65–99)
Glucose-Capillary: 110 mg/dL — ABNORMAL HIGH (ref 65–99)

## 2016-10-14 NOTE — Care Management Important Message (Signed)
Important Message  Patient Details  Name: Traci Thomas MRN: [id number] Date of Birth: 1950-08-18   Medicare Important Message Given:  Yes    Kerin Salen 10/14/2016, 10:12 AMImportant Message  Patient Details  Name: Traci Thomas MRN: [id number] Date of Birth: 06-17-1951   Medicare Important Message Given:  Yes    Kerin Salen 10/14/2016, 10:11 AM

## 2016-10-14 NOTE — Progress Notes (Signed)
Inpatient Diabetes Program Recommendations  AACE/ADA: New Consensus Statement on Inpatient Glycemic Control (2015)  Target Ranges:  Prepandial:   less than 140 mg/dL      Peak postprandial:   less than 180 mg/dL (1-2 hours)      Critically ill patients:  140 - 180 mg/dL   Lab Results  Component Value Date   GLUCAP 257 (H) 10/14/2016   HGBA1C 7.2 (H) 10/05/2016    Review of Glycemic Control  Post-prandial blood sugars elevated. Need meal coverage insulin.  Inpatient Diabetes Program Recommendations:    Add Novolog 3 units tidwc for meal coverage insulin.  Will continue to follow.  Thank you. Lorenda Peck, RD, LDN, CDE Inpatient Diabetes Coordinator [phone]

## 2016-10-14 NOTE — Progress Notes (Signed)
PROGRESS NOTE    Traci Thomas  [id number]:[id number] DOB: 1950-11-26 DOA: 09/28/2016 PCP: [name], NP    Brief Narrative:  66 yo female with history of COPD presented with the chief complain of worsening dyspnea. Worsening symptoms for the last 5 to 7 days, associate with confusion, fatigue and cough. On the initial examination patient in respiratory distress with oxygen saturation 76%, tachycardic (139) and febrile (102.5). Significant wheezing, with poor air movement and bilateral rales, more right than left. Chest film with bilateral infiltrates. Admitted to the ICU with sepsis due to health care associated pneumonia. Placed on non invasive mechanical ventilation that tolerated well. Found with volume overload and was treated with diuresis. Persistent hypoxemia on high flow nasal cannula. Symptoms slowly improving with chest pt and aggressive bronchodilator therapy.    Assessment & Plan:   Active Problems:   COPD exacerbation (HCC)   Acute on chronic respiratory failure with hypoxia (HCC)   Acute respiratory failure (HCC)   Abdominal pain   Sepsis due to pneumonia ([place])   Lobar pneumonia ([place])   Acute metabolic encephalopathy  1. Sepsis due to health care associated pneumonia (present on admission). Completed ceftazidime for 7 days during this hospitalization.  2. Hypoxic respiratory failure.  Continue to taper supplemental 02 per high flow nasal cannula, today down to 7L per minute. Patient responding to aggressive chest pt, bronchodilator therapy, flutter valve. Out of bed as tolerated and physical therapy evaluation. Systemic steroids with methylprednisolone at 40 mg tid.   3 Acute on chronic systolic heart failure decompensation. Blood pressure control with clonidine. Systolic blood pressure 300 to 150. Holding on diuresis for now.  4. COPD flare. Systemic steroids, bronchodilator therapy, chest pt and use of flutter valve.  Symptoms with mild improvement.   5. T2DM. Continue  glucose cover and monitoring with iss, capillary glucose  176, 240, 110, 257. Tolerating po well.   6. HTN. Continue clonidine for blood pressure control, systolic 923 to 300.   7. Depression. Continue  seroquel and venlafaxine. No confusion or agitation. As needed alprazolam.    DVT prophylaxis:enoxaparin  Code Status:Partial (no cpr and no acls) Family Communication:No family at the bedside  Disposition Plan:  Consultants:  Pulmonary    Procedures:  Antimicrobials:    Subjective: Patient with mild improvement in the intensity of dyspnea, still quite symptomatic and using high flow nasal cannula, symptoms seem to be worse with anxiety, improved with benzodiazepines.   Objective: Vitals:   10/13/16 2042 10/13/16 2055 10/14/16 0445 10/14/16 0937  BP: (!) 145/95  135/76   Pulse: 100  (!) 106   Resp: (!) 22  20   Temp: 99 F (37.2 C)  98.4 F (36.9 C)   TempSrc: Oral  Oral   SpO2: 97% 90% 94% 93%  Weight:   57.7 kg (127 lb 3.3 oz)   Height:        Intake/Output Summary (Last 24 hours) at 10/14/16 1156 Last data filed at 10/14/16 0852  Gross per 24 hour  Intake             1080 ml  Output                0 ml  Net             1080 ml   Filed Weights   10/12/16 0418 10/13/16 0336 10/14/16 0445  Weight: 57.8 kg (127 lb 6.8 oz) 57.5 kg (126 lb 12.2 oz) 57.7 kg (127 lb 3.3 oz)  Examination:  General exam: deconditioned, not in pain E ENT. Mild pallor, oral mucosa moist. No icterus.   Respiratory system: decrease air movement, with scattered rales no rhonchi or wheezing. No accessory muscle use, Cardiovascular system: S1 & S2 heard, RRR. No JVD, murmurs, rubs, gallops or clicks. No pedal edema. Gastrointestinal system: Abdomen is nondistended, soft and nontender. No organomegaly or masses felt. Normal bowel sounds heard. Central nervous system: Alert and oriented. No focal neurological deficits. Extremities: Symmetric 5 x 5 power. Skin: No rashes,  lesions or ulcers     Data Reviewed: I have personally reviewed following labs and imaging studies  CBC:  Recent Labs Lab 10/13/16 0721  WBC 12.3*  NEUTROABS 7.8*  HGB 12.8  HCT 40.3  MCV 89.6  PLT 578*   Basic Metabolic Panel:  Recent Labs Lab 10/13/16 0721  NA 136  K 4.6  CL 92*  CO2 33*  GLUCOSE 116*  BUN 23*  CREATININE 0.85  CALCIUM 9.7   GFR: Estimated Creatinine Clearance: 50.2 mL/min (by C-G formula based on SCr of 0.85 mg/dL). Liver Function Tests: No results for input(s): AST, ALT, ALKPHOS, BILITOT, PROT, ALBUMIN in the last 168 hours. No results for input(s): LIPASE, AMYLASE in the last 168 hours. No results for input(s): AMMONIA in the last 168 hours. Coagulation Profile: No results for input(s): INR, PROTIME in the last 168 hours. Cardiac Enzymes: No results for input(s): CKTOTAL, CKMB, CKMBINDEX, TROPONINI in the last 168 hours. BNP (last 3 results) No results for input(s): PROBNP in the last 8760 hours. HbA1C: No results for input(s): HGBA1C in the last 72 hours. CBG:  Recent Labs Lab 10/13/16 0717 10/13/16 1113 10/13/16 1633 10/13/16 2045 10/14/16 0727  GLUCAP 114* 351* 176* 240* 110*   Lipid Profile: No results for input(s): CHOL, HDL, LDLCALC, TRIG, CHOLHDL, LDLDIRECT in the last 72 hours. Thyroid Function Tests: No results for input(s): TSH, T4TOTAL, FREET4, T3FREE, THYROIDAB in the last 72 hours. Anemia Panel: No results for input(s): VITAMINB12, FOLATE, FERRITIN, TIBC, IRON, RETICCTPCT in the last 72 hours. Sepsis Labs: No results for input(s): PROCALCITON, LATICACIDVEN in the last 168 hours.  No results found for this or any previous visit (from the past 240 hour(s)).       Radiology Studies: Dg Chest 2 View  Result Date: 10/13/2016 CLINICAL DATA:  Shortness of breath and weakness today. EXAM: CHEST  2 VIEW COMPARISON:  10/08/2016 FINDINGS: There is hyperinflation of the lungs compatible with COPD. Small right pleural  effusion. Linear atelectasis in the lung bases. Heart is normal size. No acute bony abnormality. IMPRESSION: COPD.  Small right effusion and bibasilar atelectasis. Electronically Signed   By: Rolm Baptise M.D.   On: 10/13/2016 10:49        Scheduled Meds: . chlorhexidine  15 mL Mouth Rinse BID  . cloNIDine  0.1 mg Oral BID  . feeding supplement (ENSURE ENLIVE)  237 mL Oral BID BM  . gabapentin  200 mg Oral Q6H  . heparin  5,000 Units Subcutaneous Q8H  . insulin aspart  0-15 Units Subcutaneous TID WC  . insulin aspart  0-5 Units Subcutaneous QHS  . ipratropium-albuterol  3 mL Nebulization Q6H  . mouth rinse  15 mL Mouth Rinse q12n4p  . methylPREDNISolone (SOLU-MEDROL) injection  40 mg Intravenous Q8H  . oxybutynin  5 mg Oral Q breakfast  . polyethylene glycol  17 g Oral BID  . QUEtiapine  300 mg Oral QHS  . QUEtiapine  50 mg Oral Q breakfast  .  theophylline  300 mg Oral BID  . venlafaxine XR  150 mg Oral Q breakfast   Continuous Infusions: . lactated ringers 10 mL/hr at 10/07/16 1836     LOS: 16 days     Mauricio Gerome Apley, MD [facility] Pager [phone]  If 7PM-7AM, please contact night-coverage [email or web address] Password [facility] 10/14/2016, 11:56 AM

## 2016-10-15 LAB — GLUCOSE, CAPILLARY
GLUCOSE-CAPILLARY: 245 mg/dL — AB (ref 65–99)
GLUCOSE-CAPILLARY: 109 mg/dL — AB (ref 65–99)
GLUCOSE-CAPILLARY: 213 mg/dL — AB (ref 65–99)
Glucose-Capillary: 384 mg/dL — ABNORMAL HIGH (ref 65–99)
Glucose-Capillary: 66 mg/dL (ref 65–99)

## 2016-10-15 MED ORDER — METHYLPREDNISOLONE SODIUM SUCC 40 MG IJ SOLR
40.0000 mg | Freq: Two times a day (BID) | INTRAMUSCULAR | Status: DC
  Administered 2016-10-15 – 2016-10-17 (×4): 40 mg via INTRAVENOUS
  Filled 2016-10-15 (×4): qty 1

## 2016-10-15 MED ORDER — INSULIN ASPART 100 UNIT/ML ~~LOC~~ SOLN
3.0000 [IU] | Freq: Three times a day (TID) | SUBCUTANEOUS | Status: DC
  Administered 2016-10-15 – 2016-10-16 (×2): 3 [IU] via SUBCUTANEOUS

## 2016-10-15 MED ORDER — POLYETHYLENE GLYCOL 3350 17 G PO PACK
17.0000 g | PACK | Freq: Every day | ORAL | Status: DC
  Administered 2016-10-17: 17 g via ORAL
  Filled 2016-10-15 (×2): qty 1

## 2016-10-15 MED ORDER — INSULIN DETEMIR 100 UNIT/ML ~~LOC~~ SOLN
10.0000 [IU] | Freq: Every day | SUBCUTANEOUS | Status: DC
  Administered 2016-10-15: 10 [IU] via SUBCUTANEOUS
  Filled 2016-10-15 (×2): qty 0.1

## 2016-10-15 NOTE — Progress Notes (Signed)
PROGRESS NOTE    Traci Thomas  [id number]:[id number] DOB: 07/05/1950 DOA: 09/28/2016 PCP: [name], NP    Brief Narrative:  66 yo female with history of COPD presented with the chief complain of worsening dyspnea. Worsening symptoms for the last 5 to 7 days, associate with confusion, fatigue and cough. On the initial examination patient in respiratory distress with oxygen saturation 76%, tachycardic (139) and febrile (102.5). Significant wheezing, with poor air movement and bilateral rales, more right than left. Chest film with bilateral infiltrates. Admitted to the ICU with sepsis due to health care associated pneumonia. Placed on non invasive mechanical ventilation that tolerated well. Found with volume overload and was treated with diuresis. Persistent hypoxemia on high flow nasal cannula. Symptoms slowly improving with chest pt and aggressive bronchodilator therapy. 10/15/16 down to 5 lpm flow nasal cannula.    Assessment & Plan:   Active Problems:   COPD exacerbation (HCC)   Acute on chronic respiratory failure with hypoxia (HCC)   Acute respiratory failure (HCC)   Abdominal pain   Sepsis due to pneumonia ([place])   Lobar pneumonia ([place])   Acute metabolic encephalopathy   1. Sepsis due to health care associated pneumonia (present on admission). Treated with ceftazidime for 7 days during this hospitalization.  2. Hypoxic respiratory failure.  Succesfull tapering of supplemental 02 today down to 5L per minute regular nasal cannula. Patient responding well to aggressive chest pt, bronchodilator therapy, flutter valve. Early mobilization. Systemic steroids with methylprednisolone at 40 mg will be reduced to bid.   3 Acute on chronic systolic heart failure decompensation. Systolic blood pressure 469 to 150. Holding on diuresis for now, patient euvolemic. Not on antihypertensive agents at home, will dc clonidine for now.   4. COPD flare. Continue to taper systemic steroids, continue  bronchodilator therapy, chest pt and use of flutter valve.    5. T2DM. Continue glucose cover and monitoring with iss, capillary glucose  269, 245, 384 Tolerating po well. Will add basal regimen of insulin with levimir 10 units. Pre-meal aspart 3 units.  6. HTN. Continue  blood pressure monitoring, systolic 629 to 528. Patient not on antihypertensive medications at home.   7. Depression. On seroquel and venlafaxine. No confusion or agitation. As needed alprazolam, with good toleration.   8. Chronic pain syndrome. Chronic back pain, will continue pain control with as needed fentanyl, last dose 04/26. Continue gabapentin and hydrocodone.   DVT prophylaxis:enoxaparin  Code Status:Partial (no cpr and no acls) Family Communication:No family at the bedside  Disposition Plan:  Consultants:  Pulmonary    Procedures:  Antimicrobials:    Subjective: Patient feeling better, dyspnea slowly improving, no nausea or vomiting. Positive back pain, worse with movement. Positive anxiety.   Objective: Vitals:   10/14/16 2108 10/15/16 0146 10/15/16 0347 10/15/16 0914  BP: (!) 163/72  117/74   Pulse: (!) 106  (!) 106   Resp: 19  19   Temp: 98.8 F (37.1 C)  98.1 F (36.7 C)   TempSrc: Oral  Oral   SpO2: 95% 94% 92% 91%  Weight:      Height:        Intake/Output Summary (Last 24 hours) at 10/15/16 0917 Last data filed at 10/15/16 0800  Gross per 24 hour  Intake              480 ml  Output                0 ml  Net  480 ml   Filed Weights   10/12/16 0418 10/13/16 0336 10/14/16 0445  Weight: 57.8 kg (127 lb 6.8 oz) 57.5 kg (126 lb 12.2 oz) 57.7 kg (127 lb 3.3 oz)    Examination:   General exam: deconditioned E ENT: mild pallor, oral mucosa moist.  Respiratory system: decreased air movement with scattered rhonchi and rales, no significant wheezing.  Cardiovascular system: S1 & S2 heard, RRR. No JVD, murmurs, rubs, gallops or clicks. No pedal  edema. Gastrointestinal system: Abdomen is nondistended, soft and nontender. No organomegaly or masses felt. Normal bowel sounds heard. Central nervous system: Alert and oriented. No focal neurological deficits. Extremities: Symmetric 5 x 5 power. Skin: No rashes, lesions or ulcers     Data Reviewed: I have personally reviewed following labs and imaging studies  CBC:  Recent Labs Lab 10/13/16 0721  WBC 12.3*  NEUTROABS 7.8*  HGB 12.8  HCT 40.3  MCV 89.6  PLT 347*   Basic Metabolic Panel:  Recent Labs Lab 10/13/16 0721  NA 136  K 4.6  CL 92*  CO2 33*  GLUCOSE 116*  BUN 23*  CREATININE 0.85  CALCIUM 9.7   GFR: Estimated Creatinine Clearance: 50.2 mL/min (by C-G formula based on SCr of 0.85 mg/dL). Liver Function Tests: No results for input(s): AST, ALT, ALKPHOS, BILITOT, PROT, ALBUMIN in the last 168 hours. No results for input(s): LIPASE, AMYLASE in the last 168 hours. No results for input(s): AMMONIA in the last 168 hours. Coagulation Profile: No results for input(s): INR, PROTIME in the last 168 hours. Cardiac Enzymes: No results for input(s): CKTOTAL, CKMB, CKMBINDEX, TROPONINI in the last 168 hours. BNP (last 3 results) No results for input(s): PROBNP in the last 8760 hours. HbA1C: No results for input(s): HGBA1C in the last 72 hours. CBG:  Recent Labs Lab 10/14/16 0727 10/14/16 1155 10/14/16 1643 10/14/16 2107 10/15/16 0730  GLUCAP 110* 257* 170* 269* 245*   Lipid Profile: No results for input(s): CHOL, HDL, LDLCALC, TRIG, CHOLHDL, LDLDIRECT in the last 72 hours. Thyroid Function Tests: No results for input(s): TSH, T4TOTAL, FREET4, T3FREE, THYROIDAB in the last 72 hours. Anemia Panel: No results for input(s): VITAMINB12, FOLATE, FERRITIN, TIBC, IRON, RETICCTPCT in the last 72 hours. Sepsis Labs: No results for input(s): PROCALCITON, LATICACIDVEN in the last 168 hours.  No results found for this or any previous visit (from the past 240  hour(s)).       Radiology Studies: Dg Chest 2 View  Result Date: 10/13/2016 CLINICAL DATA:  Shortness of breath and weakness today. EXAM: CHEST  2 VIEW COMPARISON:  10/08/2016 FINDINGS: There is hyperinflation of the lungs compatible with COPD. Small right pleural effusion. Linear atelectasis in the lung bases. Heart is normal size. No acute bony abnormality. IMPRESSION: COPD.  Small right effusion and bibasilar atelectasis. Electronically Signed   By: Rolm Baptise M.D.   On: 10/13/2016 10:49        Scheduled Meds: . chlorhexidine  15 mL Mouth Rinse BID  . cloNIDine  0.1 mg Oral BID  . feeding supplement (ENSURE ENLIVE)  237 mL Oral BID BM  . gabapentin  200 mg Oral Q6H  . heparin  5,000 Units Subcutaneous Q8H  . insulin aspart  0-15 Units Subcutaneous TID WC  . insulin aspart  0-5 Units Subcutaneous QHS  . ipratropium-albuterol  3 mL Nebulization Q6H  . mouth rinse  15 mL Mouth Rinse q12n4p  . methylPREDNISolone (SOLU-MEDROL) injection  40 mg Intravenous Q8H  . oxybutynin  5  mg Oral Q breakfast  . polyethylene glycol  17 g Oral BID  . QUEtiapine  300 mg Oral QHS  . QUEtiapine  50 mg Oral Q breakfast  . theophylline  300 mg Oral BID  . venlafaxine XR  150 mg Oral Q breakfast   Continuous Infusions: . lactated ringers 10 mL/hr at 10/07/16 1836     LOS: 17 days     Sada Mazzoni Gerome Apley, MD [facility] Pager [phone]  If 7PM-7AM, please contact night-coverage [email or web address] Password [facility] 10/15/2016, 9:17 AM

## 2016-10-15 NOTE — Clinical Social Work Note (Signed)
Per MD- plan d/c tomorrow to [place] notified [facility]- admissions. Bed will be available. Report left for Sunday SW coverage. Notified patient and her son [name]. Lorie Phenix. Pauline Good, [place]  (weekend coverage)

## 2016-10-15 NOTE — Progress Notes (Signed)
Hypoglycemic Event  CBG: 66  Treatment: 15 GM carbohydrate snack  Symptoms: Nervous/irritable  Follow-up CBG: Time:1801 CBG Result:109  Possible Reasons for Event: Medication regimen: Insulin was ajusted by MD  Comments/MD notified: Dr. Cathlean Sauer paged about hypoglycemic event.    Traci Thomas

## 2016-10-16 LAB — GLUCOSE, CAPILLARY
GLUCOSE-CAPILLARY: 164 mg/dL — AB (ref 65–99)
GLUCOSE-CAPILLARY: 388 mg/dL — AB (ref 65–99)
Glucose-Capillary: 176 mg/dL — ABNORMAL HIGH (ref 65–99)
Glucose-Capillary: 403 mg/dL — ABNORMAL HIGH (ref 65–99)

## 2016-10-16 MED ORDER — INSULIN ASPART 100 UNIT/ML ~~LOC~~ SOLN
10.0000 [IU] | Freq: Once | SUBCUTANEOUS | Status: AC
  Administered 2016-10-16: 10 [IU] via SUBCUTANEOUS

## 2016-10-16 MED ORDER — INSULIN DETEMIR 100 UNIT/ML ~~LOC~~ SOLN
5.0000 [IU] | Freq: Every day | SUBCUTANEOUS | Status: DC
  Administered 2016-10-16 – 2016-10-17 (×2): 5 [IU] via SUBCUTANEOUS
  Filled 2016-10-16 (×2): qty 0.05

## 2016-10-16 NOTE — Progress Notes (Signed)
PROGRESS NOTE    Traci Thomas  RXV:[id number] DOB: Jul 12, 1950 DOA: 09/28/2016 PCP: [name], NP    Brief Narrative:  66 yo female with history of COPD presented with the chief complain of worsening dyspnea. Worsening symptoms for the last 5 to 7 days, associate with confusion, fatigue and cough. On the initial examination patient in respiratory distress with oxygen saturation 76%, tachycardic (139) and febrile (102.5). Significant wheezing, with poor air movement and bilateral rales, more right than left. Chest film with bilateral infiltrates. Admitted to the ICU with sepsis due to health care associated pneumonia. Placed on non invasive mechanical ventilation that tolerated well. Found with volume overload and was treated with diuresis. Persistent hypoxemia on high flow nasal cannula. Symptoms slowly improving with chest pt and aggressive bronchodilator therapy. 10/15/16 down to 5 lpm flow nasal cannula. Plan to discharge in am if decreases supplemental 02 requirements to less or equal to 5 LPM.    Assessment & Plan:   Active Problems:   COPD exacerbation (HCC)   Acute on chronic respiratory failure with hypoxia (HCC)   Acute respiratory failure (HCC)   Abdominal pain   Sepsis due to pneumonia ([place])   Lobar pneumonia ([place])   Acute metabolic encephalopathy   1. Sepsis due to health care associated pneumonia (present on admission). Completed therapy with ceftazidime for 7 days during this hospitalization.  2. Hypoxic respiratory failure. This am with increased supplemental 02 up to 6 L per minute regular nasal cannula. Responding well to aggressive chest pt, bronchodilator therapy, flutter valve and early mobilization. On systemic steroids, methylprednisolone at 40 mg IV bid. At home patient on 3 LPM of supplemental 02 per [place], will continue to reduce supplemental 02 as tolerated, keep 02 saturation above 88%. Suspect a very poor lung functio at baseline.    3 Acute on chronic  systolic heart failure decompensation. Systolic blood pressure 509 to 150. [place] to hold diuresis, patient euvolemic. Patient off clonidine.   4. COPD flare.  Systemic steroids,  bronchodilator therapy, chest pt and use of flutter valve. And supplemental 02 per [place]. Plan to discharge to snf to continue pulmonary rehabilitation.   5. T2DM. Episode of hypoglycemia, down to 66, will decrease dose of levimir down to 5 units, continue glucose cover and monitoring with iss, capillary glucose 213=164-388. Tolerating po well.   6. HTN. Clonidine discontinued with good toleration.   7. Depression. Patient onseroquel,  Venlafaxine and as needed alprazolam. No noted confusion or agitation.   8. Chronic pain syndrome. Chronic back pain, pain controlled, will hold on fentanyl, continue gabapentin and hydrocodone. Out of bed as tolerated.   DVT prophylaxis:enoxaparin  Code Status:Partial (no cpr and no acls) Family Communication:No family at the bedside  Disposition Plan:  Consultants:  Pulmonary    Procedures:  Antimicrobials:    Subjective: Patient had a better night of sleep, dyspnea and congestion improved after chest pt. No nausea or vomiting, tolerating po well, positive weakness.   Objective: Vitals:   10/16/16 0500 10/16/16 0514 10/16/16 0847 10/16/16 1001  BP:  139/65    Pulse:  (!) 108    Resp:      Temp:  98.9 F (37.2 C)    TempSrc:  Oral    SpO2:  99% 92% 98%  Weight: 56.4 kg (124 lb 5.4 oz)     Height:        Intake/Output Summary (Last 24 hours) at 10/16/16 1003 Last data filed at 10/16/16 0411  Gross per  24 hour  Intake              480 ml  Output              850 ml  Net             -370 ml   Filed Weights   10/13/16 0336 10/14/16 0445 10/16/16 0500  Weight: 57.5 kg (126 lb 12.2 oz) 57.7 kg (127 lb 3.3 oz) 56.4 kg (124 lb 5.4 oz)    Examination:  General exam: deconditioned E ENT: mild pallor, no icterus, oral mucosa moist. Respiratory  system: diffuse bilateral scattered rales and rhonchi, no significant wheezing. Positive dry cough.  Cardiovascular system: S1 & S2 heard, RRR. No JVD, murmurs, rubs, gallops or clicks. No pedal edema. Gastrointestinal system: Abdomen is nondistended, soft and nontender. No organomegaly or masses felt. Normal bowel sounds heard. Central nervous system: Alert and oriented. No focal neurological deficits. Extremities: Symmetric 5 x 5 power. Skin: No rashes, lesions or ulcers    Data Reviewed: I have personally reviewed following labs and imaging studies  CBC:  Recent Labs Lab 10/13/16 0721  WBC 12.3*  NEUTROABS 7.8*  HGB 12.8  HCT 40.3  MCV 89.6  PLT 469*   Basic Metabolic Panel:  Recent Labs Lab 10/13/16 0721  NA 136  K 4.6  CL 92*  CO2 33*  GLUCOSE 116*  BUN 23*  CREATININE 0.85  CALCIUM 9.7   GFR: Estimated Creatinine Clearance: 49.7 mL/min (by C-G formula based on SCr of 0.85 mg/dL). Liver Function Tests: No results for input(s): AST, ALT, ALKPHOS, BILITOT, PROT, ALBUMIN in the last 168 hours. No results for input(s): LIPASE, AMYLASE in the last 168 hours. No results for input(s): AMMONIA in the last 168 hours. Coagulation Profile: No results for input(s): INR, PROTIME in the last 168 hours. Cardiac Enzymes: No results for input(s): CKTOTAL, CKMB, CKMBINDEX, TROPONINI in the last 168 hours. BNP (last 3 results) No results for input(s): PROBNP in the last 8760 hours. HbA1C: No results for input(s): HGBA1C in the last 72 hours. CBG:  Recent Labs Lab 10/15/16 1202 10/15/16 1714 10/15/16 1801 10/15/16 2136 10/16/16 0730  GLUCAP 384* 66 109* 213* 164*   Lipid Profile: No results for input(s): CHOL, HDL, LDLCALC, TRIG, CHOLHDL, LDLDIRECT in the last 72 hours. Thyroid Function Tests: No results for input(s): TSH, T4TOTAL, FREET4, T3FREE, THYROIDAB in the last 72 hours. Anemia Panel: No results for input(s): VITAMINB12, FOLATE, FERRITIN, TIBC, IRON,  RETICCTPCT in the last 72 hours. Sepsis Labs: No results for input(s): PROCALCITON, LATICACIDVEN in the last 168 hours.  No results found for this or any previous visit (from the past 240 hour(s)).       Radiology Studies: No results found.      Scheduled Meds: . chlorhexidine  15 mL Mouth Rinse BID  . feeding supplement (ENSURE ENLIVE)  237 mL Oral BID BM  . gabapentin  200 mg Oral Q6H  . heparin  5,000 Units Subcutaneous Q8H  . insulin aspart  0-15 Units Subcutaneous TID WC  . insulin aspart  0-5 Units Subcutaneous QHS  . insulin detemir  5 Units Subcutaneous Daily  . ipratropium-albuterol  3 mL Nebulization Q6H  . mouth rinse  15 mL Mouth Rinse q12n4p  . methylPREDNISolone (SOLU-MEDROL) injection  40 mg Intravenous Q12H  . oxybutynin  5 mg Oral Q breakfast  . polyethylene glycol  17 g Oral Daily  . QUEtiapine  300 mg Oral QHS  . QUEtiapine  50 mg Oral Q breakfast  . theophylline  300 mg Oral BID  . venlafaxine XR  150 mg Oral Q breakfast   Continuous Infusions: . lactated ringers 10 mL/hr at 10/07/16 1836     LOS: 18 days       Jazmynn Pho Gerome Apley, MD [facility] Pager [phone]  If 7PM-7AM, please contact night-coverage [email or web address] Password [email or web address] 10/16/2016, 10:03 AM

## 2016-10-16 NOTE — Progress Notes (Signed)
Page: [phone] [place] CBG is 403 order to call if it's over 400. Thank you, Danae Chen

## 2016-10-17 LAB — GLUCOSE, CAPILLARY
GLUCOSE-CAPILLARY: 353 mg/dL — AB (ref 65–99)
Glucose-Capillary: 177 mg/dL — ABNORMAL HIGH (ref 65–99)

## 2016-10-17 MED ORDER — ALBUTEROL SULFATE (2.5 MG/3ML) 0.083% IN NEBU: 2.5000 mg | INHALATION_SOLUTION | RESPIRATORY_TRACT | 12 refills | Status: AC | PRN

## 2016-10-17 MED ORDER — HYDROCODONE-ACETAMINOPHEN 5-325 MG PO TABS: 1.0000 | ORAL_TABLET | Freq: Four times a day (QID) | ORAL | 0 refills | Status: DC | PRN

## 2016-10-17 MED ORDER — PREDNISONE 20 MG PO TABS: 40.0000 mg | ORAL_TABLET | Freq: Every day | ORAL | Status: DC

## 2016-10-17 MED ORDER — ALPRAZOLAM 0.5 MG PO TABS: 0.5000 mg | ORAL_TABLET | Freq: Three times a day (TID) | ORAL | 0 refills | Status: DC | PRN

## 2016-10-17 MED ORDER — INSULIN ASPART 100 UNIT/ML ~~LOC~~ SOLN: 0.0000 [IU] | Freq: Three times a day (TID) | SUBCUTANEOUS | 11 refills | Status: DC

## 2016-10-17 MED ORDER — ENSURE ENLIVE PO LIQD: 237.0000 mL | Freq: Two times a day (BID) | ORAL | 12 refills | Status: DC

## 2016-10-17 MED ORDER — PREDNISONE 20 MG PO TABS: 40.0000 mg | ORAL_TABLET | Freq: Every day | ORAL | 0 refills | Status: DC

## 2016-10-17 NOTE — Progress Notes (Signed)
Date: October 17, 2016 Discharge orders review for case management needs.  None found Velva Harman, BSN, [place], [place]: [phone]

## 2016-10-17 NOTE — Progress Notes (Signed)
Inpatient Diabetes Program Recommendations  AACE/ADA: New Consensus Statement on Inpatient Glycemic Control (2015)  Target Ranges:  Prepandial:   less than 140 mg/dL      Peak postprandial:   less than 180 mg/dL (1-2 hours)      Critically ill patients:  140 - 180 mg/dL   Results for JACKYE, DEVER (MRN [id number]) as of 10/17/2016 09:43  Ref. Range 10/16/2016 07:30 10/16/2016 12:17 10/16/2016 17:13 10/16/2016 20:21 10/17/2016 07:31  Glucose-Capillary Latest Ref Range: 65 - 99 mg/dL 164 (H) 388 (H) 176 (H) 403 (H) 177 (H)    Review of Glycemic Control  Diabetes history: DM 2 Current orders for Inpatient glycemic control: Levemir 5 units Daily, Novolog Moderate scale tid + Novolog HS scale  Inpatient Diabetes Program Recommendations:    Glucose elevated significantly after meals with lower insulin doses, but trends downward with higher doses of insulin.  Need meal coverage insulin.    Consider adding Novolog 3 units tidwc for meal coverage insulin.  Thanks,  Tama Headings RN, MSN, [facility] Inpatient Diabetes Coordinator Team Pager [phone] (8a-5p)

## 2016-10-17 NOTE — Progress Notes (Signed)
Date:  October 17, 2016 Chart reviewed for concurrent status and case management needs. Will continue to follow patient progress./ Resp. Status, tachypenic, wheezing and rhonci, iv continuous Albuterol infusion. Discharge Planning: following for needs Expected discharge date: [id number] Velva Harman, BSN, [place], [place]

## 2016-10-17 NOTE — Progress Notes (Signed)
Nutrition Follow Up Note   DOCUMENTATION CODES:   Not applicable  INTERVENTION:   Ensure Enlive po BID, each supplement provides 350 kcal and 20 grams of protein  NUTRITION DIAGNOSIS:   Increased nutrient needs related to catabolic illness as evidenced by increased estimated needs from protein.  GOAL:   Patient will meet greater than or equal to 90% of their needs  MONITOR:   PO intake, Supplement acceptance, Labs, Weight trends  ASSESSMENT:   66 y.o. female with a history of COPD and chronic respiratory failure on 3 L oxygen at home, diabetes mellitus, hypertension. Patient presented with acute respiratory secondary to HCAP and COPD exacerbation   Pt continues to do well. Eating 100% meals and drinking supplements. Pt with weight loss since admit secondary to volume overload and diuresis. Pt with intermittent hypoglycemia and hyperglycemia. Plan for pt to discharge to SNF.   Medications reviewed and include: heparin, insulin, solu-medrol, miralax, hydrocodone   Labs reviewed: no labs since 4/26  Diet Order:  Diet Carb Modified Fluid consistency: Thin; Room service appropriate? Yes  Skin:  Reviewed, no issues  Last BM:  4/23  Height:   Ht Readings from Last 1 Encounters:  09/28/16 4\' 10"  (1.473 m)    Weight:   Wt Readings from Last 1 Encounters:  10/17/16 125 lb 14.1 oz (57.1 kg)    Ideal Body Weight:  44 kg  BMI:  Body mass index is 26.31 kg/m.  Estimated Nutritional Needs:   Kcal:  1400-1700kcal/day   Protein:  64-75g/day   Fluid:  >1.4L/day   EDUCATION NEEDS:   No education needs identified at this time  Koleen Distance, RD, LDN Pager #[phone]

## 2016-10-17 NOTE — Discharge Summary (Signed)
Physician Discharge Summary  Traci Thomas:[id number] DOB: September 13, 1950 DOA: 09/28/2016  PCP: [name], NP  Admit date: 09/28/2016 Discharge date: 10/17/2016  Admitted From: SNF   Disposition:  SNF  Recommendations for Outpatient Follow-up:  1. Follow up with PCP in 1-week 2. Slow prednisone taper over the next 6 days. 3. Continue pulmonary rehabilitation  Home Health: NA Equipment/Devices: Home 02  Discharge Condition: Stable  CODE STATUS: Partial (only mechanical ventilation)  Diet recommendation: Regular  Brief/Interim Summary: 66 yo female with history of COPD presented with the chief complain of worsening dyspnea. Worsening symptoms for the last 5 to 7 days, associate with confusion, fatigue and cough. On the initial examination patient in respiratory distress with oxygen saturation 76%, tachycardic (139) and febrile (102.5). Significant wheezing, with poor air movement and bilateral rales, more right than left. Chest film with bilateral infiltrates at bases, positive signs of hyperinflation. ABG pH 7.239, PCO2 63.5, PO2 91.8, oxygen saturation 98.6, 50%, BiPAP. Sodium 140, potassium 4.4, chloride 11, bicarbonate 29, glucose 231, BUN 23, creatinine 0.90, AST 16, ALT 9, white count 16.1, hemoglobin 11.6, hematocrit 37.5, platelets 278, urinalysis with 6-30 white cells, too numerous to count RBCs, negative proteins, glucose 150. EKG was sinus tachycardia.  The patient was admitted to intensive care unit with the working diagnosis of acute on chronic hypoxic-hypercapnic respiratory failure due to healthcare associated pneumonia.   1. Acute on chronic hypoxic/hypercapnic respiratory failure due to health care associated pneumonia. (Present on admission). Patient was placed on non invasive mechanical ventilation that he tolerated well. She was placed ceftazidime intravenously for 7 days with good response. She was successfully liberated from noninvasive mechanical ventilation to high  flow nasal cannula. Aggressive bronchodilator therapy and chest physical therapy, patient had copious secretions. By the time of discharge she was on 4 L of supplemental oxygen per regular nasal cannula with an oxygen saturation 96%.   2. Acute on chronic diastolic heart failure. Patient received IV furosemide with good response. Echocardiography with preserved left ventricle systolic function at fraction 50-55%. Continue blood pressure control.  3. COPD exacerbation. Patient was placed on aggressive bronchodilator therapy, systemic steroids, submental oxygen and oximetry monitoring. Her oxygen requirements have been decreasing, currently on 4 L per nasal cannula with oxygen saturation above 92%.  4. Type 2 diabetes mellitus. Patient developed hyperglycemia, likely related to systemic steroids. Patient was placed on insulin sliding scale for glucose coverage and monitoring, basal regimen of insulin with Levemir 5 units. The steroids will be slowly taper off, will recommend continue glucose monitoring with insulin sliding-scale. Capillary glucose 176, 403, 177 over the last 24 hours.   5. Hypertension. Patient had episodes of hypertension during her acute illness, currently off antihypertensive agents, will continue to recommend close monitoring. Systolic blood pressure 811.  6. Depression. No significant confusion or agitation, continue Seroquel, venlafaxine, as needed alprazolam.  7. Chronic pain syndrome. Patient was continued on gabapentin and hydrocodone. Physical therapy evaluation.  Discharge Diagnoses:  Active Problems:   COPD exacerbation (HCC)   Acute on chronic respiratory failure with hypoxia (HCC)   Acute respiratory failure (HCC)   Abdominal pain   Sepsis due to pneumonia ([place])   Lobar pneumonia ([place])   Acute metabolic encephalopathy    Discharge Instructions   Allergies as of 10/17/2016      Reactions   Adhesive [tape] Other (See Comments)   Skin peels off     Ciprofloxacin    Unknown reaction    Latex  Unknown reaction    Prednisone Nausea And Vomiting      Medication List    TAKE these medications   albuterol 108 (90 Base) MCG/ACT inhaler Commonly known as:  PROVENTIL HFA;VENTOLIN HFA Inhale 2 puffs into the lungs every 6 (six) hours as needed for wheezing or shortness of breath. What changed:  Another medication with the same name was added. Make sure you understand how and when to take each.   albuterol (2.5 MG/3ML) 0.083% nebulizer solution Commonly known as:  PROVENTIL Take 3 mLs (2.5 mg total) by nebulization every 4 (four) hours as needed for wheezing or shortness of breath. What changed:  You were already taking a medication with the same name, and this prescription was added. Make sure you understand how and when to take each.   ALPRAZolam 0.5 MG tablet Commonly known as:  XANAX Take 1 tablet (0.5 mg total) by mouth 3 (three) times daily as needed for anxiety. What changed:  when to take this  Another medication with the same name was removed. Continue taking this medication, and follow the directions you see here.   BREO ELLIPTA 200-25 MCG/INH Aepb Generic drug:  fluticasone furoate-vilanterol Inhale 1 puff into the lungs every evening.   cetirizine 10 MG tablet Commonly known as:  ZYRTEC Take 10 mg by mouth daily with breakfast.   desvenlafaxine 100 MG 24 hr tablet Commonly known as:  PRISTIQ Take 100 mg by mouth daily with breakfast.   feeding supplement (ENSURE ENLIVE) Liqd Take 237 mLs by mouth 2 (two) times daily between meals.   fluticasone 50 MCG/ACT nasal spray Commonly known as:  FLONASE Place 2 sprays into both nostrils daily.   gabapentin 100 MG capsule Commonly known as:  NEURONTIN Take 200 mg by mouth every 6 (six) hours.   guaiFENesin 100 MG/5ML liquid Commonly known as:  ROBITUSSIN Take 200 mg by mouth every 4 (four) hours as needed for cough.   HYDROcodone-acetaminophen 5-325 MG  tablet Commonly known as:  NORCO/VICODIN Take 1 tablet by mouth every 6 (six) hours as needed for moderate pain. What changed:  how much to take   hydroxypropyl methylcellulose / hypromellose 2.5 % ophthalmic solution Commonly known as:  ISOPTO TEARS / GONIOVISC Place 1 drop into both eyes every 12 (twelve) hours as needed for dry eyes.   magnesium hydroxide 400 MG/5ML suspension Commonly known as:  MILK OF MAGNESIA Take 30 mLs by mouth as needed for mild constipation.   omeprazole 20 MG capsule Commonly known as:  PRILOSEC Take 20 mg by mouth daily before breakfast.   oxybutynin 5 MG 24 hr tablet Commonly known as:  DITROPAN-XL Take 5 mg by mouth daily with breakfast.   OXYGEN Inhale 6 L/min into the lungs continuous.   predniSONE 20 MG tablet Commonly known as:  DELTASONE Take 2 tablets (40 mg total) by mouth daily with breakfast. Take 2 tablets daily for 2 days, then 1 tablet daily for 2 days, then half tablet daily for 2 days. Start taking on:  10/18/2016   prochlorperazine 10 MG tablet Commonly known as:  COMPAZINE Take 10 mg by mouth every 4 (four) hours as needed for nausea or vomiting.   QUEtiapine 50 MG tablet Commonly known as:  SEROQUEL Take 50 mg by mouth daily with breakfast.   QUEtiapine 300 MG tablet Commonly known as:  SEROQUEL Take 300 mg by mouth at bedtime.   senna-docusate 8.6-50 MG tablet Commonly known as:  Senokot-S Take 2 tablets by mouth 2 (  two) times daily.   theophylline 300 MG 12 hr tablet Commonly known as:  THEODUR Take 300 mg by mouth 2 (two) times daily.   tiotropium 18 MCG inhalation capsule Commonly known as:  SPIRIVA HANDIHALER Place 1 capsule (18 mcg total) into inhaler and inhale daily.   trimethoprim-polymyxin b ophthalmic solution Commonly known as:  POLYTRIM Place 1 drop into both eyes 4 (four) times daily. Started 04/05 for 7 days   Vitamin D (Ergocalciferol) 50000 units Caps capsule Commonly known as:  DRISDOL Take  50,000 Units by mouth every Sunday.       Contact information for follow-up providers    RAMASWAMY,MURALI, MD Follow up on 11/02/2016.   Specialty:  Pulmonary Disease Why:  at 1130 am  Contact information: [address] [place] [address] [phone]            Contact information for after-discharge care    [facility] Follow up.   Specialty:  Skilled Nursing Facility Contact information: [address] [address] [phone]                 Allergies  Allergen Reactions  . Adhesive [Tape] Other (See Comments)    Skin peels off   . Ciprofloxacin     Unknown reaction   . Latex     Unknown reaction   . Prednisone Nausea And Vomiting    Consultations:  Pulmonary. Critical Care   Procedures/Studies: Dg Chest 2 View  Result Date: 10/13/2016 CLINICAL DATA:  Shortness of breath and weakness today. EXAM: CHEST  2 VIEW COMPARISON:  10/08/2016 FINDINGS: There is hyperinflation of the lungs compatible with COPD. Small right pleural effusion. Linear atelectasis in the lung bases. Heart is normal size. No acute bony abnormality. IMPRESSION: COPD.  Small right effusion and bibasilar atelectasis. Electronically Signed   By: Rolm Baptise M.D.   On: 10/13/2016 10:49   Dg Chest 2 View  Result Date: 10/08/2016 CLINICAL DATA:  Patient with shortness of breath. EXAM: CHEST  2 VIEW COMPARISON:  Chest radiograph 10/06/2016 FINDINGS: Patient is rotated to the right. Monitoring leads overlie the patient. Stable cardiac and mediastinal contours. Pulmonary hyperinflation. Unchanged heterogeneous opacities bilateral lung bases, left-greater-than-right. Mid thoracic spine degenerative changes. IMPRESSION: Pulmonary hyperinflation. Persistent left-greater-than-right basilar airspace opacities which may represent pneumonia in the appropriate clinical setting. Followup PA and lateral chest X-ray is recommended in 3-4 weeks following  trial of antibiotic therapy to ensure resolution and exclude underlying malignancy. Electronically Signed   By: Lovey Newcomer M.D.   On: 10/08/2016 13:10   Dg Abd 1 View  Result Date: 10/07/2016 CLINICAL DATA:  Abdominal distention and discomfort. EXAM: ABDOMEN - 1 VIEW COMPARISON:  10/04/2016 FINDINGS: There is a moderate stool burden within the right colon. No abnormally dilated loops of small bowel or air-fluid levels identified. IMPRESSION: 1. Nonobstructive bowel gas pattern. 2. Moderate stool burden within the colon suggestive of constipation. Electronically Signed   By: Kerby Moors M.D.   On: 10/07/2016 10:58   Dg Abd 1 View  Result Date: 09/28/2016 CLINICAL DATA:  Abdominal pain and diarrhea. Abdominal distention for few days. EXAM: ABDOMEN - 1 VIEW COMPARISON:  Pelvic radiograph Oct 21, 2015 FINDINGS: Moderate retained large bowel stool. Bowel gas pattern is nondilated and nonobstructive. No intra-abdominal mass effect or pathologic calcification. Phleboliths LEFT pelvis. RIGHT femur ORIF. Soft tissue planes and included osseous structures are nonsuspicious. Osteopenia. IMPRESSION: Moderate large bowel stool, normal bowel  gas pattern. Electronically Signed   By: Elon Alas M.D.   On: 09/28/2016 21:11   Dg Chest Port 1 View  Result Date: 10/06/2016 CLINICAL DATA:  COPD, respiratory failure EXAM: PORTABLE CHEST 1 VIEW COMPARISON:  09/29/2016 FINDINGS: Cardiomediastinal silhouette is stable. Hyperinflation again noted. Persistent left basilar streaky opacification suspicious for pneumonia. There is right base medially atelectasis or infiltrate. No pulmonary edema. IMPRESSION: Hyperinflation again noted. Persistent left basilar streaky opacification suspicious for pneumonia. There is right base medially atelectasis or infiltrate. No pulmonary edema. Electronically Signed   By: Lahoma Crocker M.D.   On: 10/06/2016 11:40   Dg Chest Port 1 View  Result Date: 09/29/2016 CLINICAL DATA:  Followup  pneumonia EXAM: PORTABLE CHEST 1 VIEW COMPARISON:  Or 04/2017 FINDINGS: Heart size remains normal. Chronic aortic atherosclerosis. Background chronic fibrosis pattern with superimposed density in both lower lobes consistent with bronchopneumonia. No dense consolidation or lobar collapse. No measurable effusion. No new finding. IMPRESSION: Chronic lung disease with fibrotic pattern. Increased markings at the bases consistent with active lower lobe bronchopneumonia, similar in radiographic appearance to yesterday. Electronically Signed   By: Nelson Chimes M.D.   On: 09/29/2016 06:59   Dg Chest Port 1 View  Result Date: 09/28/2016 CLINICAL DATA:  Increased shortness of breath, wheezing and decreased oxygen saturation this morning, history COPD, diabetes mellitus, asthma, dementia EXAM: PORTABLE CHEST 1 VIEW COMPARISON:  Portable exam 0858 hours compared to 10/20/2015 FINDINGS: Normal heart size, mediastinal contours, and pulmonary vascularity. Atherosclerotic calcification aorta. Emphysematous changes with new bibasilar opacities suspicious for pneumonia. No pleural effusion or pneumothorax. Bones diffusely demineralized IMPRESSION: COPD changes with new bibasilar opacities question pneumonia. Electronically Signed   By: Lavonia Dana M.D.   On: 09/28/2016 09:12   Dg Abd 2 Views  Result Date: 10/04/2016 CLINICAL DATA:  Distended abdomen. EXAM: ABDOMEN - 2 VIEW COMPARISON:  09/28/2016. FINDINGS: Soft tissue structures are unremarkable. Air-filled loops of small large bowel noted. Mild adynamic ileus cannot be excluded. No free air identified. Degenerative changes thoracolumbar spine and left hip. Pelvic calcifications most likely phleboliths. Postsurgical changes right hip. Bibasilar subsegmental atelectasis and/or scarring. IMPRESSION: Findings suggesting mild adynamic ileus. Electronically Signed   By: Marcello Moores  Register   On: 10/04/2016 11:04       Subjective: Patient awake and alert, no nausea or vomiting,  Dyspnea improved, decreased oxygen requirements.   Discharge Exam: Vitals:   10/16/16 2017 10/17/16 0523  BP: (!) 163/100 136/72  Pulse: (!) 123 (!) 120  Resp: (!) 22 20  Temp: 98.9 F (37.2 C) 98.1 F (36.7 C)   Vitals:   10/16/16 2017 10/16/16 2135 10/17/16 0523 10/17/16 0753  BP: (!) 163/100  136/72   Pulse: (!) 123  (!) 120   Resp: (!) 22  20   Temp: 98.9 F (37.2 C)  98.1 F (36.7 C)   TempSrc: Oral  Oral   SpO2: 92% 92% 97% 96%  Weight:   57.1 kg (125 lb 14.1 oz)   Height:        General: Pt is alert, awake, not in acute distress Cardiovascular: RRR, S1/S2 +, no rubs, no gallops Respiratory: mild wheezing, with scattered rhonchi and rales.  Abdominal: Soft, NT, ND, bowel sounds + Extremities: no edema, no cyanosis    The results of significant diagnostics from this hospitalization (including imaging, microbiology, ancillary and laboratory) are listed below for reference.     Microbiology: No results found for this or any previous visit (from the past  240 hour(s)).   Labs: BNP (last 3 results)  Recent Labs  10/09/16 0906  BNP 889.1*   Basic Metabolic Panel:  Recent Labs Lab 10/13/16 0721  NA 136  K 4.6  CL 92*  CO2 33*  GLUCOSE 116*  BUN 23*  CREATININE 0.85  CALCIUM 9.7   Liver Function Tests: No results for input(s): AST, ALT, ALKPHOS, BILITOT, PROT, ALBUMIN in the last 168 hours. No results for input(s): LIPASE, AMYLASE in the last 168 hours. No results for input(s): AMMONIA in the last 168 hours. CBC:  Recent Labs Lab 10/13/16 0721  WBC 12.3*  NEUTROABS 7.8*  HGB 12.8  HCT 40.3  MCV 89.6  PLT 404*   Cardiac Enzymes: No results for input(s): CKTOTAL, CKMB, CKMBINDEX, TROPONINI in the last 168 hours. BNP: Invalid input(s): POCBNP CBG:  Recent Labs Lab 10/16/16 0730 10/16/16 1217 10/16/16 1713 10/16/16 2021 10/17/16 0731  GLUCAP 164* 388* 176* 403* 177*   D-Dimer No results for input(s): DDIMER in the last 72  hours. Hgb A1c No results for input(s): HGBA1C in the last 72 hours. Lipid Profile No results for input(s): CHOL, HDL, LDLCALC, TRIG, CHOLHDL, LDLDIRECT in the last 72 hours. Thyroid function studies No results for input(s): TSH, T4TOTAL, T3FREE, THYROIDAB in the last 72 hours.  Invalid input(s): FREET3 Anemia work up No results for input(s): VITAMINB12, FOLATE, FERRITIN, TIBC, IRON, RETICCTPCT in the last 72 hours. Urinalysis    Component Value Date/Time   COLORURINE RED (A) 10/07/2016 0925   APPEARANCEUR CLOUDY (A) 10/07/2016 0925   LABSPEC 1.015 10/07/2016 0925   PHURINE 6.0 10/07/2016 0925   GLUCOSEU 50 (A) 10/07/2016 0925   HGBUR LARGE (A) 10/07/2016 0925   BILIRUBINUR NEGATIVE 10/07/2016 0925   KETONESUR NEGATIVE 10/07/2016 0925   PROTEINUR 100 (A) 10/07/2016 0925   UROBILINOGEN 0.2 03/26/2015 1146   NITRITE NEGATIVE 10/07/2016 0925   LEUKOCYTESUR TRACE (A) 10/07/2016 0925   Sepsis Labs Invalid input(s): PROCALCITONIN,  WBC,  LACTICIDVEN Microbiology No results found for this or any previous visit (from the past 240 hour(s)).   Time coordinating discharge: 45 minutes  SIGNED:   Tawni Millers, MD  [facility] 10/17/2016, 11:11 AM Pager   If 7PM-7AM, please contact night-coverage [email or web address] Password [email or web address]

## 2016-10-17 NOTE — Progress Notes (Addendum)
Patient refused to sign discharge forms. Report called to nurse at [facility]. Patient is stable at discharge.

## 2016-10-17 NOTE — Progress Notes (Addendum)
CSW following for discharge needs to SNF-[facility].   12:15pm Patient ready for discharge, clinical information faxed to [facility]. Facility informed. Patient son informed. Patient will transport by [facility].   Kathrin Greathouse, Latanya Presser, MSW Clinical Social Worker 5E and Psychiatric Service Line [phone] 10/17/2016  10:25 AM

## 2016-11-02 ENCOUNTER — Other Ambulatory Visit: Payer: Medicare Other

## 2016-11-02 ENCOUNTER — Ambulatory Visit (INDEPENDENT_AMBULATORY_CARE_PROVIDER_SITE_OTHER): Payer: Medicare Other | Admitting: Internal Medicine

## 2016-11-02 ENCOUNTER — Encounter: Payer: Self-pay | Admitting: Internal Medicine

## 2016-11-02 ENCOUNTER — Ambulatory Visit (INDEPENDENT_AMBULATORY_CARE_PROVIDER_SITE_OTHER)
Admission: RE | Admit: 2016-11-02 | Discharge: 2016-11-02 | Disposition: A | Discharge status: Home / Self Care | Payer: Medicare Other | Source: Ambulatory Visit | Attending: Internal Medicine | Admitting: Internal Medicine

## 2016-11-02 DIAGNOSIS — J189 Pneumonia, unspecified organism: Secondary | ICD-10-CM

## 2016-11-02 DIAGNOSIS — K5900 Constipation, unspecified: Secondary | ICD-10-CM | POA: Insufficient documentation

## 2016-11-02 DIAGNOSIS — K5909 Other constipation: Secondary | ICD-10-CM

## 2016-11-02 DIAGNOSIS — J449 Chronic obstructive pulmonary disease, unspecified: Secondary | ICD-10-CM

## 2016-11-02 NOTE — Patient Instructions (Signed)
CAP (community acquired pneumonia) Clinically improved 11/02/2016 compared to April 2018  Plan Do cxr 11/02/2016 to ensure resolution  Stage 4 very severe COPD by GOLD classification ([place]) Improved from flare up in April 2018 but you have severe disease and are still very deconditioned  Plan Continue o2 , spiriva, nebs  And prednisone per nursing home scheduled They can slowly taper prednisone Continue theophylline but wil check blood level 11/02/2016 Wil check alpha 1 genetic test for copd 11/02/2016   Followup 4 weeks with nurse practitioners Judson Roch or Tammy   Constipation Please talk to [place] or PCP [name], NP about this

## 2016-11-02 NOTE — Assessment & Plan Note (Signed)
Please talk to SNR or PCP [name], NP about this

## 2016-11-02 NOTE — Addendum Note (Signed)
Addended by: Valerie Salts on: 11/02/2016 12:10 PM   Modules accepted: Orders

## 2016-11-02 NOTE — Assessment & Plan Note (Addendum)
Improved from flare up in April 2018 but you have severe disease and are still very deconditioned  Plan Continue o2 , spiriva, nebs  And prednisone per nursing home scheduled They can slowly taper prednisone Continue theophylline but wil check blood level 11/02/2016 Wil check alpha 1 genetic test for copd 11/02/2016   Followup 4 weeks with nurse practitioners Judson Roch or Lynelle Smoke

## 2016-11-02 NOTE — Assessment & Plan Note (Signed)
Clinically improved 11/02/2016 compared to April 2018  Plan Do cxr 11/02/2016 to ensure resolution

## 2016-11-02 NOTE — Progress Notes (Signed)
Subjective:     Patient ID: Traci Thomas, female   DOB: Sep 07, 1950, 66 y.o.   MRN: [id number]   HPI   OV 11/02/2016  Chief Complaint  Patient presents with  . Hospitalization Follow-up    on 3L of O2, respiratory failure and COPD exacerbation, pt has many complaints but states her breathing is ok, she feels she is not being taken care of properly,     Hospital follow up for this 66 year old patient with advanced COPD. Review of the chart shows she was hospitalized mid-April 2018 to [facility] hospital for a couple weeks including intensive care admission for COPD exacerbation and community by pneumonia. She denies being on the ventilator. She was discharged to a skilled nursing facility because he is homeless and also because she was significantly deconditioned. She tells me that she still at the skilled nursing facility although exercise therapy has ended. She says they are unable to discharge her because of her homeless state but she also says that she still deconditioned. Review of the medication shows Spiriva nebulizers oxygen which she is on currently 3 L and chronic prednisone at a higher dose. It is unclear to me why she is on the higher dose of prednisone. She is complaining of constipation which is chronic and she wants me to address this. Overall she did admit that her symptoms have improved since discharge from the hospital although she is not at her baseline as yet. Chest x-ray review from the hospital shows clearing infiltrates last chest x-ray was April. Lab work shows normal creatinine in April 2018    has a past medical history of Asthma; Back pain; COPD (chronic obstructive pulmonary disease) ([place]); and Diabetes mellitus without complication ([place]).   reports that she has quit smoking. Her smoking use included Cigarettes. She smoked 1.00 pack per day. She has never used smokeless tobacco.  Past Surgical History:  Procedure Laterality Date  . APPENDECTOMY    . FEMUR IM NAIL  Right 10/21/2015   Procedure: INTRAMEDULLARY (IM) NAIL FEMORAL;  Surgeon: Meredith Pel, MD;  Location: [facility];  Service: Orthopedics;  Laterality: Right;  . TONSILLECTOMY      Allergies  Allergen Reactions  . Adhesive [Tape] Other (See Comments)    Skin peels off   . Ciprofloxacin     Unknown reaction   . Latex     Unknown reaction   . Prednisone Nausea And Vomiting     There is no immunization history on file for this patient.  Family History  Problem Relation Age of Onset  . COPD Mother   . Diabetes Unknown      Current Outpatient Prescriptions:  .  albuterol (PROVENTIL HFA;VENTOLIN HFA) 108 (90 BASE) MCG/ACT inhaler, Inhale 2 puffs into the lungs every 6 (six) hours as needed for wheezing or shortness of breath., Disp: , Rfl:  .  albuterol (PROVENTIL) (2.5 MG/3ML) 0.083% nebulizer solution, Take 3 mLs (2.5 mg total) by nebulization every 4 (four) hours as needed for wheezing or shortness of breath., Disp: 75 mL, Rfl: 12 .  ALPRAZolam (XANAX) 0.5 MG tablet, Take 1 tablet (0.5 mg total) by mouth 3 (three) times daily as needed for anxiety., Disp: 10 tablet, Rfl: 0 .  cetirizine (ZYRTEC) 10 MG tablet, Take 10 mg by mouth daily with breakfast., Disp: , Rfl:  .  desvenlafaxine (PRISTIQ) 100 MG 24 hr tablet, Take 100 mg by mouth daily with breakfast. , Disp: , Rfl:  .  feeding supplement, ENSURE ENLIVE, (  ENSURE ENLIVE) LIQD, Take 237 mLs by mouth 2 (two) times daily between meals., Disp: 237 mL, Rfl: 12 .  fluticasone (FLONASE) 50 MCG/ACT nasal spray, Place 2 sprays into both nostrils daily., Disp: , Rfl:  .  fluticasone furoate-vilanterol (BREO ELLIPTA) 200-25 MCG/INH AEPB, Inhale 1 puff into the lungs every evening., Disp: , Rfl:  .  gabapentin (NEURONTIN) 100 MG capsule, Take 200 mg by mouth every 6 (six) hours., Disp: , Rfl:  .  guaiFENesin (ROBITUSSIN) 100 MG/5ML liquid, Take 200 mg by mouth every 4 (four) hours as needed for cough., Disp: , Rfl:  .   HYDROcodone-acetaminophen (NORCO/VICODIN) 5-325 MG tablet, Take 1 tablet by mouth every 6 (six) hours as needed for moderate pain., Disp: 10 tablet, Rfl: 0 .  hydroxypropyl methylcellulose / hypromellose (ISOPTO TEARS / GONIOVISC) 2.5 % ophthalmic solution, Place 1 drop into both eyes every 12 (twelve) hours as needed for dry eyes., Disp: , Rfl:  .  insulin aspart (NOVOLOG) 100 UNIT/ML injection, Inject 0-15 Units into the skin 3 (three) times daily with meals. Glucose 150 to 200 use 2 units, for 201 to 250 use 4 units, for 251 to 300 use 6 units, for 301 to 350 use 8 units, for 351 or greater use 10 units., Disp: 10 mL, Rfl: 11 .  magnesium hydroxide (MILK OF MAGNESIA) 400 MG/5ML suspension, Take 30 mLs by mouth as needed for mild constipation., Disp: , Rfl:  .  omeprazole (PRILOSEC) 20 MG capsule, Take 20 mg by mouth daily before breakfast., Disp: , Rfl:  .  oxybutynin (DITROPAN-XL) 5 MG 24 hr tablet, Take 5 mg by mouth daily with breakfast., Disp: , Rfl:  .  OXYGEN, Inhale 6 L/min into the lungs continuous., Disp: , Rfl:  .  predniSONE (DELTASONE) 20 MG tablet, Take 2 tablets (40 mg total) by mouth daily with breakfast. Take 2 tablets daily for 2 days, then 1 tablet daily for 2 days, then half tablet daily for 2 days., Disp: 7 tablet, Rfl: 0 .  prochlorperazine (COMPAZINE) 10 MG tablet, Take 10 mg by mouth every 4 (four) hours as needed for nausea or vomiting., Disp: , Rfl:  .  QUEtiapine (SEROQUEL) 300 MG tablet, Take 300 mg by mouth at bedtime., Disp: , Rfl:  .  QUEtiapine (SEROQUEL) 50 MG tablet, Take 50 mg by mouth daily with breakfast., Disp: , Rfl:  .  senna-docusate (SENOKOT-S) 8.6-50 MG tablet, Take 2 tablets by mouth 2 (two) times daily., Disp: 20 tablet, Rfl: 0 .  theophylline (THEODUR) 300 MG 12 hr tablet, Take 300 mg by mouth 2 (two) times daily., Disp: , Rfl:  .  tiotropium (SPIRIVA HANDIHALER) 18 MCG inhalation capsule, Place 1 capsule (18 mcg total) into inhaler and inhale daily.,  Disp: 30 capsule, Rfl: 12 .  trimethoprim-polymyxin b (POLYTRIM) ophthalmic solution, Place 1 drop into both eyes 4 (four) times daily. Started 04/05 for 7 days, Disp: , Rfl:  .  Vitamin D, Ergocalciferol, (DRISDOL) 50000 units CAPS capsule, Take 50,000 Units by mouth every Sunday. , Disp: , Rfl:    Review of Systems     Objective:   Physical Exam Vitals:   11/02/16 1138  BP: 126/72  Pulse: (!) 127  SpO2: 94%  Weight: 130 lb 12.8 oz (59.3 kg)  Height: 4' 11.5" (1.511 m)    Estimated body mass index is 25.98 kg/m as calculated from the following:   Height as of this encounter: 4' 11.5" (1.511 m).   Weight as of this  encounter: 130 lb 12.8 oz (59.3 kg).  Frail deconditioned female sitting in the wheelchair. Has classic barrel chest with purse lip breathing prolonged expiration or wheeze crackles. Able to talk full sentences. Somewhat cushingoid. Mild pedal edema. Dry skin. Alert and oriented 3. Somewhat anxious.    Assessment:       ICD-9-CM ICD-10-CM   1. Community acquired pneumonia, unspecified laterality 9 J18.9 DG Chest 2 View  2. Stage 4 very severe COPD by GOLD classification ([place]) 496 J44.9 Alpha-1 antitrypsin phenotype  3. Other constipation 564.09 K59.09        Plan:     CAP (community acquired pneumonia) Clinically improved 11/02/2016 compared to April 2018  Plan Do cxr 11/02/2016 to ensure resolution  Stage 4 very severe COPD by GOLD classification ([place]) Improved from flare up in April 2018 but you have severe disease and are still very deconditioned  Plan Continue o2 , spiriva, nebs  And prednisone per nursing home scheduled They can slowly taper prednisone Continue theophylline but wil check blood level 11/02/2016 Wil check alpha 1 genetic test for copd 11/02/2016   Followup 4 weeks with nurse practitioners Judson Roch or Tammy   Constipation Please talk to SNR or PCP [name], NP about this     > 50% of this > 25 min visit spent in face to  face counseling or coordination of care    Dr. Brand Males, M.D., [facility].C.P Pulmonary and Critical Care Medicine Staff Physician [facility] Pager: [phone], If no answer or between  15:00h - 7:00h: call [phone]  11/02/2016 12:09 PM

## 2016-11-03 LAB — THEOPHYLLINE LEVEL: Theophylline Lvl: 13 mg/L (ref 10.0–20.0)

## 2016-11-06 LAB — ALPHA-1 ANTITRYPSIN PHENOTYPE: A-1 Antitrypsin: 152 mg/dL (ref 83–199)

## 2016-11-30 ENCOUNTER — Encounter: Payer: Self-pay | Admitting: Adult Health

## 2016-11-30 ENCOUNTER — Ambulatory Visit (INDEPENDENT_AMBULATORY_CARE_PROVIDER_SITE_OTHER): Payer: Medicare Other | Admitting: Adult Health

## 2016-11-30 DIAGNOSIS — J449 Chronic obstructive pulmonary disease, unspecified: Secondary | ICD-10-CM | POA: Diagnosis not present

## 2016-11-30 DIAGNOSIS — J9611 Chronic respiratory failure with hypoxia: Secondary | ICD-10-CM

## 2016-11-30 NOTE — Assessment & Plan Note (Signed)
Cont on O2 .  

## 2016-11-30 NOTE — Assessment & Plan Note (Signed)
Recent flare now resolved   Plan  Patient Instructions  Continue on BREO daily  Continue on Spiriva daily .  Continue on Oxygen 3l/m .  May use Albuterol Neb every 4hr as needed .  Follow up with Dr. Chase Caller in 3 months and As needed   Please contact office for sooner follow up if symptoms do not improve or worsen or seek emergency care

## 2016-11-30 NOTE — Patient Instructions (Signed)
Continue on BREO daily  Continue on Spiriva daily .  Continue on Oxygen 3l/m .  May use Albuterol Neb every 4hr as needed .  Follow up with Dr. Chase Caller in 3 months and As needed   Please contact office for sooner follow up if symptoms do not improve or worsen or seek emergency care

## 2016-11-30 NOTE — Progress Notes (Signed)
@Patient  ID: Traci Thomas, female    DOB: May 20, 1951, 66 y.o.   MRN: [id number]  Chief Complaint  Patient presents with  . Follow-up    PNA     Referring provider: [name], NP  HPI: 66 year old female, smoker, with a known severe COPD and chronic respiratory failure on home oxygen  11/30/2016 Follow up : PNA/COPD /O2 RF /D CHF  Patient returns for a one-month follow-up. Patient was admitted in April this year for COPD exacerbation, ammonia and decompensated diastolic heart failure.  She was treated with IV antibiotics, steroids, and diuresis.  She was seen last visit with a follow-up chest x-ray that showed clearance of pneumonia. She remains on BREO and spiriva. She is on Theophylline . Level was normal .  Remains on Oxygen 3l/m . Feels breathing is getting better. Less cough or dyspnea.  Has Albuterol neb As needed  .  She wants to go home but has been in NH for last year.  She has a partially filled out DNR paper from NH . She says she wants me to throw away , she does not want a DNR now. We discussed this and she wants full code status right now.     Allergies  Allergen Reactions  . Adhesive [Tape] Other (See Comments)    Skin peels off   . Ciprofloxacin     Unknown reaction   . Latex     Unknown reaction   . Prednisone Nausea And Vomiting     There is no immunization history on file for this patient.  Past Medical History:  Diagnosis Date  . Asthma   . Back pain   . COPD (chronic obstructive pulmonary disease) ([facility])    Home O2 3lpm, theophylline  . Diabetes mellitus without complication ([place])     Tobacco History: History  Smoking Status  . Former Smoker  . Packs/day: 1.00  . Types: Cigarettes  . Quit date: 2016  Smokeless Tobacco  . Never Used    Comment: started smoking 2017 to current sometimes 1 daily or once a month   Counseling given: Not Answered   Outpatient Encounter Prescriptions as of 11/30/2016  Medication Sig  . albuterol  (PROVENTIL HFA;VENTOLIN HFA) 108 (90 BASE) MCG/ACT inhaler Inhale 2 puffs into the lungs every 6 (six) hours as needed for wheezing or shortness of breath.  Marland Kitchen albuterol (PROVENTIL) (2.5 MG/3ML) 0.083% nebulizer solution Take 3 mLs (2.5 mg total) by nebulization every 4 (four) hours as needed for wheezing or shortness of breath.  . ALPRAZolam (XANAX) 0.5 MG tablet Take 1 tablet (0.5 mg total) by mouth 3 (three) times daily as needed for anxiety.  . cetirizine (ZYRTEC) 10 MG tablet Take 10 mg by mouth daily with breakfast.  . desvenlafaxine (PRISTIQ) 100 MG 24 hr tablet Take 100 mg by mouth daily with breakfast.   . fluticasone (FLONASE) 50 MCG/ACT nasal spray Place 2 sprays into both nostrils daily.  . fluticasone furoate-vilanterol (BREO ELLIPTA) 200-25 MCG/INH AEPB Inhale 1 puff into the lungs every evening.  . gabapentin (NEURONTIN) 100 MG capsule Take 200 mg by mouth every 6 (six) hours.  Marland Kitchen HYDROcodone-acetaminophen (NORCO/VICODIN) 5-325 MG tablet Take 1 tablet by mouth every 6 (six) hours as needed for moderate pain.  . hydroxypropyl methylcellulose / hypromellose (ISOPTO TEARS / GONIOVISC) 2.5 % ophthalmic solution Place 1 drop into both eyes every 12 (twelve) hours as needed for dry eyes.  . insulin aspart (NOVOLOG) 100 UNIT/ML injection Inject 0-15 Units  into the skin 3 (three) times daily with meals. Glucose 150 to 200 use 2 units, for 201 to 250 use 4 units, for 251 to 300 use 6 units, for 301 to 350 use 8 units, for 351 or greater use 10 units.  . magnesium hydroxide (MILK OF MAGNESIA) 400 MG/5ML suspension Take 30 mLs by mouth as needed for mild constipation.  Marland Kitchen oxybutynin (DITROPAN-XL) 5 MG 24 hr tablet Take 5 mg by mouth daily with breakfast.  . OXYGEN Inhale 6 L/min into the lungs continuous.  . prochlorperazine (COMPAZINE) 10 MG tablet Take 10 mg by mouth every 4 (four) hours as needed for nausea or vomiting.  Marland Kitchen QUEtiapine (SEROQUEL) 300 MG tablet Take 300 mg by mouth at bedtime.  Marland Kitchen  QUEtiapine (SEROQUEL) 50 MG tablet Take 50 mg by mouth daily with breakfast.  . theophylline (THEODUR) 300 MG 12 hr tablet Take 300 mg by mouth 2 (two) times daily.  . Vitamin D, Ergocalciferol, (DRISDOL) 50000 units CAPS capsule Take 50,000 Units by mouth every Sunday.   . feeding supplement, ENSURE ENLIVE, (ENSURE ENLIVE) LIQD Take 237 mLs by mouth 2 (two) times daily between meals. (Patient not taking: Reported on 11/30/2016)  . guaiFENesin (ROBITUSSIN) 100 MG/5ML liquid Take 200 mg by mouth every 4 (four) hours as needed for cough.  Marland Kitchen omeprazole (PRILOSEC) 20 MG capsule Take 20 mg by mouth daily before breakfast.  . predniSONE (DELTASONE) 20 MG tablet Take 2 tablets (40 mg total) by mouth daily with breakfast. Take 2 tablets daily for 2 days, then 1 tablet daily for 2 days, then half tablet daily for 2 days. (Patient not taking: Reported on 11/30/2016)  . senna-docusate (SENOKOT-S) 8.6-50 MG tablet Take 2 tablets by mouth 2 (two) times daily. (Patient not taking: Reported on 11/30/2016)  . tiotropium (SPIRIVA HANDIHALER) 18 MCG inhalation capsule Place 1 capsule (18 mcg total) into inhaler and inhale daily. (Patient not taking: Reported on 11/30/2016)  . trimethoprim-polymyxin b (POLYTRIM) ophthalmic solution Place 1 drop into both eyes 4 (four) times daily. Started 04/05 for 7 days   No facility-administered encounter medications on file as of 11/30/2016.      Review of Systems  Constitutional:   No  weight loss, night sweats,  Fevers, chills, fatigue, or  lassitude.  HEENT:   No headaches,  Difficulty swallowing,  Tooth/dental problems, or  Sore throat,                No sneezing, itching, ear ache, nasal congestion, post nasal drip,   CV:  No chest pain,  Orthopnea, PND, swelling in lower extremities, anasarca, dizziness, palpitations, syncope.   GI  No heartburn, indigestion, abdominal pain, nausea, vomiting, diarrhea, change in bowel habits, loss of appetite, bloody stools.   Resp: No  shortness of breath with exertion or at rest.  No excess mucus, no productive cough,  No non-productive cough,  No coughing up of blood.  No change in color of mucus.  No wheezing.  No chest wall deformity  Skin: no rash or lesions.  GU: no dysuria, change in color of urine, no urgency or frequency.  No flank pain, no hematuria   MS:  No joint pain or swelling.  No decreased range of motion.  No back pain.    Physical Exam  BP 124/86 (BP Location: Left Arm, Cuff Size: Normal)   Pulse (!) 117   Ht 4' 11.5" (1.511 m)   Wt 133 lb 15.7 oz (60.8 kg)   SpO2 94%  BMI 26.61 kg/m   GEN: A/Ox3; pleasant , NAD, eldelry on O2 in wc    HEENT:  [place]/AT,  EACs-clear, TMs-wnl, NOSE-clear, THROAT-clear, no lesions, no postnasal drip or exudate noted.   NECK:  Supple w/ fair ROM; no JVD; normal carotid impulses w/o bruits; no thyromegaly or nodules palpated; no lymphadenopathy.    RESP  Decreased BS in bases . no accessory muscle use, no dullness to percussion  CARD:  RRR, no m/r/g, no peripheral edema, pulses intact, no cyanosis or clubbing.  GI:   Soft & nt; nml bowel sounds; no organomegaly or masses detected.   Musco: Warm bil, no deformities or joint swelling noted.   Neuro: alert, no focal deficits noted.    Skin: Warm, no lesions or rashes    Lab Results:BMET  ProBNP No results found for: PROBNP  Imaging: Dg Chest 2 View  Result Date: 11/02/2016 CLINICAL DATA:  Recent pneumonia. EXAM: CHEST  2 VIEW COMPARISON:  October 13, 2016 FINDINGS: There is evidence of underlying COPD with hyperexpansion and apparent bullous disease in the upper lobes. There is interstitial edema in the lung bases bilaterally. There is no airspace consolidation. There is a minimal pleural effusion on the left. Heart is upper normal in size. The appearance of the pulmonary vascularity is consistent with mild volume overload superimposed on underlying emphysema. There is aortic atherosclerosis. No adenopathy. No  evident bone lesions. IMPRESSION: No airspace consolidation. Evidence of a degree of congestive heart failure superimposed on COPD. No adenopathy. There is aortic atherosclerosis. Electronically Signed   By: Lowella Grip III M.D.   On: 11/02/2016 13:15     Assessment & Plan:   Stage 4 very severe COPD by GOLD classification ([place]) Recent flare now resolved   Plan  Patient Instructions  Continue on BREO daily  Continue on Spiriva daily .  Continue on Oxygen 3l/m .  May use Albuterol Neb every 4hr as needed .  Follow up with Dr. Chase Caller in 3 months and As needed   Please contact office for sooner follow up if symptoms do not improve or worsen or seek emergency care      Chronic respiratory failure with hypoxia ([place]) Cont on O2      Tammy Parrett, NP 11/30/2016

## 2017-01-03 ENCOUNTER — Encounter (HOSPITAL_COMMUNITY): Payer: Medicare Other

## 2017-01-03 ENCOUNTER — Emergency Department (HOSPITAL_COMMUNITY): Payer: Medicare Other

## 2017-01-03 ENCOUNTER — Encounter (HOSPITAL_COMMUNITY): Payer: Self-pay | Admitting: Emergency Medicine

## 2017-01-03 ENCOUNTER — Inpatient Hospital Stay (HOSPITAL_COMMUNITY)
Admission: EM | Admit: 2017-01-03 | Discharge: 2017-01-17 | DRG: 853 | Disposition: A | Discharge status: Home / Self Care | Payer: Medicare Other | Attending: Family Medicine | Admitting: Family Medicine

## 2017-01-03 DIAGNOSIS — Z66 Do not resuscitate: Secondary | ICD-10-CM | POA: Diagnosis not present

## 2017-01-03 DIAGNOSIS — K5900 Constipation, unspecified: Secondary | ICD-10-CM | POA: Diagnosis present

## 2017-01-03 DIAGNOSIS — Z0189 Encounter for other specified special examinations: Secondary | ICD-10-CM | POA: Diagnosis not present

## 2017-01-03 DIAGNOSIS — N132 Hydronephrosis with renal and ureteral calculous obstruction: Secondary | ICD-10-CM | POA: Diagnosis present

## 2017-01-03 DIAGNOSIS — R000 Tachycardia, unspecified: Secondary | ICD-10-CM | POA: Diagnosis present

## 2017-01-03 DIAGNOSIS — Z79899 Other long term (current) drug therapy: Secondary | ICD-10-CM

## 2017-01-03 DIAGNOSIS — J189 Pneumonia, unspecified organism: Secondary | ICD-10-CM

## 2017-01-03 DIAGNOSIS — J9601 Acute respiratory failure with hypoxia: Secondary | ICD-10-CM | POA: Diagnosis present

## 2017-01-03 DIAGNOSIS — F0390 Unspecified dementia without behavioral disturbance: Secondary | ICD-10-CM | POA: Diagnosis present

## 2017-01-03 DIAGNOSIS — F329 Major depressive disorder, single episode, unspecified: Secondary | ICD-10-CM | POA: Diagnosis present

## 2017-01-03 DIAGNOSIS — F419 Anxiety disorder, unspecified: Secondary | ICD-10-CM | POA: Diagnosis present

## 2017-01-03 DIAGNOSIS — J9622 Acute and chronic respiratory failure with hypercapnia: Secondary | ICD-10-CM | POA: Diagnosis present

## 2017-01-03 DIAGNOSIS — I110 Hypertensive heart disease with heart failure: Secondary | ICD-10-CM | POA: Diagnosis present

## 2017-01-03 DIAGNOSIS — J181 Lobar pneumonia, unspecified organism: Secondary | ICD-10-CM | POA: Diagnosis not present

## 2017-01-03 DIAGNOSIS — G92 Toxic encephalopathy: Secondary | ICD-10-CM | POA: Diagnosis not present

## 2017-01-03 DIAGNOSIS — G253 Myoclonus: Secondary | ICD-10-CM | POA: Diagnosis present

## 2017-01-03 DIAGNOSIS — I5033 Acute on chronic diastolic (congestive) heart failure: Secondary | ICD-10-CM | POA: Diagnosis present

## 2017-01-03 DIAGNOSIS — J9602 Acute respiratory failure with hypercapnia: Secondary | ICD-10-CM

## 2017-01-03 DIAGNOSIS — Z794 Long term (current) use of insulin: Secondary | ICD-10-CM | POA: Diagnosis not present

## 2017-01-03 DIAGNOSIS — D649 Anemia, unspecified: Secondary | ICD-10-CM | POA: Diagnosis present

## 2017-01-03 DIAGNOSIS — I959 Hypotension, unspecified: Secondary | ICD-10-CM | POA: Diagnosis present

## 2017-01-03 DIAGNOSIS — E872 Acidosis: Secondary | ICD-10-CM | POA: Diagnosis present

## 2017-01-03 DIAGNOSIS — Y95 Nosocomial condition: Secondary | ICD-10-CM | POA: Diagnosis present

## 2017-01-03 DIAGNOSIS — A419 Sepsis, unspecified organism: Secondary | ICD-10-CM | POA: Diagnosis present

## 2017-01-03 DIAGNOSIS — J15212 Pneumonia due to Methicillin resistant Staphylococcus aureus: Secondary | ICD-10-CM | POA: Diagnosis present

## 2017-01-03 DIAGNOSIS — Z825 Family history of asthma and other chronic lower respiratory diseases: Secondary | ICD-10-CM

## 2017-01-03 DIAGNOSIS — J441 Chronic obstructive pulmonary disease with (acute) exacerbation: Secondary | ICD-10-CM | POA: Diagnosis present

## 2017-01-03 DIAGNOSIS — J440 Chronic obstructive pulmonary disease with acute lower respiratory infection: Secondary | ICD-10-CM | POA: Diagnosis present

## 2017-01-03 DIAGNOSIS — Z881 Allergy status to other antibiotic agents status: Secondary | ICD-10-CM

## 2017-01-03 DIAGNOSIS — J449 Chronic obstructive pulmonary disease, unspecified: Secondary | ICD-10-CM | POA: Diagnosis present

## 2017-01-03 DIAGNOSIS — J9600 Acute respiratory failure, unspecified whether with hypoxia or hypercapnia: Secondary | ICD-10-CM

## 2017-01-03 DIAGNOSIS — Z9981 Dependence on supplemental oxygen: Secondary | ICD-10-CM

## 2017-01-03 DIAGNOSIS — J9621 Acute and chronic respiratory failure with hypoxia: Secondary | ICD-10-CM | POA: Diagnosis present

## 2017-01-03 DIAGNOSIS — N1330 Unspecified hydronephrosis: Secondary | ICD-10-CM | POA: Diagnosis not present

## 2017-01-03 DIAGNOSIS — E1142 Type 2 diabetes mellitus with diabetic polyneuropathy: Secondary | ICD-10-CM | POA: Diagnosis present

## 2017-01-03 DIAGNOSIS — Z7189 Other specified counseling: Secondary | ICD-10-CM | POA: Diagnosis not present

## 2017-01-03 DIAGNOSIS — R310 Gross hematuria: Secondary | ICD-10-CM | POA: Diagnosis not present

## 2017-01-03 DIAGNOSIS — R4182 Altered mental status, unspecified: Secondary | ICD-10-CM

## 2017-01-03 DIAGNOSIS — Z833 Family history of diabetes mellitus: Secondary | ICD-10-CM

## 2017-01-03 DIAGNOSIS — M79609 Pain in unspecified limb: Secondary | ICD-10-CM | POA: Diagnosis not present

## 2017-01-03 DIAGNOSIS — E119 Type 2 diabetes mellitus without complications: Secondary | ICD-10-CM | POA: Diagnosis not present

## 2017-01-03 DIAGNOSIS — J9611 Chronic respiratory failure with hypoxia: Secondary | ICD-10-CM | POA: Diagnosis not present

## 2017-01-03 DIAGNOSIS — R1900 Intra-abdominal and pelvic swelling, mass and lump, unspecified site: Secondary | ICD-10-CM | POA: Diagnosis not present

## 2017-01-03 DIAGNOSIS — Z515 Encounter for palliative care: Secondary | ICD-10-CM

## 2017-01-03 DIAGNOSIS — Z1623 Resistance to quinolones and fluoroquinolones: Secondary | ICD-10-CM

## 2017-01-03 DIAGNOSIS — R319 Hematuria, unspecified: Secondary | ICD-10-CM

## 2017-01-03 DIAGNOSIS — E876 Hypokalemia: Secondary | ICD-10-CM | POA: Diagnosis present

## 2017-01-03 DIAGNOSIS — Z91048 Other nonmedicinal substance allergy status: Secondary | ICD-10-CM

## 2017-01-03 DIAGNOSIS — Z781 Physical restraint status: Secondary | ICD-10-CM

## 2017-01-03 DIAGNOSIS — Z87891 Personal history of nicotine dependence: Secondary | ICD-10-CM

## 2017-01-03 DIAGNOSIS — Z91040 Latex allergy status: Secondary | ICD-10-CM

## 2017-01-03 LAB — I-STAT CG4 LACTIC ACID, ED: LACTIC ACID, VENOUS: 0.78 mmol/L (ref 0.5–1.9)

## 2017-01-03 LAB — GLUCOSE, CAPILLARY
GLUCOSE-CAPILLARY: 94 mg/dL (ref 65–99)
Glucose-Capillary: 152 mg/dL — ABNORMAL HIGH (ref 65–99)
Glucose-Capillary: 158 mg/dL — ABNORMAL HIGH (ref 65–99)
Glucose-Capillary: 242 mg/dL — ABNORMAL HIGH (ref 65–99)
Glucose-Capillary: 316 mg/dL — ABNORMAL HIGH (ref 65–99)

## 2017-01-03 LAB — CBC WITH DIFFERENTIAL/PLATELET
Basophils Absolute: 0 10*3/uL (ref 0.0–0.1)
Basophils Relative: 0 %
EOS PCT: 2 %
Eosinophils Absolute: 0.2 10*3/uL (ref 0.0–0.7)
HEMATOCRIT: 32.4 % — AB (ref 36.0–46.0)
Hemoglobin: 9.9 g/dL — ABNORMAL LOW (ref 12.0–15.0)
LYMPHS ABS: 0.9 10*3/uL (ref 0.7–4.0)
LYMPHS PCT: 8 %
MCH: 27.8 pg (ref 26.0–34.0)
MCHC: 30.6 g/dL (ref 30.0–36.0)
MCV: 91 fL (ref 78.0–100.0)
MONO ABS: 0.7 10*3/uL (ref 0.1–1.0)
Monocytes Relative: 7 %
NEUTROS ABS: 8.8 10*3/uL — AB (ref 1.7–7.7)
Neutrophils Relative %: 83 %
PLATELETS: 292 10*3/uL (ref 150–400)
RBC: 3.56 MIL/uL — ABNORMAL LOW (ref 3.87–5.11)
RDW: 16.1 % — AB (ref 11.5–15.5)
WBC: 10.6 10*3/uL — ABNORMAL HIGH (ref 4.0–10.5)

## 2017-01-03 LAB — COMPREHENSIVE METABOLIC PANEL
ALBUMIN: 3.2 g/dL — AB (ref 3.5–5.0)
ALT: 9 U/L — AB (ref 14–54)
AST: 10 U/L — AB (ref 15–41)
Alkaline Phosphatase: 77 U/L (ref 38–126)
Anion gap: 9 (ref 5–15)
BUN: 15 mg/dL (ref 6–20)
CHLORIDE: 97 mmol/L — AB (ref 101–111)
CO2: 35 mmol/L — ABNORMAL HIGH (ref 22–32)
CREATININE: 0.87 mg/dL (ref 0.44–1.00)
Calcium: 9 mg/dL (ref 8.9–10.3)
GFR calc Af Amer: >60 mL/min (ref >60)
GLUCOSE: 237 mg/dL — AB (ref 65–99)
POTASSIUM: 3.7 mmol/L (ref 3.5–5.1)
Sodium: 141 mmol/L (ref 135–145)
Total Bilirubin: <0.1 mg/dL — ABNORMAL LOW (ref 0.3–1.2)
Total Protein: 6.9 g/dL (ref 6.5–8.1)

## 2017-01-03 LAB — I-STAT TROPONIN, ED: TROPONIN I, POC: 0 ng/mL (ref 0.00–0.08)

## 2017-01-03 LAB — BLOOD GAS, ARTERIAL
Acid-Base Excess: 8.6 mmol/L — ABNORMAL HIGH (ref 0.0–2.0)
BICARBONATE: 36.5 mmol/L — AB (ref 20.0–28.0)
DRAWN BY: 11249
O2 Content: 5 L/min
O2 Saturation: 88.4 %
PH ART: 7.295 — AB (ref 7.350–7.450)
Patient temperature: 100.1
pCO2 arterial: 78.4 mmHg (ref 32.0–48.0)
pO2, Arterial: 62.7 mmHg — ABNORMAL LOW (ref 83.0–108.0)

## 2017-01-03 LAB — EXPECTORATED SPUTUM ASSESSMENT W REFEX TO RESP CULTURE

## 2017-01-03 LAB — PROTIME-INR
INR: 1.08
Prothrombin Time: 14 seconds (ref 11.4–15.2)

## 2017-01-03 LAB — EXPECTORATED SPUTUM ASSESSMENT W GRAM STAIN, RFLX TO RESP C

## 2017-01-03 LAB — MRSA PCR SCREENING: MRSA BY PCR: NEGATIVE

## 2017-01-03 MED ORDER — LORAZEPAM 2 MG/ML IJ SOLN
0.5000 mg | Freq: Three times a day (TID) | INTRAMUSCULAR | Status: DC | PRN
  Administered 2017-01-03 – 2017-01-04 (×3): 0.5 mg via INTRAVENOUS
  Filled 2017-01-03 (×3): qty 1

## 2017-01-03 MED ORDER — SENNA 8.6 MG PO TABS
1.0000 | ORAL_TABLET | Freq: Two times a day (BID) | ORAL | Status: DC
  Administered 2017-01-03 – 2017-01-05 (×5): 8.6 mg via ORAL
  Filled 2017-01-03 (×5): qty 1

## 2017-01-03 MED ORDER — INSULIN ASPART 100 UNIT/ML ~~LOC~~ SOLN
0.0000 [IU] | SUBCUTANEOUS | Status: DC
  Administered 2017-01-03: 3 [IU] via SUBCUTANEOUS
  Administered 2017-01-03: 5 [IU] via SUBCUTANEOUS
  Administered 2017-01-03: 11 [IU] via SUBCUTANEOUS
  Administered 2017-01-04 (×2): 3 [IU] via SUBCUTANEOUS
  Administered 2017-01-05 (×3): 2 [IU] via SUBCUTANEOUS
  Administered 2017-01-05: 3 [IU] via SUBCUTANEOUS
  Administered 2017-01-05: 2 [IU] via SUBCUTANEOUS
  Administered 2017-01-06 (×3): 3 [IU] via SUBCUTANEOUS
  Administered 2017-01-06 (×2): 2 [IU] via SUBCUTANEOUS
  Administered 2017-01-06 – 2017-01-07 (×4): 3 [IU] via SUBCUTANEOUS
  Administered 2017-01-07: 2 [IU] via SUBCUTANEOUS
  Administered 2017-01-07 (×2): 3 [IU] via SUBCUTANEOUS
  Administered 2017-01-08: 2 [IU] via SUBCUTANEOUS
  Administered 2017-01-08 (×2): 5 [IU] via SUBCUTANEOUS
  Administered 2017-01-08: 3 [IU] via SUBCUTANEOUS
  Administered 2017-01-08: 8 [IU] via SUBCUTANEOUS
  Administered 2017-01-09: 5 [IU] via SUBCUTANEOUS
  Administered 2017-01-09 (×2): 3 [IU] via SUBCUTANEOUS
  Administered 2017-01-09: 2 [IU] via SUBCUTANEOUS
  Administered 2017-01-09 (×2): 3 [IU] via SUBCUTANEOUS
  Administered 2017-01-09: 2 [IU] via SUBCUTANEOUS
  Administered 2017-01-10: 3 [IU] via SUBCUTANEOUS

## 2017-01-03 MED ORDER — ORAL CARE MOUTH RINSE
15.0000 mL | Freq: Two times a day (BID) | OROMUCOSAL | Status: DC
  Administered 2017-01-03 (×2): 15 mL via OROMUCOSAL

## 2017-01-03 MED ORDER — CHLORHEXIDINE GLUCONATE 0.12 % MT SOLN
15.0000 mL | Freq: Two times a day (BID) | OROMUCOSAL | Status: DC
  Administered 2017-01-03 – 2017-01-06 (×6): 15 mL via OROMUCOSAL
  Filled 2017-01-03 (×5): qty 15

## 2017-01-03 MED ORDER — DEXTROSE 5 % IV SOLN
1.0000 g | Freq: Three times a day (TID) | INTRAVENOUS | Status: DC
  Administered 2017-01-03 – 2017-01-04 (×5): 1 g via INTRAVENOUS
  Filled 2017-01-03 (×6): qty 1

## 2017-01-03 MED ORDER — PANTOPRAZOLE SODIUM 40 MG PO TBEC
40.0000 mg | DELAYED_RELEASE_TABLET | Freq: Every day | ORAL | Status: DC
  Administered 2017-01-03 – 2017-01-05 (×2): 40 mg via ORAL
  Filled 2017-01-03 (×3): qty 1

## 2017-01-03 MED ORDER — METHYLPREDNISOLONE SODIUM SUCC 40 MG IJ SOLR
40.0000 mg | Freq: Four times a day (QID) | INTRAMUSCULAR | Status: AC
  Administered 2017-01-03 (×3): 40 mg via INTRAVENOUS
  Filled 2017-01-03 (×3): qty 1

## 2017-01-03 MED ORDER — SODIUM CHLORIDE 0.9 % IV BOLUS (SEPSIS)
1000.0000 mL | Freq: Once | INTRAVENOUS | Status: AC
Start: 2017-01-03 — End: 2017-01-03
  Administered 2017-01-03: 1000 mL via INTRAVENOUS

## 2017-01-03 MED ORDER — FLUTICASONE PROPIONATE 50 MCG/ACT NA SUSP
2.0000 | Freq: Every day | NASAL | Status: DC
  Filled 2017-01-03: qty 16

## 2017-01-03 MED ORDER — SODIUM CHLORIDE 0.9% FLUSH
3.0000 mL | Freq: Two times a day (BID) | INTRAVENOUS | Status: DC
  Administered 2017-01-03 – 2017-01-12 (×12): 3 mL via INTRAVENOUS

## 2017-01-03 MED ORDER — VANCOMYCIN HCL 500 MG IV SOLR
500.0000 mg | Freq: Two times a day (BID) | INTRAVENOUS | Status: DC
  Administered 2017-01-03 – 2017-01-04 (×3): 500 mg via INTRAVENOUS
  Filled 2017-01-03 (×4): qty 500

## 2017-01-03 MED ORDER — ACETAMINOPHEN 650 MG RE SUPP: 650.0000 mg | Freq: Four times a day (QID) | RECTAL | Status: DC | PRN

## 2017-01-03 MED ORDER — ACETAMINOPHEN 325 MG PO TABS
650.0000 mg | ORAL_TABLET | Freq: Four times a day (QID) | ORAL | Status: DC | PRN
  Administered 2017-01-05 – 2017-01-16 (×16): 650 mg via ORAL
  Filled 2017-01-03 (×16): qty 2

## 2017-01-03 MED ORDER — DEXTROSE 5 % IV SOLN
2.0000 g | Freq: Once | INTRAVENOUS | Status: AC
  Administered 2017-01-03: 2 g via INTRAVENOUS
  Filled 2017-01-03: qty 2

## 2017-01-03 MED ORDER — SODIUM CHLORIDE 0.9 % IV SOLN
INTRAVENOUS | Status: DC
  Administered 2017-01-03: 75 mL/h via INTRAVENOUS

## 2017-01-03 MED ORDER — MAGNESIUM SULFATE 2 GM/50ML IV SOLN
2.0000 g | Freq: Once | INTRAVENOUS | Status: AC
  Administered 2017-01-03: 2 g via INTRAVENOUS
  Filled 2017-01-03: qty 50

## 2017-01-03 MED ORDER — BISACODYL 10 MG RE SUPP
10.0000 mg | Freq: Every day | RECTAL | Status: DC | PRN
  Filled 2017-01-03: qty 1

## 2017-01-03 MED ORDER — VANCOMYCIN HCL IN DEXTROSE 1-5 GM/200ML-% IV SOLN
1000.0000 mg | Freq: Once | INTRAVENOUS | Status: AC
  Administered 2017-01-03: 1000 mg via INTRAVENOUS
  Filled 2017-01-03: qty 200

## 2017-01-03 MED ORDER — POLYETHYLENE GLYCOL 3350 17 G PO PACK
17.0000 g | PACK | Freq: Two times a day (BID) | ORAL | Status: DC
  Administered 2017-01-03 – 2017-01-05 (×3): 17 g via ORAL
  Filled 2017-01-03 (×3): qty 1

## 2017-01-03 MED ORDER — PREDNISONE 20 MG PO TABS
40.0000 mg | ORAL_TABLET | Freq: Every day | ORAL | Status: DC
  Filled 2017-01-03 (×2): qty 2

## 2017-01-03 MED ORDER — QUETIAPINE FUMARATE 100 MG PO TABS
300.0000 mg | ORAL_TABLET | Freq: Every day | ORAL | Status: DC
  Administered 2017-01-03 – 2017-01-05 (×3): 300 mg via ORAL
  Filled 2017-01-03 (×3): qty 3

## 2017-01-03 MED ORDER — HYDROCODONE-ACETAMINOPHEN 5-325 MG PO TABS
1.0000 | ORAL_TABLET | Freq: Four times a day (QID) | ORAL | Status: DC | PRN
  Administered 2017-01-03 – 2017-01-04 (×3): 1 via ORAL
  Filled 2017-01-03 (×3): qty 1

## 2017-01-03 MED ORDER — ENOXAPARIN SODIUM 40 MG/0.4ML ~~LOC~~ SOLN
40.0000 mg | SUBCUTANEOUS | Status: DC
  Administered 2017-01-03 – 2017-01-08 (×5): 40 mg via SUBCUTANEOUS
  Filled 2017-01-03 (×5): qty 0.4

## 2017-01-03 MED ORDER — IPRATROPIUM-ALBUTEROL 0.5-2.5 (3) MG/3ML IN SOLN
3.0000 mL | Freq: Four times a day (QID) | RESPIRATORY_TRACT | Status: DC
  Administered 2017-01-03 – 2017-01-09 (×22): 3 mL via RESPIRATORY_TRACT
  Filled 2017-01-03 (×25): qty 3

## 2017-01-03 MED ORDER — GABAPENTIN 100 MG PO CAPS
200.0000 mg | ORAL_CAPSULE | Freq: Four times a day (QID) | ORAL | Status: DC
  Administered 2017-01-03 (×4): 200 mg via ORAL
  Filled 2017-01-03 (×4): qty 2

## 2017-01-03 MED ORDER — QUETIAPINE FUMARATE 50 MG PO TABS
50.0000 mg | ORAL_TABLET | Freq: Every day | ORAL | Status: DC
  Administered 2017-01-03 – 2017-01-05 (×2): 50 mg via ORAL
  Filled 2017-01-03 (×3): qty 1

## 2017-01-03 MED ORDER — THEOPHYLLINE ER 300 MG PO TB12
300.0000 mg | ORAL_TABLET | Freq: Two times a day (BID) | ORAL | Status: DC
  Administered 2017-01-03 (×2): 300 mg via ORAL
  Filled 2017-01-03 (×3): qty 1

## 2017-01-03 MED ORDER — FLEET ENEMA 7-19 GM/118ML RE ENEM
1.0000 | ENEMA | Freq: Once | RECTAL | Status: AC | PRN
  Administered 2017-01-09: 1 via RECTAL
  Filled 2017-01-03: qty 1

## 2017-01-03 MED ORDER — VENLAFAXINE HCL ER 150 MG PO CP24
150.0000 mg | ORAL_CAPSULE | Freq: Every day | ORAL | Status: DC
  Administered 2017-01-03 – 2017-01-07 (×3): 150 mg via ORAL
  Filled 2017-01-03 (×5): qty 1

## 2017-01-03 MED ORDER — OXYBUTYNIN CHLORIDE ER 5 MG PO TB24
5.0000 mg | ORAL_TABLET | Freq: Every day | ORAL | Status: DC
  Administered 2017-01-03 – 2017-01-17 (×13): 5 mg via ORAL
  Filled 2017-01-03 (×14): qty 1

## 2017-01-03 MED ORDER — ALBUTEROL SULFATE (2.5 MG/3ML) 0.083% IN NEBU: 2.5000 mg | INHALATION_SOLUTION | RESPIRATORY_TRACT | Status: DC | PRN

## 2017-01-03 NOTE — H&P (Addendum)
History and Physical  Traci Thomas ZOX:[id number] DOB: [birth year] DOA: 01/03/2017  PCP: Garwin Brothers, MD  Patient coming from: Skilled nursing facility  Chief Complaint: Shortness of breath  HPI:  66 year old woman PMH COPD, chronic respiratory failure on home oxygen, diabetes, dementia presented to the emergency department with shortness of breath, hypoxia. Placed on BiPAP, treated with antibiotics and referred for admission for respiratory acidosis, pneumonia, COPD exacerbation, acute respiratory failure.  Patient currently on BiPAP but is awake, alert and able to answer simple questions. History is vague. She has been increasingly short of breath over the last several days, shortness of breath is severe. No specific aggravating or alleviating factors. She has been coughing. Unclear whether off this productive or not. She's had a mild amount of abdominal pain and constipation. Left leg swelling for 2 weeks.  ED Course: Temperature 100.1, tachypnea, tachycardic, normotensive, hypoxic. Given antibiotics, started on BiPAP  Review of Systems:  Negative for changes to vision, sore throat, rash, diarrhea, vomiting, dysuria, bleeding.  Positive for fever, left leg pain, nausea, constipation, mild abdominal pain, nonspecific chest pain  Past Medical History:  Diagnosis Date  . Asthma   . Back pain   . COPD (chronic obstructive pulmonary disease) (HCC)    Home O2 3lpm, theophylline  . Diabetes mellitus without complication [facility])     Past Surgical History:  Procedure Laterality Date  . APPENDECTOMY    . FEMUR IM NAIL Right 10/21/2015   Procedure: INTRAMEDULLARY (IM) NAIL FEMORAL;  Surgeon: Meredith Pel, MD;  Location: [facility] ORS;  Service: Orthopedics;  Laterality: Right;  . TONSILLECTOMY       reports that she quit smoking about 2 years ago. Her smoking use included Cigarettes. She smoked 1.00 pack per day. She has never used smokeless tobacco. She reports that she drinks alcohol. She  reports that she does not use drugs. Mobility: Uses a wheelchair   Allergies  Allergen Reactions  . Adhesive [Tape] Other (See Comments)    Skin peels off   . Ciprofloxacin     Unknown reaction   . Latex     Unknown reaction   . Prednisone Nausea And Vomiting    Family History  Problem Relation Age of Onset  . COPD Mother   . Diabetes Unknown      Prior to Admission medications   Medication Sig Start Date End Date Taking? Authorizing Provider  acetaminophen (TYLENOL) 325 MG tablet Take 650 mg by mouth every 4 (four) hours as needed for fever.   Yes [provider]  albuterol (PROVENTIL HFA;VENTOLIN HFA) 108 (90 BASE) MCG/ACT inhaler Inhale 2 puffs into the lungs every 6 (six) hours as needed for wheezing or shortness of breath.   Yes [provider]  albuterol (PROVENTIL) (2.5 MG/3ML) 0.083% nebulizer solution Take 3 mLs (2.5 mg total) by nebulization every 4 (four) hours as needed for wheezing or shortness of breath. 10/17/16  Yes Arrien, Jimmy Picket, MD  ALPRAZolam Duanne Moron) 0.5 MG tablet Take 1 tablet (0.5 mg total) by mouth 3 (three) times daily as needed for anxiety. Patient taking differently: Take 0.5 mg by mouth every 6 (six) hours.  10/17/16  Yes Arrien, Jimmy Picket, MD  azithromycin (ZITHROMAX) 500 MG tablet Take 500 mg by mouth daily.   Yes [provider]  CEFTAZIDIME IV Inject 1 application into the vein every 8 (eight) hours.   Yes [provider]  desvenlafaxine (PRISTIQ) 100 MG 24 hr tablet Take 100 mg by mouth daily with  breakfast.    Yes [provider]  fluticasone (FLONASE) 50 MCG/ACT nasal spray Place 2 sprays into both nostrils daily.   Yes [provider]  fluticasone furoate-vilanterol (BREO ELLIPTA) 200-25 MCG/INH AEPB Inhale 1 puff into the lungs daily after breakfast.    Yes [provider]  furosemide (LASIX) 20 MG tablet Take 20 mg by mouth daily.   Yes [provider]    gabapentin (NEURONTIN) 100 MG capsule Take 200 mg by mouth 4 (four) times daily.    Yes [provider]  guaiFENesin (ROBITUSSIN) 100 MG/5ML liquid Take 200 mg by mouth every 4 (four) hours as needed for cough.   Yes [provider]  HYDROcodone-acetaminophen (NORCO/VICODIN) 5-325 MG tablet Take 1 tablet by mouth every 6 (six) hours as needed for moderate pain. Patient taking differently: Take 1 tablet by mouth every 6 (six) hours.  10/17/16  Yes Arrien, Jimmy Picket, MD  hydrocortisone 2.5 % cream Apply 1 application topically 3 (three) times daily. Apply to face   Yes [provider]  hydroxypropyl methylcellulose / hypromellose (ISOPTO TEARS / GONIOVISC) 2.5 % ophthalmic solution Place 1 drop into both eyes every 12 (twelve) hours as needed for dry eyes.   Yes [provider]  insulin aspart (NOVOLOG) 100 UNIT/ML injection Inject 0-15 Units into the skin 3 (three) times daily with meals. Glucose 150 to 200 use 2 units, for 201 to 250 use 4 units, for 251 to 300 use 6 units, for 301 to 350 use 8 units, for 351 or greater use 10 units. Patient taking differently: Inject 0-10 Units into the skin 3 (three) times daily with meals. Glucose 151 to 200 use 2 units, for 201 to 250 use 4 units, for 251 to 300 use 6 units, for 301 to 350 use 8 units, for 351 or greater use 10 units. Call MD if over 400 10/17/16  Yes Arrien, Jimmy Picket, MD  ipratropium-albuterol (DUONEB) 0.5-2.5 (3) MG/3ML SOLN Take 3 mLs by nebulization 4 (four) times daily.   Yes [provider]  levocetirizine (XYZAL) 5 MG tablet Take 5 mg by mouth every evening.   Yes [provider]  magnesium hydroxide (MILK OF MAGNESIA) 400 MG/5ML suspension Take 30 mLs by mouth as needed for mild constipation.   Yes [provider]  Menthol, Topical Analgesic, (ICY HOT EX) Apply 1 patch topically daily. Leave on for 8 hours   Yes [provider]  omeprazole (PRILOSEC) 20 MG  capsule Take 20 mg by mouth daily before breakfast.   Yes [provider]  oxybutynin (DITROPAN-XL) 5 MG 24 hr tablet Take 5 mg by mouth daily with breakfast.   Yes [provider]  OXYGEN Inhale 6 L/min into the lungs continuous.   Yes [provider]  predniSONE (DELTASONE) 20 MG tablet Take 2 tablets (40 mg total) by mouth daily with breakfast. Take 2 tablets daily for 2 days, then 1 tablet daily for 2 days, then half tablet daily for 2 days. Patient taking differently: Take 40 mg by mouth daily with breakfast.  10/18/16  Yes Arrien, Jimmy Picket, MD  prochlorperazine (COMPAZINE) 10 MG tablet Take 10 mg by mouth every 4 (four) hours as needed for nausea or vomiting.   Yes [provider]  QUEtiapine (SEROQUEL) 300 MG tablet Take 300 mg by mouth at bedtime.   Yes [provider]  QUEtiapine (SEROQUEL) 50 MG tablet Take 50 mg by mouth daily with breakfast.   Yes [provider]  senna-docusate (SENOKOT-S) 8.6-50 MG tablet Take 2 tablets by mouth 2 (two) times daily. 10/24/15  Yes Florencia Reasons, MD  theophylline (THEODUR) 300 MG 12 hr tablet Take 300 mg by mouth 2 (two) times daily.   Yes [provider]  tiotropium (SPIRIVA HANDIHALER) 18 MCG inhalation capsule Place 1 capsule (18 mcg total) into inhaler and inhale daily. 03/31/15  Yes Short, Noah Delaine, MD  Vitamin D, Ergocalciferol, (DRISDOL) 50000 units CAPS capsule Take 50,000 Units by mouth every Sunday.    Yes [provider]  feeding supplement, ENSURE ENLIVE, (ENSURE ENLIVE) LIQD Take 237 mLs by mouth 2 (two) times daily between meals. Patient not taking: Reported on 11/30/2016 10/17/16   Arrien, Jimmy Picket, MD    Physical Exam:  100.1, 24, 106, 155/88, 95% on BiPAP.  Constitutional. Appears nontoxic. Anxious, mildly uncomfortable.  Psychiatric. Alert, speaks in full sentences. Follows simple commands.  Eyes: Pupils, irises, lids appear grossly normal.  ENT.  Grossly normal hearing, lips. BiPAP mask on face. External ears and nose appear unremarkable.  Neck. No lymphadenopathy or masses. No thyromegaly. Limited exam secondary to short neck, BiPAP.  Cardiovascular. Tachycardic, regular rhythm. No murmur, rub or gallop. 2+ left lower extremity edema. No significant right lower extremity edema.  Respiratory. Poor breath sounds, poor air movement, no frank wheezes, rales or rhonchi. Moderate increased respiratory effort but able to speak in short sentences on BiPAP.  Abdomen. Soft, distended, mild generalized tenderness. No hepatomegaly noted. No hernias noted.  Skin. No rash or induration. Nontender to palpation.  Musculoskeletal. Grossly normal tone and strength all extremities.   Wt Readings from Last 3 Encounters:  01/03/17 66 kg (145 lb 8.1 oz)  11/30/16 60.8 kg (133 lb 15.7 oz)  11/02/16 59.3 kg (130 lb 12.8 oz)    I have personally reviewed following labs and imaging studies  Labs:   ABG 7.29/78/62  Complete metabolic panel unremarkable  Troponin and lactic acid within normal limits  Hemoglobin 9.9  Imaging studies:   Chest x-ray independently reviewed, bilateral infiltrates, infiltrate most prominent on the lateral view.  Medical tests:   EKG independently reviewed sinus tachycardia, borderline rightward axis, no acute changes.  Test discussed with performing physician:    Decision to obtain old records:     Review and summation of old records:   Echocardiogram 09/2016: LVEF 50-55%, normal wall motion, no regional wall motion abnormalities   Hospitalization 09/2016: Acute on chronic hypoxic, hypercapnic respiratory failure, pneumonia, acute on chronic diastolic heart failure, COPD exacerbation. Treated with BiPAP.   Principal Problem:   Acute respiratory failure with hypoxia and hypercapnia (HCC) Active Problems:   COPD exacerbation (HCC)   Diabetes mellitus type 2 in nonobese (HCC)   Stage 4 very severe COPD  by GOLD classification ([place])   Lobar pneumonia (HCC)   Chronic respiratory failure with hypoxia (HCC)   Acute respiratory acidosis   Normocytic anemia   Assessment/Plan Acute hypoxemic and hypercapnic respiratory failure with acute respiratory acidosis, superimposed on chronic hypoxic respiratory failure, secondary to lobar pneumonia, COPD exacerbation. Currently awake, alert on BiPAP, no indication for intubation at this point. Does not meet sepsis criteria at this point (no evidence of end organ dysfunction). -Continue BiPAP. Awake, alert, nontoxic. WIll follow clinically. Check ABG if worsens or develops encephalopathy. -Continue steroids, antibiotics, bronchodilators and follow-up culture data. Wean BiPAP as tolerated.  Lobar pneumonia -Plan as above  COPD exacerbation, COPD GOLD IV, chronic hypoxic respiratory failure -Plan as above  Left lower extremity  edema. Dorsalis pedis pulse 2+. -Rule out DVT. Check left lower extremity venous Doppler.  Constipation. -Bowel regimen  Diabetes mellitus with peripheral neuropathy -Random blood sugar 237, anion gap within normal limits. -SSI -Continue gabapentin   Normocytic anemia at baseline  Depression, anxiety. -PRN lorazepam, continue Seroquel  Severity of Illness: The appropriate patient status for this patient is INPATIENT. Inpatient status is judged to be reasonable and necessary in order to provide the required intensity of service to ensure the patient's safety. The patient's presenting symptoms, physical exam findings, and initial radiographic and laboratory data in the context of their chronic comorbidities is felt to place them at high risk for further clinical deterioration. Furthermore, it is not anticipated that the patient will be medically stable for discharge from the hospital within 2 midnights of admission. The following factors support the patient status of inpatient.   " The patient's presenting symptoms include  shortness of breath. " The worrisome physical exam findings include tachypnea, abdominal distention, left lower extremity edema. " The initial radiographic and laboratory data are worrisome because of lobar pneumonia, respiratory acidosis, hypercapnia. " The chronic co-morbidities include advanced COPD, diabetes mellitus, anxiety.   * I certify that at the point of admission it is my clinical judgment that the patient will require inpatient hospital care spanning beyond 2 midnights from the point of admission due to high intensity of service, high risk for further deterioration and high frequency of surveillance required.*     DVT prophylaxis: enoxaparin Code Status: full Family Communication: son at bedside Consults called: none    Time spent: 22 minutes  Murray Hodgkins, MD  [facility] Direct contact: [phone] --Via [place]  --[email or web address]; password [email or web address]  7PM-7AM contact night coverage as above  01/03/2017, 9:04 AM

## 2017-01-03 NOTE — ED Notes (Signed)
Bed: NV91 Expected date:  Expected time:  Means of arrival:  Comments: EMS 66 yo from rehab facility-SOB with recent pneumonia-fever/tachycardia

## 2017-01-03 NOTE — Progress Notes (Signed)
Pt has bilateral hearing aids in small black case. Hearing aids were taken home with son [name]

## 2017-01-03 NOTE — Progress Notes (Signed)
Pharmacy Antibiotic Note  Traci Thomas is a 66 y.o. female admitted on 01/03/2017 with pneumonia.  Pt has known hx of COPD, came from facility with fever and respiratory distress.  Pharmacy has been consulted for cefepime and vancomycin dosing.  Plan: Vancomycin 1 gm IV x 1 in ED then 500mg  IV q12h Cefepime 2gm in Ed then 1 gm IV q8h Follow renal function, cultures and clinical course  Height: 4\' 11"  (149.9 cm) Weight: 139 lb (63 kg) IBW/kg (Calculated) : 43.2  Temp (24hrs), Avg:100.1 F (37.8 C), Min:100.1 F (37.8 C), Max:100.1 F (37.8 C)   Recent Labs Lab 01/03/17 0441 01/03/17 0450  WBC 10.6*  --   LATICACIDVEN  --  0.78    CrCl cannot be calculated (Patient's most recent lab result is older than the maximum 21 days allowed.).    Allergies  Allergen Reactions  . Adhesive [Tape] Other (See Comments)    Skin peels off   . Ciprofloxacin     Unknown reaction   . Latex     Unknown reaction   . Prednisone Nausea And Vomiting    Antimicrobials this admission: 7/17 vanc >> 7/17 cefepime >>   Microbiology results: 7/17 BCx:    Thank you for allowing pharmacy to be a part of this patient's care.  Dolly Rias RPh 01/03/2017, 6:01 AM Pager [phone]

## 2017-01-03 NOTE — ED Notes (Signed)
Pt presents to ED via [facility] for respiratory distress with spO2-83% on 3L [place] and fever, temp. At the facility 136f. Pt was recently admitted for pneumonia and discharged to [facility] for rehab and IV antibiotics. Pt given 125mg  solumedrol at the facility prior to arrival to ED.

## 2017-01-03 NOTE — Care Management Note (Signed)
Case Management Note  Patient Details  Name: Traci Thomas MRN: [id number] Date of Birth: 1951/02/10  Subjective/Objective:      pna in patient with known copd              Action/Plan: Date:  January 03, 2017 Chart reviewed for concurrent status and case management needs. Will continue to follow patient progress. Discharge Planning: following for needs Expected discharge date: [id number] Velva Harman, BSN, [place], [place]  Expected Discharge Date:                  Expected Discharge Plan:  Home/Self Care  In-House Referral:     Discharge planning Services  CM Consult  Post Acute Care Choice:    Choice offered to:     DME Arranged:    DME Agency:     HH Arranged:    HH Agency:     Status of Service:  In process, will continue to follow  If discussed at Long Length of Stay Meetings, dates discussed:    Additional Comments:  Leeroy Cha, RN 01/03/2017, 8:20 AM

## 2017-01-03 NOTE — ED Notes (Signed)
Son at bedside.

## 2017-01-03 NOTE — ED Provider Notes (Signed)
[facility] DEPT Provider Note   CSN: [id number] Arrival date & time: 01/03/17  0421     History   Chief Complaint Chief Complaint  Patient presents with  . Respiratory Distress  . Fever    HPI Traci Thomas is a 66 y.o. female.  HPI   Patient is a 66 year old female with history of severe COPD, diabetes and chronic respiratory failure on home oxygen who presents to the ED via EMS from rehabilitation facility with complaint of shortness of breath. Patient reports over the past 5 days she has had gradually worsening shortness of breath with associated productive cough, fever, chills and generalized body aches. Patient reports finishing antibiotics a few weeks ago for pneumonia which she was hospitalized for in April. Denies CP, hemoptysis, abdominal pain, N/V, diarrhea, urinary sxs, leg swelling, rash. EMS reported pt with hypoxia on 5L and placed pt on NRB mask with improvement of O2. Rehab facility administered 125mg  Solumedrol PTA.  Past Medical History:  Diagnosis Date  . Asthma   . Back pain   . COPD (chronic obstructive pulmonary disease) (HCC)    Home O2 3lpm, theophylline  . Diabetes mellitus without complication [facility])     Patient Active Problem List   Diagnosis Date Noted  . Chronic respiratory failure with hypoxia ([place]) 11/30/2016  . Constipation 11/02/2016  . Lobar pneumonia ([place]) 10/04/2016  . Acute metabolic encephalopathy 11/94/1740  . Abdominal pain   . Sepsis due to pneumonia ([place])   . Acute respiratory failure ([place]) 09/28/2016  . CAP (community acquired pneumonia) 10/21/2015  . Hip fracture ([place]) 10/21/2015  . Acute respiratory failure with hypoxia ([place]) 10/21/2015  . Stage 4 very severe COPD by GOLD classification ([place]) 10/21/2015  . Chronic anemia 10/21/2015  . Dementia 10/21/2015  . Preoperative respiratory examination 10/21/2015    Class: Acute  . Closed right hip fracture ([place])   . Encounter for palliative care   . COPD exacerbation ([place])  03/25/2015  . Acute on chronic respiratory failure with hypoxia ([place]) 03/25/2015  . Somnolence 03/25/2015  . Acute delirium 03/25/2015  . Diabetes mellitus type 2 in nonobese ([place]) 03/25/2015  . Depression 03/25/2015    Past Surgical History:  Procedure Laterality Date  . APPENDECTOMY    . FEMUR IM NAIL Right 10/21/2015   Procedure: INTRAMEDULLARY (IM) NAIL FEMORAL;  Surgeon: Meredith Pel, MD;  Location: [facility] ORS;  Service: Orthopedics;  Laterality: Right;  . TONSILLECTOMY      OB History    No data available       Home Medications    Prior to Admission medications   Medication Sig Start Date End Date Taking? Authorizing Provider  albuterol (PROVENTIL HFA;VENTOLIN HFA) 108 (90 BASE) MCG/ACT inhaler Inhale 2 puffs into the lungs every 6 (six) hours as needed for wheezing or shortness of breath.    [provider]  albuterol (PROVENTIL) (2.5 MG/3ML) 0.083% nebulizer solution Take 3 mLs (2.5 mg total) by nebulization every 4 (four) hours as needed for wheezing or shortness of breath. 10/17/16   Arrien, Jimmy Picket, MD  ALPRAZolam Duanne Moron) 0.5 MG tablet Take 1 tablet (0.5 mg total) by mouth 3 (three) times daily as needed for anxiety. 10/17/16   Arrien, Jimmy Picket, MD  cetirizine (ZYRTEC) 10 MG tablet Take 10 mg by mouth daily with breakfast.    [provider]  desvenlafaxine (PRISTIQ) 100 MG 24 hr tablet Take 100 mg by mouth daily with breakfast.     [provider]  feeding  supplement, ENSURE ENLIVE, (ENSURE ENLIVE) LIQD Take 237 mLs by mouth 2 (two) times daily between meals. Patient not taking: Reported on 11/30/2016 10/17/16   Arrien, Jimmy Picket, MD  fluticasone [facility]) 50 MCG/ACT nasal spray Place 2 sprays into both nostrils daily.    [provider]  fluticasone furoate-vilanterol (BREO ELLIPTA) 200-25 MCG/INH AEPB Inhale 1 puff into the lungs every evening.    [provider]  gabapentin (NEURONTIN) 100 MG capsule Take  200 mg by mouth every 6 (six) hours.    [provider]  guaiFENesin (ROBITUSSIN) 100 MG/5ML liquid Take 200 mg by mouth every 4 (four) hours as needed for cough.    [provider]  HYDROcodone-acetaminophen (NORCO/VICODIN) 5-325 MG tablet Take 1 tablet by mouth every 6 (six) hours as needed for moderate pain. 10/17/16   Arrien, Jimmy Picket, MD  hydroxypropyl methylcellulose / hypromellose (ISOPTO TEARS / GONIOVISC) 2.5 % ophthalmic solution Place 1 drop into both eyes every 12 (twelve) hours as needed for dry eyes.    [provider]  insulin aspart (NOVOLOG) 100 UNIT/ML injection Inject 0-15 Units into the skin 3 (three) times daily with meals. Glucose 150 to 200 use 2 units, for 201 to 250 use 4 units, for 251 to 300 use 6 units, for 301 to 350 use 8 units, for 351 or greater use 10 units. 10/17/16   Arrien, Jimmy Picket, MD  magnesium hydroxide (MILK OF MAGNESIA) 400 MG/5ML suspension Take 30 mLs by mouth as needed for mild constipation.    [provider]  omeprazole (PRILOSEC) 20 MG capsule Take 20 mg by mouth daily before breakfast.    [provider]  oxybutynin (DITROPAN-XL) 5 MG 24 hr tablet Take 5 mg by mouth daily with breakfast.    [provider]  OXYGEN Inhale 6 L/min into the lungs continuous.    [provider]  predniSONE (DELTASONE) 20 MG tablet Take 2 tablets (40 mg total) by mouth daily with breakfast. Take 2 tablets daily for 2 days, then 1 tablet daily for 2 days, then half tablet daily for 2 days. Patient not taking: Reported on 11/30/2016 10/18/16   Arrien, Jimmy Picket, MD  prochlorperazine (COMPAZINE) 10 MG tablet Take 10 mg by mouth every 4 (four) hours as needed for nausea or vomiting.    [provider]  QUEtiapine (SEROQUEL) 300 MG tablet Take 300 mg by mouth at bedtime.    [provider]  QUEtiapine (SEROQUEL) 50 MG tablet Take 50 mg by mouth daily with breakfast.    [provider]  senna-docusate (SENOKOT-S) 8.6-50 MG tablet Take 2 tablets by mouth 2 (two) times daily. Patient not taking: Reported on 11/30/2016 10/24/15   Florencia Reasons, MD  theophylline (THEODUR) 300 MG 12 hr tablet Take 300 mg by mouth 2 (two) times daily.    [provider]  tiotropium (SPIRIVA HANDIHALER) 18 MCG inhalation capsule Place 1 capsule (18 mcg total) into inhaler and inhale daily. Patient not taking: Reported on 11/30/2016 03/31/15   Janece Canterbury, MD  trimethoprim-polymyxin b (POLYTRIM) ophthalmic solution Place 1 drop into both eyes 4 (four) times daily. Started 04/05 for 7 days    [provider]  Vitamin D, Ergocalciferol, (DRISDOL) 50000 units CAPS capsule Take 50,000 Units by mouth every Sunday.     [provider]    Family History Family History  Problem Relation Age of Onset  . COPD Mother   . Diabetes Unknown     Social History  Social History  Substance Use Topics  . Smoking status: Former Smoker    Packs/day: 1.00    Types: Cigarettes    Quit date: 2016  . Smokeless tobacco: Never Used     Comment: started smoking 2017 to current sometimes 1 daily or once a month  . Alcohol use 0.0 oz/week     Comment: occasional beer     Allergies   Adhesive [tape]; Ciprofloxacin; Latex; and Prednisone   Review of Systems Review of Systems  Constitutional: Positive for fever.  Respiratory: Positive for cough and shortness of breath.   Musculoskeletal: Positive for myalgias (generalized).  All other systems reviewed and are negative.    Physical Exam Updated Vital Signs BP 128/70   Pulse (!) 109   Temp 100.1 F (37.8 C) (Rectal)   Resp (!) 27   Ht 4\' 11"  (1.499 m)   Wt 63 kg (139 lb)   SpO2 91%   BMI 28.07 kg/m   Physical Exam  Constitutional: She is oriented to person, place, and time. She appears well-developed and well-nourished.  HENT:  Head: Normocephalic and atraumatic.  Mouth/Throat: Oropharynx is clear and moist.  No oropharyngeal exudate.  Eyes: Conjunctivae and EOM are normal. Right eye exhibits no discharge. Left eye exhibits no discharge. No scleral icterus.  Neck: Normal range of motion. Neck supple.  Cardiovascular: Regular rhythm, normal heart sounds and intact distal pulses.   Tachycardic, HR 116  Pulmonary/Chest: Effort normal. No respiratory distress. She has wheezes. She has no rales. She exhibits no tenderness.  Decreased breath sounds throughout with end-expiratory wheezing throughout  Abdominal: Soft. Bowel sounds are normal. She exhibits no distension and no mass. There is no tenderness. There is no rebound and no guarding.  Musculoskeletal: Normal range of motion. She exhibits no edema.  Neurological: She is alert and oriented to person, place, and time.  Skin: Skin is warm and dry. She is not diaphoretic.  Nursing note and vitals reviewed.    ED Treatments / Results  Labs (all labs ordered are listed, but only abnormal results are displayed) Labs Reviewed  CBC WITH DIFFERENTIAL/PLATELET - Abnormal; Notable for the following:       Result Value   WBC 10.6 (*)    RBC 3.56 (*)    Hemoglobin 9.9 (*)    HCT 32.4 (*)    RDW 16.1 (*)    Neutro Abs 8.8 (*)    All other components within normal limits  BLOOD GAS, ARTERIAL - Abnormal; Notable for the following:    pH, Arterial 7.295 (*)    pCO2 arterial 78.4 (*)    pO2, Arterial 62.7 (*)    Bicarbonate 36.5 (*)    Acid-Base Excess 8.6 (*)    All other components within normal limits  CULTURE, BLOOD (ROUTINE X 2)  CULTURE, BLOOD (ROUTINE X 2)  PROTIME-INR  COMPREHENSIVE METABOLIC PANEL  URINALYSIS, ROUTINE W REFLEX MICROSCOPIC  I-STAT CG4 LACTIC ACID, ED  I-STAT TROPOININ, ED    EKG  EKG Interpretation  Date/Time:  Tuesday January 03 2017 04:29:45 EDT Ventricular Rate:  115 PR Interval:    QRS Duration: 99 QT Interval:  328 QTC Calculation: 454 R Axis:   91 Text Interpretation:  Sinus tachycardia Right axis deviation  Borderline low voltage, extremity leads When compared with ECG of 10/16/2016, No significant change was found Confirmed by Delora Fuel ([id number]) on 01/03/2017 4:53:17 AM       Radiology Dg Chest 2 View  Result Date: 01/03/2017 CLINICAL DATA:  66 year old female with cough and shortness of breath. EXAM: CHEST  2 VIEW COMPARISON:  Chest radiograph dated 11/02/2016 FINDINGS: Bilateral lower lobe hazy and streaky densities noted which have increased compared to the prior radiograph and may represent atelectasis versus developing infiltrate. Small pleural effusions may be present. There is no pneumothorax. The cardiac silhouette is within normal limits. Right-sided PICC with tip over central SVC. No acute osseous pathology. IMPRESSION: Interval increase in bibasilar densities compared to the prior radiograph, atelectasis versus infiltrate. Probable small pleural effusions. Clinical correlation and follow-up recommended. Electronically Signed   By: Anner Crete M.D.   On: 01/03/2017 05:42    Procedures .Critical Care Performed by: Nona Dell Authorized by: Nona Dell   Critical care provider statement:    Critical care time (minutes):  40   Critical care was necessary to treat or prevent imminent or life-threatening deterioration of the following conditions:  Sepsis and respiratory failure   Critical care was time spent personally by me on the following activities:  Blood draw for specimens, development of treatment plan with patient or surrogate, evaluation of patient's response to treatment, examination of patient, obtaining history from patient or surrogate, ordering and performing treatments and interventions, ordering and review of laboratory studies, ordering and review of radiographic studies, pulse oximetry, re-evaluation of patient's condition and review of old charts    (including critical care time)  Medications Ordered in ED Medications  ceFEPIme  (MAXIPIME) 2 g in dextrose 5 % 50 mL IVPB (not administered)  vancomycin (VANCOCIN) IVPB 1000 mg/200 mL premix (not administered)  sodium chloride 0.9 % bolus 1,000 mL (0 mLs Intravenous Stopped 01/03/17 0604)     Initial Impression / Assessment and Plan / ED Course  I have reviewed the triage vital signs and the nursing notes.  Pertinent labs & imaging results that were available during my care of the patient were reviewed by me and considered in my medical decision making (see chart for details).    Patient presents with worsening shortness of breath over the past 5 days with associated fever, chills, generalized body aches and productive cough. Patient is currently staying at rehabilitation facility after her most recent hospitalization in April for pneumonia. History of COPD and chronic respiratory failure on home oxygen. Initial vitals revealed rectal temp 100.1, heart rate 116, RR 27, O2 sat 99% on NRB. Remaining vitals stable. Exam revealed decreased breath sounds throughout with expiratory wheezing throughout. Pt given solumedrol at rehab facility PTA. Discussed pt with Dr. Roxanne Mins who also evaluated pt in the ED. Chart review shows pt with hx of CO2 retention. Pt placed back on 5L [place], O2 remained 88-90%, will order ABG to determine if need to start BiPAP for CO2 retention.   EKg showed sinus tachycardia with no acute ischemic changes. Trop negative. ABG showed pH 7.3, CO2 78, O2 88; pt placed on BiPAP. CXR showed increased bibasilar densities concerning for infiltrate. Pt started on abx and given 1L IVF. Discussed results and plan for admission with pt and family (son) at bedside. Consulted hospitalist for admission.  Final Clinical Impressions(s) / ED Diagnoses   Final diagnoses:  COPD exacerbation ([place])  Sepsis, due to unspecified organism ([place])  HCAP (healthcare-associated pneumonia)    New Prescriptions New Prescriptions   No medications on file     Renold Don 70/01/74 [id number]    Delora Fuel, MD 67/59/16 [phone]

## 2017-01-03 NOTE — Progress Notes (Signed)
Initial Nutrition Assessment  DOCUMENTATION CODES:   Not applicable  INTERVENTION:  - Diet advancement as medically feasible.  - RD will monitor for needs at follow-up.   NUTRITION DIAGNOSIS:   Inadequate oral intake related to inability to eat as evidenced by NPO status.  GOAL:   Patient will meet greater than or equal to 90% of their needs  MONITOR:   Diet advancement, Weight trends, Labs  REASON FOR ASSESSMENT:   Consult COPD Protocol  ASSESSMENT:   66 year old woman PMH COPD, chronic respiratory failure on home oxygen, diabetes, dementia presented to the emergency department with shortness of breath, hypoxia. Placed on BiPAP, treated with antibiotics and referred for admission for respiratory acidosis, pneumonia, COPD exacerbation, acute respiratory failure.  Pt seen for consult. BMI indicates overweight status. Weight +5.2 kg/12 lbs since 11/30/16 so weight from that time (60.8 kg) used in estimating nutrition needs. Pt has been NPO since admission. No family/visitors present at this time. Pt seems to have some degree of confusion/hallucination as she asks RD x2 to hand her the water that is set on her bed (no cup of water or water bottle present in the room). Pt states that she has abdominal pressure, as if she needs to have a BM.   Physical assessment shows no muscle and no fat wasting; mild to moderate edema to BLE.   Medications reviewed; sliding scale Novolog, 2 g IV Mg sulfate x1 run today, 40 mg Solu-medrol every 6 hours x3 doses followed by 40 mg Deltasone/day x4 days, 40 mg oral Protonix/day, 1 packet Miralax BID, 1 tablet Senokot BID. Labs reviewed; Cl: 97 mmol/L.  IVF: NS @ 75 mL/hr.    Diet Order:  Diet NPO time specified  Skin:  Reviewed, no issues  Last BM:  PTA/unknown  Height:   Ht Readings from Last 1 Encounters:  01/03/17 4\' 11"  (1.499 m)    Weight:   Wt Readings from Last 1 Encounters:  01/03/17 145 lb 8.1 oz (66 kg)    Ideal Body  Weight:  42.91 kg  BMI:  Body mass index is 29.39 kg/m.  Estimated Nutritional Needs:   Kcal:  1520-1705 (25-28 kcal/kg)  Protein:  60-73 grams (1-1.2 grams/kg)  Fluid:  1.5 L/day  EDUCATION NEEDS:   No education needs identified at this time    Jarome Matin, MS, RD, LDN, CNSC Inpatient Clinical Dietitian Pager # [phone] After hours/weekend pager # [phone]

## 2017-01-04 ENCOUNTER — Inpatient Hospital Stay (HOSPITAL_COMMUNITY): Payer: Medicare Other

## 2017-01-04 DIAGNOSIS — J9602 Acute respiratory failure with hypercapnia: Secondary | ICD-10-CM

## 2017-01-04 DIAGNOSIS — J189 Pneumonia, unspecified organism: Secondary | ICD-10-CM

## 2017-01-04 DIAGNOSIS — M79609 Pain in unspecified limb: Secondary | ICD-10-CM

## 2017-01-04 DIAGNOSIS — J441 Chronic obstructive pulmonary disease with (acute) exacerbation: Secondary | ICD-10-CM

## 2017-01-04 DIAGNOSIS — J9601 Acute respiratory failure with hypoxia: Secondary | ICD-10-CM

## 2017-01-04 LAB — BASIC METABOLIC PANEL
ANION GAP: 8 (ref 5–15)
BUN: 19 mg/dL (ref 6–20)
CALCIUM: 8.7 mg/dL — AB (ref 8.9–10.3)
CO2: 33 mmol/L — ABNORMAL HIGH (ref 22–32)
CREATININE: 0.73 mg/dL (ref 0.44–1.00)
Chloride: 103 mmol/L (ref 101–111)
GLUCOSE: 119 mg/dL — AB (ref 65–99)
Potassium: 3.9 mmol/L (ref 3.5–5.1)
Sodium: 144 mmol/L (ref 135–145)

## 2017-01-04 LAB — BLOOD GAS, ARTERIAL
ACID-BASE EXCESS: 7.1 mmol/L — AB (ref 0.0–2.0)
Acid-Base Excess: 6.3 mmol/L — ABNORMAL HIGH (ref 0.0–2.0)
BICARBONATE: 32.7 mmol/L — AB (ref 20.0–28.0)
Bicarbonate: 33 mmol/L — ABNORMAL HIGH (ref 20.0–28.0)
DRAWN BY: 331471
FIO2: 40
FIO2: 70
MECHVT: 450 mL
O2 Saturation: 89.8 %
O2 Saturation: 93.6 %
PATIENT TEMPERATURE: 98.6
PEEP: 5 cmH2O
PO2 ART: 72.6 mmHg — AB (ref 83.0–108.0)
Patient temperature: 98.6
RATE: 14 resp/min
pCO2 arterial: 54.3 mmHg — ABNORMAL HIGH (ref 32.0–48.0)
pCO2 arterial: 63.8 mmHg — ABNORMAL HIGH (ref 32.0–48.0)
pH, Arterial: 7.397 (ref 7.350–7.450)
pH, Arterial: 7.333 — ABNORMAL LOW (ref 7.350–7.450)
pO2, Arterial: 57.9 mmHg — ABNORMAL LOW (ref 83.0–108.0)

## 2017-01-04 LAB — URINALYSIS, ROUTINE W REFLEX MICROSCOPIC
Bacteria, UA: NONE SEEN
Bilirubin Urine: NEGATIVE
GLUCOSE, UA: NEGATIVE mg/dL
KETONES UR: 5 mg/dL — AB
Leukocytes, UA: NEGATIVE
Nitrite: NEGATIVE
PH: 5 (ref 5.0–8.0)
PROTEIN: 30 mg/dL — AB
Specific Gravity, Urine: 1.01 (ref 1.005–1.030)

## 2017-01-04 LAB — CBC
HCT: 28.5 % — ABNORMAL LOW (ref 36.0–46.0)
HEMOGLOBIN: 8.8 g/dL — AB (ref 12.0–15.0)
MCH: 27.4 pg (ref 26.0–34.0)
MCHC: 30.9 g/dL (ref 30.0–36.0)
MCV: 88.8 fL (ref 78.0–100.0)
PLATELETS: 268 10*3/uL (ref 150–400)
RBC: 3.21 MIL/uL — AB (ref 3.87–5.11)
RDW: 15.7 % — ABNORMAL HIGH (ref 11.5–15.5)
WBC: 7.9 10*3/uL (ref 4.0–10.5)

## 2017-01-04 LAB — STREP PNEUMONIAE URINARY ANTIGEN: STREP PNEUMO URINARY ANTIGEN: NEGATIVE

## 2017-01-04 LAB — GLUCOSE, CAPILLARY
Glucose-Capillary: 118 mg/dL — ABNORMAL HIGH (ref 65–99)
Glucose-Capillary: 159 mg/dL — ABNORMAL HIGH (ref 65–99)
Glucose-Capillary: 128 mg/dL — ABNORMAL HIGH (ref 65–99)
Glucose-Capillary: 215 mg/dL — ABNORMAL HIGH (ref 65–99)
Glucose-Capillary: 161 mg/dL — ABNORMAL HIGH (ref 65–99)
Glucose-Capillary: 107 mg/dL — ABNORMAL HIGH (ref 65–99)

## 2017-01-04 MED ORDER — FENTANYL CITRATE (PF) 100 MCG/2ML IJ SOLN
INTRAMUSCULAR | Status: AC
  Administered 2017-01-04: 200 ug
  Filled 2017-01-04: qty 4

## 2017-01-04 MED ORDER — METHYLPREDNISOLONE SODIUM SUCC 40 MG IJ SOLR
40.0000 mg | Freq: Two times a day (BID) | INTRAMUSCULAR | Status: DC
  Administered 2017-01-04 – 2017-01-09 (×12): 40 mg via INTRAVENOUS
  Filled 2017-01-04 (×12): qty 1

## 2017-01-04 MED ORDER — MIDAZOLAM HCL 2 MG/2ML IJ SOLN
INTRAMUSCULAR | Status: AC
  Administered 2017-01-04: 6 mg
  Filled 2017-01-04: qty 4

## 2017-01-04 MED ORDER — LORAZEPAM 2 MG/ML IJ SOLN
0.5000 mg | Freq: Four times a day (QID) | INTRAMUSCULAR | Status: DC | PRN
  Administered 2017-01-04 – 2017-01-08 (×9): 1 mg via INTRAVENOUS
  Filled 2017-01-04 (×9): qty 1

## 2017-01-04 MED ORDER — FENTANYL CITRATE (PF) 100 MCG/2ML IJ SOLN
INTRAMUSCULAR | Status: AC
  Filled 2017-01-04: qty 4

## 2017-01-04 MED ORDER — SODIUM CHLORIDE 0.9 % IV BOLUS (SEPSIS)
1000.0000 mL | Freq: Once | INTRAVENOUS | Status: AC
  Administered 2017-01-04: 1000 mL via INTRAVENOUS

## 2017-01-04 MED ORDER — METOPROLOL TARTRATE 5 MG/5ML IV SOLN
5.0000 mg | Freq: Three times a day (TID) | INTRAVENOUS | Status: DC
  Administered 2017-01-04 – 2017-01-10 (×14): 5 mg via INTRAVENOUS
  Filled 2017-01-04 (×15): qty 5

## 2017-01-04 MED ORDER — METOPROLOL TARTRATE 5 MG/5ML IV SOLN: 5.0000 mg | Freq: Four times a day (QID) | INTRAVENOUS | Status: DC

## 2017-01-04 MED ORDER — HYDRALAZINE HCL 20 MG/ML IJ SOLN
20.0000 mg | INTRAMUSCULAR | Status: DC | PRN
  Administered 2017-01-04: 20 mg via INTRAVENOUS
  Filled 2017-01-04 (×2): qty 1

## 2017-01-04 MED ORDER — HALOPERIDOL LACTATE 5 MG/ML IJ SOLN
INTRAMUSCULAR | Status: AC
  Administered 2017-01-04: 2 mg via INTRAVENOUS
  Filled 2017-01-04: qty 1

## 2017-01-04 MED ORDER — LORAZEPAM 2 MG/ML IJ SOLN
0.5000 mg | Freq: Once | INTRAMUSCULAR | Status: AC
  Administered 2017-01-04: 0.5 mg via INTRAVENOUS
  Filled 2017-01-04: qty 1

## 2017-01-04 MED ORDER — ROCURONIUM BROMIDE 50 MG/5ML IV SOLN
50.0000 mg | Freq: Once | INTRAVENOUS | Status: AC
  Administered 2017-01-04: 50 mg via INTRAVENOUS

## 2017-01-04 MED ORDER — HYDRALAZINE HCL 20 MG/ML IJ SOLN
5.0000 mg | INTRAMUSCULAR | Status: DC | PRN
  Administered 2017-01-04: 5 mg via INTRAVENOUS
  Filled 2017-01-04: qty 1

## 2017-01-04 MED ORDER — FENTANYL CITRATE (PF) 2500 MCG/50ML IJ SOLN
25.0000 ug/h | INTRAMUSCULAR | Status: DC
  Administered 2017-01-04 (×2): 100 ug/h via INTRAVENOUS
  Administered 2017-01-05: 175 ug/h via INTRAVENOUS
  Administered 2017-01-06 – 2017-01-07 (×2): 125 ug/h via INTRAVENOUS
  Administered 2017-01-07 – 2017-01-08 (×2): 150 ug/h via INTRAVENOUS
  Filled 2017-01-04 (×7): qty 50

## 2017-01-04 MED ORDER — DEXMEDETOMIDINE HCL IN NACL 200 MCG/50ML IV SOLN
0.4000 ug/kg/h | INTRAVENOUS | Status: DC
  Administered 2017-01-04: 0.9 ug/kg/h via INTRAVENOUS
  Administered 2017-01-04: 0.4 ug/kg/h via INTRAVENOUS
  Filled 2017-01-04 (×2): qty 50

## 2017-01-04 MED ORDER — MIDAZOLAM HCL 2 MG/2ML IJ SOLN
INTRAMUSCULAR | Status: AC
  Filled 2017-01-04: qty 4

## 2017-01-04 MED ORDER — FENTANYL CITRATE (PF) 100 MCG/2ML IJ SOLN: 50.0000 ug | Freq: Once | INTRAMUSCULAR | Status: AC

## 2017-01-04 MED ORDER — FENTANYL BOLUS VIA INFUSION
50.0000 ug | INTRAVENOUS | Status: DC | PRN
  Administered 2017-01-04 – 2017-01-08 (×11): 50 ug via INTRAVENOUS
  Filled 2017-01-04: qty 50

## 2017-01-04 MED ORDER — ETOMIDATE 2 MG/ML IV SOLN
20.0000 mg | Freq: Once | INTRAVENOUS | Status: AC
  Administered 2017-01-04: 20 mg via INTRAVENOUS

## 2017-01-04 MED ORDER — IPRATROPIUM-ALBUTEROL 0.5-2.5 (3) MG/3ML IN SOLN
3.0000 mL | RESPIRATORY_TRACT | Status: DC | PRN
  Administered 2017-01-04 – 2017-01-15 (×4): 3 mL via RESPIRATORY_TRACT
  Filled 2017-01-04 (×2): qty 3

## 2017-01-04 MED ORDER — ORAL CARE MOUTH RINSE
15.0000 mL | Freq: Four times a day (QID) | OROMUCOSAL | Status: DC
  Administered 2017-01-05 – 2017-01-06 (×6): 15 mL via OROMUCOSAL

## 2017-01-04 MED ORDER — PHENYLEPHRINE HCL-NACL 10-0.9 MG/250ML-% IV SOLN
0.0000 ug/min | INTRAVENOUS | Status: DC
  Administered 2017-01-04: 20 ug/min via INTRAVENOUS
  Administered 2017-01-05: 35 ug/min via INTRAVENOUS
  Administered 2017-01-05: 20 ug/min via INTRAVENOUS
  Filled 2017-01-04 (×4): qty 250

## 2017-01-04 MED ORDER — FUROSEMIDE 10 MG/ML IJ SOLN
20.0000 mg | Freq: Every day | INTRAMUSCULAR | Status: DC
  Administered 2017-01-04: 20 mg via INTRAVENOUS
  Filled 2017-01-04: qty 2

## 2017-01-04 MED ORDER — MORPHINE SULFATE (PF) 2 MG/ML IV SOLN
1.0000 mg | INTRAVENOUS | Status: DC | PRN
  Filled 2017-01-04: qty 1

## 2017-01-04 MED ORDER — FUROSEMIDE 10 MG/ML IJ SOLN
20.0000 mg | Freq: Two times a day (BID) | INTRAMUSCULAR | Status: DC
  Administered 2017-01-04 – 2017-01-11 (×13): 20 mg via INTRAVENOUS
  Filled 2017-01-04 (×14): qty 2

## 2017-01-04 MED ORDER — CHLORHEXIDINE GLUCONATE 0.12% ORAL RINSE (MEDLINE KIT): 15.0000 mL | Freq: Two times a day (BID) | OROMUCOSAL | Status: DC

## 2017-01-04 MED ORDER — HALOPERIDOL LACTATE 5 MG/ML IJ SOLN
2.0000 mg | Freq: Once | INTRAMUSCULAR | Status: AC
  Administered 2017-01-04: 2 mg via INTRAVENOUS

## 2017-01-04 NOTE — Procedures (Signed)
Intubation Procedure Note Traci Thomas [id number] Oct 05, 1950  Procedure: Intubation Indications: Respiratory insufficiency  Procedure Details Consent: Risks of procedure as well as the alternatives and risks of each were explained to the (patient/caregiver).  Consent for procedure obtained. Time Out: Verified patient identification, verified procedure, site/side was marked, verified correct patient position, special equipment/implants available, medications/allergies/relevent history reviewed, required imaging and test results available.  Performed  Maximum sterile technique was used including gloves, gown, hand hygiene and mask.  MAC and 3  Grade 2 anterior view  Versed 28m fent 200 mcg Etomidate 20  After intubation, another versed 2 mg, rocuronium 50 & started precedex gtt  Evaluation Hemodynamic Status: BP stable throughout; O2 sats: transiently fell during during procedure Patient's Current Condition: stable Complications: No apparent complications Patient did tolerate procedure well. Chest X-ray ordered to verify placement.  CXR: pending.   Traci Thomas V. 01/04/2017

## 2017-01-04 NOTE — Progress Notes (Signed)
*  Preliminary Results* Left lower extremity venous duplex completed. Left lower extremity is negative for deep vein thrombosis. There is no evidence of left Baker's cyst.  01/04/2017 9:55 AM  Maudry Mayhew, BS, RVT, RDCS, RDMS

## 2017-01-04 NOTE — Progress Notes (Addendum)
Patient ID: Traci Thomas, female   DOB: Dec 02, 1950, 66 y.o.   MRN: [id number]    PROGRESS NOTE    Traci Thomas  QMV:[id number] DOB: 1950-12-25 DOA: 01/03/2017  PCP: Garwin Brothers, MD   Brief Narrative:  Pt is 66 yo female with known COPD and on home oxygen, dementia, resides at SNF, presented to [facility] wit main concern of sudden deterioration in respiratory status, progressively worsening hypoxia and wheezing. Pt required admission to SDU and placement on BiPAP, broad spectrum ABX.  There was also report of 2 weeks duration of progressive LE swelling. Review of records indicate weight on 11/30/2016 was 133 lbs and in 10/2016 was around 130 lbs as well. On this admission weight noted to be 145 lbs.   Review of most recent records:  ECHO in April 2018 with EF 50-55%, normal wall motion  Hospitalization in April 2018 for acute on chronic hypoxic and hypercapnic resp failure due to PNA< acute on chronic diastolic CHF, COPD, required BiPAP  Assessment & Plan:   Principal Problem:   Acute respiratory failure with hypoxia and hypercapnia (HCC) - this is multifactorial and secondary to acute on chronic diastolic CHF, Acute exacerbation of COPD, HCAP (bibasilar) - still on BiPAP - per ABG, pCO2 is trending down from 78 -> 54, pt remains hypoxic and still requiring BiPAP - needs to remain in SDU as pt is at high risk for clinical deterioration in terms or respiratory failure and potential intubation requirement - agree with continuing broad spectrum ABX for now even though CHF and COPD are most likely main contributors - follow up on sputum cultures and narrow ABX as quick as possible and as clinically indicated - stop IVF and place on Lasix 20 mg IV BID - BD's scheduled and as needed  - keep pt NPO while on BiPAP - will consult with PCCM   Active Problems:   Acute on chronic diastolic CHF - per record review it appears that pt is about 10 + lbs above baseline weight - will stop IVF, place on low  dose Lasix and monitor clinical response - monitor daily weights, strict I/O - no need for repeat ECHO as it was done about 3 months ago as outlined above - weight trend since admission: Filed Weights   01/03/17 0427 01/03/17 0812 01/04/17 0600  Weight: 63 kg (139 lb) 66 kg (145 lb 8.1 oz) 65.3 kg (143 lb 15.4 oz)     COPD exacerbation (HCC) - place on BD's scheduled and as needed - allow antitussives as needed  - change Prednisone to Solumedrol as pt is NPO and still wheezing - d/w PCCM    Diabetes mellitus type 2 in nonobese (HCC) - place on SSI for now    Normocytic anemia - drop in Hg overnight and suspect from dilution in the setting of IVF - no evidence of active bleeding - repeat CBC in AM    Acute metabolic encephalopathy - still with altered mentation in the setting of the acute illness imposed on dementia - monitor in SDU - will eventually need PT eval once more medically stable     Hypertension, accelerated - added Hydralazine as needed for now as pt is NPO  - depending on BP control, may need scheduled regimen   DVT prophylaxis: Lovenox SQ Code Status: On last admission pt was listed as partial code status, no CPR and no ACLS, will need to clarify goals with family) Family Communication: No family at bedside this AM  Disposition Plan: to be determined   Consultants:   PCCM  Procedures:   None  Antimicrobials:   Vancomycin 7/17 -->  Cefepime 7/17 -->  Subjective: Agitated this AM.   Objective: Vitals:   01/04/17 0800 01/04/17 0915 01/04/17 0921 01/04/17 0925  BP:   (!) 166/103   Pulse:   (!) 122   Resp:   (!) 24   Temp: 98.1 F (36.7 C)     TempSrc: Axillary     SpO2:  96% 96% 96%  Weight:      Height:        Intake/Output Summary (Last 24 hours) at 01/04/17 1025 Last data filed at 01/04/17 0601  Gross per 24 hour  Intake          1743.75 ml  Output                0 ml  Net          1743.75 ml   Filed Weights   01/03/17 0427  01/03/17 0812 01/04/17 0600  Weight: 63 kg (139 lb) 66 kg (145 lb 8.1 oz) 65.3 kg (143 lb 15.4 oz)    Examination:  General exam: Appears agitated, no resp distress, currently on BiPAP Respiratory system: Course breath sounds bilaterally with rhonchi throughout and wheezing, tachypnea  Cardiovascular system: tachycardic, No rubs, gallops or clicks. Edema noted in LE's but pt did not allowed to have LE examined  Gastrointestinal system: Abdomen is nondistended, soft and nontender. No organomegaly or masses felt.  Central nervous system: Alert but agitated, pulling lines Extremities: Power appears to be symmetric 5 x 5 in upper and lower extremities   Data Reviewed: I have personally reviewed following labs and imaging studies  CBC:  Recent Labs Lab 01/03/17 0441 01/04/17 0301  WBC 10.6* 7.9  NEUTROABS 8.8*  --   HGB 9.9* 8.8*  HCT 32.4* 28.5*  MCV 91.0 88.8  PLT 292 678   Basic Metabolic Panel:  Recent Labs Lab 01/03/17 0441 01/04/17 0301  NA 141 144  K 3.7 3.9  CL 97* 103  CO2 35* 33*  GLUCOSE 237* 119*  BUN 15 19  CREATININE 0.87 0.73  CALCIUM 9.0 8.7*   Liver Function Tests:  Recent Labs Lab 01/03/17 0441  AST 10*  ALT 9*  ALKPHOS 77  BILITOT <0.1*  PROT 6.9  ALBUMIN 3.2*   Coagulation Profile:  Recent Labs Lab 01/03/17 0441  INR 1.08   CBG:  Recent Labs Lab 01/03/17 1550 01/03/17 2015 01/04/17 0001 01/04/17 0353 01/04/17 0757  GLUCAP 94 152* 158* 118* 159*   Urine analysis:    Component Value Date/Time   COLORURINE RED (A) 10/07/2016 0925   APPEARANCEUR CLOUDY (A) 10/07/2016 0925   LABSPEC 1.015 10/07/2016 0925   PHURINE 6.0 10/07/2016 0925   GLUCOSEU 50 (A) 10/07/2016 0925   HGBUR LARGE (A) 10/07/2016 0925   BILIRUBINUR NEGATIVE 10/07/2016 0925   KETONESUR NEGATIVE 10/07/2016 0925   PROTEINUR 100 (A) 10/07/2016 0925   UROBILINOGEN 0.2 03/26/2015 1146   NITRITE NEGATIVE 10/07/2016 0925   LEUKOCYTESUR TRACE (A) 10/07/2016 0925      Recent Results (from the past 240 hour(s))  MRSA PCR Screening     Status: None   Collection Time: 01/03/17  8:08 AM  Result Value Ref Range Status   MRSA by PCR NEGATIVE NEGATIVE Final    Comment:        The GeneXpert MRSA Assay (FDA approved for NASAL specimens only), is one component  of a comprehensive MRSA colonization surveillance program. It is not intended to diagnose MRSA infection nor to guide or monitor treatment for MRSA infections.   Culture, sputum-assessment     Status: None   Collection Time: 01/03/17  9:45 AM  Result Value Ref Range Status   Specimen Description EXPECTORATED SPUTUM  Final   Special Requests NONE  Final   Sputum evaluation THIS SPECIMEN IS ACCEPTABLE FOR SPUTUM CULTURE  Final   Report Status 01/03/2017 FINAL  Final  Culture, respiratory (NON-Expectorated)     Status: None (Preliminary result)   Collection Time: 01/03/17  9:45 AM  Result Value Ref Range Status   Specimen Description EXPECTORATED SPUTUM  Final   Special Requests NONE Reflexed from [id number]  Final   Gram Stain   Final    ABUNDANT WBC PRESENT, PREDOMINANTLY PMN RARE GRAM POSITIVE COCCI RARE GRAM POSITIVE RODS RARE YEAST Performed at [facility], [address], [place], [place] [address]    Culture PENDING  Incomplete   Report Status PENDING  Incomplete    Radiology Studies: Dg Chest 2 View  Result Date: 01/03/2017 CLINICAL DATA:  66 year old female with cough and shortness of breath. EXAM: CHEST  2 VIEW COMPARISON:  Chest radiograph dated 11/02/2016 FINDINGS: Bilateral lower lobe hazy and streaky densities noted which have increased compared to the prior radiograph and may represent atelectasis versus developing infiltrate. Small pleural effusions may be present. There is no pneumothorax. The cardiac silhouette is within normal limits. Right-sided PICC with tip over central SVC. No acute osseous pathology. IMPRESSION: Interval increase in bibasilar densities compared to  the prior radiograph, atelectasis versus infiltrate. Probable small pleural effusions. Clinical correlation and follow-up recommended. Electronically Signed   By: Anner Crete M.D.   On: 01/03/2017 05:42   Scheduled Meds: . chlorhexidine  15 mL Mouth Rinse BID  . enoxaparin (LOVENOX) injection  40 mg Subcutaneous Q24H  . fluticasone  2 spray Each Nare Daily  . gabapentin  200 mg Oral QID  . insulin aspart  0-15 Units Subcutaneous Q4H  . ipratropium-albuterol  3 mL Nebulization QID  . mouth rinse  15 mL Mouth Rinse q12n4p  . oxybutynin  5 mg Oral Q breakfast  . pantoprazole  40 mg Oral QAC breakfast  . polyethylene glycol  17 g Oral BID  . predniSONE  40 mg Oral Q breakfast  . QUEtiapine  300 mg Oral QHS  . QUEtiapine  50 mg Oral Q breakfast  . senna  1 tablet Oral BID  . sodium chloride flush  3 mL Intravenous Q12H  . theophylline  300 mg Oral BID  . venlafaxine XR  150 mg Oral Q breakfast   Continuous Infusions: . sodium chloride 50 mL/hr at 01/04/17 0939  . ceFEPime (MAXIPIME) IV Stopped (01/04/17 0601)  . vancomycin 500 mg (01/04/17 0900)     LOS: 1 day   Time spent: 35 minutes   Faye Ramsay, MD [facility] Pager [phone]  If 7PM-7AM, please contact night-coverage [email or web address] Password [facility] 01/04/2017, 10:25 AM

## 2017-01-04 NOTE — Progress Notes (Signed)
eLink Physician-Brief Progress Note Patient Name: Traci Thomas DOB: Jun 25, 1950 MRN: [id number]   Date of Service  01/04/2017  HPI/Events of Note  Hypotension -BP = 55/37. Last LVEF = 50% to 55%.  eICU Interventions  Will order: 1. Bolus with 0.9 NaCl 1 liter IV over 1 hour now.  2. If no response to fluid bolus, will order Phenylephrine IV infusion.  Titrate to MAP >= 65.    Intervention Category Major Interventions: Hypotension - evaluation and management  Sommer,Steven Eugene 01/04/2017, 5:22 PM

## 2017-01-04 NOTE — Consult Note (Signed)
Name: Traci Thomas MRN: [id number] DOB: [birth year]    ADMISSION DATE:  01/03/2017 CONSULTATION DATE:  7/18  REFERRING MD :  Doyle Askew   CHIEF COMPLAINT:  Acute on chronic respiratory failure and BIPAP   BRIEF PATIENT DESCRIPTION:  66 year old female w/ acute on chronic hypoxic and hypercarbic respiratory failure in setting of HCAP +/- acute decompensated diastolic HF. PCCM asked to see on 7/18 for on-going hypoxia.   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 Presented to the ED on 7/17 w/ cc: fever 101, O2 sats 83% on 3 liters and increased shortness of breath. CXR showed new bibasilar R>L airspace disease, ABG  7.29/78 PCO2 /62 PaO2. she admitted w/ working dx of HCAP, transferred to the SDU and was placed on NIPPV, IV abx, BDs and systemic steroids for AECOPD component. Over the evening hours she was noted to be confused and agitated, taking her BIPAP mask off. On am rounds PCO2 had improved but remained hypoxic. Her BP was stable and she was noted 10 lbs up so was given IV lasix. In spite of these measures was having intermittent hypoxia so PCCM asked to assess and assist w/ care.   PAST MEDICAL HISTORY :   has a past medical history of Asthma; Back pain; COPD (chronic obstructive pulmonary disease) ([place]); and Diabetes mellitus without complication ([place]).  has a past surgical history that includes Appendectomy; Tonsillectomy; and Femur IM nail (Right, 10/21/2015). Prior to Admission medications   Medication Sig Start Date End Date Taking? Authorizing Provider  acetaminophen (TYLENOL) 325 MG tablet Take 650 mg by mouth every 4 (four) hours as needed for fever.   Yes [provider]  albuterol (PROVENTIL HFA;VENTOLIN HFA) 108 (90 BASE) MCG/ACT inhaler Inhale 2 puffs into the lungs every  6 (six) hours as needed for wheezing or shortness of breath.   Yes [provider]  albuterol (PROVENTIL) (2.5 MG/3ML) 0.083% nebulizer solution Take 3 mLs (2.5 mg total) by nebulization every 4 (four) hours as needed for wheezing or shortness of breath. 10/17/16  Yes Arrien, Jimmy Picket, MD  ALPRAZolam Duanne Moron) 0.5 MG tablet Take 1 tablet (0.5 mg total) by mouth 3 (three) times daily as needed for anxiety. Patient taking differently: Take 0.5 mg by mouth every 6 (six) hours.  10/17/16  Yes Arrien, Jimmy Picket, MD  azithromycin (ZITHROMAX) 500 MG tablet Take 500 mg by mouth daily.   Yes [provider]  CEFTAZIDIME IV Inject 1 application into the vein every 8 (eight) hours.   Yes [provider]  desvenlafaxine (PRISTIQ) 100 MG 24 hr tablet Take 100 mg by mouth daily with breakfast.    Yes [provider]  fluticasone (FLONASE) 50 MCG/ACT nasal spray Place 2 sprays into both nostrils daily.   Yes [provider]  fluticasone furoate-vilanterol (BREO ELLIPTA) 200-25 MCG/INH AEPB Inhale 1 puff into the lungs daily after breakfast.    Yes [provider]  furosemide (LASIX) 20 MG tablet Take 20 mg by mouth daily.   Yes [provider]  gabapentin (NEURONTIN) 100 MG capsule Take 200 mg by mouth 4 (four) times daily.    Yes [provider]  guaiFENesin (ROBITUSSIN) 100 MG/5ML liquid Take 200 mg by mouth every 4 (four)  hours as needed for cough.   Yes [provider]  HYDROcodone-acetaminophen (NORCO/VICODIN) 5-325 MG tablet Take 1 tablet by mouth every 6 (six) hours as needed for moderate pain. Patient taking differently: Take 1 tablet by mouth every 6 (six) hours.  10/17/16  Yes Arrien, Jimmy Picket, MD  hydrocortisone 2.5 % cream Apply 1 application topically 3 (three) times daily. Apply to face   Yes [provider]  hydroxypropyl methylcellulose / hypromellose (ISOPTO TEARS / GONIOVISC) 2.5 % ophthalmic  solution Place 1 drop into both eyes every 12 (twelve) hours as needed for dry eyes.   Yes [provider]  insulin aspart (NOVOLOG) 100 UNIT/ML injection Inject 0-15 Units into the skin 3 (three) times daily with meals. Glucose 150 to 200 use 2 units, for 201 to 250 use 4 units, for 251 to 300 use 6 units, for 301 to 350 use 8 units, for 351 or greater use 10 units. Patient taking differently: Inject 0-10 Units into the skin 3 (three) times daily with meals. Glucose 151 to 200 use 2 units, for 201 to 250 use 4 units, for 251 to 300 use 6 units, for 301 to 350 use 8 units, for 351 or greater use 10 units. Call MD if over 400 10/17/16  Yes Arrien, Jimmy Picket, MD  ipratropium-albuterol (DUONEB) 0.5-2.5 (3) MG/3ML SOLN Take 3 mLs by nebulization 4 (four) times daily.   Yes [provider]  levocetirizine (XYZAL) 5 MG tablet Take 5 mg by mouth every evening.   Yes [provider]  magnesium hydroxide (MILK OF MAGNESIA) 400 MG/5ML suspension Take 30 mLs by mouth as needed for mild constipation.   Yes [provider]  Menthol, Topical Analgesic, (ICY HOT EX) Apply 1 patch topically daily. Leave on for 8 hours   Yes [provider]  omeprazole (PRILOSEC) 20 MG capsule Take 20 mg by mouth daily before breakfast.   Yes [provider]  oxybutynin (DITROPAN-XL) 5 MG 24 hr tablet Take 5 mg by mouth daily with breakfast.   Yes [provider]  OXYGEN Inhale 6 L/min into the lungs continuous.   Yes [provider]  predniSONE (DELTASONE) 20 MG tablet Take 2 tablets (40 mg total) by mouth daily with breakfast. Take 2 tablets daily for 2 days, then 1 tablet daily for 2 days, then half tablet daily for 2 days. Patient taking differently: Take 40 mg by mouth daily with breakfast.  10/18/16  Yes Arrien, Jimmy Picket, MD  prochlorperazine (COMPAZINE) 10 MG tablet Take 10 mg by mouth every 4 (four) hours as needed for nausea or vomiting.   Yes  [provider]  QUEtiapine (SEROQUEL) 300 MG tablet Take 300 mg by mouth at bedtime.   Yes [provider]  QUEtiapine (SEROQUEL) 50 MG tablet Take 50 mg by mouth daily with breakfast.   Yes [provider]  senna-docusate (SENOKOT-S) 8.6-50 MG tablet Take 2 tablets by mouth 2 (two) times daily. 10/24/15  Yes Florencia Reasons, MD  theophylline (THEODUR) 300 MG 12 hr tablet Take 300 mg by mouth 2 (two) times daily.   Yes [provider]  tiotropium (SPIRIVA HANDIHALER) 18 MCG inhalation capsule Place 1 capsule (18 mcg total) into inhaler and inhale daily. 03/31/15  Yes Short, Noah Delaine, MD  Vitamin D, Ergocalciferol, (DRISDOL) 50000 units CAPS capsule Take 50,000 Units by mouth every Sunday.    Yes [provider]  feeding supplement, ENSURE ENLIVE, (ENSURE ENLIVE) LIQD Take 237 mLs by mouth 2 (  two) times daily between meals. Patient not taking: Reported on 11/30/2016 10/17/16   Arrien, Jimmy Picket, MD   Allergies  Allergen Reactions  . Adhesive [Tape] Other (See Comments)    Skin peels off   . Ciprofloxacin     Unknown reaction   . Latex     Unknown reaction   . Prednisone Nausea And Vomiting    FAMILY HISTORY:  family history includes COPD in her mother; Diabetes in her unknown relative. SOCIAL HISTORY:  reports that she quit smoking about 2 years ago. Her smoking use included Cigarettes. She smoked 1.00 pack per day. She has never used smokeless tobacco. She reports that she drinks alcohol. She reports that she does not use drugs.  REVIEW OF SYSTEMS:   Constitutional: Negative for fever, chills, weight loss, malaise/fatigue and diaphoresis.  HENT: Negative for hearing loss, ear pain, nosebleeds, congestion, sore throat, neck pain, tinnitus and ear discharge.   Eyes: Negative for blurred vision, double vision, photophobia, pain, discharge and redness.  Respiratory: Negative for cough, hemoptysis, sputum production, shortness of breath, wheezing and  stridor.   Cardiovascular: Negative for chest pain, palpitations, orthopnea, claudication, leg swelling and PND.  Gastrointestinal: Negative for heartburn, nausea, vomiting, abdominal pain, diarrhea, constipation, blood in stool and melena.  Genitourinary: Negative for dysuria, urgency, frequency, hematuria and flank pain.  Musculoskeletal: Negative for myalgias, back pain, joint pain and falls.  Skin: Negative for itching and rash.  Neurological: Negative for dizziness, tingling, tremors, sensory change, speech change, focal weakness, seizures, loss of consciousness, weakness and headaches.  Endo/Heme/Allergies: Negative for environmental allergies and polydipsia. Does not bruise/bleed easily.  SUBJECTIVE:   VITAL SIGNS: Temp:  [97.8 F (36.6 C)-99.2 F (37.3 C)] 98.1 F (36.7 C) (07/18 1200) Pulse Rate:  [85-130] 122 (07/18 1200) Resp:  [16-31] 24 (07/18 1200) BP: (131-187)/(77-115) 186/93 (07/18 1200) SpO2:  [87 %-96 %] 93 % (07/18 1216) FiO2 (%):  [40 %] 40 % (07/18 0925) Weight:  [143 lb 15.4 oz (65.3 kg)] 143 lb 15.4 oz (65.3 kg) (07/18 0600)  PHYSICAL EXAMINATION: General:  Chronically ill-appearing, on BiPAP fullface mask, in moderate respiratory distress Neuro:  Anxious appearing, interactive, nonfocal, confused HEENT:  No pallor, icterus, JVD Cardiovascular:  S1 and S2 tachycardia 150s regular Lungs:  Bilateral diffuse rhonchi, accessory muscle usage Abdomen:  Soft nontender Musculoskeletal:  No deformity, 1+ bipedal edema Skin:  Mild ecchymosis  CBC Recent Labs     01/03/17  0441  01/04/17  0301  WBC  10.6*  7.9  HGB  9.9*  8.8*  HCT  32.4*  28.5*  PLT  292  268    Coag's Recent Labs     01/03/17  0441  INR  1.08    BMET Recent Labs     01/03/17  0441  01/04/17  0301  NA  141  144  K  3.7  3.9  CL  97*  103  CO2  35*  33*  BUN  15  19  CREATININE  0.87  0.73  GLUCOSE  237*  119*    Electrolytes Recent Labs     01/03/17  0441  01/04/17   0301  CALCIUM  9.0  8.7*    Sepsis Markers No results for input(s): PROCALCITON, O2SATVEN in the last 72 hours.  Invalid input(s): LACTICACIDVEN  ABG Recent Labs     01/03/17  0457  01/04/17  0958  PHART  7.295*  7.397  PCO2ART  78.4*  54.3*  PO2ART  62.7*  57.9*    Liver Enzymes Recent Labs     01/03/17  0441  AST  10*  ALT  9*  ALKPHOS  77  BILITOT  <0.1*  ALBUMIN  3.2*    Cardiac Enzymes No results for input(s): TROPONINI, PROBNP in the last 72 hours.  Glucose Recent Labs     01/03/17  1550  01/03/17  2015  01/04/17  0001  01/04/17  0353  01/04/17  0757  01/04/17  1144  GLUCAP  94  152*  158*  118*  159*  128*    Imaging Dg Chest 2 View  Result Date: 01/03/2017 CLINICAL DATA:  66 year old female with cough and shortness of breath. EXAM: CHEST  2 VIEW COMPARISON:  Chest radiograph dated 11/02/2016 FINDINGS: Bilateral lower lobe hazy and streaky densities noted which have increased compared to the prior radiograph and may represent atelectasis versus developing infiltrate. Small pleural effusions may be present. There is no pneumothorax. The cardiac silhouette is within normal limits. Right-sided PICC with tip over central SVC. No acute osseous pathology. IMPRESSION: Interval increase in bibasilar densities compared to the prior radiograph, atelectasis versus infiltrate. Probable small pleural effusions. Clinical correlation and follow-up recommended. Electronically Signed   By: Anner Crete M.D.   On: 01/03/2017 05:42  Personally reviewed bibasal infiltrates new   Culture data Sputum 7/17: rare SA>>> MRSA PCR 7/17: negative BCX2 7/17>>>  abx vanc 7/16>>> Cefepime 7/16>>>   ASSESSMENT / PLAN:  Acute on chronic Hypoxic and hypercarbic respiratory failure in setting of HCAP and decompensated diastolic HF.  ->sputum growing SA Plan Seems to be failing BiPAP - we'll proceed with intubation and mechanical ventilation, We'll keep her at slightly  higher tidal volumes about 4 50 mL and lower rates due to bronchospasm Cont empiric cefepime and vancomycin Cont scheduled BDs Repeat CXR Lasix as BP, BUN and cr allow Cont IV Solu-Medrol 40 every 8  Acute on chronic diastolic HF  Accelerated Hypertension  Tachycardia  Plan Increased hydralazine (needs better BP control) Cont lasix  Tele  Hold theophylline  If bronchospasm persists, consider DC lopresor and use Cardizem instead  Acute metabolic encephalopathy.  pco2 is better. Seems more likely d/t steroids, meds, or hypoxia.  Plan Supportive care Will cont her effexor XR as able (will need to hold if intubated) Cont seroquel  We'll use Precedex and fentanyl drip with goal RASS-1  Normocytic anemia  Plan Trend cbc  DVT prophylaxis: LMWH SUP: ppi Diet: npo exccept meds Activity: bedrest  Disposition : ICU  Goals of care- discussed with son [name], he had a chance to discuss with mom prior when she was more lucid, he does desire short-term mechanical ventilation, he understands the risk of long-term ventilation given severe COPD.  The patient is critically ill with multiple organ systems failure and requires high complexity decision making for assessment and support, frequent evaluation and titration of therapies, application of advanced monitoring technologies and extensive interpretation of multiple databases. Critical Care Time devoted to patient care services described in this note independent of APP time is 50 minutes.    Kara Mead MD. [facility] Flood. [facility] Pager [phone] If no response call [phone]      01/04/2017, 1:24 PM

## 2017-01-04 NOTE — Progress Notes (Signed)
Patient has been confused, restless, and agitated throughout the night. She attempts to pull off her bipap mask, attempts to ambulate, attempts to remove IVs and monitoring equipment. Staff has not been able to re-direct her. MD made aware. PRNs ordere and given, but her condition has not improved.

## 2017-01-05 ENCOUNTER — Inpatient Hospital Stay (HOSPITAL_COMMUNITY): Payer: Medicare Other

## 2017-01-05 LAB — CBC
HEMATOCRIT: 29.5 % — AB (ref 36.0–46.0)
Hemoglobin: 8.9 g/dL — ABNORMAL LOW (ref 12.0–15.0)
MCH: 27.9 pg (ref 26.0–34.0)
MCHC: 30.2 g/dL (ref 30.0–36.0)
MCV: 92.5 fL (ref 78.0–100.0)
Platelets: 301 10*3/uL (ref 150–400)
RBC: 3.19 MIL/uL — AB (ref 3.87–5.11)
RDW: 16.5 % — AB (ref 11.5–15.5)
WBC: 10.4 10*3/uL (ref 4.0–10.5)

## 2017-01-05 LAB — BASIC METABOLIC PANEL
ANION GAP: 9 (ref 5–15)
BUN: 31 mg/dL — ABNORMAL HIGH (ref 6–20)
CALCIUM: 8.1 mg/dL — AB (ref 8.9–10.3)
CO2: 31 mmol/L (ref 22–32)
Chloride: 103 mmol/L (ref 101–111)
Creatinine, Ser: 1.19 mg/dL — ABNORMAL HIGH (ref 0.44–1.00)
GFR, EST AFRICAN AMERICAN: 55 mL/min — AB (ref >60)
GFR, EST NON AFRICAN AMERICAN: 47 mL/min — AB (ref >60)
Glucose, Bld: 146 mg/dL — ABNORMAL HIGH (ref 65–99)
POTASSIUM: 4 mmol/L (ref 3.5–5.1)
Sodium: 143 mmol/L (ref 135–145)

## 2017-01-05 LAB — CULTURE, RESPIRATORY W GRAM STAIN

## 2017-01-05 LAB — GLUCOSE, CAPILLARY
GLUCOSE-CAPILLARY: 98 mg/dL (ref 65–99)
GLUCOSE-CAPILLARY: 163 mg/dL — AB (ref 65–99)
GLUCOSE-CAPILLARY: 140 mg/dL — AB (ref 65–99)
Glucose-Capillary: 128 mg/dL — ABNORMAL HIGH (ref 65–99)
Glucose-Capillary: 121 mg/dL — ABNORMAL HIGH (ref 65–99)
Glucose-Capillary: 130 mg/dL — ABNORMAL HIGH (ref 65–99)

## 2017-01-05 LAB — HIV ANTIBODY (ROUTINE TESTING W REFLEX): HIV SCREEN 4TH GENERATION: NONREACTIVE

## 2017-01-05 LAB — VANCOMYCIN, TROUGH: Vancomycin Tr: 16 ug/mL (ref 15–20)

## 2017-01-05 LAB — CULTURE, RESPIRATORY

## 2017-01-05 MED ORDER — SENNA 8.6 MG PO TABS
1.0000 | ORAL_TABLET | Freq: Two times a day (BID) | ORAL | Status: DC
  Administered 2017-01-06 – 2017-01-08 (×5): 8.6 mg
  Filled 2017-01-05 (×6): qty 1

## 2017-01-05 MED ORDER — SODIUM CHLORIDE 0.9% FLUSH
10.0000 mL | Freq: Two times a day (BID) | INTRAVENOUS | Status: DC
Start: 2017-01-05 — End: 2017-01-17
  Administered 2017-01-05 – 2017-01-08 (×4): 10 mL
  Administered 2017-01-08 – 2017-01-09 (×2): 20 mL
  Administered 2017-01-09 – 2017-01-14 (×3): 10 mL

## 2017-01-05 MED ORDER — SODIUM CHLORIDE 0.9% FLUSH
10.0000 mL | INTRAVENOUS | Status: DC | PRN
  Administered 2017-01-08: 20 mL
  Administered 2017-01-13 – 2017-01-14 (×3): 10 mL
  Filled 2017-01-05 (×4): qty 40

## 2017-01-05 MED ORDER — VITAL HIGH PROTEIN PO LIQD: 1000.0000 mL | ORAL | Status: DC

## 2017-01-05 MED ORDER — VITAL AF 1.2 CAL PO LIQD
1000.0000 mL | ORAL | Status: DC
  Administered 2017-01-05: 1000 mL
  Filled 2017-01-05 (×3): qty 1000

## 2017-01-05 MED ORDER — VANCOMYCIN HCL IN DEXTROSE 1-5 GM/200ML-% IV SOLN
1000.0000 mg | INTRAVENOUS | Status: DC
  Administered 2017-01-05 – 2017-01-13 (×8): 1000 mg via INTRAVENOUS
  Filled 2017-01-05 (×9): qty 200

## 2017-01-05 MED ORDER — CEFEPIME HCL 1 G IJ SOLR
1.0000 g | Freq: Two times a day (BID) | INTRAMUSCULAR | Status: DC
  Administered 2017-01-05 – 2017-01-07 (×5): 1 g via INTRAVENOUS
  Filled 2017-01-05 (×5): qty 1

## 2017-01-05 MED ORDER — QUETIAPINE FUMARATE 50 MG PO TABS
50.0000 mg | ORAL_TABLET | Freq: Every day | ORAL | Status: DC
  Administered 2017-01-06 – 2017-01-08 (×3): 50 mg
  Filled 2017-01-05 (×3): qty 1

## 2017-01-05 MED ORDER — POLYETHYLENE GLYCOL 3350 17 G PO PACK
17.0000 g | PACK | Freq: Two times a day (BID) | ORAL | Status: DC
  Administered 2017-01-07 – 2017-01-08 (×4): 17 g
  Filled 2017-01-05 (×4): qty 1

## 2017-01-05 MED ORDER — QUETIAPINE FUMARATE 100 MG PO TABS
300.0000 mg | ORAL_TABLET | Freq: Every day | ORAL | Status: DC
  Administered 2017-01-06 – 2017-01-08 (×3): 300 mg
  Filled 2017-01-05 (×3): qty 3

## 2017-01-05 MED ORDER — CHLORHEXIDINE GLUCONATE CLOTH 2 % EX PADS
6.0000 | MEDICATED_PAD | Freq: Every day | CUTANEOUS | Status: DC
  Administered 2017-01-06 – 2017-01-16 (×12): 6 via TOPICAL

## 2017-01-05 NOTE — Progress Notes (Signed)
Pharmacy Antibiotic Note  Traci Thomas is a 66 y.o. female admitted on 01/03/2017 with pneumonia.  Pt has known hx of COPD, came from facility with fever and respiratory distress.  Pharmacy has been consulted for Cefepime and Vancomycin dosing.  Today, 01/05/2017 S/p intubation, hypotension yesterday.  Lasix increased, PE gtt off. SCr increased (0.7 > 1.2) with CrCl ~ 39 ml/min WBC 10.4 Tmax 99.5  Plan:  Cefepime 1g IV q12h  Change to Vancomycin 1g IV q24h.  Measure Vanc trough at steady state.  Follow up renal fxn, culture results, and clinical course.   Height: 4\' 11"  (149.9 cm) Weight: 144 lb 10 oz (65.6 kg) IBW/kg (Calculated) : 43.2  Temp (24hrs), Avg:98.7 F (37.1 C), Min:98.1 F (36.7 C), Max:99.5 F (37.5 C)   Recent Labs Lab 01/03/17 0441 01/03/17 0450 01/04/17 0301 01/05/17 0320  WBC 10.6*  --  7.9 10.4  CREATININE 0.87  --  0.73 1.19*  LATICACIDVEN  --  0.78  --   --     Estimated Creatinine Clearance: 39.4 mL/min (A) (by C-G formula based on SCr of 1.19 mg/dL (H)).    Allergies  Allergen Reactions  . Adhesive [Tape] Other (See Comments)    Skin peels off   . Ciprofloxacin     Unknown reaction   . Latex     Unknown reaction   . Prednisone Nausea And Vomiting    Antimicrobials this admission: 7/17 Vancomycin >>  7/17 Cefepime >>   Dose adjustments this admission: 7/19 0730 VT = 16 on vanc 500 q12 (SCr increasing, prior to 4th dose)  Microbiology results: 7/17 BCx: ngtd 7/17 MRSA PCR: (-) 7/17 Sputum (expectorated): rare Staph aureus 7/18 Trach aspirate: pending  Thank you for allowing pharmacy to be a part of this patient's care.  Gretta Arab PharmD, BCPS Pager [phone] 01/05/2017 7:08 AM

## 2017-01-05 NOTE — Progress Notes (Signed)
eLink Physician-Brief Progress Note Patient Name: Traci Thomas DOB: May 19, 1951 MRN: [id number]   Date of Service  01/05/2017  HPI/Events of Note  Agitation - Request for bilateral soft wrist restraints.   eICU Interventions  Will order bilateral soft wrist restraints.      Intervention Category Minor Interventions: Agitation / anxiety - evaluation and management  Leona Pressly Eugene 01/05/2017, 7:26 PM

## 2017-01-05 NOTE — Progress Notes (Signed)
eLink Physician-Brief Progress Note Patient Name: Traci Thomas DOB: July 26, 1950 MRN: [id number]   Date of Service  01/05/2017  HPI/Events of Note  Request to change Seroquel and Miralax from PO to per tube.   eICU Interventions  Will change Seroquel and Miralax to per tube.      Intervention Category Intermediate Interventions: Other:  Lysle Dingwall 01/05/2017, 9:31 PM

## 2017-01-05 NOTE — Progress Notes (Addendum)
Nutrition Follow-up  DOCUMENTATION CODES:   Not applicable  INTERVENTION:  - Will order Vital AF 1.2 @ 45 mL/hr to provide 1296 kcal (98% estimated kcal need), 81 grams of protein, and 876 mL free water.  - PEPuP protocol.  - Will continue to monitor vent status.   NUTRITION DIAGNOSIS:   Inadequate oral intake related to inability to eat as evidenced by NPO status. -ongoing  GOAL:   Patient will meet greater than or equal to 90% of their needs -unable to meet   MONITOR:   Vent status, Weight trends, Labs  REASON FOR ASSESSMENT:   Ventilator, Consult  TF initiation and management  ASSESSMENT:   66 year old woman PMH COPD, chronic respiratory failure on home oxygen, diabetes, dementia presented to the emergency department with shortness of breath, hypoxia. Placed on BiPAP, treated with antibiotics and referred for admission for respiratory acidosis, pneumonia, COPD exacerbation, acute respiratory failure.  7/19 Pt previously on BiPAP and required intubation which was done yesterday at ~1600 and OGT placed at that time. Talked with Dr. Vaughan Browner this AM who reports plan to trial weaning and if unable to successfully wean toward extubation, plan to start TF via OGT. Weight has been stable so current weight (65.6 kg) used in re-estimating nutrition needs. Pt awake and tracks RD around the room. RN reports that pt looks better today than yesterday. RN spoke with RT and reports pt unable to wean today d/t FiO2 at this time.   Patient is currently intubated on ventilator support MV: 6.5 L/min Temp (24hrs), Avg:98.9 F (37.2 C), Min:98.1 F (36.7 C), Max:99.5 F (37.5 C) Propofol: none BP: 112/65 and MAP: 82  Medications reviewed; 20 mg IV Lasix BID, sliding scale Novolog, 40 mg Solu-medrol BID, 40 mg Protonix per OGT/day, 1 packet Miralax per OGT BID, 1 tablet Senokot per OGT BID. Labs reviewed; CBGs: 128 and 121 mg/dL this AM, BUN: 31 mg/dL, creatinine: 1.19 mg/dL, Ca: 8.1 mg/dL,  GFR: 47 mL/min.  Drip: Fentanyl @ 100 mcg/hr.     7/17 - Weight +5.2 kg/12 lbs since 11/30/16 so weight from that time (60.8 kg) used in estimating nutrition needs. - Pt has been NPO since admission.  - No family/visitors present at this time.  - Pt seems to have some degree of confusion/hallucination. - Pt states that she has abdominal pressure, as if she needs to have a BM.  - Physical assessment shows no muscle and no fat wasting; mild to moderate edema to BLE.   IVF: NS @ 75 mL/hr.    Diet Order:  Diet NPO time specified  Skin:  Reviewed, no issues  Last BM:  7/18  Height:   Ht Readings from Last 1 Encounters:  01/03/17 4\' 11"  (1.499 m)    Weight:   Wt Readings from Last 1 Encounters:  01/05/17 144 lb 10 oz (65.6 kg)    Ideal Body Weight:  42.91 kg  BMI:  Body mass index is 29.21 kg/m.  Estimated Nutritional Needs:   Kcal:  1325  Protein:  80-100 grams (~1.2-1.5 grams/kg)  Fluid:  1.5 L/day  EDUCATION NEEDS:   No education needs identified at this time    Jarome Matin, MS, RD, LDN, CNSC Inpatient Clinical Dietitian Pager # [phone] After hours/weekend pager # [phone]

## 2017-01-05 NOTE — Progress Notes (Signed)
Name: Traci Thomas MRN: [id number] DOB: Apr 25, 1951    ADMISSION DATE:  01/03/2017 CONSULTATION DATE:  7/18  REFERRING MD :  Doyle Askew   CHIEF COMPLAINT:  Acute on chronic respiratory failure and BIPAP   BRIEF PATIENT DESCRIPTION:  66 year old female w/ acute on chronic hypoxic and hypercarbic respiratory failure in setting of HCAP +/- acute decompensated diastolic HF. PCCM asked to see on 7/18 for on-going hypoxia.   SIGNIFICANT EVENTS  Intubated 7/19 after she failed Bipap Stable overnight  STUDIES:    HISTORY OF PRESENT ILLNESS:   This is a 65 year old female w/ chronic respiratory failure in the setting of COPD (stage 4 class D on 3 liters O2, breo, spiriva and theophylline) , HFpEF, resides in SNF. Presented to the ED on 7/17 w/ cc: fever 101, O2 sats 83% on 3 liters and increased shortness of breath. CXR showed new bibasilar R>L airspace disease, ABG  7.29/78 PCO2 /62 PaO2. she admitted w/ working dx of HCAP, transferred to the SDU and was placed on NIPPV, IV abx, BDs and systemic steroids for AECOPD component. Over the evening hours she was noted to be confused and agitated, taking her BIPAP mask off. On am rounds PCO2 had improved but remained hypoxic. Her BP was stable and she was noted 10 lbs up so was given IV lasix. In spite of these measures was having intermittent hypoxia so PCCM asked to assess and assist w/ care.   PAST MEDICAL HISTORY :   has a past medical history of Asthma; Back pain; COPD (chronic obstructive pulmonary disease) ([place]); and Diabetes mellitus without complication ([place]).  has a past surgical history that includes Appendectomy; Tonsillectomy; and Femur IM nail (Right, 10/21/2015). Prior to Admission medications   Medication Sig Start Date End Date Taking? Authorizing Provider  acetaminophen (TYLENOL) 325 MG tablet Take 650 mg by mouth every 4 (four) hours as needed for fever.   Yes [provider]  albuterol (PROVENTIL HFA;VENTOLIN HFA) 108 (90 BASE)  MCG/ACT inhaler Inhale 2 puffs into the lungs every 6 (six) hours as needed for wheezing or shortness of breath.   Yes [provider]  albuterol (PROVENTIL) (2.5 MG/3ML) 0.083% nebulizer solution Take 3 mLs (2.5 mg total) by nebulization every 4 (four) hours as needed for wheezing or shortness of breath. 10/17/16  Yes Arrien, Jimmy Picket, MD  ALPRAZolam Duanne Moron) 0.5 MG tablet Take 1 tablet (0.5 mg total) by mouth 3 (three) times daily as needed for anxiety. Patient taking differently: Take 0.5 mg by mouth every 6 (six) hours.  10/17/16  Yes Arrien, Jimmy Picket, MD  azithromycin (ZITHROMAX) 500 MG tablet Take 500 mg by mouth daily.   Yes [provider]  CEFTAZIDIME IV Inject 1 application into the vein every 8 (eight) hours.   Yes [provider]  desvenlafaxine (PRISTIQ) 100 MG 24 hr tablet Take 100 mg by mouth daily with breakfast.    Yes [provider]  fluticasone (FLONASE) 50 MCG/ACT nasal spray Place 2 sprays into both nostrils daily.   Yes [provider]  fluticasone furoate-vilanterol (BREO ELLIPTA) 200-25 MCG/INH AEPB Inhale 1 puff into the lungs daily after breakfast.    Yes [provider]  furosemide (LASIX) 20 MG tablet Take 20 mg by mouth daily.   Yes [provider]  gabapentin (NEURONTIN) 100 MG capsule Take 200 mg by mouth 4 (four) times daily.    Yes [provider]  guaiFENesin (ROBITUSSIN) 100 MG/5ML liquid Take 200  mg by mouth every 4 (four) hours as needed for cough.   Yes [provider]  HYDROcodone-acetaminophen (NORCO/VICODIN) 5-325 MG tablet Take 1 tablet by mouth every 6 (six) hours as needed for moderate pain. Patient taking differently: Take 1 tablet by mouth every 6 (six) hours.  10/17/16  Yes Arrien, Jimmy Picket, MD  hydrocortisone 2.5 % cream Apply 1 application topically 3 (three) times daily. Apply to face   Yes [provider]  hydroxypropyl methylcellulose /  hypromellose (ISOPTO TEARS / GONIOVISC) 2.5 % ophthalmic solution Place 1 drop into both eyes every 12 (twelve) hours as needed for dry eyes.   Yes [provider]  insulin aspart (NOVOLOG) 100 UNIT/ML injection Inject 0-15 Units into the skin 3 (three) times daily with meals. Glucose 150 to 200 use 2 units, for 201 to 250 use 4 units, for 251 to 300 use 6 units, for 301 to 350 use 8 units, for 351 or greater use 10 units. Patient taking differently: Inject 0-10 Units into the skin 3 (three) times daily with meals. Glucose 151 to 200 use 2 units, for 201 to 250 use 4 units, for 251 to 300 use 6 units, for 301 to 350 use 8 units, for 351 or greater use 10 units. Call MD if over 400 10/17/16  Yes Arrien, Jimmy Picket, MD  ipratropium-albuterol (DUONEB) 0.5-2.5 (3) MG/3ML SOLN Take 3 mLs by nebulization 4 (four) times daily.   Yes [provider]  levocetirizine (XYZAL) 5 MG tablet Take 5 mg by mouth every evening.   Yes [provider]  magnesium hydroxide (MILK OF MAGNESIA) 400 MG/5ML suspension Take 30 mLs by mouth as needed for mild constipation.   Yes [provider]  Menthol, Topical Analgesic, (ICY HOT EX) Apply 1 patch topically daily. Leave on for 8 hours   Yes [provider]  omeprazole (PRILOSEC) 20 MG capsule Take 20 mg by mouth daily before breakfast.   Yes [provider]  oxybutynin (DITROPAN-XL) 5 MG 24 hr tablet Take 5 mg by mouth daily with breakfast.   Yes [provider]  OXYGEN Inhale 6 L/min into the lungs continuous.   Yes [provider]  predniSONE (DELTASONE) 20 MG tablet Take 2 tablets (40 mg total) by mouth daily with breakfast. Take 2 tablets daily for 2 days, then 1 tablet daily for 2 days, then half tablet daily for 2 days. Patient taking differently: Take 40 mg by mouth daily with breakfast.  10/18/16  Yes Arrien, Jimmy Picket, MD  prochlorperazine (COMPAZINE) 10 MG tablet Take 10 mg by mouth every 4  (four) hours as needed for nausea or vomiting.   Yes [provider]  QUEtiapine (SEROQUEL) 300 MG tablet Take 300 mg by mouth at bedtime.   Yes [provider]  QUEtiapine (SEROQUEL) 50 MG tablet Take 50 mg by mouth daily with breakfast.   Yes [provider]  senna-docusate (SENOKOT-S) 8.6-50 MG tablet Take 2 tablets by mouth 2 (two) times daily. 10/24/15  Yes Florencia Reasons, MD  theophylline (THEODUR) 300 MG 12 hr tablet Take 300 mg by mouth 2 (two) times daily.   Yes [provider]  tiotropium (SPIRIVA HANDIHALER) 18 MCG inhalation capsule Place 1 capsule (18 mcg total) into inhaler and inhale daily. 03/31/15  Yes Short, Noah Delaine, MD  Vitamin D, Ergocalciferol, (DRISDOL) 50000 units CAPS capsule Take 50,000 Units by mouth every Sunday.    Yes [provider]  feeding supplement, ENSURE ENLIVE, (ENSURE ENLIVE) LIQD  Take 237 mLs by mouth 2 (two) times daily between meals. Patient not taking: Reported on 11/30/2016 10/17/16   Arrien, Jimmy Picket, MD   Allergies  Allergen Reactions  . Adhesive [Tape] Other (See Comments)    Skin peels off   . Ciprofloxacin     Unknown reaction   . Latex     Unknown reaction   . Prednisone Nausea And Vomiting    FAMILY HISTORY:  family history includes COPD in her mother; Diabetes in her unknown relative. SOCIAL HISTORY:  reports that she quit smoking about 2 years ago. Her smoking use included Cigarettes. She smoked 1.00 pack per day. She has never used smokeless tobacco. She reports that she drinks alcohol. She reports that she does not use drugs.  REVIEW OF SYSTEMS:   Constitutional: Negative for fever, chills, weight loss, malaise/fatigue and diaphoresis.  HENT: Negative for hearing loss, ear pain, nosebleeds, congestion, sore throat, neck pain, tinnitus and ear discharge.   Eyes: Negative for blurred vision, double vision, photophobia, pain, discharge and redness.  Respiratory: Negative for cough, hemoptysis,  sputum production, shortness of breath, wheezing and stridor.   Cardiovascular: Negative for chest pain, palpitations, orthopnea, claudication, leg swelling and PND.  Gastrointestinal: Negative for heartburn, nausea, vomiting, abdominal pain, diarrhea, constipation, blood in stool and melena.  Genitourinary: Negative for dysuria, urgency, frequency, hematuria and flank pain.  Musculoskeletal: Negative for myalgias, back pain, joint pain and falls.  Skin: Negative for itching and rash.  Neurological: Negative for dizziness, tingling, tremors, sensory change, speech change, focal weakness, seizures, loss of consciousness, weakness and headaches.  Endo/Heme/Allergies: Negative for environmental allergies and polydipsia. Does not bruise/bleed easily.  SUBJECTIVE:   VITAL SIGNS: Temp:  [98.1 F (36.7 C)-99.5 F (37.5 C)] 99.5 F (37.5 C) (07/19 0400) Pulse Rate:  [82-133] 103 (07/19 0900) Resp:  [14-31] 14 (07/19 0900) BP: (70-187)/(30-141) 112/65 (07/19 0900) SpO2:  [83 %-97 %] 90 % (07/19 0900) FiO2 (%):  [40 %-70 %] 60 % (07/19 0826) Weight:  [144 lb 10 oz (65.6 kg)] 144 lb 10 oz (65.6 kg) (07/19 0400)  PHYSICAL EXAMINATION: Gen:      No acute distress HEENT:  EOMI, sclera anicteric, ETT in place Neck:     No masses; no thyromegaly Lungs:    Clear to auscultation bilaterally; normal respiratory effort* CV:         Regular rate and rhythm; no murmurs Abd:      + bowel sounds; soft, non-tender; no palpable masses, no distension Ext:    No edema; adequate peripheral perfusion Skin:      Warm and dry; no rash Neuro: Awake, obeys commands, intermittent jerking of extremities. No focal deficits   CBC Recent Labs     01/03/17  0441  01/04/17  0301  01/05/17  0320  WBC  10.6*  7.9  10.4  HGB  9.9*  8.8*  8.9*  HCT  32.4*  28.5*  29.5*  PLT  292  268  301    Coag's Recent Labs     01/03/17  0441  INR  1.08    BMET Recent Labs     01/03/17  0441  01/04/17  0301   01/05/17  0320  NA  141  144  143  K  3.7  3.9  4.0  CL  97*  103  103  CO2  35*  33*  31  BUN  15  19  31*  CREATININE  0.87  0.73  1.19*  GLUCOSE  237*  119*  146*    Electrolytes Recent Labs     01/03/17  0441  01/04/17  0301  01/05/17  0320  CALCIUM  9.0  8.7*  8.1*    Sepsis Markers No results for input(s): PROCALCITON, O2SATVEN in the last 72 hours.  Invalid input(s): LACTICACIDVEN  ABG Recent Labs     01/03/17  0457  01/04/17  0958  01/04/17  1715  PHART  7.295*  7.397  7.333*  PCO2ART  78.4*  54.3*  63.8*  PO2ART  62.7*  57.9*  72.6*    Liver Enzymes Recent Labs     01/03/17  0441  AST  10*  ALT  9*  ALKPHOS  77  BILITOT  <0.1*  ALBUMIN  3.2*    Cardiac Enzymes No results for input(s): TROPONINI, PROBNP in the last 72 hours.  Glucose Recent Labs     01/04/17  1144  01/04/17  1530  01/04/17  1945  01/04/17  2331  01/05/17  0541  01/05/17  0728  GLUCAP  128*  215*  161*  107*  128*  121*    Imaging Dg Abd 1 View  Result Date: 01/04/2017 CLINICAL DATA:  Orogastric tube placement EXAM: ABDOMEN - 1 VIEW COMPARISON:  10/07/2016 FINDINGS: An orogastric tube tip overlaps the distal stomach, which is somewhat low. Normal bowel gas pattern. Streaky lower lung opacities; there is contemporaneous chest x-ray. Nonobstructive bowel gas pattern. EKG leads create artifact over the abdomen chest. IMPRESSION: The orogastric tube tip overlaps the distal stomach. Electronically Signed   By: Monte Fantasia M.D.   On: 01/04/2017 17:09   Portable Chest Xray  Result Date: 01/05/2017 CLINICAL DATA:  Respiratory failure. EXAM: PORTABLE CHEST 1 VIEW COMPARISON:  01/04/2017 . FINDINGS: Endotracheal tube, NG tube, right PICC line in stable position. Heart size normal. Low lung volumes . Bibasilar pulmonary infiltrates and bilateral pleural effusions again noted. No pneumothorax. IMPRESSION: 1. Lines and tubes in stable position. 2. Low lung volumes with mild bibasilar  atelectasis and infiltrates. Small bilateral pleural effusions. Chest is stable from prior exam . Electronically Signed   By: Marcello Moores  Register   On: 01/05/2017 07:29   Portable Chest Xray  Result Date: 01/04/2017 CLINICAL DATA:  Acute respiratory failure. Endotracheal tube placement EXAM: PORTABLE CHEST 1 VIEW COMPARISON:  01/04/2017 FINDINGS: Endotracheal tube in good position. Central venous catheter tip in the SVC unchanged. NG tube has been placed with tip in the stomach Progressive atelectasis in the lung bases bilaterally right greater than left. Negative for heart failure or edema. Right pleural effusion. IMPRESSION: Endotracheal tube in good position Progression of bibasilar atelectasis Electronically Signed   By: Franchot Gallo M.D.   On: 01/04/2017 16:53   Dg Chest Port 1 View  Result Date: 01/04/2017 CLINICAL DATA:  Pneumonia, shortness of breath EXAM: PORTABLE CHEST 1 VIEW COMPARISON:  01/03/2017, 09/29/2016 FINDINGS: Bibasilar airspace disease similar in appearance of multiple prior examinations. Possible trace right pleural effusion. No pneumothorax. Stable cardiomediastinal silhouette. Right sided PICC line with the tip projecting over the SVC. IMPRESSION: Persistent bibasilar airspace disease which may reflect atelectasis versus scarring versus pneumonia. Electronically Signed   By: Kathreen Devoid   On: 01/04/2017 15:01  Personally reviewed bibasal infiltrates new   Culture data Sputum 7/17: rare SA>>> MRSA PCR 7/17: negative BCX2 7/17>>>  abx Vanc 7/16>>> Cefepime 7/16>>>   ASSESSMENT / PLAN: Acute on chronic Hypoxic and hypercarbic respiratory failure in setting of HCAP and decompensated  diastolic HF.  Plan Continue vent support Start PSV trials Cont empiric cefepime and vancomycin Cont scheduled BDs Continue solumedrol 40 mg q8 Follow CXR  Acute on chronic diastolic HF  Accelerated Hypertension  Tachycardia  Plan Continue hydralazine Continue lasix Tele  Hold  theophylline  If bronchospasm persists, consider DC lopresor and use Cardizem instead  Acute metabolic encephalopathy.  pco2 is better. Seems more likely d/t steroids, meds, or hypoxia.  Plan Supportive care Holidng effexor Cont seroquel  Fentanyl gtt for sedation  Normocytic anemia  Plan Trend cbc  DVT prophylaxis: LMWH SUP: ppi Diet: npo exccept meds Activity: bedrest  Disposition : ICU  Goals of care- son [name] updated at bedside.  The patient is critically ill with multiple organ system failure and requires high complexity decision making for assessment and support, frequent evaluation and titration of therapies, advanced monitoring, review of radiographic studies and interpretation of complex data.   Critical Care Time devoted to patient care services, exclusive of separately billable procedures, described in this note is 35 minutes.   Marshell Garfinkel MD [facility] Pager [phone] If no answer or after 3pm call: [phone] 01/05/2017, 9:33 AM

## 2017-01-06 ENCOUNTER — Inpatient Hospital Stay (HOSPITAL_COMMUNITY): Payer: Medicare Other

## 2017-01-06 ENCOUNTER — Inpatient Hospital Stay (HOSPITAL_COMMUNITY)
Admit: 2017-01-06 | Discharge: 2017-01-06 | Disposition: A | Discharge status: Home / Self Care | Payer: Medicare Other | Attending: Pulmonary Disease | Admitting: Pulmonary Disease

## 2017-01-06 DIAGNOSIS — R4182 Altered mental status, unspecified: Secondary | ICD-10-CM

## 2017-01-06 LAB — CBC
HCT: 28.7 % — ABNORMAL LOW (ref 36.0–46.0)
HEMOGLOBIN: 8.6 g/dL — AB (ref 12.0–15.0)
MCH: 27.7 pg (ref 26.0–34.0)
MCHC: 30 g/dL (ref 30.0–36.0)
MCV: 92.6 fL (ref 78.0–100.0)
PLATELETS: 244 10*3/uL (ref 150–400)
RBC: 3.1 MIL/uL — ABNORMAL LOW (ref 3.87–5.11)
RDW: 16.8 % — AB (ref 11.5–15.5)
WBC: 6.7 10*3/uL (ref 4.0–10.5)

## 2017-01-06 LAB — BASIC METABOLIC PANEL
Anion gap: 8 (ref 5–15)
BUN: 42 mg/dL — AB (ref 6–20)
CALCIUM: 8.5 mg/dL — AB (ref 8.9–10.3)
CO2: 31 mmol/L (ref 22–32)
CREATININE: 1.22 mg/dL — AB (ref 0.44–1.00)
Chloride: 104 mmol/L (ref 101–111)
GFR, EST AFRICAN AMERICAN: 53 mL/min — AB (ref >60)
GFR, EST NON AFRICAN AMERICAN: 46 mL/min — AB (ref >60)
Glucose, Bld: 193 mg/dL — ABNORMAL HIGH (ref 65–99)
Potassium: 4.5 mmol/L (ref 3.5–5.1)
SODIUM: 143 mmol/L (ref 135–145)

## 2017-01-06 LAB — GLUCOSE, CAPILLARY
GLUCOSE-CAPILLARY: 171 mg/dL — AB (ref 65–99)
GLUCOSE-CAPILLARY: 124 mg/dL — AB (ref 65–99)
GLUCOSE-CAPILLARY: 165 mg/dL — AB (ref 65–99)
GLUCOSE-CAPILLARY: 141 mg/dL — AB (ref 65–99)
Glucose-Capillary: 181 mg/dL — ABNORMAL HIGH (ref 65–99)
Glucose-Capillary: 165 mg/dL — ABNORMAL HIGH (ref 65–99)

## 2017-01-06 LAB — BLOOD GAS, ARTERIAL
Acid-Base Excess: 3.6 mmol/L — ABNORMAL HIGH (ref 0.0–2.0)
BICARBONATE: 29.8 mmol/L — AB (ref 20.0–28.0)
Drawn by: 414221
FIO2: 60
LHR: 14 {breaths}/min
O2 Saturation: 93.8 %
PCO2 ART: 59.7 mmHg — AB (ref 32.0–48.0)
PEEP: 5 cmH2O
Patient temperature: 99.8
VT: 450 mL
pH, Arterial: 7.323 — ABNORMAL LOW (ref 7.350–7.450)
pO2, Arterial: 76 mmHg — ABNORMAL LOW (ref 83.0–108.0)

## 2017-01-06 LAB — PHOSPHORUS: Phosphorus: 3.8 mg/dL (ref 2.5–4.6)

## 2017-01-06 LAB — MAGNESIUM: Magnesium: 2.3 mg/dL (ref 1.7–2.4)

## 2017-01-06 MED ORDER — VITAL AF 1.2 CAL PO LIQD
1000.0000 mL | ORAL | Status: DC
  Administered 2017-01-06 – 2017-01-08 (×3): 1000 mL
  Filled 2017-01-06 (×5): qty 1000

## 2017-01-06 MED ORDER — PANTOPRAZOLE SODIUM 40 MG PO PACK
40.0000 mg | PACK | Freq: Every day | ORAL | Status: DC
  Administered 2017-01-06 – 2017-01-08 (×3): 40 mg
  Filled 2017-01-06 (×3): qty 20

## 2017-01-06 MED ORDER — CHLORHEXIDINE GLUCONATE 0.12% ORAL RINSE (MEDLINE KIT)
15.0000 mL | Freq: Two times a day (BID) | OROMUCOSAL | Status: DC
  Administered 2017-01-06 – 2017-01-17 (×14): 15 mL via OROMUCOSAL

## 2017-01-06 MED ORDER — ORAL CARE MOUTH RINSE
15.0000 mL | Freq: Four times a day (QID) | OROMUCOSAL | Status: DC
  Administered 2017-01-06 – 2017-01-16 (×26): 15 mL via OROMUCOSAL

## 2017-01-06 NOTE — Progress Notes (Addendum)
Name: DE LIBMAN MRN: [id number] DOB: 09-Jan-1951    ADMISSION DATE:  01/03/2017 CONSULTATION DATE:  7/18  REFERRING MD :  Doyle Askew   CHIEF COMPLAINT:  Acute on chronic respiratory failure and BIPAP   BRIEF PATIENT DESCRIPTION:  66 year old female w/ acute on chronic hypoxic and hypercarbic respiratory failure in setting of HCAP +/- acute decompensated diastolic HF. PCCM asked to see on 7/18 for on-going hypoxia.   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 Presented to the ED on 7/17 w/ cc: fever 101, O2 sats 83% on 3 liters and increased shortness of breath. CXR showed new bibasilar R>L airspace disease, ABG  7.29/78 PCO2 /62 PaO2. she admitted w/ working dx of HCAP, transferred to the SDU and was placed on NIPPV, IV abx, BDs and systemic steroids for AECOPD component. Over the evening hours she was noted to be confused and agitated, taking her BIPAP mask off. On am rounds PCO2 had improved but remained hypoxic. Her BP was stable and she was noted 10 lbs up so was given IV lasix. In spite of these measures was having intermittent hypoxia so PCCM asked to assess and assist w/ care.   PAST MEDICAL HISTORY :   has a past medical history of Asthma; Back pain; COPD (chronic obstructive pulmonary disease) ([place]); and Diabetes mellitus without complication ([place]).  has a past surgical history that includes Appendectomy; Tonsillectomy; and Femur IM nail (Right, 10/21/2015). Prior to Admission medications   Medication Sig Start Date End Date Taking? Authorizing Provider  acetaminophen (TYLENOL) 325 MG tablet Take 650 mg by mouth every 4 (four) hours as needed for fever.   Yes [provider]  albuterol (PROVENTIL HFA;VENTOLIN HFA) 108 (90 BASE) MCG/ACT inhaler  Inhale 2 puffs into the lungs every 6 (six) hours as needed for wheezing or shortness of breath.   Yes [provider]  albuterol (PROVENTIL) (2.5 MG/3ML) 0.083% nebulizer solution Take 3 mLs (2.5 mg total) by nebulization every 4 (four) hours as needed for wheezing or shortness of breath. 10/17/16  Yes Arrien, Jimmy Picket, MD  ALPRAZolam Duanne Moron) 0.5 MG tablet Take 1 tablet (0.5 mg total) by mouth 3 (three) times daily as needed for anxiety. Patient taking differently: Take 0.5 mg by mouth every 6 (six) hours.  10/17/16  Yes Arrien, Jimmy Picket, MD  azithromycin (ZITHROMAX) 500 MG tablet Take 500 mg by mouth daily.   Yes [provider]  CEFTAZIDIME IV Inject 1 application into the vein every 8 (eight) hours.   Yes [provider]  desvenlafaxine (PRISTIQ) 100 MG 24 hr tablet Take 100 mg by mouth daily with breakfast.    Yes [provider]  fluticasone (FLONASE) 50 MCG/ACT nasal spray Place 2 sprays into both nostrils daily.   Yes [provider]  fluticasone furoate-vilanterol (BREO ELLIPTA) 200-25 MCG/INH AEPB Inhale 1 puff into the lungs daily after breakfast.    Yes [provider]  furosemide (LASIX) 20 MG tablet Take 20 mg by mouth daily.   Yes [provider]  gabapentin (NEURONTIN) 100 MG capsule Take 200 mg by mouth 4 (four) times daily.    Yes [provider]  guaiFENesin (ROBITUSSIN) 100 MG/5ML liquid Take 200 mg by mouth  every 4 (four) hours as needed for cough.   Yes [provider]  HYDROcodone-acetaminophen (NORCO/VICODIN) 5-325 MG tablet Take 1 tablet by mouth every 6 (six) hours as needed for moderate pain. Patient taking differently: Take 1 tablet by mouth every 6 (six) hours.  10/17/16  Yes Arrien, Jimmy Picket, MD  hydrocortisone 2.5 % cream Apply 1 application topically 3 (three) times daily. Apply to face   Yes [provider]  hydroxypropyl methylcellulose / hypromellose (ISOPTO  TEARS / GONIOVISC) 2.5 % ophthalmic solution Place 1 drop into both eyes every 12 (twelve) hours as needed for dry eyes.   Yes [provider]  insulin aspart (NOVOLOG) 100 UNIT/ML injection Inject 0-15 Units into the skin 3 (three) times daily with meals. Glucose 150 to 200 use 2 units, for 201 to 250 use 4 units, for 251 to 300 use 6 units, for 301 to 350 use 8 units, for 351 or greater use 10 units. Patient taking differently: Inject 0-10 Units into the skin 3 (three) times daily with meals. Glucose 151 to 200 use 2 units, for 201 to 250 use 4 units, for 251 to 300 use 6 units, for 301 to 350 use 8 units, for 351 or greater use 10 units. Call MD if over 400 10/17/16  Yes Arrien, Jimmy Picket, MD  ipratropium-albuterol (DUONEB) 0.5-2.5 (3) MG/3ML SOLN Take 3 mLs by nebulization 4 (four) times daily.   Yes [provider]  levocetirizine (XYZAL) 5 MG tablet Take 5 mg by mouth every evening.   Yes [provider]  magnesium hydroxide (MILK OF MAGNESIA) 400 MG/5ML suspension Take 30 mLs by mouth as needed for mild constipation.   Yes [provider]  Menthol, Topical Analgesic, (ICY HOT EX) Apply 1 patch topically daily. Leave on for 8 hours   Yes [provider]  omeprazole (PRILOSEC) 20 MG capsule Take 20 mg by mouth daily before breakfast.   Yes [provider]  oxybutynin (DITROPAN-XL) 5 MG 24 hr tablet Take 5 mg by mouth daily with breakfast.   Yes [provider]  OXYGEN Inhale 6 L/min into the lungs continuous.   Yes [provider]  predniSONE (DELTASONE) 20 MG tablet Take 2 tablets (40 mg total) by mouth daily with breakfast. Take 2 tablets daily for 2 days, then 1 tablet daily for 2 days, then half tablet daily for 2 days. Patient taking differently: Take 40 mg by mouth daily with breakfast.  10/18/16  Yes Arrien, Jimmy Picket, MD  prochlorperazine (COMPAZINE) 10 MG tablet Take 10 mg by mouth every 4 (four) hours as  needed for nausea or vomiting.   Yes [provider]  QUEtiapine (SEROQUEL) 300 MG tablet Take 300 mg by mouth at bedtime.   Yes [provider]  QUEtiapine (SEROQUEL) 50 MG tablet Take 50 mg by mouth daily with breakfast.   Yes [provider]  senna-docusate (SENOKOT-S) 8.6-50 MG tablet Take 2 tablets by mouth 2 (two) times daily. 10/24/15  Yes Florencia Reasons, MD  theophylline (THEODUR) 300 MG 12 hr tablet Take 300 mg by mouth 2 (two) times daily.   Yes [provider]  tiotropium (SPIRIVA HANDIHALER) 18 MCG inhalation capsule Place 1 capsule (18 mcg total) into inhaler and inhale daily. 03/31/15  Yes Short, Noah Delaine, MD  Vitamin D, Ergocalciferol, (DRISDOL) 50000 units CAPS capsule Take 50,000 Units by mouth every Sunday.    Yes [provider]  feeding supplement, ENSURE ENLIVE, (ENSURE ENLIVE) LIQD Take 237 mLs  by mouth 2 (two) times daily between meals. Patient not taking: Reported on 11/30/2016 10/17/16   Arrien, Jimmy Picket, MD   Allergies  Allergen Reactions  . Adhesive [Tape] Other (See Comments)    Skin peels off   . Ciprofloxacin     Unknown reaction   . Latex     Unknown reaction   . Prednisone Nausea And Vomiting    FAMILY HISTORY:  family history includes COPD in her mother; Diabetes in her unknown relative. SOCIAL HISTORY:  reports that she quit smoking about 2 years ago. Her smoking use included Cigarettes. She smoked 1.00 pack per day. She has never used smokeless tobacco. She reports that she drinks alcohol. She reports that she does not use drugs.  REVIEW OF SYSTEMS:   Constitutional: Negative for fever, chills, weight loss, malaise/fatigue and diaphoresis.  HENT: Negative for hearing loss, ear pain, nosebleeds, congestion, sore throat, neck pain, tinnitus and ear discharge.   Eyes: Negative for blurred vision, double vision, photophobia, pain, discharge and redness.  Respiratory: Negative for cough, hemoptysis, sputum  production, shortness of breath, wheezing and stridor.   Cardiovascular: Negative for chest pain, palpitations, orthopnea, claudication, leg swelling and PND.  Gastrointestinal: Negative for heartburn, nausea, vomiting, abdominal pain, diarrhea, constipation, blood in stool and melena.  Genitourinary: Negative for dysuria, urgency, frequency, hematuria and flank pain.  Musculoskeletal: Negative for myalgias, back pain, joint pain and falls.  Skin: Negative for itching and rash.  Neurological: Negative for dizziness, tingling, tremors, sensory change, speech change, focal weakness, seizures, loss of consciousness, weakness and headaches.  Endo/Heme/Allergies: Negative for environmental allergies and polydipsia. Does not bruise/bleed easily.  SUBJECTIVE:   VITAL SIGNS: Temp:  [99.4 F (37.4 C)-101.1 F (38.4 C)] 100.2 F (37.9 C) (07/20 0924) Pulse Rate:  [82-118] 88 (07/20 1000) Resp:  [13-36] 16 (07/20 1000) BP: (76-146)/(35-89) 131/64 (07/20 1000) SpO2:  [86 %-95 %] 91 % (07/20 1000) FiO2 (%):  [50 %-60 %] 60 % (07/20 0830) Weight:  [147 lb 4.3 oz (66.8 kg)] 147 lb 4.3 oz (66.8 kg) (07/20 0400)  PHYSICAL EXAMINATION: Blood pressure 131/64, pulse 88, temperature 100.2 F (37.9 C), temperature source Oral, resp. rate 16, height 4\' 11"  (1.499 m), weight 147 lb 4.3 oz (66.8 kg), SpO2 91 %. Gen:      No acute distress HEENT:  EOMI, sclera anicteric, ETT in place Neck:     No masses; no thyromegaly Lungs:    Clear to auscultation bilaterally; normal respiratory effort CV:         Regular rate and rhythm; no murmurs Abd:      + bowel sounds; soft, non-tender; no palpable masses, no distension Ext:    No edema; adequate peripheral perfusion Skin:      Warm and dry; no rash Neuro: Sedated, no focal deficits.   CBC Recent Labs     01/04/17  0301  01/05/17  0320  01/06/17  0413  WBC  7.9  10.4  6.7  HGB  8.8*  8.9*  8.6*  HCT  28.5*  29.5*  28.7*  PLT  268  301  244     Coag's No results for input(s): APTT, INR in the last 72 hours.  BMET Recent Labs     01/04/17  0301  01/05/17  0320  01/06/17  0413  NA  144  143  143  K  3.9  4.0  4.5  CL  103  103  104  CO2  33*  31  31  BUN  19  31*  42*  CREATININE  0.73  1.19*  1.22*  GLUCOSE  119*  146*  193*    Electrolytes Recent Labs     01/04/17  0301  01/05/17  0320  01/06/17  0413  CALCIUM  8.7*  8.1*  8.5*  MG   --    --   2.3  PHOS   --    --   3.8    Sepsis Markers No results for input(s): PROCALCITON, O2SATVEN in the last 72 hours.  Invalid input(s): LACTICACIDVEN  ABG Recent Labs     01/04/17  0958  01/04/17  1715  01/06/17  0402  PHART  7.397  7.333*  7.323*  PCO2ART  54.3*  63.8*  59.7*  PO2ART  57.9*  72.6*  76.0*    Liver Enzymes No results for input(s): AST, ALT, ALKPHOS, BILITOT, ALBUMIN in the last 72 hours.  Cardiac Enzymes No results for input(s): TROPONINI, PROBNP in the last 72 hours.  Glucose Recent Labs     01/05/17  1213  01/05/17  1611  01/05/17  1925  01/05/17  2312  01/06/17  0323  01/06/17  0726  GLUCAP  98  130*  163*  140*  171*  181*    Imaging Dg Abd 1 View  Result Date: 01/04/2017 CLINICAL DATA:  Orogastric tube placement EXAM: ABDOMEN - 1 VIEW COMPARISON:  10/07/2016 FINDINGS: An orogastric tube tip overlaps the distal stomach, which is somewhat low. Normal bowel gas pattern. Streaky lower lung opacities; there is contemporaneous chest x-ray. Nonobstructive bowel gas pattern. EKG leads create artifact over the abdomen chest. IMPRESSION: The orogastric tube tip overlaps the distal stomach. Electronically Signed   By: Monte Fantasia M.D.   On: 01/04/2017 17:09   Dg Chest Port 1 View  Result Date: 01/06/2017 CLINICAL DATA:  Acute respiratory failure EXAM: PORTABLE CHEST 1 VIEW COMPARISON:  01/05/2017 FINDINGS: Cardiac shadow is stable. Endotracheal tube and nasogastric catheter are again seen and stable. Right PICC line is again noted  in satisfactory position. Mild bibasilar atelectatic changes are again seen. Small right pleural effusion is noted. No bony abnormality is noted. IMPRESSION: Overall stable appearance of the chest when compared with the prior exam. Electronically Signed   By: Inez Catalina M.D.   On: 01/06/2017 07:09   Portable Chest Xray  Result Date: 01/05/2017 CLINICAL DATA:  Respiratory failure. EXAM: PORTABLE CHEST 1 VIEW COMPARISON:  01/04/2017 . FINDINGS: Endotracheal tube, NG tube, right PICC line in stable position. Heart size normal. Low lung volumes . Bibasilar pulmonary infiltrates and bilateral pleural effusions again noted. No pneumothorax. IMPRESSION: 1. Lines and tubes in stable position. 2. Low lung volumes with mild bibasilar atelectasis and infiltrates. Small bilateral pleural effusions. Chest is stable from prior exam . Electronically Signed   By: Marcello Moores  Register   On: 01/05/2017 07:29   Portable Chest Xray  Result Date: 01/04/2017 CLINICAL DATA:  Acute respiratory failure. Endotracheal tube placement EXAM: PORTABLE CHEST 1 VIEW COMPARISON:  01/04/2017 FINDINGS: Endotracheal tube in good position. Central venous catheter tip in the SVC unchanged. NG tube has been placed with tip in the stomach Progressive atelectasis in the lung bases bilaterally right greater than left. Negative for heart failure or edema. Right pleural effusion. IMPRESSION: Endotracheal tube in good position Progression of bibasilar atelectasis Electronically Signed   By: Franchot Gallo M.D.   On: 01/04/2017 16:53   Dg Chest Port 1 View  Result Date: 01/04/2017  CLINICAL DATA:  Pneumonia, shortness of breath EXAM: PORTABLE CHEST 1 VIEW COMPARISON:  01/03/2017, 09/29/2016 FINDINGS: Bibasilar airspace disease similar in appearance of multiple prior examinations. Possible trace right pleural effusion. No pneumothorax. Stable cardiomediastinal silhouette. Right sided PICC line with the tip projecting over the SVC. IMPRESSION: Persistent  bibasilar airspace disease which may reflect atelectasis versus scarring versus pneumonia. Electronically Signed   By: Kathreen Devoid   On: 01/04/2017 15:01  Personally reviewed bibasal infiltrates new   Culture data Sputum 7/17: MRSA MRSA PCR 7/17: negative BCX2 7/17>>>  abx Vanc 7/16>>> Cefepime 7/16>>>  ASSESSMENT / PLAN: Acute on chronic Hypoxic and hypercarbic respiratory failure in setting of HCAP and decompensated diastolic HF.  MRSA pneumonoia Plan Continue vent support PSV trials as tolerated Cont empiric cefepime and vancomycin Cont scheduled BDs Continue solumedrol. Reduce to 40 mg q12 Follow CXR  Acute on chronic diastolic HF  Accelerated Hypertension  Tachycardia  Plan Continue hydralazine Continue lasix Tele monitoring Hold theophylline due to tachycardia  Acute metabolic encephalopathy.  Has jerking movement in extremities  Plan Supportive care Check EEG, CT scan Holidng effexor Cont seroquel  Fentanyl gtt for sedation  Normocytic anemia  Plan Trend cbc  DVT prophylaxis: LMWH SUP: ppi Diet: npo exccept meds Activity: bedrest  Disposition : ICU  Goals of care- son [name] updated at bedside.  The patient is critically ill with multiple organ system failure and requires high complexity decision making for assessment and support, frequent evaluation and titration of therapies, advanced monitoring, review of radiographic studies and interpretation of complex data.   Critical Care Time devoted to patient care services, exclusive of separately billable procedures, described in this note is 35 minutes.   Marshell Garfinkel MD [facility] Pager [phone] If no answer or after 3pm call: [phone] 01/06/2017, 10:36 AM

## 2017-01-06 NOTE — Progress Notes (Signed)
EEG Completed; Results Pending  

## 2017-01-06 NOTE — Progress Notes (Signed)
Nutrition Follow-up  DOCUMENTATION CODES:   Not applicable  INTERVENTION:  - Will increase to Vital 1.2 @ 50 mL/hr which will provide 1440 kcal (97% re-estimated kcal need), 90 grams of protein, and 973 mL free water.  - Continue PEPuP protocol.  NUTRITION DIAGNOSIS:   Inadequate oral intake related to inability to eat as evidenced by NPO status. -ongoing  GOAL:   Patient will meet greater than or equal to 90% of their needs -met with TF regimen  MONITOR:   Vent status, TF tolerance, Weight trends, Labs, I & O's  ASSESSMENT:   66 year old woman PMH COPD, chronic respiratory failure on home oxygen, diabetes, dementia presented to the emergency department with shortness of breath, hypoxia. Placed on BiPAP, treated with antibiotics and referred for admission for respiratory acidosis, pneumonia, COPD exacerbation, acute respiratory failure.  7/20 Pt remains intubated with OGT in place and is currently receiving Vital AF 1.2 @ 45 mL/hr which is providing 1296 kcal, 81 grams of protein, and 876 mL free water. Weight +1.2 kg from yesterday so yesterday's weight (65.6 kg) used in re-estimating needs. Will adjust TF as outlined above based on increase in kcal need. PCCM has not yet rounded on pt today.   Patient is currently intubated on ventilator support MV: 6.8 L/min Temp (24hrs), Avg:100.2 F (37.9 C), Min:99.4 F (37.4 C), Max:101.1 F (38.4 C) BP: 114/58 and MAP: 77  Medications reviewed; 20 mg IV Lasix BID, sliding scale Novolog, 40 mg Solu-medrol BID, 40 mg Protonix per OGT/day, 1 packet Miralax per OGT BID, 1 tablet Senokot per OGT BID. Labs reviewed; CBGs: 171 and 181 mg/dL, BUN: 42 mg/dL, creatinine: 1.22 mg/dL, Ca: 8.5 mg/dL, GFR: 46 mL/min  Drip: Fentanyl @ 125 mcg/hr.    7/19 - Pt previously on BiPAP and required intubation which was done yesterday at ~1600 and OGT placed at that time.  - Talked with Dr. Vaughan Browner this AM who reports plan to trial weaning and if unable  to successfully wean toward extubation, plan to start TF via OGT.  - Weight has been stable so current weight (65.6 kg) used in re-estimating nutrition needs. - Pt awake and tracks RD around the room.  - RN reports that pt looks better today than yesterday.  - RN spoke with RT and reports pt unable to wean today d/t FiO2 at this time.   Patient is currently intubated on ventilator support MV: 6.5 L/min Temp (24hrs), Avg:98.9 F (37.2 C), Min:98.1 F (36.7 C), Max:99.5 F (37.5 C) Propofol: none BP: 112/65 and MAP: 82 Drip: Fentanyl @ 100 mcg/hr.    7/17 - Weight +5.2 kg/12 lbs since 11/30/16 so weight from that time (60.8 kg) used in estimating nutrition needs. - Pt has been NPO since admission.  - No family/visitors present at this time.  - Pt seems to have some degree of confusion/hallucination. - Pt states that she has abdominal pressure, as if she needs to have a BM.  - Physical assessment shows no muscle and no fat wasting; mild to moderate edema to BLE.    Diet Order:  Diet NPO time specified  Skin:  Reviewed, no issues  Last BM:  7/18  Height:   Ht Readings from Last 1 Encounters:  01/03/17 '4\' 11"'$  (1.499 m)    Weight:   Wt Readings from Last 1 Encounters:  01/06/17 147 lb 4.3 oz (66.8 kg)    Ideal Body Weight:  42.91 kg  BMI:  Body mass index is 29.74 kg/m.  Estimated  Nutritional Needs:   Kcal:  1483  Protein:  80-100 grams (~1.2-1.5 grams/kg)  Fluid:  1.5 L/day  EDUCATION NEEDS:   No education needs identified at this time    Jarome Matin, MS, RD, LDN, CNSC Inpatient Clinical Dietitian Pager # [phone] After hours/weekend pager # [phone]

## 2017-01-06 NOTE — Procedures (Signed)
History: 66 yo F with altered mental status  Sedation: fentanyl  Technique: This is a 21 channel routine scalp EEG performed at the bedside with bipolar and monopolar montages arranged in accordance to the international 10/20 system of electrode placement. One channel was dedicated to EKG recording.    Background: The background is poorly organized with irregular diffuse delta and theta activities. There are frequent periodic 1 - 2 Hz discharges with triphasic morphology with generalized distribution with shifting predominancet.   Photic stimulation: Physiologic driving is not performed  EEG Abnormalities: 1) triphasic waves 2) Generalized irregular slow activity 3) Absent PDR  Clinical Interpretation: This EEG is consistent with a moderate to severe generalized non-specific cerebral dysfunction(encephalopathy). Though non-specific, triphasic waves can suggest metabolic cause of encephalopathy. There was no seizure or seizure predisposition recorded on this study.   Roland Rack, MD [facility] [phone]  If 7pm- 7am, please page neurology on call as listed in [place].

## 2017-01-07 ENCOUNTER — Inpatient Hospital Stay (HOSPITAL_COMMUNITY): Payer: Medicare Other

## 2017-01-07 DIAGNOSIS — J9611 Chronic respiratory failure with hypoxia: Secondary | ICD-10-CM

## 2017-01-07 DIAGNOSIS — D649 Anemia, unspecified: Secondary | ICD-10-CM

## 2017-01-07 DIAGNOSIS — E119 Type 2 diabetes mellitus without complications: Secondary | ICD-10-CM

## 2017-01-07 DIAGNOSIS — J181 Lobar pneumonia, unspecified organism: Secondary | ICD-10-CM

## 2017-01-07 DIAGNOSIS — E872 Acidosis: Secondary | ICD-10-CM

## 2017-01-07 LAB — BASIC METABOLIC PANEL
ANION GAP: 8 (ref 5–15)
BUN: 44 mg/dL — ABNORMAL HIGH (ref 6–20)
CHLORIDE: 103 mmol/L (ref 101–111)
CO2: 35 mmol/L — AB (ref 22–32)
CREATININE: 1.09 mg/dL — AB (ref 0.44–1.00)
Calcium: 8.5 mg/dL — ABNORMAL LOW (ref 8.9–10.3)
GFR calc non Af Amer: 52 mL/min — ABNORMAL LOW (ref >60)
GLUCOSE: 223 mg/dL — AB (ref 65–99)
Potassium: 4.5 mmol/L (ref 3.5–5.1)
Sodium: 146 mmol/L — ABNORMAL HIGH (ref 135–145)

## 2017-01-07 LAB — GLUCOSE, CAPILLARY
GLUCOSE-CAPILLARY: 149 mg/dL — AB (ref 65–99)
GLUCOSE-CAPILLARY: 174 mg/dL — AB (ref 65–99)
GLUCOSE-CAPILLARY: 190 mg/dL — AB (ref 65–99)
Glucose-Capillary: 199 mg/dL — ABNORMAL HIGH (ref 65–99)
Glucose-Capillary: 190 mg/dL — ABNORMAL HIGH (ref 65–99)
Glucose-Capillary: 188 mg/dL — ABNORMAL HIGH (ref 65–99)

## 2017-01-07 LAB — CBC
HEMATOCRIT: 28.8 % — AB (ref 36.0–46.0)
HEMOGLOBIN: 8.8 g/dL — AB (ref 12.0–15.0)
MCH: 28.2 pg (ref 26.0–34.0)
MCHC: 30.6 g/dL (ref 30.0–36.0)
MCV: 92.3 fL (ref 78.0–100.0)
Platelets: 235 10*3/uL (ref 150–400)
RBC: 3.12 MIL/uL — ABNORMAL LOW (ref 3.87–5.11)
RDW: 16.8 % — ABNORMAL HIGH (ref 11.5–15.5)
WBC: 8.6 10*3/uL (ref 4.0–10.5)

## 2017-01-07 LAB — PHOSPHORUS: PHOSPHORUS: 3.6 mg/dL (ref 2.5–4.6)

## 2017-01-07 LAB — MAGNESIUM: Magnesium: 2.1 mg/dL (ref 1.7–2.4)

## 2017-01-07 MED ORDER — VENLAFAXINE HCL 50 MG PO TABS
50.0000 mg | ORAL_TABLET | Freq: Two times a day (BID) | ORAL | Status: DC
  Administered 2017-01-07 – 2017-01-17 (×20): 50 mg via ORAL
  Filled 2017-01-07 (×23): qty 1

## 2017-01-07 MED ORDER — CLONAZEPAM 0.1 MG/ML ORAL SUSPENSION
1.0000 mg | Freq: Two times a day (BID) | ORAL | Status: DC
  Filled 2017-01-07: qty 10

## 2017-01-07 MED ORDER — ARFORMOTEROL TARTRATE 15 MCG/2ML IN NEBU
15.0000 ug | INHALATION_SOLUTION | Freq: Two times a day (BID) | RESPIRATORY_TRACT | Status: DC
  Administered 2017-01-07 – 2017-01-09 (×5): 15 ug via RESPIRATORY_TRACT
  Filled 2017-01-07 (×4): qty 2

## 2017-01-07 MED ORDER — MUPIROCIN 2 % EX OINT
1.0000 "application " | TOPICAL_OINTMENT | Freq: Two times a day (BID) | CUTANEOUS | Status: AC
  Administered 2017-01-07 – 2017-01-11 (×10): 1 via NASAL
  Filled 2017-01-07: qty 22

## 2017-01-07 MED ORDER — MUPIROCIN 2 % EX OINT
TOPICAL_OINTMENT | CUTANEOUS | Status: AC
  Administered 2017-01-07: 1 via NASAL
  Filled 2017-01-07: qty 22

## 2017-01-07 MED ORDER — CHLORHEXIDINE GLUCONATE CLOTH 2 % EX PADS: 6.0000 | MEDICATED_PAD | Freq: Every day | CUTANEOUS | Status: DC

## 2017-01-07 MED ORDER — CLONAZEPAM 1 MG PO TABS
1.0000 mg | ORAL_TABLET | Freq: Two times a day (BID) | ORAL | Status: DC
  Administered 2017-01-07 – 2017-01-08 (×4): 1 mg
  Filled 2017-01-07 (×4): qty 1

## 2017-01-07 MED ORDER — BUDESONIDE 0.5 MG/2ML IN SUSP
0.5000 mg | Freq: Two times a day (BID) | RESPIRATORY_TRACT | Status: DC
  Administered 2017-01-07 – 2017-01-17 (×21): 0.5 mg via RESPIRATORY_TRACT
  Filled 2017-01-07 (×21): qty 2

## 2017-01-07 NOTE — Progress Notes (Signed)
Name: Traci Thomas MRN: [id number] DOB: [birth year]    ADMISSION DATE:  01/03/2017 CONSULTATION DATE:  7/18  REFERRING MD :  Doyle Askew   CHIEF COMPLAINT:  Acute on chronic respiratory failure and BIPAP   BRIEF PATIENT DESCRIPTION:  66 year old female w/ acute on chronic hypoxic and hypercarbic respiratory failure in setting of HCAP +/- acute decompensated diastolic HF. PCCM asked to see on 7/18 for on-going hypoxia.   SIGNIFICANT EVENTS  7/17 admit for HCAP, CHF 7/18 intubated  STUDIES:  7/20 EEG> no seizure, likely metabolic encephalopathy 2/20 Head CT > No acute abnormality, chronic microvascular change, atherosclerosis, chronic bilateral mastoid effusions   SUBJECTIVE:  Remains on ventilator Continued intermittent shaking EEG negative for seizure  VITAL SIGNS: Temp:  [99.5 F (37.5 C)-100.9 F (38.3 C)] 100.9 F (38.3 C) (07/21 0800) Pulse Rate:  [80-102] 88 (07/21 0800) Resp:  [14-22] 16 (07/21 0800) BP: (108-162)/(55-110) 134/71 (07/21 0800) SpO2:  [87 %-97 %] 96 % (07/21 0856) FiO2 (%):  [60 %-100 %] 60 % (07/21 0850) Weight:  [67.1 kg (147 lb 14.9 oz)] 67.1 kg (147 lb 14.9 oz) (07/21 0500)  PHYSICAL EXAMINATION: Vitals:   01/07/17 0600 01/07/17 0615 01/07/17 0800 01/07/17 0856  BP: (!) 110/57 119/61 134/71   Pulse: 83 80 88   Resp: 16 19 16    Temp:   (!) 100.9 F (38.3 C)   TempSrc:   Axillary   SpO2: 94% 94% 95% 96%  Weight:      Height:       General:  In bed on vent HENT: NCAT ETT in place PULM: Poor air movement B, vent supported breathing CV: RRR, no mgr GI: BS+, soft, nontender MSK: normal bulk and tone Neuro: intermittent myoclonic jerking, follows commands and will awaken easily to voice      CBC Recent Labs     01/05/17  0320  01/06/17  0413  01/07/17  0514  WBC  10.4  6.7  8.6  HGB  8.9*  8.6*  8.8*  HCT  29.5*  28.7*  28.8*  PLT  301  244  235    Coag's No results for input(s): APTT, INR in the last 72  hours.  BMET Recent Labs     01/05/17  0320  01/06/17  0413  01/07/17  0514  NA  143  143  146*  K  4.0  4.5  4.5  CL  103  104  103  CO2  31  31  35*  BUN  31*  42*  44*  CREATININE  1.19*  1.22*  1.09*  GLUCOSE  146*  193*  223*    Electrolytes Recent Labs     01/05/17  0320  01/06/17  0413  01/07/17  0514  CALCIUM  8.1*  8.5*  8.5*  MG   --   2.3  2.1  PHOS   --   3.8  3.6    Sepsis Markers No results for input(s): PROCALCITON, O2SATVEN in the last 72 hours.  Invalid input(s): LACTICACIDVEN  ABG Recent Labs     01/04/17  1715  01/06/17  0402  PHART  7.333*  7.323*  PCO2ART  63.8*  59.7*  PO2ART  72.6*  76.0*    Liver Enzymes No results for input(s): AST, ALT, ALKPHOS, BILITOT, ALBUMIN in the last 72 hours.  Cardiac Enzymes No results for input(s): TROPONINI, PROBNP in the last 72 hours.  Glucose Recent Labs     01/06/17  1135  01/06/17  1620  01/06/17  1932  01/06/17  2325  01/07/17  0419  01/07/17  0801  GLUCAP  165*  124*  165*  141*  199*  190*    Imaging Ct Head Wo Contrast  Result Date: 01/06/2017 CLINICAL DATA:  Agitation and confusion last night in patient admitted 01/03/2017 for respiratory failure. EXAM: CT HEAD WITHOUT CONTRAST TECHNIQUE: Contiguous axial images were obtained from the base of the skull through the vertex without intravenous contrast. COMPARISON:  Head CT scan 10/21/2015. FINDINGS: Brain: There is advanced for age cortical atrophy. Chronic microvascular ischemic change is identified. No evidence of acute abnormality including hemorrhage, infarct, mass lesion, mass effect, midline shift or abnormal extra-axial fluid collection. No hydrocephalus or pneumocephalus. Vascular: Carotid atherosclerosis noted. Skull: Intact. Sinuses/Orbits: Mastoid effusions are unchanged since the prior head CT. Other: None. IMPRESSION: No acute abnormality. Chronic microvascular ischemic change and advanced for age cortical atrophy.  Atherosclerosis. Chronic bilateral mastoid effusions. Electronically Signed   By: Inge Rise M.D.   On: 01/06/2017 14:53   Dg Chest Port 1 View  Result Date: 01/07/2017 CLINICAL DATA:  Acute respiratory failure EXAM: PORTABLE CHEST 1 VIEW COMPARISON:  01/06/2017 FINDINGS: Endotracheal tube remains low at the level of the carina, recommend withdrawal 4 cm. This is unchanged. Gastric tube in the stomach.  Right arm PICC tip in the SVC. Improvement in bibasilar airspace disease with improved lung volume. No heart failure or effusion IMPRESSION: Endotracheal tube remains low at the carina. Recommend withdrawal 4 cm. Improved aeration in the lung bases These results will be called to the ordering clinician or representative by the Radiologist Assistant, and communication documented in the PACS or zVision Dashboard. Electronically Signed   By: Franchot Gallo M.D.   On: 01/07/2017 07:15   Dg Chest Port 1 View  Result Date: 01/06/2017 CLINICAL DATA:  Acute respiratory failure EXAM: PORTABLE CHEST 1 VIEW COMPARISON:  01/05/2017 FINDINGS: Cardiac shadow is stable. Endotracheal tube and nasogastric catheter are again seen and stable. Right PICC line is again noted in satisfactory position. Mild bibasilar atelectatic changes are again seen. Small right pleural effusion is noted. No bony abnormality is noted. IMPRESSION: Overall stable appearance of the chest when compared with the prior exam. Electronically Signed   By: Inez Catalina M.D.   On: 01/06/2017 07:09   CXR images reviewed +/- infiltrates bases, ETT about 1 cm above carina   Culture data Sputum 7/17: MRSA Resp culture 7/18 Staph MRSA PCR 7/17: negative BCX2 7/17>>>  Antibiotics Vanc 7/16>>> Cefepime 7/16>>> 7/21  ASSESSMENT / PLAN: Acute on chronic respiratory failure with hypoxemia Acute COPD exacerbation HCAP MRSA Acute exacerbation of diastolic heart failure Plan Continue full ventilator support VAP prevention Daily  WUA/SBT Start brovana/pulmicort Pull back ETT 1 cm Continue solumedrol Continue Vanc, stop cefepime Keep net even to slightly negative Check theophylline level, hold theophylline  Acute on chronic diastolic HF  Plan Continue hydralazine and lasix Tele Monitor I/O strict Monitor hemodynamics  Acute metabolic encephalopathy> hypercarbia, medication related Myoclonic jerking> suspect related to medication withdrawal (benzo, effexor) and hypercarbia Plan Restart effexor Continue sedation per PAD protocol (precedex, fentanyl) titrated to RASS -1 Start clonazepem   Anemia without bleeding Plan Monitor for bleeding  Feeding: tube feeding Analgesia: fentanyl gtt Sedation: precedex Thromboprophylaxis: lovenox HOB >30 degrees Ulcer prophylaxis: PPE Glucose control: continue SSI     The patient is critically ill with multiple organ system failure and requires high complexity decision making for assessment and  support, frequent evaluation and titration of therapies, advanced monitoring, review of radiographic studies and interpretation of complex data.   Critical Care Time devoted to patient care services, exclusive of separately billable procedures, described in this note is 32 minutes.   Roselie Awkward, MD [place] [facility] Pager: [phone] Cell: [phone] After 3pm or if no response, call [phone]  01/07/2017, 10:04 AM

## 2017-01-08 ENCOUNTER — Inpatient Hospital Stay (HOSPITAL_COMMUNITY): Payer: Medicare Other

## 2017-01-08 LAB — CBC WITH DIFFERENTIAL/PLATELET
BASOS ABS: 0 10*3/uL (ref 0.0–0.1)
Basophils Relative: 0 %
Eosinophils Absolute: 0 10*3/uL (ref 0.0–0.7)
Eosinophils Relative: 0 %
HEMATOCRIT: 29.1 % — AB (ref 36.0–46.0)
Hemoglobin: 8.8 g/dL — ABNORMAL LOW (ref 12.0–15.0)
LYMPHS ABS: 0.9 10*3/uL (ref 0.7–4.0)
LYMPHS PCT: 11 %
MCH: 28.2 pg (ref 26.0–34.0)
MCHC: 30.2 g/dL (ref 30.0–36.0)
MCV: 93.3 fL (ref 78.0–100.0)
MONO ABS: 0.4 10*3/uL (ref 0.1–1.0)
Monocytes Relative: 5 %
NEUTROS ABS: 7 10*3/uL (ref 1.7–7.7)
Neutrophils Relative %: 84 %
Platelets: 206 10*3/uL (ref 150–400)
RBC: 3.12 MIL/uL — AB (ref 3.87–5.11)
RDW: 16.9 % — AB (ref 11.5–15.5)
WBC: 8.4 10*3/uL (ref 4.0–10.5)

## 2017-01-08 LAB — GLUCOSE, CAPILLARY
GLUCOSE-CAPILLARY: 259 mg/dL — AB (ref 65–99)
GLUCOSE-CAPILLARY: 143 mg/dL — AB (ref 65–99)
Glucose-Capillary: 300 mg/dL — ABNORMAL HIGH (ref 65–99)
Glucose-Capillary: 198 mg/dL — ABNORMAL HIGH (ref 65–99)
Glucose-Capillary: 221 mg/dL — ABNORMAL HIGH (ref 65–99)

## 2017-01-08 LAB — CULTURE, RESPIRATORY

## 2017-01-08 LAB — BASIC METABOLIC PANEL
ANION GAP: 8 (ref 5–15)
BUN: 39 mg/dL — ABNORMAL HIGH (ref 6–20)
CO2: 37 mmol/L — AB (ref 22–32)
Calcium: 8.6 mg/dL — ABNORMAL LOW (ref 8.9–10.3)
Chloride: 102 mmol/L (ref 101–111)
Creatinine, Ser: 1.08 mg/dL — ABNORMAL HIGH (ref 0.44–1.00)
GFR calc Af Amer: >60 mL/min (ref >60)
GFR calc non Af Amer: 53 mL/min — ABNORMAL LOW (ref >60)
GLUCOSE: 252 mg/dL — AB (ref 65–99)
POTASSIUM: 4.4 mmol/L (ref 3.5–5.1)
Sodium: 147 mmol/L — ABNORMAL HIGH (ref 135–145)

## 2017-01-08 LAB — CULTURE, BLOOD (ROUTINE X 2)
Culture: NO GROWTH
Culture: NO GROWTH

## 2017-01-08 LAB — CULTURE, RESPIRATORY W GRAM STAIN

## 2017-01-08 LAB — VANCOMYCIN, TROUGH: Vancomycin Tr: 17 ug/mL (ref 15–20)

## 2017-01-08 MED ORDER — DEXTROSE 5 % IV SOLN
2.0000 g | Freq: Two times a day (BID) | INTRAVENOUS | Status: DC
  Administered 2017-01-08 – 2017-01-13 (×11): 2 g via INTRAVENOUS
  Filled 2017-01-08 (×11): qty 2

## 2017-01-08 MED ORDER — SODIUM CHLORIDE 0.9 % IV BOLUS (SEPSIS)
1000.0000 mL | Freq: Once | INTRAVENOUS | Status: AC
  Administered 2017-01-08: 1000 mL via INTRAVENOUS

## 2017-01-08 MED ORDER — BISACODYL 10 MG RE SUPP
10.0000 mg | Freq: Every day | RECTAL | Status: DC | PRN
Start: 2017-01-08 — End: 2017-01-09
  Administered 2017-01-09: 10 mg via RECTAL

## 2017-01-08 NOTE — Progress Notes (Signed)
eLink Physician-Brief Progress Note Patient Name: ZULY BELKIN DOB: Aug 20, 1950 MRN: [id number]   Date of Service  01/08/2017  HPI/Events of Note  Hypotension - BP = 81/79 with MAP = 55.   eICU Interventions  Will order: 1. Monitor CVP. 2. Bolus with 0.9 NaCl 1 liter IV over 1 hour now.  3. Hold Lasix until rounding team can re-evaluate patient is AM.      Intervention Category Major Interventions: Hypotension - evaluation and management  Sommer,Steven Eugene 01/08/2017, 11:14 PM

## 2017-01-08 NOTE — Progress Notes (Signed)
eLink Physician-Brief Progress Note Patient Name: Traci Thomas DOB: [birth year] MRN: [id number]   Date of Service  01/08/2017  HPI/Events of Note  Hematuria and bloody et  Lab Results  Component Value Date   PLT 206 01/08/2017   PLT 235 01/07/2017   PLT 244 01/06/2017       Lab Results  Component Value Date   HGB 8.8 (L) 01/08/2017   HGB 8.8 (L) 01/07/2017   HGB 8.6 (L) 01/06/2017        eICU Interventions  D/c lovenox To have end of life discussions 7/23 so no need for urology consult this evening      Intervention Category Major Interventions: Hemorrhage - evaluation and management  Christinia Gully 01/08/2017, 5:05 PM

## 2017-01-08 NOTE — Progress Notes (Signed)
Name: Traci Thomas MRN: [id number] DOB: Sep 17, 1950    ADMISSION DATE:  01/03/2017 CONSULTATION DATE:  7/18  REFERRING MD :  Doyle Askew   CHIEF COMPLAINT:  Acute on chronic respiratory failure and BIPAP   BRIEF PATIENT DESCRIPTION:  66 year old female w/ acute on chronic hypoxic and hypercarbic respiratory failure in setting of HCAP +/- acute decompensated diastolic HF. PCCM asked to see on 7/18 for on-going hypoxia.   SIGNIFICANT EVENTS  7/17 admit for HCAP, CHF 7/18 intubated  STUDIES:  7/20 EEG> no seizure, likely metabolic encephalopathy 9/02 Head CT > No acute abnormality, chronic microvascular change, atherosclerosis, chronic bilateral mastoid effusions   SUBJECTIVE:  Remains on mechanical ventilation Fever Still having shaking episodes intermittently  VITAL SIGNS: Temp:  [99.4 F (37.4 C)-102.2 F (39 C)] 102.2 F (39 C) (07/22 0800) Pulse Rate:  [52-103] 86 (07/22 1100) Resp:  [14-20] 14 (07/22 1100) BP: (77-133)/(27-84) 126/53 (07/22 1100) SpO2:  [87 %-97 %] 95 % (07/22 1100) FiO2 (%):  [60 %-70 %] 60 % (07/22 0800) Weight:  [64.8 kg (142 lb 13.7 oz)] 64.8 kg (142 lb 13.7 oz) (07/22 0431)  PHYSICAL EXAMINATION: Vitals:   01/08/17 0800 01/08/17 0900 01/08/17 1000 01/08/17 1100  BP: 118/60 (!) 96/56 (!) 108/55 (!) 126/53  Pulse: 86 67 88 86  Resp: 14 14 14 14   Temp: (!) 102.2 F (39 C)     TempSrc: Oral     SpO2: 95% 93% 95% 95%  Weight:      Height:        General:  In bed on vent HENT: NCAT ETT in place PULM: poor air movement B, vent supported breathing CV: RRR, no mgr GI: BS+, soft, nontender MSK: normal bulk and tone Neuro: intermittent whole body convulsions when awake, but will follow commands, convulsions will go away when she is asleep   CBC Recent Labs     01/06/17  0413  01/07/17  0514  01/08/17  0442  WBC  6.7  8.6  8.4  HGB  8.6*  8.8*  8.8*  HCT  28.7*  28.8*  29.1*  PLT  244  235  206    Coag's No results for input(s):  APTT, INR in the last 72 hours.  BMET Recent Labs     01/06/17  0413  01/07/17  0514  01/08/17  0442  NA  143  146*  147*  K  4.5  4.5  4.4  CL  104  103  102  CO2  31  35*  37*  BUN  42*  44*  39*  CREATININE  1.22*  1.09*  1.08*  GLUCOSE  193*  223*  252*    Electrolytes Recent Labs     01/06/17  0413  01/07/17  0514  01/08/17  0442  CALCIUM  8.5*  8.5*  8.6*  MG  2.3  2.1   --   PHOS  3.8  3.6   --     Sepsis Markers No results for input(s): PROCALCITON, O2SATVEN in the last 72 hours.  Invalid input(s): LACTICACIDVEN  ABG Recent Labs     01/06/17  0402  PHART  7.323*  PCO2ART  59.7*  PO2ART  76.0*    Liver Enzymes No results for input(s): AST, ALT, ALKPHOS, BILITOT, ALBUMIN in the last 72 hours.  Cardiac Enzymes No results for input(s): TROPONINI, PROBNP in the last 72 hours.  Glucose Recent Labs     01/07/17  1624  01/07/17  1935  01/07/17  2343  01/08/17  0407  01/08/17  0410  01/08/17  0832  GLUCAP  190*  188*  174*  300*  259*  143*    Imaging Ct Head Wo Contrast  Result Date: 01/06/2017 CLINICAL DATA:  Agitation and confusion last night in patient admitted 01/03/2017 for respiratory failure. EXAM: CT HEAD WITHOUT CONTRAST TECHNIQUE: Contiguous axial images were obtained from the base of the skull through the vertex without intravenous contrast. COMPARISON:  Head CT scan 10/21/2015. FINDINGS: Brain: There is advanced for age cortical atrophy. Chronic microvascular ischemic change is identified. No evidence of acute abnormality including hemorrhage, infarct, mass lesion, mass effect, midline shift or abnormal extra-axial fluid collection. No hydrocephalus or pneumocephalus. Vascular: Carotid atherosclerosis noted. Skull: Intact. Sinuses/Orbits: Mastoid effusions are unchanged since the prior head CT. Other: None. IMPRESSION: No acute abnormality. Chronic microvascular ischemic change and advanced for age cortical atrophy. Atherosclerosis. Chronic  bilateral mastoid effusions. Electronically Signed   By: Inge Rise M.D.   On: 01/06/2017 14:53   Dg Chest Port 1 View  Result Date: 01/08/2017 CLINICAL DATA:  Acute respiratory failure with hypoxemia EXAM: PORTABLE CHEST 1 VIEW COMPARISON:  01/07/2017 FINDINGS: Endotracheal tube in good position. NG tube enters stomach. Right arm PICC tip in the SVC unchanged. Progression of mild bibasilar airspace disease. Upper lobes clear. Negative for edema or effusion IMPRESSION: Progression of bibasilar atelectasis/ infiltrate. Electronically Signed   By: Franchot Gallo M.D.   On: 01/08/2017 07:06   Dg Chest Port 1 View  Result Date: 01/07/2017 CLINICAL DATA:  Acute respiratory failure EXAM: PORTABLE CHEST 1 VIEW COMPARISON:  01/06/2017 FINDINGS: Endotracheal tube remains low at the level of the carina, recommend withdrawal 4 cm. This is unchanged. Gastric tube in the stomach.  Right arm PICC tip in the SVC. Improvement in bibasilar airspace disease with improved lung volume. No heart failure or effusion IMPRESSION: Endotracheal tube remains low at the carina. Recommend withdrawal 4 cm. Improved aeration in the lung bases These results will be called to the ordering clinician or representative by the Radiologist Assistant, and communication documented in the PACS or zVision Dashboard. Electronically Signed   By: Franchot Gallo M.D.   On: 01/07/2017 07:15   CXR with emphysema bilaterally, ETT in place, bibasilar airspace disease  Culture data Sputum 7/17: MRSA, stenotrophamonas Resp culture 7/18 Staph MRSA PCR 7/17: negative BCX2 7/17>>>  Antibiotics Vanc 7/16>>> Cefepime 7/16>>> 7/21 Ceftaz 7/22>   ASSESSMENT / PLAN: Acute on chronic respiratory failure with hypoxemia Acute COPD exacerbation HCAP MRSA stenotrophomonas HCAP Acute exacerbation of diastolic heart failure Plan Continue solumedrol Full mechanical vent support VAP prevention Daily WUA/SBT Continue brovana/pulmicort Continue  vanc Add back ceftaz ID consult  Acute on chronic diastolic HF  Plan Continue hydralazine/lasix Tele Strict I/O Monitor hemodynamics  Acute metabolic encephalopathy> hypercarbia, medication related Myoclonic jerking> suspect related to medication withdrawal (benzo, effexor) and hypercarbia; eeg without seizure activity Plan Continue clonazepam and effexor   Anemia without bleeding Plan Monitor for bleeding  Feeding: tube feeding Analgesia: fentanyl gtt Sedation: precedex Thromboprophylaxis: lovenox HOB >30 degrees Ulcer prophylaxis: PPE Glucose control: continue SSI  I called her son [name] to discuss the situation today, he voiced understanding.  Her sister told me today that she has been DNR in the past.  Given the severity of the situation and her severe underlying lung disease I am concerned that she will not survive this illness.  We have planned a family conversation for 7/23  at [place]   The patient is critically ill with multiple organ system failure and requires high complexity decision making for assessment and support, frequent evaluation and titration of therapies, advanced monitoring, review of radiographic studies and interpretation of complex data.   CCM time 33 minutes   Roselie Awkward, MD [place] [facility] Pager: [phone] Cell: [phone] After 3pm or if no response, call [phone]  01/08/2017, 11:48 AM

## 2017-01-08 NOTE — Progress Notes (Signed)
Brief ID note.  Resistance noted on Serratia to levaquin and TMP/SMX.  Ceftazidime a good option though ultimately will need sensitivities on that, amp/sulbactam, tetracycline to see if there is any other resistance.  Continue ceftazidime for now.

## 2017-01-08 NOTE — Progress Notes (Addendum)
Pharmacy Antibiotic Note  Traci Thomas is a 66 y.o. female admitted on 01/03/2017 with pneumonia.  Pt has known hx of COPD, came from facility with fever and respiratory distress.  Pharmacy has been consulted for Cefepime and Vancomycin dosing.  Today, 01/08/2017: - Tm 100.9,  WBC WNL, SCr stopped rising, CrCl 81ml/min. - Vanc trough 71mcg/ml (Goal 15-52mcg/ml) -  Stenotrophomonas maltophilia growing in trach aspirate. Resist to Bactrim and LVQ. Additional sensitivies require a send out.    Plan:  Continue Vancomycin 1g IV q24h.  Recheck Vanc trough when appropriate.  Follow up renal fxn, culture results, and clinical course.  I spoke with Dr. [place] Bells. He is planning for ID consult and Ceftaz in the interim.   Start Ceftazidime 2g IV q12h.     Height: 4\' 11"  (149.9 cm) Weight: 142 lb 13.7 oz (64.8 kg) IBW/kg (Calculated) : 43.2  Temp (24hrs), Avg:100.9 F (38.3 C), Min:99.4 F (37.4 C), Max:102.2 F (39 C)   Recent Labs Lab 01/03/17 0450 01/04/17 0301 01/05/17 0320 01/05/17 0718 01/06/17 0413 01/07/17 0514 01/08/17 0442 01/08/17 0932  WBC  --  7.9 10.4  --  6.7 8.6 8.4  --   CREATININE  --  0.73 1.19*  --  1.22* 1.09* 1.08*  --   LATICACIDVEN 0.78  --   --   --   --   --   --   --   VANCOTROUGH  --   --   --  16  --   --   --  17    Estimated Creatinine Clearance: 43 mL/min (A) (by C-G formula based on SCr of 1.08 mg/dL (H)).    Allergies  Allergen Reactions  . Adhesive [Tape] Other (See Comments)    Skin peels off   . Ciprofloxacin     Unknown reaction   . Latex     Unknown reaction   . Prednisone Nausea And Vomiting    Antimicrobials this admission: 7/17 Cefepime >> 7/21 7/17 Vancomycin >>  7/22 Ceftaz >>  Dose adjustments this admission: 7/19 0730 VT = 16 on vanc 500 q12 (SCr increasing, prior to 4th dose)  Microbiology results:  7/17 BCx: ngtd 7/17 MRSA PCR: (-) 7/17 Sputum (expectorated): rare MRSA (sens: vanc, doxy, bactrim) 7/18 Trach  aspirate: rare MRSA (sens: vanc, doxy, bactrim) and rare Stenotroph.maltophilia (res: LVQ, Bactrim)  Thank you for allowing pharmacy to be a part of this patient's care.  Romeo Rabon, PharmD, pager [phone]. 01/08/2017,10:28 AM.

## 2017-01-09 LAB — GLUCOSE, CAPILLARY
GLUCOSE-CAPILLARY: 221 mg/dL — AB (ref 65–99)
GLUCOSE-CAPILLARY: 179 mg/dL — AB (ref 65–99)
Glucose-Capillary: 176 mg/dL — ABNORMAL HIGH (ref 65–99)
Glucose-Capillary: 241 mg/dL — ABNORMAL HIGH (ref 65–99)
Glucose-Capillary: 183 mg/dL — ABNORMAL HIGH (ref 65–99)
Glucose-Capillary: 147 mg/dL — ABNORMAL HIGH (ref 65–99)
Glucose-Capillary: 148 mg/dL — ABNORMAL HIGH (ref 65–99)
Glucose-Capillary: 177 mg/dL — ABNORMAL HIGH (ref 65–99)

## 2017-01-09 LAB — CREATININE, SERUM: CREATININE: 0.86 mg/dL (ref 0.44–1.00)

## 2017-01-09 MED ORDER — ALPRAZOLAM 0.25 MG PO TABS
0.2500 mg | ORAL_TABLET | Freq: Three times a day (TID) | ORAL | Status: DC | PRN
Start: 2017-01-09 — End: 2017-01-17
  Administered 2017-01-09 – 2017-01-16 (×8): 0.25 mg via ORAL
  Filled 2017-01-09 (×9): qty 1

## 2017-01-09 MED ORDER — PANTOPRAZOLE SODIUM 20 MG PO TBEC
20.0000 mg | DELAYED_RELEASE_TABLET | Freq: Every day | ORAL | Status: DC
  Administered 2017-01-10 – 2017-01-17 (×8): 20 mg via ORAL
  Filled 2017-01-09 (×9): qty 1

## 2017-01-09 MED ORDER — POLYETHYLENE GLYCOL 3350 17 G PO PACK: 17.0000 g | PACK | Freq: Two times a day (BID) | ORAL | Status: DC

## 2017-01-09 MED ORDER — QUETIAPINE FUMARATE 100 MG PO TABS
300.0000 mg | ORAL_TABLET | Freq: Every day | ORAL | Status: DC
  Administered 2017-01-10 – 2017-01-16 (×7): 300 mg via ORAL
  Filled 2017-01-09 (×9): qty 3

## 2017-01-09 MED ORDER — IPRATROPIUM-ALBUTEROL 0.5-2.5 (3) MG/3ML IN SOLN
3.0000 mL | Freq: Four times a day (QID) | RESPIRATORY_TRACT | Status: DC
  Administered 2017-01-09 – 2017-01-14 (×18): 3 mL via RESPIRATORY_TRACT
  Filled 2017-01-09 (×21): qty 3

## 2017-01-09 MED ORDER — CLONAZEPAM 1 MG PO TABS: 1.0000 mg | ORAL_TABLET | Freq: Two times a day (BID) | ORAL | Status: DC

## 2017-01-09 MED ORDER — QUETIAPINE FUMARATE 50 MG PO TABS
50.0000 mg | ORAL_TABLET | Freq: Every day | ORAL | Status: DC
  Administered 2017-01-09: 50 mg via ORAL
  Filled 2017-01-09: qty 1

## 2017-01-09 MED ORDER — PANTOPRAZOLE SODIUM 40 MG PO TBEC
40.0000 mg | DELAYED_RELEASE_TABLET | Freq: Two times a day (BID) | ORAL | Status: DC
  Administered 2017-01-09: 40 mg via ORAL
  Filled 2017-01-09: qty 1

## 2017-01-09 MED ORDER — SENNA 8.6 MG PO TABS
1.0000 | ORAL_TABLET | Freq: Two times a day (BID) | ORAL | Status: DC
  Administered 2017-01-09 – 2017-01-13 (×8): 8.6 mg via ORAL
  Filled 2017-01-09 (×8): qty 1

## 2017-01-09 MED ORDER — GABAPENTIN 100 MG PO CAPS
200.0000 mg | ORAL_CAPSULE | Freq: Four times a day (QID) | ORAL | Status: DC
  Administered 2017-01-09 – 2017-01-17 (×29): 200 mg via ORAL
  Filled 2017-01-09 (×31): qty 2

## 2017-01-09 NOTE — Progress Notes (Signed)
Nutrition Follow-up  DOCUMENTATION CODES:   Not applicable  INTERVENTION:  - Diet advancement as medically feasible. - RD will monitor for nutrition-related needs with diet advancement versus need to for NGT and resumption of TF.   NUTRITION DIAGNOSIS:   Inadequate oral intake related to inability to eat as evidenced by NPO status. -ongoing  GOAL:   Patient will meet greater than or equal to 90% of their needs -unmet at this time, previously met with TF.  MONITOR:   Diet advancement, Weight trends, Labs  ASSESSMENT:   66 year old woman PMH COPD, chronic respiratory failure on home oxygen, diabetes, dementia presented to the emergency department with shortness of breath, hypoxia. Placed on BiPAP, treated with antibiotics and referred for admission for respiratory acidosis, pneumonia, COPD exacerbation, acute respiratory failure.  7/23 Pt self-extubated at ~7:40 AM today; OGT has been removed. Pt was previously receiving Vital AF 1.2 @ 50 mL/hr which provided 1440 kcal, 90 grams of protein, and 973 mL free water. Estimated nutrition needs updated d/t extubation and based on admission weight with current weight +4.8 kg since that time. Pt is A/O to self only this AM. No BM since 01/04/17 despite bowel regimen.  Medications reviewed; 20 mg IV Lasix BID, sliding scale Novolog, 40 mg Solu-medrol BID, 40 mg oral Protonix BID, 1 packet Miralax BID, 1 tablet Senokot BID. Labs reviewed; CBGs: 241 and 183 mg/dL this AM, no BMP since 7/21.    7/20 - OGT in place. - Pt is currently receiving Vital AF 1.2 @ 45 mL/hr which is providing 1296 kcal, 81 grams of protein, and 876 mL free water.  - Weight +1.2 kg from yesterday so yesterday's weight (65.6 kg) used in re-estimating needs.  - Will adjust TF as outlined above based on increase in kcal need.   Patient is currently intubated on ventilator support MV: 6.8 L/min Temp (24hrs), Avg:100.2 F (37.9 C), Min:99.4 F (37.4 C), Max:101.1 F  (38.4 C) BP: 114/58 and MAP: 77 Drip: Fentanyl @ 125 mcg/hr.    7/19 - Pt previously on BiPAP and required intubation which was done yesterday at ~1600 and OGT placed at that time.  - Talked with Dr. Vaughan Browner this AM who reports plan to trial weaning and if unable to successfully wean toward extubation, plan to start TF via OGT.  - Weight has been stable so current weight (65.6 kg) used in re-estimating nutrition needs. - Pt awake and tracks RD around the room.  - RN reports that pt looks better today than yesterday.  - RN spoke with RT and reports pt unable to wean today d/t FiO2 at this time.   Patient is currently intubated on ventilator support MV: 6.5L/min Temp (24hrs), Avg:98.9 F (37.2 C), Min:98.1 F (36.7 C), Max:99.5 F (37.5 C) Propofol: none BP: 112/65 and MAP: 82 Drip: Fentanyl @ 100 mcg/hr.    Diet Order:  Diet NPO time specified Except for: Sips with Meds  Skin:  Reviewed, no issues  Last BM:  7/18  Height:   Ht Readings from Last 1 Encounters:  01/03/17 '4\' 11"'$  (1.499 m)    Weight:   Wt Readings from Last 1 Encounters:  01/09/17 149 lb 7.6 oz (67.8 kg)    Ideal Body Weight:  42.91 kg  BMI:  Body mass index is 30.19 kg/m.  Estimated Nutritional Needs:   Kcal:  1575-1765 (25-28 kcal/kg)  Protein:  63-76 grams (1-1.2 grams/kg)  Fluid:  1.5 L/day  EDUCATION NEEDS:   No education needs identified  at this time    Jarome Matin, MS, RD, LDN, [facility] Inpatient Clinical Dietitian Pager # [phone] After hours/weekend pager # [phone]

## 2017-01-09 NOTE — Plan of Care (Signed)
  Interdisciplinary Goals of Care Family Meeting   Date carried out:: 01/09/2017  Location of the meeting: Bedside  Member's involved: Physician, Bedside Registered Nurse and Family Member or next of kin  Durable Power of Attorney or acting medical decision maker: Son    Discussion: We discussed goals of care for Traci Thomas .  Discussed patient's multiple medical problems with son and other family members at bedside. Explained respiratory status is tenuous. Continuing current plan of care.  Clarified code status.  Code status: Full DNR  Disposition: Continue current acute care  Time spent for the meeting: 11 minutes  Tera Partridge 01/09/2017, 11:14 AM

## 2017-01-09 NOTE — Progress Notes (Signed)
Date:  January 09, 2017 Chart reviewed for concurrent status and case management needs. Will continue to follow patient progress./Extubated to [place] on [id number]/ Discharge Planning: following for needs Expected discharge date: [id number] Trenyce Loera, BSN, [place], [place]

## 2017-01-09 NOTE — Progress Notes (Signed)
   01/08/17 2200 01/08/17 2205 01/08/17 2230  Vitals  BP (!) 84/43 (!) 92/45 (fentanyl rate decreased) (!) 94/52  MAP (mmHg) (!) 53 (!) 59 (!) 62  Pulse Rate 91 93 93  ECG Heart Rate 94 96 92  Resp 14 14 14      01/08/17 2300 01/08/17 2330  Vitals  BP (!) 82/43 (!) 87/45  MAP (mmHg) (!) 53 (!) 57  Pulse Rate 81 75  ECG Heart Rate 84 82  Resp 14 14   Dr. Oletta Darter informed of blood pressures trending down. Fentanyl gtt turned off @ 2230 but patient still remains hypotensive. Order given for 1L NS bolus, will continue to monitor.

## 2017-01-09 NOTE — Procedures (Signed)
Extubation Procedure Note  Patient Details:   Name: Traci Thomas DOB: October 03, 1950 MRN: [id number]   Airway Documentation:     Evaluation  O2 sats: stable throughout Complications: No apparent complications Patient did tolerate procedure well. Bilateral Breath Sounds: Diminished   Yes   Patient self extubated. MD paged by RN on this unit. No stridor noted. Patient able to speak and protecting airway at this time.  Renato Gails [facility] 01/09/2017, 7:49 AM

## 2017-01-09 NOTE — Progress Notes (Signed)
eLink Physician-Brief Progress Note Patient Name: LAFAWN LENOIR DOB: 1950/11/15 MRN: [id number]   Date of Service  01/09/2017  HPI/Events of Note  Patient extubated today, RN feels patient is able to swallow.  Full DNR.  eICU Interventions  Clear liquids only if bedside RN comfortable with patient's swallowing and only supervised.     Intervention Category Major Interventions: Other:  YACOUB,WESAM 01/09/2017, 6:25 PM

## 2017-01-09 NOTE — Progress Notes (Signed)
Name: Traci Thomas MRN: [id number] DOB: Jun 14, 1951    ADMISSION DATE:  01/03/2017 CONSULTATION DATE:  7/18  REFERRING MD :  Doyle Askew   CHIEF COMPLAINT:  Acute on chronic respiratory failure and BIPAP   BRIEF PATIENT DESCRIPTION:  66 year old female admitted 7/17 per [facility] w/ acute on chronic hypoxic and hypercarbic respiratory failure in setting of HCAP +/- acute decompensated diastolic HF. PCCM asked to see on 7/18 for on-going hypoxia. Intubated.  Self-extubated 7/23 am.     SUBJECTIVE:  RN reports pt self extubated this am, tolerating thus far.  Continues to have intermittent jerking.    VITAL SIGNS: Temp:  [99.2 F (37.3 C)-102.8 F (39.3 C)] 99.2 F (37.3 C) (07/23 0800) Pulse Rate:  [61-104] 72 (07/23 0700) Resp:  [14-16] 14 (07/23 0700) BP: (82-129)/(43-79) 123/69 (07/23 0700) SpO2:  [92 %-99 %] 99 % (07/23 0700) FiO2 (%):  [60 %] 60 % (07/23 0436) Weight:  [149 lb 7.6 oz (67.8 kg)] 149 lb 7.6 oz (67.8 kg) (07/23 0400)  PHYSICAL EXAMINATION: Vitals:   01/09/17 0500 01/09/17 0600 01/09/17 0700 01/09/17 0800  BP: 122/79 112/66 123/69   Pulse: 79 77 72   Resp: 14 14 14    Temp:    99.2 F (37.3 C)  TempSrc:    Axillary  SpO2: 97% 95% 99%   Weight:      Height:        General: chronically ill appearing female in NAD, sitting in bed HEENT: MM pink/moist, no jvd PSY: intermittently yells out for her mother/father Neuro: Awake, alert, able to state name but no other orientation quesitons CV: s1s2 rrr, no m/r/g PULM: even/non-labored, lungs bilaterally with wheezing  EX:NTZG, non-tender, bsx4 active  Extremities: warm/dry, BUE 1+ edema, RUE PICC in place Skin: no rashes or lesions   CBC Recent Labs     01/07/17  0514  01/08/17  0442  WBC  8.6  8.4  HGB  8.8*  8.8*  HCT  28.8*  29.1*  PLT  235  206    Coag's No results for input(s): APTT, INR in the last 72 hours.  BMET Recent Labs     01/07/17  0514  01/08/17  0442  01/09/17  0506  NA  146*  147*    --   K  4.5  4.4   --   CL  103  102   --   CO2  35*  37*   --   BUN  44*  39*   --   CREATININE  1.09*  1.08*  0.86  GLUCOSE  223*  252*   --     Electrolytes Recent Labs     01/07/17  0514  01/08/17  0442  CALCIUM  8.5*  8.6*  MG  2.1   --   PHOS  3.6   --     Sepsis Markers No results for input(s): PROCALCITON, O2SATVEN in the last 72 hours.  Invalid input(s): LACTICACIDVEN  ABG No results for input(s): PHART, PCO2ART, PO2ART in the last 72 hours.  Liver Enzymes No results for input(s): AST, ALT, ALKPHOS, BILITOT, ALBUMIN in the last 72 hours.  Cardiac Enzymes No results for input(s): TROPONINI, PROBNP in the last 72 hours.  Glucose Recent Labs     01/08/17  1216  01/08/17  1559  01/08/17  1955  01/08/17  2336  01/09/17  0413  01/09/17  0758  GLUCAP  198*  221*  221*  176*  241*  183*    Imaging Dg Chest Port 1 View  Result Date: 01/08/2017 CLINICAL DATA:  Acute respiratory failure with hypoxemia EXAM: PORTABLE CHEST 1 VIEW COMPARISON:  01/07/2017 FINDINGS: Endotracheal tube in good position. NG tube enters stomach. Right arm PICC tip in the SVC unchanged. Progression of mild bibasilar airspace disease. Upper lobes clear. Negative for edema or effusion IMPRESSION: Progression of bibasilar atelectasis/ infiltrate. Electronically Signed   By: Franchot Gallo M.D.   On: 01/08/2017 07:06   SIGNIFICANT EVENTS  7/17  Admit for HCAP, CHF 7/18  Intubated 7/23  Self extubated  STUDIES  7/20 EEG >> no seizure, likely metabolic encephalopathy 6/28 Head CT >> No acute abnormality, chronic microvascular change, atherosclerosis, chronic bilateral mastoid effusions  CULTURES  Sputum 7/17 >> MRSA, stenotrophamonas (R- levaquin and bactrim) Resp culture 7/18 >> Staph MRSA PCR 7/17 >> negative BCX2 7/17 >> negative  ANTIBIOTICS Vanc 7/16 >> Cefepime 7/16 >> 7/21 Ceftaz 7/22 >>   ASSESSMENT / PLAN:  Acute on Chronic Respiratory Failure with Hypoxemia Acute  COPD exacerbation HCAP MRSA Stenotrophomonas HCAP Acute exacerbation of Diastolic Heart Failure Plan Monitor post (self) extubation  Pulmonary hygiene as able  Continue bronvana + pulmicort  ID following > recommends sensitivities on ceftazidime (steno R to levaquin & bactrim) ABX as above Solumedrol 40mg  IV Q12  Acute on chronic diastolic HF  Plan Tele monitoring  Strict I/O  Continue lasix   PRN hydralazine  Metoprolol 5mg  IV Q8  Acute metabolic encephalopathy - hypercarbia, medication related Myoclonic jerking - suspect related to medication withdrawal (benzo, effexor) and hypercarbia; EEG without seizure activity Plan Continue clonazepam and effexor    Anemia without bleeding Plan Trend CBC  Monitor for bleeding   Feeding: NPOx meds x 4 hours > advance as tolerated Analgesia: n/a Sedation: n/a Thromboprophylaxis: Lovenox HOB >30 degrees Ulcer prophylaxis: PPI Glucose control: SSI  Code Status - pt has been DNR in the past.  Son updated via phone 7/22.  She self extubated 7/23 and is tolerating thus far.  Pending family meeting regarding goals of care.    Noe Gens, NP-C [facility] Pgr: [phone] or if no answer [phone] 01/09/2017, 9:20 AM

## 2017-01-10 ENCOUNTER — Inpatient Hospital Stay (HOSPITAL_COMMUNITY): Payer: Medicare Other

## 2017-01-10 LAB — CBC
HEMATOCRIT: 32 % — AB (ref 36.0–46.0)
Hemoglobin: 9.5 g/dL — ABNORMAL LOW (ref 12.0–15.0)
MCH: 27.8 pg (ref 26.0–34.0)
MCHC: 29.7 g/dL — ABNORMAL LOW (ref 30.0–36.0)
MCV: 93.6 fL (ref 78.0–100.0)
PLATELETS: 196 10*3/uL (ref 150–400)
RBC: 3.42 MIL/uL — ABNORMAL LOW (ref 3.87–5.11)
RDW: 16.9 % — ABNORMAL HIGH (ref 11.5–15.5)
WBC: 13.3 10*3/uL — AB (ref 4.0–10.5)

## 2017-01-10 LAB — GLUCOSE, CAPILLARY
GLUCOSE-CAPILLARY: 187 mg/dL — AB (ref 65–99)
GLUCOSE-CAPILLARY: 199 mg/dL — AB (ref 65–99)
Glucose-Capillary: 116 mg/dL — ABNORMAL HIGH (ref 65–99)
Glucose-Capillary: 185 mg/dL — ABNORMAL HIGH (ref 65–99)
Glucose-Capillary: 142 mg/dL — ABNORMAL HIGH (ref 65–99)

## 2017-01-10 LAB — BASIC METABOLIC PANEL
Anion gap: 8 (ref 5–15)
BUN: 29 mg/dL — ABNORMAL HIGH (ref 6–20)
CHLORIDE: 96 mmol/L — AB (ref 101–111)
CO2: 40 mmol/L — ABNORMAL HIGH (ref 22–32)
CREATININE: 0.77 mg/dL (ref 0.44–1.00)
Calcium: 8.8 mg/dL — ABNORMAL LOW (ref 8.9–10.3)
GFR calc non Af Amer: >60 mL/min (ref >60)
Glucose, Bld: 141 mg/dL — ABNORMAL HIGH (ref 65–99)
Potassium: 4.5 mmol/L (ref 3.5–5.1)
SODIUM: 144 mmol/L (ref 135–145)

## 2017-01-10 LAB — APTT: aPTT: 21 seconds — ABNORMAL LOW (ref 24–36)

## 2017-01-10 LAB — PROTIME-INR
INR: 1.12
PROTHROMBIN TIME: 14.4 s (ref 11.4–15.2)

## 2017-01-10 LAB — MAGNESIUM: MAGNESIUM: 2.1 mg/dL (ref 1.7–2.4)

## 2017-01-10 MED ORDER — METHYLPREDNISOLONE SODIUM SUCC 40 MG IJ SOLR
40.0000 mg | Freq: Every day | INTRAMUSCULAR | Status: DC
  Administered 2017-01-10 – 2017-01-12 (×3): 40 mg via INTRAVENOUS
  Filled 2017-01-10 (×3): qty 1

## 2017-01-10 MED ORDER — INSULIN ASPART 100 UNIT/ML ~~LOC~~ SOLN
0.0000 [IU] | Freq: Every day | SUBCUTANEOUS | Status: DC
  Administered 2017-01-15: 2 [IU] via SUBCUTANEOUS

## 2017-01-10 MED ORDER — INSULIN ASPART 100 UNIT/ML ~~LOC~~ SOLN
0.0000 [IU] | Freq: Three times a day (TID) | SUBCUTANEOUS | Status: DC
  Administered 2017-01-10 – 2017-01-11 (×3): 3 [IU] via SUBCUTANEOUS
  Administered 2017-01-11: 5 [IU] via SUBCUTANEOUS
  Administered 2017-01-12: 3 [IU] via SUBCUTANEOUS
  Administered 2017-01-12: 5 [IU] via SUBCUTANEOUS
  Administered 2017-01-12 – 2017-01-13 (×2): 3 [IU] via SUBCUTANEOUS
  Administered 2017-01-13: 5 [IU] via SUBCUTANEOUS
  Administered 2017-01-14: 8 [IU] via SUBCUTANEOUS
  Administered 2017-01-14: 2 [IU] via SUBCUTANEOUS
  Administered 2017-01-14 – 2017-01-15 (×2): 3 [IU] via SUBCUTANEOUS
  Administered 2017-01-15: 2 [IU] via SUBCUTANEOUS
  Administered 2017-01-15: 8 [IU] via SUBCUTANEOUS
  Administered 2017-01-16: 3 [IU] via SUBCUTANEOUS
  Administered 2017-01-16: 2 [IU] via SUBCUTANEOUS
  Administered 2017-01-16 – 2017-01-17 (×2): 3 [IU] via SUBCUTANEOUS

## 2017-01-10 MED ORDER — METOPROLOL TARTRATE 25 MG PO TABS
12.5000 mg | ORAL_TABLET | Freq: Two times a day (BID) | ORAL | Status: DC
  Administered 2017-01-10 – 2017-01-17 (×15): 12.5 mg via ORAL
  Filled 2017-01-10 (×15): qty 1

## 2017-01-10 NOTE — Clinical Social Work Note (Signed)
Clinical Social Work Assessment  Patient Details  Name: Traci Thomas MRN: [id number] Date of Birth: Jul 15, 1950  Date of referral:  01/10/17               Reason for consult:  Facility Placement, Discharge Planning                Permission sought to share information with:    Permission granted to share information::     Name::        Agency::     Relationship::     Contact Information:     Housing/Transportation Living arrangements for the past 2 months:  [place] of Information:  Medical Team Patient Interpreter Needed:  None Criminal Activity/Legal Involvement Pertinent to Current Situation/Hospitalization:  No - Comment as needed Significant Relationships:  Siblings, Adult Children Lives with:  Facility Resident Do you feel safe going back to the place where you live?   (Pt doesn't recall where she was placed for SNF.) Need for family participation in patient care:  Yes (Comment)  Care giving concerns:  No family at bedside.   Social Worker assessment / plan:  Pt hospitalized from [facility] on 01/03/17 with  Acute respiratory failure with hypoxia and hypercapnia. Pt has required vent support during hospitalization. Pt self extubated on 01/09/17. CSW met with pt this am to offer support. Pt was crying / calling out. Pt was able to be redirected. . Pt is oriented x1 and unable to participate in dc planning. VM left for pt's son to contact CSW for assistance with dc planning. Awaiting return call. CSW will continue to follow to assist with d/c planning needs.  Employment status:  Disabled (Comment on whether or not currently receiving Disability) Insurance information:    PT Recommendations:  Not assessed at this time Information / Referral to community resources:     Patient/Family's Response to care:  No family at bedside.  Patient/Family's Understanding of and Emotional Response to Diagnosis, Current Treatment, and Prognosis:  PN indicate that MD spoke  with pt's son on 01/09/17. Awaiting return call from son.   Emotional Assessment Appearance:  Appears stated age Attitude/Demeanor/Rapport:  Crying Affect (typically observed):  Anxious, Tearful/Crying Orientation:  Oriented to Self Alcohol / Substance use:  Not Applicable Psych involvement (Current and /or in the community):  No (Comment)  Discharge Needs  Concerns to be addressed:  Discharge Planning Concerns Readmission within the last 30 days:  No Current discharge risk:  None Barriers to Discharge:  No Barriers Identified   Traci Thomas, Traci Thomas 01/10/2017, 11:55 AM

## 2017-01-10 NOTE — NC FL2 (Signed)
[place] LEVEL OF CARE SCREENING TOOL     IDENTIFICATION  Patient Name: Traci Thomas Birthdate: January 24, 1951 Sex: female Admission Date (Current Location): 01/03/2017  [facility] and [place] Number:  Herbalist and Address:  [facility],  [place] [address], [place]      Provider Number: [id number]  Attending Physician Name and Address:  Marshell Garfinkel, MD  Relative Name and Phone Number:       Current Level of Care: Hospital Recommended Level of Care: [place] Prior Approval Number:    Date Approved/Denied:   PASRR Number: [id number]  Discharge Plan: SNF    Current Diagnoses: Patient Active Problem List   Diagnosis Date Noted  . Acute respiratory failure with hypoxia and hypercapnia ([place]) 01/03/2017  . Acute respiratory acidosis 01/03/2017  . Normocytic anemia 01/03/2017  . Chronic respiratory failure with hypoxia ([place]) 11/30/2016  . Constipation 11/02/2016  . Lobar pneumonia ([place]) 10/04/2016  . Abdominal pain   . Hip fracture ([place]) 10/21/2015  . Stage 4 very severe COPD by GOLD classification ([place]) 10/21/2015  . Chronic anemia 10/21/2015  . Dementia 10/21/2015  . Preoperative respiratory examination 10/21/2015    Class: Acute  . Closed right hip fracture ([place])   . Encounter for palliative care   . COPD exacerbation ([place]) 03/25/2015  . Diabetes mellitus type 2 in nonobese ([place]) 03/25/2015  . Depression 03/25/2015    Orientation RESPIRATION BLADDER Height & Weight     Self  O2 3L/min Incontinent Weight: 67.1 kg (147 lb 14.9 oz) Height:  '4\' 11"'$  (149.9 cm)  BEHAVIORAL SYMPTOMS/MOOD NEUROLOGICAL BOWEL NUTRITION STATUS  Other (Comment) (May call out when anxious.)   Incontinent Diet (See dc summary.)  AMBULATORY STATUS COMMUNICATION OF NEEDS Skin   Extensive Assist Verbally Normal                       Personal Care Assistance Level of Assistance  Bathing, Feeding, Dressing Bathing Assistance:  Maximum assistance Feeding assistance: Limited assistance Dressing Assistance: Maximum assistance     Functional Limitations Info  Sight, Hearing, Speech Sight Info: Adequate Hearing Info: Adequate Speech Info: Adequate    SPECIAL CARE FACTORS FREQUENCY  PT (By licensed PT)     PT Frequency: 5x wk              Contractures Contractures Info: Not present    Additional Factors Info  Allergies, Code Status, Isolation Precautions Code Status Info: DNR Allergies Info: Adhesive Tape, Ciprofloxacin, Latex, Prednisone     Isolation Precautions Info: 01/03/17 positive respiratory culture for MRSA     Current Medications (01/10/2017):  This is the current hospital active medication list Current Facility-Administered Medications  Medication Dose Route Frequency Provider Last Rate Last Dose  . acetaminophen (TYLENOL) tablet 650 mg  650 mg Oral Q6H PRN Samuella Cota, MD   650 mg at 01/10/17 9562   Or  . acetaminophen (TYLENOL) suppository 650 mg  650 mg Rectal Q6H PRN Samuella Cota, MD      . ALPRAZolam Duanne Moron) tablet 0.25 mg  0.25 mg Oral TID PRN Javier Glazier, MD   0.25 mg at 01/10/17 1109  . bisacodyl (DULCOLAX) suppository 10 mg  10 mg Rectal Daily PRN Samuella Cota, MD      . budesonide (PULMICORT) nebulizer solution 0.5 mg  0.5 mg Nebulization BID Simonne Maffucci B, MD   0.5 mg at 01/10/17 1308  . cefTAZidime (FORTAZ) 2  g in dextrose 5 % 50 mL IVPB  2 g Intravenous Q12H Marshell Garfinkel, MD   Stopped at 01/10/17 1210  . chlorhexidine gluconate (MEDLINE KIT) (PERIDEX) 0.12 % solution 15 mL  15 mL Mouth Rinse BID Mannam, Praveen, MD   15 mL at 01/10/17 0828  . Chlorhexidine Gluconate Cloth 2 % PADS 6 each  6 each Topical Daily Flora Lipps, MD   6 each at 01/10/17 1000  . furosemide (LASIX) injection 20 mg  20 mg Intravenous Q12H Erick Colace, NP   20 mg at 01/10/17 0936  . gabapentin (NEURONTIN) capsule 200 mg  200 mg Oral Q6H Javier Glazier, MD   200  mg at 01/10/17 0500  . hydrALAZINE (APRESOLINE) injection 20 mg  20 mg Intravenous Q4H PRN Erick Colace, NP   20 mg at 01/04/17 1423  . insulin aspart (novoLOG) injection 0-15 Units  0-15 Units Subcutaneous TID WC Nestor, Sonia Baller, MD      . insulin aspart (novoLOG) injection 0-5 Units  0-5 Units Subcutaneous QHS Javier Glazier, MD      . ipratropium-albuterol (DUONEB) 0.5-2.5 (3) MG/3ML nebulizer solution 3 mL  3 mL Nebulization Q2H PRN Theodis Blaze, MD   3 mL at 01/06/17 1114  . ipratropium-albuterol (DUONEB) 0.5-2.5 (3) MG/3ML nebulizer solution 3 mL  3 mL Nebulization Q6H Ollis, Brandi L, NP   3 mL at 01/10/17 0821  . MEDLINE mouth rinse  15 mL Mouth Rinse QID Mannam, Praveen, MD   15 mL at 01/10/17 1210  . methylPREDNISolone sodium succinate (SOLU-MEDROL) 40 mg/mL injection 40 mg  40 mg Intravenous Daily Javier Glazier, MD   40 mg at 01/10/17 0959  . metoprolol tartrate (LOPRESSOR) tablet 12.5 mg  12.5 mg Oral BID Javier Glazier, MD      . mupirocin ointment (BACTROBAN) 2 % 1 application  1 application Nasal BID Juanito Doom, MD   1 application at 65/46/50 [phone]  . oxybutynin (DITROPAN-XL) 24 hr tablet 5 mg  5 mg Oral Q breakfast Samuella Cota, MD   5 mg at 01/10/17 5681  . pantoprazole (PROTONIX) EC tablet 20 mg  20 mg Oral Daily Javier Glazier, MD      . QUEtiapine (SEROQUEL) tablet 300 mg  300 mg Oral QHS Ollis, Brandi L, NP      . senna (SENOKOT) tablet 8.6 mg  1 tablet Oral BID Javier Glazier, MD   8.6 mg at 01/10/17 0935  . sodium chloride flush (NS) 0.9 % injection 10-40 mL  10-40 mL Intracatheter Q12H Flora Lipps, MD   10 mL at 01/10/17 0937  . sodium chloride flush (NS) 0.9 % injection 10-40 mL  10-40 mL Intracatheter PRN Flora Lipps, MD   20 mL at 01/08/17 0913  . sodium chloride flush (NS) 0.9 % injection 3 mL  3 mL Intravenous Q12H Samuella Cota, MD   3 mL at 01/10/17 1000  . vancomycin (VANCOCIN) IVPB 1000 mg/200 mL premix  1,000 mg  Intravenous Q24H Randa Spike, RPH   Stopped at 01/10/17 1036  . venlafaxine (EFFEXOR) tablet 50 mg  50 mg Oral BID WC Juanito Doom, MD   50 mg at 01/10/17 2751     Discharge Medications: Please see discharge summary for a list of discharge medications.  Relevant Imaging Results:  Relevant Lab Results:   Additional Information SSN: [id number]  Haidinger, Randall An, LCSW

## 2017-01-10 NOTE — Progress Notes (Addendum)
[facility] Attending Note  ADMISSION DATE:01/03/2017  CONSULTATION DATE:01/04/2017  REFERRING OI:ZTIWP Doyle Askew / [facility]  Presenting HPI:  66 y.o. female admitted 7/17 per [facility] w/ acute on chronic hypoxic and hypercarbic respiratory failure in setting of HCAP +/- acute decompensated diastolic HF. PCCM asked to see on 7/18 for on-going hypoxia. Intubated. Self-extubated 7/23 am.   Subjective:  Patient reports breathing is improving. Mild cough. No chest pain or pressure. Patient denies nausea. She is still altered.   Review of Systems:  Unable to obtain given persistent mild delirium.   Temp:  [98 F (36.7 C)-98.9 F (37.2 C)] 98.1 F (36.7 C) (07/24 0800) Pulse Rate:  [39-94] 71 (07/24 0600) Resp:  [9-19] 18 (07/24 0600) BP: (110-147)/(63-82) 133/65 (07/24 0600) SpO2:  [83 %-100 %] 91 % (07/24 0822) Weight:  [147 lb 14.9 oz (67.1 kg)] 147 lb 14.9 oz (67.1 kg) (07/24 0306)  General:  No family at bedside. Awake & watching TV. No distress. Integument:  Warm & dry. No rash on exposed skin. HEENT:  Moist mucus memebranes. No scleral icterus.  Neurological:  Pupils symmetric. Moving all 4 extremities. Oriented to person, place and year but not events that have transpired. Musculoskeletal:  No joint effusion or erythema appreciated. Symmetric muscle bulk. Pulmonary:  Coarse breath sounds bilaterally. Normal work of breathing on high-flow nasal cannula. Cardiovascular:  Regular rate. No appreciable JVD. No edema. Abdomen:  Soft. Nondistended. Normoactive bowel sounds. Nontender.  LINES/TUBES: OETT 7/18 - 7/23 (self-extubated) OGT 7/18 - 7/23 RUE PICC >>> Foley 7/18 >>> PIV  CBC Latest Ref Rng & Units 01/10/2017 01/08/2017 01/07/2017  WBC 4.0 - 10.5 K/uL 13.3(H) 8.4 8.6  Hemoglobin 12.0 - 15.0 g/dL 9.5(L) 8.8(L) 8.8(L)  Hematocrit 36.0 - 46.0 % 32.0(L) 29.1(L) 28.8(L)  Platelets 150 - 400 K/uL 196 206 235   BMP Latest Ref Rng & Units 01/10/2017 01/09/2017  01/08/2017  Glucose 65 - 99 mg/dL 141(H) - 252(H)  BUN 6 - 20 mg/dL 29(H) - 39(H)  Creatinine 0.44 - 1.00 mg/dL 0.77 0.86 1.08(H)  Sodium 135 - 145 mmol/L 144 - 147(H)  Potassium 3.5 - 5.1 mmol/L 4.5 - 4.4  Chloride 101 - 111 mmol/L 96(L) - 102  CO2 22 - 32 mmol/L 40(H) - 37(H)  Calcium 8.9 - 10.3 mg/dL 8.8(L) - 8.6(L)    Hepatic Function Latest Ref Rng & Units 01/03/2017 09/28/2016 10/22/2015  Total Protein 6.5 - 8.1 g/dL 6.9 8.0 6.2(L)  Albumin 3.5 - 5.0 g/dL 3.2(L) 4.1 3.6  AST 15 - 41 U/L 10(L) 16 14(L)  ALT 14 - 54 U/L 9(L) 9(L) 12(L)  Alk Phosphatase 38 - 126 U/L 77 131(H) 55  Total Bilirubin 0.3 - 1.2 mg/dL <0.1(L) 0.5 0.7    IMAGING/STUDIES: TTE 10/13/16:  LV with mild concentric hypertrophy & EF 50-55%. Unable to assess diastolic function. No wall motion abnormalities. LA & RA normal in size. RV normal in size and function. No aortic stenosis or regurgitation. Aortic root normal in size. No mitral stenosis or regurgitation. No pulmonic stenosis or regurgitation. No tricuspid regurgitation. No pericardial effusion. EEG 7/20:  No seizures.  CT HEAD W/O 7/20:  No acute abnormality. Chronic microvascular ischemic change and advanced for age cortical atrophy.  Atherosclerosis. Chronic bilateral mastoid effusions. PORT CXR 7/22:  Previously reviewed by me. Persistent blunting of bilateral costophrenic angles with hazy opacification. No pleural effusion appreciated. Right upper extremity PICC line in place. Endotracheal tube and enteric feeding tube in place. PORT CXR 7/24:  Personally reviewed by me. Right upper extremity PICC line in place. Persistent bilateral basilar opacities right greater than left. No new focal opacity appreciated.  MICROBIOLOGY: MRSA PCR 7/17:  Negative  Blood Cultures x2 7/17:  Negative  Tracheal Aspirate Culture 7/18:  MRSA & Stenotrophomonas maltophilia Urine Streptococcal Antigen 7/17:  Negative   ANTIBIOTICS: Cefepime 7/16 - 7/21 Vancomycin 7/16  >>> Tressie Ellis 7/22 >>>  SIGNIFICANT EVENTS: 07/17 - Admit 07/18 - Intubated for worsening respiratory status 07/23 - Self extubated & code status clarified w/ son & other family as DNR/DNI  ASSESSMENT/PLAN:  66 y.o. Female with acute on chronic hypoxic respiratory failure and pneumonia due to MRSA and stenotrophomonas. Delirium is slowly improving. Patient's respiratory status overall seems to be stable.  1. Acute on chronic hypoxic respiratory failure: Secondary to pneumonia. Continuing to wean FiO2. Continuing pulmonary toilet with incentive spirometry. Continuing diuresis with Lasix IV every 12 hours for now. Continuous pulse oximetry monitoring. 2. Sepsis: Secondary to pneumonia. Antibiotics as above. 3. MRSA & stenotrophomonas pneumonia: ID following and guiding antibiotics. Awaiting further sensitivities. Continuing empiric vancomycin and Fortaz. Plan for repeat chest x-ray in 4-6 weeks to ensure resolution of her opacities. 4. COPD with exacerbation: Transitioning to Solu-Medrol IV daily. Continuing Pulmicort nebulized twice daily & Duonebs every 6 hours.  5. Acute encephalopathy: Likely delirium/toxic metabolic encephalopathy. Limiting sedatives. Continuing nocturnal Seroquel. 6. Acute on chronic diastolic congestive heart failure:  Continuing Lasix diuresis. Switching from IV Lopressor to metoprolol 12.5 mg by mouth twice a day. Continuous telemetry monitoring. 7. Myoclonic jerking: Multifactorial in etiology. Continuing home Neurontin and Effexor. 8. Anemia: No signs of active bleeding. Hemoglobin stable. Trending cell counts daily with CBC. 9. Diabetes mellitus: Switching Accu-Cheks to every before meals & at bedtime. Continuing moderate dose sliding scale insulin. 10. Hematuria:  Checking complete abdominal ultrasound and coags. Holding on nephrology consult for now.   Prophylaxis:  SCDs, Lovenox Atkins daily, & Protonix PO daily. Diet:  Advanced to clear liquids with nurse at bedside  yesterday. Code Status:  Full DO NOT RESUSCITATE/DO NOT INTUBATE. Disposition:  Patient to remain in ICU given tenuous respiratory status. Family Update:  No family at bedside 7/24 during rounds. Family updated 7/23 during Plan of Care meeting.  I have spent a total of 36 minutes of time today caring for the patient, reviewing the patient's electronic medical record, and with more than 50% of that time spent coordinating care with the patient as well as reviewing the continuing plan of care with the patient at bedside.  TR[facility] to assume care & PCCM will follow as a consultant starting 7/25.  Sonia Baller Ashok Cordia, M.D. [facility] Pager:  [phone] After 3pm or if no response, call [phone] 9:13 AM 01/10/17

## 2017-01-10 NOTE — Progress Notes (Signed)
Patient transferred from ICU, VSS, tele applied. Agree with previous RN assessment. Denies any needs at this time. Will continue to monitor.  

## 2017-01-10 NOTE — Progress Notes (Signed)
Pt's son contacted CSW to discuss dc planning. Son requested new SNF search to see if other options are available. CSW has initiated SNF search and will review bed offers with son once available.  Werner Lean LCSW [phone]

## 2017-01-11 ENCOUNTER — Inpatient Hospital Stay (HOSPITAL_COMMUNITY): Payer: Medicare Other

## 2017-01-11 ENCOUNTER — Encounter (HOSPITAL_COMMUNITY): Payer: Self-pay | Admitting: Radiology

## 2017-01-11 DIAGNOSIS — R310 Gross hematuria: Secondary | ICD-10-CM

## 2017-01-11 DIAGNOSIS — N1330 Unspecified hydronephrosis: Secondary | ICD-10-CM

## 2017-01-11 LAB — CBC WITH DIFFERENTIAL/PLATELET
BASOS ABS: 0 10*3/uL (ref 0.0–0.1)
Basophils Relative: 0 %
EOS PCT: 1 %
Eosinophils Absolute: 0.2 10*3/uL (ref 0.0–0.7)
HCT: 36.6 % (ref 36.0–46.0)
Hemoglobin: 10.9 g/dL — ABNORMAL LOW (ref 12.0–15.0)
Lymphocytes Relative: 12 %
Lymphs Abs: 1.8 10*3/uL (ref 0.7–4.0)
MCH: 27.8 pg (ref 26.0–34.0)
MCHC: 29.8 g/dL — AB (ref 30.0–36.0)
MCV: 93.4 fL (ref 78.0–100.0)
MONO ABS: 1.3 10*3/uL — AB (ref 0.1–1.0)
MONOS PCT: 8 %
Neutro Abs: 12.3 10*3/uL — ABNORMAL HIGH (ref 1.7–7.7)
Neutrophils Relative %: 79 %
PLATELETS: 234 10*3/uL (ref 150–400)
RBC: 3.92 MIL/uL (ref 3.87–5.11)
RDW: 16.9 % — AB (ref 11.5–15.5)
WBC: 15.5 10*3/uL — ABNORMAL HIGH (ref 4.0–10.5)

## 2017-01-11 LAB — RENAL FUNCTION PANEL
ANION GAP: 10 (ref 5–15)
Albumin: 3.7 g/dL (ref 3.5–5.0)
BUN: 20 mg/dL (ref 6–20)
CALCIUM: 9.5 mg/dL (ref 8.9–10.3)
CHLORIDE: 92 mmol/L — AB (ref 101–111)
CO2: 43 mmol/L — AB (ref 22–32)
Creatinine, Ser: 0.87 mg/dL (ref 0.44–1.00)
GFR calc Af Amer: >60 mL/min (ref >60)
GFR calc non Af Amer: >60 mL/min (ref >60)
GLUCOSE: 152 mg/dL — AB (ref 65–99)
POTASSIUM: 3.5 mmol/L (ref 3.5–5.1)
Phosphorus: 3.2 mg/dL (ref 2.5–4.6)
SODIUM: 145 mmol/L (ref 135–145)

## 2017-01-11 LAB — GLUCOSE, CAPILLARY
GLUCOSE-CAPILLARY: 154 mg/dL — AB (ref 65–99)
GLUCOSE-CAPILLARY: 136 mg/dL — AB (ref 65–99)
Glucose-Capillary: 109 mg/dL — ABNORMAL HIGH (ref 65–99)
Glucose-Capillary: 218 mg/dL — ABNORMAL HIGH (ref 65–99)

## 2017-01-11 LAB — MAGNESIUM: Magnesium: 2.1 mg/dL (ref 1.7–2.4)

## 2017-01-11 MED ORDER — IOPAMIDOL (ISOVUE-300) INJECTION 61%: 100.0000 mL | Freq: Once | INTRAVENOUS | Status: DC | PRN

## 2017-01-11 MED ORDER — FUROSEMIDE 10 MG/ML IJ SOLN: 20.0000 mg | Freq: Every day | INTRAMUSCULAR | Status: DC

## 2017-01-11 MED ORDER — IOPAMIDOL (ISOVUE-300) INJECTION 61%
INTRAVENOUS | Status: AC
  Filled 2017-01-11: qty 100

## 2017-01-11 MED ORDER — FUROSEMIDE 20 MG PO TABS
20.0000 mg | ORAL_TABLET | Freq: Every day | ORAL | Status: DC
  Administered 2017-01-12 – 2017-01-17 (×6): 20 mg via ORAL
  Filled 2017-01-11 (×6): qty 1

## 2017-01-11 MED ORDER — POLYETHYLENE GLYCOL 3350 17 G PO PACK
17.0000 g | PACK | Freq: Two times a day (BID) | ORAL | Status: DC
  Administered 2017-01-11 – 2017-01-12 (×4): 17 g via ORAL
  Filled 2017-01-11 (×4): qty 1

## 2017-01-11 NOTE — Progress Notes (Signed)
Physical Therapy Evaluation Patient Details Name: Traci Thomas MRN: [id number] DOB: October 11, 1950 Today's Date: 01/11/2017   History of Present Illness  Pt is a 66 y.o. with PMHx of COPD, chronic respiratory failure on home oxygen, diabetes, and dementia. Pt presented to the emergency department with shortness of breath and hypoxia. Pt currently admitted for acute respiratory failure with hypoxia, sepsis, COPD exacerbation, and acute diastolic congestive heart failure.  ETT 7/18 >> 7/23  Clinical Impression  Pt admitted with conditions listed above. Pt currently with functional limitations due to the deficits listed below. Pt will benefit from skilled PT to increase their independence and safety with mobility to allow discharge. Pt required mod assist with bed mobility and min assist with stand pivot transfer today. Pt unable to ambulate at this time.     Follow Up Recommendations SNF    Equipment Recommendations  Rolling walker with 5" wheels    Recommendations for Other Services       Precautions / Restrictions Precautions Precautions: Fall Precaution Comments: Monitor Sats, Pt currently under high flow nasal cannula with baseline of 6 L/min at SNF Restrictions Weight Bearing Restrictions: No      Mobility  Bed Mobility Overal bed mobility: Needs Assistance Bed Mobility: Supine to Sit     Supine to sit: Mod assist;HOB elevated     General bed mobility comments: Pt required mod assist for LE management and to steady trunk  Transfers Overall transfer level: Needs assistance Equipment used: 2 person hand held assist Transfers: Stand Pivot Transfers   Stand pivot transfers: Min assist       General transfer comment: Pt able to sidestep but required UE support for balance, Pt's SPO2 dropped to 84% following transfer on 9 L/min on HFNC  Ambulation/Gait Ambulation/Gait assistance:  (Pt unable to ambulate at this time)              [employer] officer, [employer]    Modified Rankin (Stroke Patients Only)       Balance Overall balance assessment: Needs assistance   Sitting balance-Leahy Scale: Poor Sitting balance - Comments: Pt requires UE support for sitting balance    Standing balance support: Single extremity supported Standing balance-Leahy Scale: Poor Standing balance comment: Pt requires UE support for standing balance                              Pertinent Vitals/Pain Pain Assessment: No/denies pain    Home Living Family/patient expects to be discharged to:: Skilled nursing facility                      Prior Function Level of Independence: Needs assistance   Gait / Transfers Assistance Needed: Pt able to ambulate with use of RW  ADL's / Homemaking Assistance Needed: Pt requires assistance for ADL's        Hand Dominance        Extremity/Trunk Assessment   Upper Extremity Assessment Upper Extremity Assessment: Generalized weakness    Lower Extremity Assessment Lower Extremity Assessment: Generalized weakness       Communication   Communication: HOH  Cognition Arousal/Alertness: Awake/alert Behavior During Therapy: WFL for tasks assessed/performed;Anxious Overall Cognitive Status: History of cognitive impairments - at baseline  General Comments: Pt has documented dementia       General Comments      Exercises     Assessment/Plan    PT Assessment Patient needs continued PT services  PT Problem List Decreased strength;Decreased mobility;Decreased safety awareness;Decreased range of motion;Decreased coordination;Decreased activity tolerance;Cardiopulmonary status limiting activity;Decreased balance;Decreased knowledge of use of DME       PT Treatment Interventions DME instruction;Therapeutic activities;Gait training;Therapeutic exercise;Patient/family education;Balance training;Functional mobility training    PT Goals (Current  goals can be found in the Care Plan section)  Acute Rehab PT Goals PT Goal Formulation: With patient Time For Goal Achievement: 01/25/17 Potential to Achieve Goals: Good    Frequency Min 3X/week   Barriers to discharge        Co-evaluation               AM-PAC PT "6 Clicks" Daily Activity  Outcome Measure Difficulty turning over in bed (including adjusting bedclothes, sheets and blankets)?: Total Difficulty moving from lying on back to sitting on the side of the bed? : Total Difficulty sitting down on and standing up from a chair with arms (e.g., wheelchair, bedside commode, etc,.)?: Total Help needed moving to and from a bed to chair (including a wheelchair)?: A Lot Help needed walking in hospital room?: A Lot Help needed climbing 3-5 steps with a railing? : A Lot 6 Click Score: 9    End of Session Equipment Utilized During Treatment: Oxygen Activity Tolerance: Patient limited by fatigue Patient left: in chair;with family/visitor present;with call bell/phone within reach;with chair alarm set;with nursing/sitter in room Nurse Communication: Mobility status PT Visit Diagnosis: Difficulty in walking, not elsewhere classified (R26.2)    Time: 0943-1000 PT Time Calculation (min) (ACUTE ONLY): 17 min   Charges:   PT Evaluation $PT Eval Low Complexity: 1 Procedure     PT G CodesOlegario Shearer, SPT  Reino Bellis 01/11/2017, 12:36 PM

## 2017-01-11 NOTE — Care Management Important Message (Signed)
Important Message  Patient Details IM Letter given to Kathy/Case Manager to present to Patient Name: CARLITA WHITCOMB MRN: [id number] Date of Birth: 1951/03/10   Medicare Important Message Given:  Yes    Kerin Salen 01/11/2017, 11:12 AMImportant Message  Patient Details  Name: LATRAVIA SOUTHGATE MRN: [id number] Date of Birth: September 15, 1950   Medicare Important Message Given:  Yes    Kerin Salen 01/11/2017, 11:12 AM

## 2017-01-11 NOTE — Progress Notes (Signed)
[facility] Note  ADMISSION DATE:01/03/2017  CONSULTATION DATE:01/04/2017  REFERRING EL:FYBOF Doyle Askew / [facility]  Presenting HPI:  66 y.o. female admitted 7/17 per [facility] w/ acute on chronic hypoxic and hypercarbic respiratory failure in setting of HCAP +/- acute decompensated diastolic HF. PCCM asked to see on 7/18 for on-going hypoxia. Intubated. Self-extubated 7/23 am.   Subjective:  Pt reports LLQ pain, feels breathing is ok.  States she is hot.  RN reports pt calm this am, up to chair with PT.    Temp:  [98.6 F (37 C)-98.9 F (37.2 C)] 98.7 F (37.1 C) (07/25 0513) Pulse Rate:  [73-87] 77 (07/25 0513) Resp:  [16-20] 20 (07/25 0513) BP: (118-162)/(55-79) 134/75 (07/25 0513) SpO2:  [57 %-98 %] 90 % (07/25 0714) Weight:  [141 lb 15.6 oz (64.4 kg)] 141 lb 15.6 oz (64.4 kg) (07/25 0500)  General: chronically ill appearing adult female in NAD, up to chair HEENT: MM pink/moist, no jvd, [place] O2 PSY: calm Neuro: oriented to self, place, situational confusion at times, MAE, normal strength CV: s1s2 rrr, no m/r/g PULM: even/non-labored, lungs bilaterally diminished, faint wheezing on R BP:ZWCH, non-tender, bsx4 active  Extremities: warm/dry, no edema  Skin: no rashes or lesions   LINES/TUBES: OETT 7/18 - 7/23 (self-extubated) OGT 7/18 - 7/23 RUE PICC >>> Foley 7/18 >>> PIV  CBC Latest Ref Rng & Units 01/11/2017 01/10/2017 01/08/2017  WBC 4.0 - 10.5 K/uL 15.5(H) 13.3(H) 8.4  Hemoglobin 12.0 - 15.0 g/dL 10.9(L) 9.5(L) 8.8(L)  Hematocrit 36.0 - 46.0 % 36.6 32.0(L) 29.1(L)  Platelets 150 - 400 K/uL 234 196 206   BMP Latest Ref Rng & Units 01/10/2017 01/09/2017 01/08/2017  Glucose 65 - 99 mg/dL 141(H) - 252(H)  BUN 6 - 20 mg/dL 29(H) - 39(H)  Creatinine 0.44 - 1.00 mg/dL 0.77 0.86 1.08(H)  Sodium 135 - 145 mmol/L 144 - 147(H)  Potassium 3.5 - 5.1 mmol/L 4.5 - 4.4  Chloride 101 - 111 mmol/L 96(L) - 102  CO2 22 - 32 mmol/L 40(H) - 37(H)  Calcium 8.9 - 10.3 mg/dL  8.8(L) - 8.6(L)    Hepatic Function Latest Ref Rng & Units 01/03/2017 09/28/2016 10/22/2015  Total Protein 6.5 - 8.1 g/dL 6.9 8.0 6.2(L)  Albumin 3.5 - 5.0 g/dL 3.2(L) 4.1 3.6  AST 15 - 41 U/L 10(L) 16 14(L)  ALT 14 - 54 U/L 9(L) 9(L) 12(L)  Alk Phosphatase 38 - 126 U/L 77 131(H) 55  Total Bilirubin 0.3 - 1.2 mg/dL <0.1(L) 0.5 0.7    IMAGING/STUDIES: TTE 10/13/16:  LV with mild concentric hypertrophy & EF 50-55%. Unable to assess diastolic function. No wall motion abnormalities. LA & RA normal in size. RV normal in size and function. No aortic stenosis or regurgitation. Aortic root normal in size. No mitral stenosis or regurgitation. No pulmonic stenosis or regurgitation. No tricuspid regurgitation. No pericardial effusion. EEG 7/20:  No seizures.  CT HEAD W/O 7/20:  No acute abnormality. Chronic microvascular ischemic change and advanced for age cortical atrophy.  Atherosclerosis. Chronic bilateral mastoid effusions. PORT CXR 7/22: Persistent blunting of bilateral costophrenic angles with hazy opacification. No pleural effusion appreciated. Right upper extremity PICC line in place. Endotracheal tube and enteric feeding tube in place. PORT CXR 7/24:  Right upper extremity PICC line in place. Persistent bilateral basilar opacities right greater than left. No new focal opacity appreciated. RENAL US 7/24: marked left sided hydronephrosis, etiology not demonstrated on the present exam, renal parenchymal thinning, evaluation of the left renal  parenchyma is limited by habitus and bowel gas, only right ureteral jet was visualized CT RENAL STUDY 7/25 >>  MICROBIOLOGY: MRSA PCR 7/17:  Negative  Blood Cultures x2 7/17:  Negative  Tracheal Aspirate Culture 7/18:  MRSA & Stenotrophomonas maltophilia Urine Streptococcal Antigen 7/17:  Negative   ANTIBIOTICS: Cefepime 7/16 - 7/21 Vancomycin 7/16 >>> Fortaz 7/22 >>>  SIGNIFICANT EVENTS: 07/17 - Admit 07/18 - Intubated for worsening respiratory  status 07/23 - Self extubated & code status clarified w/ son & other family as DNR/DNI 07/24 - Tx to floor, [facility] service  ASSESSMENT/PLAN:  66 y.o. Female with acute on chronic hypoxic respiratory failure and pneumonia due to MRSA and stenotrophomonas. Delirium is slowly improving. Patient's respiratory status overall seems to be stable.  1. Acute on chronic hypoxic respiratory failure: in setting of MRSA/stenotrophomonas PNA, wean O2 for sats > 90%, pulmonary hygiene with IS / mobilization, reduce Lasix IV to 20 mg QD   2. Sepsis: secondary to PNA, abx as above.    3. MRSA & stenotrophomonas pneumonia: ID following, defer antibiotic management to ID.  Follow CXR intermittently. Will need repeat imaging in 4-6 wks to ensure clearance of airspace disease.   4. COPD with exacerbation: reduce solumedrol to 20 mg IV QD, consider rapid reduction to off given delirium.  Continue pulmicort BID and Duoneb Q6  5. Acute encephalopathy: suspect in setting of acute critical illness / toxic metabolic encephalopathy.  Concern steroids may also be playing a role.  Continue nocturnal seroquel, limit sedating medications.   6. Acute on chronic diastolic congestive heart failure:  Continue lasix, lopressor 12.5 BID, telemetry monitoring   7. Myoclonic jerking: multifactorial, continue home neurontin, effexor.   8. Hypokalemia:  Monitor BMP, replace lytes as indicated 9. Anemia: without evidence of bleeding, Hgb stable, monitor CBC  10. Diabetes mellitus: CBG ACHS, moderate SSI  11. Hematuria: renal US with hydronephrosis, no source identified.  Assess CT Renal study, may need Urology input pending findings.  Defer further work up to primary MD.    Prophylaxis:  SCDs, Lovenox [place] daily, & Protonix PO daily. Diet:  Diet as tolerated Code Status:  DNR / DNI Disposition: Medical floor, [facility] service. Family Update:  Son updated at bedside 7/25 on NP rounding   Noe Gens, NP-C [facility] Pgr: [phone] or if no answer [phone] 01/11/2017, 9:44 AM

## 2017-01-11 NOTE — Progress Notes (Signed)
CSW spoke with patient's son at bedside and provided with bed offers. Patient's son reported that he was going to look at a few places and get back with CSW. CSW will continue to follow and assist with discharge planning.   Abundio Miu, [place] Social Worker [facility] Cell#: [phone]

## 2017-01-11 NOTE — Progress Notes (Addendum)
PROGRESS NOTE  DANYIEL CRESPIN GUY:[id number] DOB: 01-01-1951 DOA: 01/03/2017 PCP: Garwin Brothers, MD  Brief Narrative: 66 year old woman PMH COPD, chronic respiratory failure on home oxygen, diabetes, dementia presented to the emergency department with shortness of breath, hypoxia. Placed on BiPAP, treated with antibiotics and referred for admission for respiratory acidosis, pneumonia, COPD exacerbation, acute respiratory failure. She decompensated, was transferred to critical care and intubated. Treated for MRSA and stenotrophomonas pneumonia. Subsequently extubated. Ongoing care for pneumonia, acute heart failure, sepsis, COPD exacerbation, acute encephalopathy.  Assessment/Plan #1 Acute hypoxic, hypercapnic respiratory failure, respiratory acidosis, superimposed on chronic hypoxic respiratory failure; secondary to lobar pneumonia, COPD exacerbation.  -Appears stable on the medical floor on high flow nasal cannula. Appreciate pulmonology involvement. -Continue antibiotics as per ID  #2 Sepsis secondary to lobar pneumonia, MRSA and stenotrophomonas -Sepsis appears resolved. Continue antibiotics as above.  #3 COPD exacerbation, COPD GOLD IV, chronic hypoxic respiratory failure -Continue bronchodilators, steroids  #4 Acute diastolic congestive heart failure -Appears euvolemic. Change to oral Lasix.   #5 Acute metabolic encephalopathy superimposed on dementia with associated myoclonic jerks. CT head no acute disease. EEG suggested metabolic encephalopathy. No seizure activity. -Appears to be improving spontaneously   #6 Gross hematuria, acute. Lovenox discontinued. -Hemoglobin stable. Renal ultrasound noted. CT pending to rule out stone.   #7 Marked left-sided hydronephrosis of unclear etiology and chronicity. Renal function preserved. -Follow-up CT  #8 Diabetes mellitus with peripheral neuropathy -stable. Continue SSI.  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 D/w Dr. Alinda Money, he will see in consult, requests CT abd/pelvis with contrast to fully evaluation upper tract and better characterize the mass.  DVT prophylaxis: SCDs Code Status: DNR/DNI Family Communication: son at bedside Disposition Plan: pending    Murray Hodgkins, MD  [facility] Direct contact: [phone] --[place] [place]  --[email or web address]; password [email or web address]  7PM-7AM contact night coverage as above 01/11/2017, 10:56 AM  LOS: 8 days   Consultants:  Pulmonary critical care medicine   Procedures:  ETT 7/18 >> 7/23  EEG Clinical Interpretation: This EEG is consistent with a moderate to severe generalized non-specific cerebral dysfunction(encephalopathy). Though non-specific, triphasic waves can suggest metabolic cause of encephalopathy. There was no seizure or seizure predisposition recorded on this study.   Antimicrobials:  Cefepime 7/17 >> 7/21   Ceftazidime 7/22 >>   Vancomycin 7/17 >>  Interval history/Subjective: Feels okay. Breathing okay. Has chronic back pain without change.  Objective: Vitals: Afebrile, 98.7, 20, 77, 134/75, 91% on 9 L high flow nasal cannula   Exam:     Constitutional: Appears anxious but comfortable, nontoxic.  Eyes. Pupils, irises, lids appear unremarkable.  ENT. Hard of hearing. Lips appear unremarkable.  Cardiovascular. Regular rate and rhythm. No murmur, rub or gallop. No lower extremity edema. Neck   Respiratory. Decreased breath sounds bilaterally. No frank wheezes, rales or rhonchi. Normal respiratory effort.  Abdomen. Soft, nondistended, appears unremarkable. Generally tender to palpation. Bilateral CVA tenderness noted.  Skin. No rash or induration noted.  Psychiatric. Anxious, difficult to assess affect. Speech fluent and clear.   I have personally reviewed the following:   Labs:  Basic metabolic panel unremarkable  CBC without significant change, hemoglobin 10.9, WBC 15.5.  Imaging  studies:  Chest x-ray independently reviewed by basilar infiltrates without significant change  Renal ultrasound with marked left-sided hydronephrosis, etiology unclear, chronicity unclear  Medical tests:     Test discussed with performing physician:    Decision to obtain old records:  Review and summation of old records:    Scheduled Meds: . budesonide (PULMICORT) nebulizer solution  0.5 mg Nebulization BID  . chlorhexidine gluconate (MEDLINE KIT)  15 mL Mouth Rinse BID  . Chlorhexidine Gluconate Cloth  6 each Topical Daily  . [START ON 01/12/2017] furosemide  20 mg Oral Daily  . gabapentin  200 mg Oral Q6H  . insulin aspart  0-15 Units Subcutaneous TID WC  . insulin aspart  0-5 Units Subcutaneous QHS  . ipratropium-albuterol  3 mL Nebulization Q6H  . mouth rinse  15 mL Mouth Rinse QID  . methylPREDNISolone (SOLU-MEDROL) injection  40 mg Intravenous Daily  . metoprolol tartrate  12.5 mg Oral BID  . mupirocin ointment  1 application Nasal BID  . oxybutynin  5 mg Oral Q breakfast  . pantoprazole  20 mg Oral Daily  . polyethylene glycol  17 g Oral BID  . QUEtiapine  300 mg Oral QHS  . senna  1 tablet Oral BID  . sodium chloride flush  10-40 mL Intracatheter Q12H  . sodium chloride flush  3 mL Intravenous Q12H  . venlafaxine  50 mg Oral BID WC   Continuous Infusions: . cefTAZidime (FORTAZ)  IV Stopped (01/10/17 2305)  . vancomycin 1,000 mg (01/11/17 1000)    Principal Problem:   Acute respiratory failure with hypoxia and hypercapnia (HCC) Active Problems:   COPD exacerbation (HCC)   Diabetes mellitus type 2 in nonobese (HCC)   Stage 4 very severe COPD by GOLD classification ([place])   Lobar pneumonia (HCC)   Chronic respiratory failure with hypoxia (HCC)   Acute respiratory acidosis   Normocytic anemia   Gross hematuria   Hydronephrosis, left   LOS: 8 days

## 2017-01-11 NOTE — Progress Notes (Signed)
CSW contacted patient's son to provide bed offers, no answer. CSW left voicemail requesting return phone call. CSW will attempt to contact again.   Abundio Miu, [place] Social Worker [facility] Cell#: [phone]

## 2017-01-11 NOTE — Consult Note (Signed)
Urology Consult   Physician requesting consult: Dr. Sarajane Jews  Reason for consult: Left hydronephrosis, hematuria, and pelvic mass  History of Present Illness: Traci Thomas is a 66 y.o. who was admitted to the hospital on 01/03/17 with acute respiratory distress requiring intubation.  She has a history of COPD and is on home oxygen and was recently treated for pneumonia.  She has a history of a few months of left flank pain according to her son and she was noted to have gross hematuria during her current hospitalization.  A non-contrast CT of the abdomen and pelvis was performed yesterday and demonstrated a left sided pelvic mass with extension into the bladder and left ureterovesical junction causing left ureteral obstruction with severe hydronephrosis and very thinned left renal parenchyma suggesting this has been a chronic process.  Her urine is currently clear.  Her pain is controlled with medication.  She denies a history of voiding or storage urinary symptoms, hematuria, UTIs, STDs, urolithiasis, GU malignancy/trauma/surgery.  Past Medical History:  Diagnosis Date  . Asthma   . Back pain   . COPD (chronic obstructive pulmonary disease) (HCC)    Home O2 3lpm, theophylline  . Diabetes mellitus without complication [facility])     Past Surgical History:  Procedure Laterality Date  . APPENDECTOMY    . FEMUR IM NAIL Right 10/21/2015   Procedure: INTRAMEDULLARY (IM) NAIL FEMORAL;  Surgeon: Meredith Pel, MD;  Location: [facility];  Service: Orthopedics;  Laterality: Right;  . TONSILLECTOMY       Current Hospital Medications:  Home meds:  Current Meds  Medication Sig  . acetaminophen (TYLENOL) 325 MG tablet Take 650 mg by mouth every 4 (four) hours as needed for fever.  Marland Kitchen albuterol (PROVENTIL HFA;VENTOLIN HFA) 108 (90 BASE) MCG/ACT inhaler Inhale 2 puffs into the lungs every 6 (six) hours as needed for wheezing or shortness of breath.  Marland Kitchen albuterol (PROVENTIL) (2.5 MG/3ML) 0.083% nebulizer  solution Take 3 mLs (2.5 mg total) by nebulization every 4 (four) hours as needed for wheezing or shortness of breath.  . ALPRAZolam (XANAX) 0.5 MG tablet Take 1 tablet (0.5 mg total) by mouth 3 (three) times daily as needed for anxiety. (Patient taking differently: Take 0.5 mg by mouth every 6 (six) hours. )  . azithromycin (ZITHROMAX) 500 MG tablet Take 500 mg by mouth daily.  . CEFTAZIDIME IV Inject 1 application into the vein every 8 (eight) hours.  Marland Kitchen desvenlafaxine (PRISTIQ) 100 MG 24 hr tablet Take 100 mg by mouth daily with breakfast.   . fluticasone (FLONASE) 50 MCG/ACT nasal spray Place 2 sprays into both nostrils daily.  . fluticasone furoate-vilanterol (BREO ELLIPTA) 200-25 MCG/INH AEPB Inhale 1 puff into the lungs daily after breakfast.   . furosemide (LASIX) 20 MG tablet Take 20 mg by mouth daily.  Marland Kitchen gabapentin (NEURONTIN) 100 MG capsule Take 200 mg by mouth 4 (four) times daily.   Marland Kitchen guaiFENesin (ROBITUSSIN) 100 MG/5ML liquid Take 200 mg by mouth every 4 (four) hours as needed for cough.  Marland Kitchen HYDROcodone-acetaminophen (NORCO/VICODIN) 5-325 MG tablet Take 1 tablet by mouth every 6 (six) hours as needed for moderate pain. (Patient taking differently: Take 1 tablet by mouth every 6 (six) hours. )  . hydrocortisone 2.5 % cream Apply 1 application topically 3 (three) times daily. Apply to face  . hydroxypropyl methylcellulose / hypromellose (ISOPTO TEARS / GONIOVISC) 2.5 % ophthalmic solution Place 1 drop into both eyes every 12 (twelve) hours as needed for dry eyes.  Marland Kitchen  insulin aspart (NOVOLOG) 100 UNIT/ML injection Inject 0-15 Units into the skin 3 (three) times daily with meals. Glucose 150 to 200 use 2 units, for 201 to 250 use 4 units, for 251 to 300 use 6 units, for 301 to 350 use 8 units, for 351 or greater use 10 units. (Patient taking differently: Inject 0-10 Units into the skin 3 (three) times daily with meals. Glucose 151 to 200 use 2 units, for 201 to 250 use 4 units, for 251 to 300 use  6 units, for 301 to 350 use 8 units, for 351 or greater use 10 units. Call MD if over 400)  . ipratropium-albuterol (DUONEB) 0.5-2.5 (3) MG/3ML SOLN Take 3 mLs by nebulization 4 (four) times daily.  Marland Kitchen levocetirizine (XYZAL) 5 MG tablet Take 5 mg by mouth every evening.  . magnesium hydroxide (MILK OF MAGNESIA) 400 MG/5ML suspension Take 30 mLs by mouth as needed for mild constipation.  . Menthol, Topical Analgesic, (ICY HOT EX) Apply 1 patch topically daily. Leave on for 8 hours  . omeprazole (PRILOSEC) 20 MG capsule Take 20 mg by mouth daily before breakfast.  . oxybutynin (DITROPAN-XL) 5 MG 24 hr tablet Take 5 mg by mouth daily with breakfast.  . OXYGEN Inhale 6 L/min into the lungs continuous.  . predniSONE (DELTASONE) 20 MG tablet Take 2 tablets (40 mg total) by mouth daily with breakfast. Take 2 tablets daily for 2 days, then 1 tablet daily for 2 days, then half tablet daily for 2 days. (Patient taking differently: Take 40 mg by mouth daily with breakfast. )  . prochlorperazine (COMPAZINE) 10 MG tablet Take 10 mg by mouth every 4 (four) hours as needed for nausea or vomiting.  Marland Kitchen QUEtiapine (SEROQUEL) 300 MG tablet Take 300 mg by mouth at bedtime.  Marland Kitchen QUEtiapine (SEROQUEL) 50 MG tablet Take 50 mg by mouth daily with breakfast.  . senna-docusate (SENOKOT-S) 8.6-50 MG tablet Take 2 tablets by mouth 2 (two) times daily.  . theophylline (THEODUR) 300 MG 12 hr tablet Take 300 mg by mouth 2 (two) times daily.  Marland Kitchen tiotropium (SPIRIVA HANDIHALER) 18 MCG inhalation capsule Place 1 capsule (18 mcg total) into inhaler and inhale daily.  . Vitamin D, Ergocalciferol, (DRISDOL) 50000 units CAPS capsule Take 50,000 Units by mouth every Sunday.     Scheduled Meds: . budesonide (PULMICORT) nebulizer solution  0.5 mg Nebulization BID  . chlorhexidine gluconate (MEDLINE KIT)  15 mL Mouth Rinse BID  . Chlorhexidine Gluconate Cloth  6 each Topical Daily  . [START ON 01/12/2017] furosemide  20 mg Oral Daily  .  gabapentin  200 mg Oral Q6H  . insulin aspart  0-15 Units Subcutaneous TID WC  . insulin aspart  0-5 Units Subcutaneous QHS  . ipratropium-albuterol  3 mL Nebulization Q6H  . mouth rinse  15 mL Mouth Rinse QID  . methylPREDNISolone (SOLU-MEDROL) injection  40 mg Intravenous Daily  . metoprolol tartrate  12.5 mg Oral BID  . mupirocin ointment  1 application Nasal BID  . oxybutynin  5 mg Oral Q breakfast  . pantoprazole  20 mg Oral Daily  . polyethylene glycol  17 g Oral BID  . QUEtiapine  300 mg Oral QHS  . senna  1 tablet Oral BID  . sodium chloride flush  10-40 mL Intracatheter Q12H  . sodium chloride flush  3 mL Intravenous Q12H  . venlafaxine  50 mg Oral BID WC   Continuous Infusions: . cefTAZidime (FORTAZ)  IV Stopped (01/11/17 1301)  .  vancomycin Stopped (01/11/17 1100)   PRN Meds:.acetaminophen **OR** acetaminophen, ALPRAZolam, bisacodyl, hydrALAZINE, ipratropium-albuterol, sodium chloride flush  Allergies:  Allergies  Allergen Reactions  . Adhesive [Tape] Other (See Comments)    Skin peels off   . Ciprofloxacin     Unknown reaction   . Latex     Unknown reaction   . Prednisone Nausea And Vomiting    Family History  Problem Relation Age of Onset  . COPD Mother   . Diabetes Unknown     Social History:  reports that she quit smoking about 2 years ago. Her smoking use included Cigarettes. She smoked 1.00 pack per day. She has never used smokeless tobacco. She reports that she drinks alcohol. She reports that she does not use drugs.  ROS: A complete review of systems was performed.  All systems are negative except for pertinent findings as noted.  Physical Exam:  Vital signs in last 24 hours: Temp:  [98.6 F (37 C)-99.1 F (37.3 C)] 99.1 F (37.3 C) (07/25 1452) Pulse Rate:  [77-87] 79 (07/25 1452) Resp:  [16-20] 20 (07/25 1452) BP: (124-134)/(75-81) 128/81 (07/25 1452) SpO2:  [90 %-98 %] 92 % (07/25 1452) Weight:  [64.4 kg (141 lb 15.6 oz)] 64.4 kg (141 lb  15.6 oz) (07/25 0500) Constitutional:  Alert and oriented, No acute distress Cardiovascular: Regular rate and rhythm, No JVD Respiratory: Decreased breath sounds, Lungs clear bilaterally GI: Abdomen is soft, nondistended, no abdominal masses, moderated left CVAT and left abdominal tenderness GU: Urine grossly clear now Lymphatic: No lymphadenopathy Neurologic: Grossly intact, no focal deficits Psychiatric: Normal mood and affect  Laboratory Data:   Recent Labs  01/10/17 0350 01/11/17 0833  WBC 13.3* 15.5*  HGB 9.5* 10.9*  HCT 32.0* 36.6  PLT 196 234     Recent Labs  01/09/17 0506 01/10/17 0350 01/11/17 0833  NA  --  144 145  K  --  4.5 3.5  CL  --  96* 92*  GLUCOSE  --  141* 152*  BUN  --  29* 20  CALCIUM  --  8.8* 9.5  CREATININE 0.86 0.77 0.87     Results for orders placed or performed during the hospital encounter of 01/03/17 (from the past 24 hour(s))  Glucose, capillary     Status: Abnormal   Collection Time: 01/10/17 10:25 PM  Result Value Ref Range   Glucose-Capillary 142 (H) 65 - 99 mg/dL   Comment 1 Notify RN   Glucose, capillary     Status: Abnormal   Collection Time: 01/11/17  7:51 AM  Result Value Ref Range   Glucose-Capillary 109 (H) 65 - 99 mg/dL  CBC with Differential/Platelet     Status: Abnormal   Collection Time: 01/11/17  8:33 AM  Result Value Ref Range   WBC 15.5 (H) 4.0 - 10.5 K/uL   RBC 3.92 3.87 - 5.11 MIL/uL   Hemoglobin 10.9 (L) 12.0 - 15.0 g/dL   HCT 35.9 40.9 - 05.0 %   MCV 93.4 78.0 - 100.0 fL   MCH 27.8 26.0 - 34.0 pg   MCHC 29.8 (L) 30.0 - 36.0 g/dL   RDW 25.6 (H) 15.4 - 88.4 %   Platelets 234 150 - 400 K/uL   Neutrophils Relative % 79 %   Neutro Abs 12.3 (H) 1.7 - 7.7 K/uL   Lymphocytes Relative 12 %   Lymphs Abs 1.8 0.7 - 4.0 K/uL   Monocytes Relative 8 %   Monocytes Absolute 1.3 (H) 0.1 - 1.0 K/uL  Eosinophils Relative 1 %   Eosinophils Absolute 0.2 0.0 - 0.7 K/uL   Basophils Relative 0 %   Basophils Absolute 0.0  0.0 - 0.1 K/uL  Magnesium     Status: None   Collection Time: 01/11/17  8:33 AM  Result Value Ref Range   Magnesium 2.1 1.7 - 2.4 mg/dL  Renal function panel     Status: Abnormal   Collection Time: 01/11/17  8:33 AM  Result Value Ref Range   Sodium 145 135 - 145 mmol/L   Potassium 3.5 3.5 - 5.1 mmol/L   Chloride 92 (L) 101 - 111 mmol/L   CO2 43 (H) 22 - 32 mmol/L   Glucose, Bld 152 (H) 65 - 99 mg/dL   BUN 20 6 - 20 mg/dL   Creatinine, Ser 0.87 0.44 - 1.00 mg/dL   Calcium 9.5 8.9 - 10.3 mg/dL   Phosphorus 3.2 2.5 - 4.6 mg/dL   Albumin 3.7 3.5 - 5.0 g/dL   GFR calc non Af Amer >60 >60 mL/min   GFR calc Af Amer >60 >60 mL/min   Anion gap 10 5 - 15  Glucose, capillary     Status: Abnormal   Collection Time: 01/11/17 12:03 PM  Result Value Ref Range   Glucose-Capillary 218 (H) 65 - 99 mg/dL  Glucose, capillary     Status: Abnormal   Collection Time: 01/11/17  4:20 PM  Result Value Ref Range   Glucose-Capillary 154 (H) 65 - 99 mg/dL   Recent Results (from the past 240 hour(s))  Culture, blood (Routine x 2)     Status: None   Collection Time: 01/03/17  4:30 AM  Result Value Ref Range Status   Specimen Description BLOOD BLOOD LEFT HAND  Final   Special Requests   Final    BOTTLES DRAWN AEROBIC AND ANAEROBIC Blood Culture results may not be optimal due to an excessive volume of blood received in culture bottles   Culture   Final    NO GROWTH 5 DAYS Performed at [facility], [address], [place], [place] [address]    Report Status 01/08/2017 FINAL  Final  Culture, blood (Routine x 2)     Status: None   Collection Time: 01/03/17  4:46 AM  Result Value Ref Range Status   Specimen Description BLOOD BLOOD RIGHT FOREARM  Final   Special Requests   Final    BOTTLES DRAWN AEROBIC AND ANAEROBIC Blood Culture results may not be optimal due to an excessive volume of blood received in culture bottles   Culture   Final    NO GROWTH 5 DAYS Performed at [facility],  [place] [address], [place], [place] [address]    Report Status 01/08/2017 FINAL  Final  MRSA PCR Screening     Status: None   Collection Time: 01/03/17  8:08 AM  Result Value Ref Range Status   MRSA by PCR NEGATIVE NEGATIVE Final    Comment:        The GeneXpert MRSA Assay (FDA approved for NASAL specimens only), is one component of a comprehensive MRSA colonization surveillance program. It is not intended to diagnose MRSA infection nor to guide or monitor treatment for MRSA infections.   Culture, sputum-assessment     Status: None   Collection Time: 01/03/17  9:45 AM  Result Value Ref Range Status   Specimen Description EXPECTORATED SPUTUM  Final   Special Requests NONE  Final   Sputum evaluation THIS SPECIMEN IS ACCEPTABLE FOR  SPUTUM CULTURE  Final   Report Status 01/03/2017 FINAL  Final  Culture, respiratory (NON-Expectorated)     Status: None   Collection Time: 01/03/17  9:45 AM  Result Value Ref Range Status   Specimen Description EXPECTORATED SPUTUM  Final   Special Requests NONE Reflexed from [id number]  Final   Gram Stain   Final    ABUNDANT WBC PRESENT, PREDOMINANTLY PMN RARE GRAM POSITIVE COCCI RARE GRAM POSITIVE RODS RARE YEAST Performed at [facility], [address], [place], [place] [address]    Culture RARE METHICILLIN RESISTANT STAPHYLOCOCCUS AUREUS  Final   Report Status 01/05/2017 FINAL  Final   Organism ID, Bacteria METHICILLIN RESISTANT STAPHYLOCOCCUS AUREUS  Final      Susceptibility   Methicillin resistant staphylococcus aureus - MIC*    CIPROFLOXACIN >=8 RESISTANT Resistant     ERYTHROMYCIN >=8 RESISTANT Resistant     GENTAMICIN <=0.5 SENSITIVE Sensitive     OXACILLIN >=4 RESISTANT Resistant     TETRACYCLINE <=1 SENSITIVE Sensitive     VANCOMYCIN <=0.5 SENSITIVE Sensitive     TRIMETH/SULFA <=10 SENSITIVE Sensitive     CLINDAMYCIN <=0.25 SENSITIVE Sensitive     RIFAMPIN <=0.5 SENSITIVE Sensitive     Inducible Clindamycin NEGATIVE Sensitive     *  RARE METHICILLIN RESISTANT STAPHYLOCOCCUS AUREUS  Culture, respiratory (NON-Expectorated)     Status: None   Collection Time: 01/04/17  5:33 PM  Result Value Ref Range Status   Specimen Description TRACHEAL ASPIRATE  Final   Special Requests NONE  Final   Gram Stain   Final    ABUNDANT WBC PRESENT,BOTH PMN AND MONONUCLEAR NO ORGANISMS SEEN Performed at [facility], [address], [place], [place] [address]    Culture   Final    RARE METHICILLIN RESISTANT STAPHYLOCOCCUS AUREUS RARE STENOTROPHOMONAS MALTOPHILIA    Report Status 01/08/2017 FINAL  Final   Organism ID, Bacteria METHICILLIN RESISTANT STAPHYLOCOCCUS AUREUS  Final   Organism ID, Bacteria STENOTROPHOMONAS MALTOPHILIA  Final      Susceptibility   Methicillin resistant staphylococcus aureus - MIC*    CIPROFLOXACIN >=8 RESISTANT Resistant     ERYTHROMYCIN >=8 RESISTANT Resistant     GENTAMICIN <=0.5 SENSITIVE Sensitive     OXACILLIN >=4 RESISTANT Resistant     TETRACYCLINE <=1 SENSITIVE Sensitive     VANCOMYCIN <=0.5 SENSITIVE Sensitive     TRIMETH/SULFA <=10 SENSITIVE Sensitive     CLINDAMYCIN <=0.25 SENSITIVE Sensitive     RIFAMPIN <=0.5 SENSITIVE Sensitive     Inducible Clindamycin NEGATIVE Sensitive     * RARE METHICILLIN RESISTANT STAPHYLOCOCCUS AUREUS   Stenotrophomonas maltophilia - MIC*    LEVOFLOXACIN >=8 RESISTANT Resistant     TRIMETH/SULFA >=320 RESISTANT Resistant     * RARE STENOTROPHOMONAS MALTOPHILIA    Renal Function:  Recent Labs  01/05/17 0320 01/06/17 0413 01/07/17 0514 01/08/17 0442 01/09/17 0506 01/10/17 0350 01/11/17 0833  CREATININE 1.19* 1.22* 1.09* 1.08* 0.86 0.77 0.87   Estimated Creatinine Clearance: 53.3 mL/min (by C-G formula based on SCr of 0.87 mg/dL).  Radiologic Imaging: US Renal  Result Date: 01/10/2017 CLINICAL DATA:  66 year old diabetic female with hematuria. Initial encounter. EXAM: RENAL / URINARY TRACT ULTRASOUND COMPLETE COMPARISON:  None. FINDINGS: Right  Kidney: Length: 11.5 cm. Echogenicity within normal limits. No mass or hydronephrosis visualized. Left Kidney: Length: 12.2 cm. Marked left-sided hydronephrosis. Etiology not demonstrated on the present exam. Renal parenchymal thinning. Evaluation of renal parenchyma is limited by patient's habitus and bowel gas. Bladder:  Only right ureteral jet was visualized.  No obvious mass. IMPRESSION: Marked left-sided hydronephrosis. Etiology not demonstrated on the present exam. Renal parenchymal thinning. Evaluation of left renal parenchyma is limited by patient's habitus and bowel gas. Only right ureteral jet was visualized. Electronically Signed   By: Genia Del M.D.   On: 01/10/2017 17:46   Dg Chest Port 1 View  Result Date: 01/10/2017 CLINICAL DATA:  Acute respiratory failure with hypoxia EXAM: PORTABLE CHEST 1 VIEW COMPARISON:  01/08/2017 FINDINGS: Endotracheal tube and NG tube removed. Right arm PICC tip in the SVC unchanged. Bibasilar airspace disease unchanged. Negative for heart failure or effusion IMPRESSION: Endotracheal tube and NG tube removed No change in bibasilar atelectasis/ infiltrate. Electronically Signed   By: Franchot Gallo M.D.   On: 01/10/2017 07:14   Ct Renal Stone Study  Result Date: 01/11/2017 CLINICAL DATA:  Gross hematuria. Left-sided hydronephrosis of unclear etiology and chronicity. History of COPD, diabetes and chronic respiratory failure. EXAM: CT ABDOMEN AND PELVIS WITHOUT CONTRAST TECHNIQUE: Multidetector CT imaging of the abdomen and pelvis was performed following the standard protocol without IV contrast. COMPARISON:  Renal ultrasound 01/10/2017.  Chest CT 10/24/2015. FINDINGS: Lower chest: Small right pleural effusion. There are right infrahilar airspace opacities within the right middle and lower lobes with associated volume loss and central airway narrowing. There is lesser patchy airspace disease in the left lower lobe. The lung bases are incompletely visualized. Coronary  artery calcifications are noted. Hepatobiliary: The liver appears unremarkable as imaged in the noncontrast state. No evidence of gallstones, gallbladder wall thickening or biliary dilatation. Pancreas: Unremarkable. No pancreatic ductal dilatation or surrounding inflammatory changes. Spleen: Normal in size without focal abnormality. Adrenals/Urinary Tract: Both adrenal glands appear normal. There is no evidence of urinary tract calculus. The right kidney appears unremarkable. The left kidney demonstrates marked cortical thinning and moderate hydronephrosis and hydroureter. The left ureter is dilated into the pelvis where there is a 7.1 x 4.9 cm mass (image 55), likely obstructing the ureter. The urinary bladder is distended. Contiguous with the left pelvic mass, there is dependent nodularity in the left bladder base near the trigone, measuring up to 20 mm on image 65. This could reflect tumor or blood clot. Stomach/Bowel: No evidence of bowel wall thickening, distention or surrounding inflammatory change. There are mild distal colonic diverticular changes. Vascular/Lymphatic: There are no discretely enlarged abdominal or pelvic lymph nodes. The left pelvic mass abuts the left femoral vasculature. Aortic and branch vessel atherosclerosis noted. Reproductive: Hysterectomy. Probable residual ovarian tissue on the right. The left ovary is not seen separate from the left adnexal mass described above. Other: No ascites or peritoneal nodularity. Musculoskeletal: No acute or significant osseous findings. Stable lower thoracic compression deformities. Previous proximal right femoral ORIF. IMPRESSION: 1. 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 The degree of hydronephrosis and cortical thinning imply chronicity. 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 Lymphoma consider less likely. 2. Nodularity within  the left bladder lumen could reflect distal extension of tumor or blood clot related to the patient's hematuria. Cystoscopy recommended. 3. No evidence of distant metastases or urinary tract calculus. 4. Bibasilar airspace opacities suspicious for chronic aspiration. Small right pleural effusion. Chest radiographic follow up recommended. 5.  Aortic Atherosclerosis (ICD10-I70.0). Electronically Signed   By: Richardean Sale M.D.   On: 01/11/2017 11:58    I independently reviewed the above imaging studies.  Impression/Recommendation:  1)  Gross hematuria and left pelvic mass/left ureteral obstruction:  I would recommend proceeding with a contrasted CT of abdomen and pelvis with delayed images to better define the patient's pelvic mass which could be originating from the bladder, distal left ureter, or ovary potentially.  This will also help complete evaluation of her upper urinary tract.  She ideally will need cystoscopy with left retrograde pyelography, left ureteroscopy with biopsy, and possible left ureteral stent.  However, she does not appear optimized from a pulmonary standpoint to undergo general anesthesia at this time.  It is unclear whether she would be a candidate for any major operative intervention based on her chronic pulmonary status either.  Will consider proceeding with further evaluation once optimized per pulmonary/internal medicine.  If there is thought that might be the case by next week, I certainly could consider further evaluation during this hospitalization, possibly early next week.  Jakylah Bassinger,LES 01/11/2017, 6:48 PM  Pryor Curia. MD   CC: Dr. Sarajane Jews

## 2017-01-11 NOTE — Progress Notes (Signed)
Pharmacy Antibiotic Note  Traci Thomas is a 66 y.o. female admitted on 01/03/2017 with pneumonia.  Pt has known hx of COPD, came from facility with fever and respiratory distress.  Pharmacy has been consulted for [facility] and Vancomycin dosing.  Today, 01/11/2017: - Tm Afebrile,  WBC spike to 13.3 on 7/24 - SCr improving- CrCl 55-65ml/min. - Vanc trough 34mcg/ml on 7/22 -  Stenotrophomonas maltophilia growing in trach aspirate. Resist to Bactrim and LVQ. Additional sensitivies require a send out & were never requested per micro lab.    Plan:  Continue Vancomycin 1g IV q24h.  Recheck Vanc trough with tomorrow's dose.  Follow up renal fxn, culture results, and clinical course.  Continue Ceftazidime 2g IV q12h.    F/U LOT- anticipate 14 days  Height: 4\' 11"  (149.9 cm) Weight: 141 lb 15.6 oz (64.4 kg) IBW/kg (Calculated) : 43.2  Temp (24hrs), Avg:98.6 F (37 C), Min:98.1 F (36.7 C), Max:98.9 F (37.2 C)   Recent Labs Lab 01/05/17 0320 01/05/17 0718 01/06/17 0413 01/07/17 0514 01/08/17 0442 01/08/17 0932 01/09/17 0506 01/10/17 0350  WBC 10.4  --  6.7 8.6 8.4  --   --  13.3*  CREATININE 1.19*  --  1.22* 1.09* 1.08*  --  0.86 0.77  VANCOTROUGH  --  16  --   --   --  17  --   --     Estimated Creatinine Clearance: 58 mL/min (by C-G formula based on SCr of 0.77 mg/dL).    Allergies  Allergen Reactions  . Adhesive [Tape] Other (See Comments)    Skin peels off   . Ciprofloxacin     Unknown reaction   . Latex     Unknown reaction   . Prednisone Nausea And Vomiting    Antimicrobials this admission: 7/17 Cefepime >> 7/21 7/17 Vancomycin >>  7/22 Ceftaz >>  Dose adjustments this admission: 7/19 0730 VT = 16 on vanc 500 q12 (SCr increasing, prior to 4th dose) 7/22 VT = 17 on 1g q24h, cont same  Microbiology results:  7/17 BCx: ngtd 7/17 MRSA PCR: (-) 7/17 Sputum (expectorated): rare MRSA (sens: vanc, doxy, bactrim) 7/18 Trach aspirate: rare MRSA (sens: vanc, doxy,  bactrim) and rare Stenotroph.maltophilia (res: LVQ, Bactrim)  Thank you for allowing pharmacy to be a part of this patient's care.  Netta Cedars, PharmD, BCPS Pager: [phone] . 01/11/2017,7:44 AM.

## 2017-01-12 ENCOUNTER — Inpatient Hospital Stay (HOSPITAL_COMMUNITY): Payer: Medicare Other

## 2017-01-12 DIAGNOSIS — R310 Gross hematuria: Secondary | ICD-10-CM

## 2017-01-12 DIAGNOSIS — J189 Pneumonia, unspecified organism: Secondary | ICD-10-CM

## 2017-01-12 LAB — CBC
HEMATOCRIT: 31.4 % — AB (ref 36.0–46.0)
Hemoglobin: 9.6 g/dL — ABNORMAL LOW (ref 12.0–15.0)
MCH: 27.4 pg (ref 26.0–34.0)
MCHC: 30.6 g/dL (ref 30.0–36.0)
MCV: 89.5 fL (ref 78.0–100.0)
PLATELETS: 229 10*3/uL (ref 150–400)
RBC: 3.51 MIL/uL — AB (ref 3.87–5.11)
RDW: 16.5 % — ABNORMAL HIGH (ref 11.5–15.5)
WBC: 12.3 10*3/uL — ABNORMAL HIGH (ref 4.0–10.5)

## 2017-01-12 LAB — GLUCOSE, CAPILLARY
Glucose-Capillary: 162 mg/dL — ABNORMAL HIGH (ref 65–99)
Glucose-Capillary: 286 mg/dL — ABNORMAL HIGH (ref 65–99)
Glucose-Capillary: 184 mg/dL — ABNORMAL HIGH (ref 65–99)
Glucose-Capillary: 80 mg/dL (ref 65–99)

## 2017-01-12 LAB — CREATININE, SERUM
CREATININE: 0.8 mg/dL (ref 0.44–1.00)
GFR calc Af Amer: >60 mL/min (ref >60)
GFR calc non Af Amer: >60 mL/min (ref >60)

## 2017-01-12 LAB — VANCOMYCIN, TROUGH: Vancomycin Tr: 14 ug/mL — ABNORMAL LOW (ref 15–20)

## 2017-01-12 MED ORDER — BOOST / RESOURCE BREEZE PO LIQD
1.0000 | Freq: Three times a day (TID) | ORAL | Status: DC
  Administered 2017-01-12 – 2017-01-15 (×4): 1 via ORAL

## 2017-01-12 MED ORDER — ADULT MULTIVITAMIN W/MINERALS CH
1.0000 | ORAL_TABLET | Freq: Every day | ORAL | Status: DC
  Administered 2017-01-12 – 2017-01-17 (×6): 1 via ORAL
  Filled 2017-01-12 (×6): qty 1

## 2017-01-12 MED ORDER — METHYLPREDNISOLONE SODIUM SUCC 40 MG IJ SOLR
20.0000 mg | Freq: Every day | INTRAMUSCULAR | Status: AC
  Administered 2017-01-13 – 2017-01-14 (×2): 20 mg via INTRAVENOUS
  Filled 2017-01-12 (×2): qty 1

## 2017-01-12 NOTE — Progress Notes (Signed)
PROGRESS NOTE  Traci Thomas  ION:[id number] DOB: Mar 06, 1951 DOA: 01/03/2017 PCP: Garwin Brothers, MD   Brief Narrative: 66 year old woman PMH COPD, chronic respiratory failure on home oxygen, diabetes, dementia presented to the emergency department with shortness of breath, hypoxia. Placed on BiPAP, treated with antibiotics and referred for admission for respiratory acidosis, pneumonia, COPD exacerbation, acute respiratory failure. She decompensated, was transferred to critical care and intubated. Treated for MRSA and stenotrophomonas pneumonia. Subsequently extubated. Ongoing care for pneumonia, acute heart failure, sepsis, COPD exacerbation, acute encephalopathy and newly diagnosed pelvic mass.  Assessment & Plan: Principal Problem:   Acute respiratory failure with hypoxia and hypercapnia (HCC) Active Problems:   COPD exacerbation (HCC)   Diabetes mellitus type 2 in nonobese (HCC)   Stage 4 very severe COPD by GOLD classification ([place])   Lobar pneumonia (HCC)   Chronic respiratory failure with hypoxia (HCC)   Acute respiratory acidosis   Normocytic anemia   Gross hematuria   Hydronephrosis of left kidney   HCAP (healthcare-associated pneumonia)  Acute on chronic hypoxic, hypercapnic respiratory failure: secondary to lobar MRSA and stenotrophomonas pneumonia and COPD exacerbation.  - Weaning IV steroids, no wheezing. Wean oxygen as able to baseline 3LPM.  - Appreciate pulmonology involvement. - Continue antibiotics as per ID: Vancomycin and ceftazidime- Monitor sensitivities testing for stenotrophomonas maltophila per ID recommendations. Clinically improving, so continuing ceftazidime (as 3rd line agent for stenotrophomonas which is resistant to levaquin and bactrim)  Sepsis secondary to lobar pneumonia, MRSA and stenotrophomonas - Sepsis appears resolved. Continue antibiotics as above.  COPD exacerbation, COPD GOLD IV, chronic hypoxic respiratory failure - Continue bronchodilators,  steroids (weaning IV steroids per PCCM)  Acute diastolic congestive heart failure - Appears euvolemic, wt down to 136lbs (139lbs at admission).  - Continue po lasix.  Acute metabolic encephalopathy superimposed on dementia with associated myoclonic jerks. CT head no acute disease. EEG suggested metabolic encephalopathy. No seizure activity. - Appears to be improving spontaneously, per son this is a recurrent problem.  Large enhancing pelvic mass lesion on the left: suspect left ovarian or transitional cell origin.  - Discussed at length with son. Prohibitively high risk from pulmonary perspective for biopsy at this time.   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  - Urology consulted, considering stenting - Continue monitoring creatinine  Gross hematuria, acute. Lovenox discontinued. -Hemoglobin stable. Renal ultrasound noted, no stone on CT  Diabetes mellitus with peripheral neuropathy - Higher CBGs in setting of steroids. Continue SSI.  Normocytic anemia, baseline - Stable  DVT prophylaxis: SCDs Code Status: DNR/DNI Family Communication: Son at bedside Disposition Plan: Eventual DC back to SNF pending clinical course  Consultants:  Pulmonary critical care medicine   Urology, Dr. Matilde Sprang  Procedures:  ETT 7/18 >> self-extubated 7/23  EEG Clinical Interpretation: This EEG is consistent with a moderate to severe generalized non-specific cerebral dysfunction(encephalopathy). Though non-specific, triphasic waves can suggest metabolic cause of encephalopathy.There was no seizure or seizure predisposition recorded on this study.   Antimicrobials:  Cefepime 7/17 >> 7/21   Ceftazidime 7/22 >>   Vancomycin 7/17 >>  Subjective: Pt and Son at bedside feels she is stable, possibly mildly improved from yesterday.   Objective: Vitals:   01/12/17 0455 01/12/17 0519 01/12/17 0813 01/12/17 1326  BP: (!) 145/77   (!) 163/88    Pulse: 82   87  Resp: 19   20  Temp: 99.1 F (37.3 C)   98.6 F (37 C)  TempSrc: Oral  Axillary  SpO2: 90%  92% 95%  Weight:  61.9 kg (136 lb 7.4 oz)    Height:        Intake/Output Summary (Last 24 hours) at 01/12/17 1432 Last data filed at 01/12/17 1245  Gross per 24 hour  Intake             1540 ml  Output             2500 ml  Net             -960 ml   Filed Weights   01/10/17 0306 01/11/17 0500 01/12/17 0519  Weight: 67.1 kg (147 lb 14.9 oz) 64.4 kg (141 lb 15.6 oz) 61.9 kg (136 lb 7.4 oz)    Examination: General exam: 66 y.o. female in no distress Respiratory system: Tachypneic on 6L by [place]. No wheezing or crackles. Cardiovascular system: Regular rate and rhythm. No murmur, rub, or gallop. No JVD, and no pedal edema. Gastrointestinal system: Abdomen soft, non-tender, non-distended, with normoactive bowel sounds. No organomegaly or masses felt. Central nervous system: Alert and disoriented, no agitation, not able to follow conversation. No focal neurological deficits. Extremities: Warm, no deformities Skin: No rashes, lesions no ulcers Psychiatry: Judgement and insight appear impaired. Mood & affect appropriate.   Data Reviewed: I have personally reviewed following labs and imaging studies  CBC:  Recent Labs Lab 01/07/17 0514 01/08/17 0442 01/10/17 0350 01/11/17 0833 01/12/17 0430  WBC 8.6 8.4 13.3* 15.5* 12.3*  NEUTROABS  --  7.0  --  12.3*  --   HGB 8.8* 8.8* 9.5* 10.9* 9.6*  HCT 28.8* 29.1* 32.0* 36.6 31.4*  MCV 92.3 93.3 93.6 93.4 89.5  PLT 235 206 196 234 846   Basic Metabolic Panel:  Recent Labs Lab 01/06/17 0413 01/07/17 0514 01/08/17 0442 01/09/17 0506 01/10/17 0350 01/11/17 0833 01/12/17 0430  NA 143 146* 147*  --  144 145  --   K 4.5 4.5 4.4  --  4.5 3.5  --   CL 104 103 102  --  96* 92*  --   CO2 31 35* 37*  --  40* 43*  --   GLUCOSE 193* 223* 252*  --  141* 152*  --   BUN 42* 44* 39*  --  29* 20  --   CREATININE 1.22* 1.09* 1.08*  0.86 0.77 0.87 0.80  CALCIUM 8.5* 8.5* 8.6*  --  8.8* 9.5  --   MG 2.3 2.1  --   --  2.1 2.1  --   PHOS 3.8 3.6  --   --   --  3.2  --    GFR: Estimated Creatinine Clearance: 56.9 mL/min (by C-G formula based on SCr of 0.8 mg/dL). Liver Function Tests:  Recent Labs Lab 01/11/17 0833  ALBUMIN 3.7   No results for input(s): LIPASE, AMYLASE in the last 168 hours. No results for input(s): AMMONIA in the last 168 hours. Coagulation Profile:  Recent Labs Lab 01/10/17 1447  INR 1.12   Cardiac Enzymes: No results for input(s): CKTOTAL, CKMB, CKMBINDEX, TROPONINI in the last 168 hours. BNP (last 3 results) No results for input(s): PROBNP in the last 8760 hours. HbA1C: No results for input(s): HGBA1C in the last 72 hours. CBG:  Recent Labs Lab 01/11/17 1203 01/11/17 1620 01/11/17 2107 01/12/17 0729 01/12/17 1146  GLUCAP 218* 154* 136* 162* 286*   Lipid Profile: No results for input(s): CHOL, HDL, LDLCALC, TRIG, CHOLHDL, LDLDIRECT in the last 72 hours. Thyroid Function Tests:  No results for input(s): TSH, T4TOTAL, FREET4, T3FREE, THYROIDAB in the last 72 hours. Anemia Panel: No results for input(s): VITAMINB12, FOLATE, FERRITIN, TIBC, IRON, RETICCTPCT in the last 72 hours. Urine analysis:    Component Value Date/Time   COLORURINE STRAW (A) 01/03/2017 1630   APPEARANCEUR CLEAR 01/03/2017 1630   LABSPEC 1.010 01/03/2017 1630   PHURINE 5.0 01/03/2017 1630   GLUCOSEU NEGATIVE 01/03/2017 1630   HGBUR SMALL (A) 01/03/2017 1630   BILIRUBINUR NEGATIVE 01/03/2017 1630   KETONESUR 5 (A) 01/03/2017 1630   PROTEINUR 30 (A) 01/03/2017 1630   UROBILINOGEN 0.2 03/26/2015 1146   NITRITE NEGATIVE 01/03/2017 1630   LEUKOCYTESUR NEGATIVE 01/03/2017 1630   Recent Results (from the past 240 hour(s))  Culture, blood (Routine x 2)     Status: None   Collection Time: 01/03/17  4:30 AM  Result Value Ref Range Status   Specimen Description BLOOD BLOOD LEFT HAND  Final   Special Requests    Final    BOTTLES DRAWN AEROBIC AND ANAEROBIC Blood Culture results may not be optimal due to an excessive volume of blood received in culture bottles   Culture   Final    NO GROWTH 5 DAYS Performed at [facility], [place] [address], [place], [place] [address]    Report Status 01/08/2017 FINAL  Final  Culture, blood (Routine x 2)     Status: None   Collection Time: 01/03/17  4:46 AM  Result Value Ref Range Status   Specimen Description BLOOD BLOOD RIGHT FOREARM  Final   Special Requests   Final    BOTTLES DRAWN AEROBIC AND ANAEROBIC Blood Culture results may not be optimal due to an excessive volume of blood received in culture bottles   Culture   Final    NO GROWTH 5 DAYS Performed at [facility], [place] [address], [place], [place] [address]    Report Status 01/08/2017 FINAL  Final  MRSA PCR Screening     Status: None   Collection Time: 01/03/17  8:08 AM  Result Value Ref Range Status   MRSA by PCR NEGATIVE NEGATIVE Final    Comment:        The GeneXpert MRSA Assay (FDA approved for NASAL specimens only), is one component of a comprehensive MRSA colonization surveillance program. It is not intended to diagnose MRSA infection nor to guide or monitor treatment for MRSA infections.   Culture, sputum-assessment     Status: None   Collection Time: 01/03/17  9:45 AM  Result Value Ref Range Status   Specimen Description EXPECTORATED SPUTUM  Final   Special Requests NONE  Final   Sputum evaluation THIS SPECIMEN IS ACCEPTABLE FOR SPUTUM CULTURE  Final   Report Status 01/03/2017 FINAL  Final  Culture, respiratory (NON-Expectorated)     Status: None   Collection Time: 01/03/17  9:45 AM  Result Value Ref Range Status   Specimen Description EXPECTORATED SPUTUM  Final   Special Requests NONE Reflexed from [id number]  Final   Gram Stain   Final    ABUNDANT WBC PRESENT, PREDOMINANTLY PMN RARE GRAM POSITIVE COCCI RARE GRAM POSITIVE RODS RARE YEAST Performed at [facility], [address], [place],  [address]    Culture RARE METHICILLIN RESISTANT STAPHYLOCOCCUS AUREUS  Final   Report Status 01/05/2017 FINAL  Final   Organism ID, Bacteria METHICILLIN RESISTANT STAPHYLOCOCCUS AUREUS  Final      Susceptibility   Methicillin resistant staphylococcus aureus - MIC*  CIPROFLOXACIN >=8 RESISTANT Resistant     ERYTHROMYCIN >=8 RESISTANT Resistant     GENTAMICIN <=0.5 SENSITIVE Sensitive     OXACILLIN >=4 RESISTANT Resistant     TETRACYCLINE <=1 SENSITIVE Sensitive     VANCOMYCIN <=0.5 SENSITIVE Sensitive     TRIMETH/SULFA <=10 SENSITIVE Sensitive     CLINDAMYCIN <=0.25 SENSITIVE Sensitive     RIFAMPIN <=0.5 SENSITIVE Sensitive     Inducible Clindamycin NEGATIVE Sensitive     * RARE METHICILLIN RESISTANT STAPHYLOCOCCUS AUREUS  Culture, respiratory (NON-Expectorated)     Status: None   Collection Time: 01/04/17  5:33 PM  Result Value Ref Range Status   Specimen Description TRACHEAL ASPIRATE  Final   Special Requests NONE  Final   Gram Stain   Final    ABUNDANT WBC PRESENT,BOTH PMN AND MONONUCLEAR NO ORGANISMS SEEN Performed at [facility], [address], [place], [place] [address]    Culture   Final    RARE METHICILLIN RESISTANT STAPHYLOCOCCUS AUREUS RARE STENOTROPHOMONAS MALTOPHILIA    Report Status 01/08/2017 FINAL  Final   Organism ID, Bacteria METHICILLIN RESISTANT STAPHYLOCOCCUS AUREUS  Final   Organism ID, Bacteria STENOTROPHOMONAS MALTOPHILIA  Final      Susceptibility   Methicillin resistant staphylococcus aureus - MIC*    CIPROFLOXACIN >=8 RESISTANT Resistant     ERYTHROMYCIN >=8 RESISTANT Resistant     GENTAMICIN <=0.5 SENSITIVE Sensitive     OXACILLIN >=4 RESISTANT Resistant     TETRACYCLINE <=1 SENSITIVE Sensitive     VANCOMYCIN <=0.5 SENSITIVE Sensitive     TRIMETH/SULFA <=10 SENSITIVE Sensitive     CLINDAMYCIN <=0.25 SENSITIVE Sensitive     RIFAMPIN <=0.5 SENSITIVE Sensitive     Inducible Clindamycin NEGATIVE  Sensitive     * RARE METHICILLIN RESISTANT STAPHYLOCOCCUS AUREUS   Stenotrophomonas maltophilia - MIC*    LEVOFLOXACIN >=8 RESISTANT Resistant     TRIMETH/SULFA >=320 RESISTANT Resistant     * RARE STENOTROPHOMONAS MALTOPHILIA      Radiology Studies: Ct Abdomen Pelvis W Contrast  Result Date: 01/11/2017 CLINICAL DATA:  Known left hydronephrosis and pelvic mass EXAM: CT ABDOMEN AND PELVIS WITH CONTRAST TECHNIQUE: Multidetector CT imaging of the abdomen and pelvis was performed using the standard protocol following bolus administration of intravenous contrast. CONTRAST:  100 mL Isovue-300 COMPARISON:  CT from earlier in the same day. FINDINGS: Lower chest: Small right pleural effusion and right lower lobe consolidation are again seen. Some right middle lobe consolidation is noted as well. Patchy infiltrate in the left lower lobe is also noted. Hepatobiliary: No focal liver abnormality is seen. No gallstones, gallbladder wall thickening, or biliary dilatation. Pancreas: Unremarkable. No pancreatic ductal dilatation or surrounding inflammatory changes. Spleen: Normal in size without focal abnormality. Adrenals/Urinary Tract: The adrenal glands are within normal limits. The right kidney demonstrates no renal calculi or obstructive changes. The left kidney again demonstrates significant hydronephrosis likely of the long-standing nature given the diffuse degree of cortical thinning. The hydronephrosis extends inferiorly and there is in growth into the mid to distal left ureter causing the obstructive change. This seen growth arises from the known pelvic mass lesion. Bladder is partially distended. An enhancing lesion is noted along the posterior aspect of the bladder near the trigone on the left. This is similar to that seen on the prior exam. Stomach/Bowel: No obstructive or inflammatory changes are noted. Scattered diverticular change is seen. The appendix is not visualize consistent with a prior surgical  history. Vascular/Lymphatic: The aorta demonstrates atherosclerotic  changes. Reproductive: Uterus is not well visualized and has likely been surgically removed. Other: Enhancing pelvic mass lesion is again identified which measures at least 6.3 by 4.8 cm. It extends for approximately 9.5 cm in craniocaudad projection. It causes occlusion of the mid to distal left ureter and show some extension along the common iliac artery on the left. There is also soft tissue surrounding the common iliac artery on the left related to the mass lesion. It also extends inferiorly towards the posterior wall of the bladder similar to that seen on the prior exam. Musculoskeletal: Postsurgical changes in the proximal right femur are noted. No definitive osseous metastatic lesions are seen. Degenerative change of the lumbar spine is noted. IMPRESSION: Large enhancing pelvic mass lesion on the left. Again this is likely of left ovarian or transitional cell origin. Occlusion of the left ureter is noted. There is also extension towards the posterior aspect of the bladder wall near the trigone and UVJ. The lesion in develops portions of the iliac vasculature on the left. Compression upon the left iliac venous structures is noted although known deep venous thrombosis is seen. Stable bibasilar changes with associated right-sided effusion. Aortic Atherosclerosis (ICD10-170.0) Electronically Signed   By: Inez Catalina M.D.   On: 01/11/2017 20:51   US Renal  Result Date: 01/10/2017 CLINICAL DATA:  66 year old diabetic female with hematuria. Initial encounter. EXAM: RENAL / URINARY TRACT ULTRASOUND COMPLETE COMPARISON:  None. FINDINGS: Right Kidney: Length: 11.5 cm. Echogenicity within normal limits. No mass or hydronephrosis visualized. Left Kidney: Length: 12.2 cm. Marked left-sided hydronephrosis. Etiology not demonstrated on the present exam. Renal parenchymal thinning. Evaluation of renal parenchyma is limited by patient's habitus and bowel  gas. Bladder: Only right ureteral jet was visualized.  No obvious mass. IMPRESSION: Marked left-sided hydronephrosis. Etiology not demonstrated on the present exam. Renal parenchymal thinning. Evaluation of left renal parenchyma is limited by patient's habitus and bowel gas. Only right ureteral jet was visualized. Electronically Signed   By: Genia Del M.D.   On: 01/10/2017 17:46   Dg Chest Port 1 View  Result Date: 01/12/2017 CLINICAL DATA:  Respiratory failure. EXAM: PORTABLE CHEST 1 VIEW COMPARISON:  01/10/2017 . FINDINGS: Right PICC line noted with tip projected over the superior vena cava. Heart size normal. Bibasilar atelectasis/ infiltrates unchanged. Small right pleural effusion. No pneumothorax. IMPRESSION: 1. Right PICC line stable position with tip projected superior vena cava. 2. Bibasilar atelectasis/ infiltrates unchanged. Small right pleural effusion . Electronically Signed   By: Marcello Moores  Register   On: 01/12/2017 07:03   Ct Renal Stone Study  Result Date: 01/11/2017 CLINICAL DATA:  Gross hematuria. Left-sided hydronephrosis of unclear etiology and chronicity. History of COPD, diabetes and chronic respiratory failure. EXAM: CT ABDOMEN AND PELVIS WITHOUT CONTRAST TECHNIQUE: Multidetector CT imaging of the abdomen and pelvis was performed following the standard protocol without IV contrast. COMPARISON:  Renal ultrasound 01/10/2017.  Chest CT 10/24/2015. FINDINGS: Lower chest: Small right pleural effusion. There are right infrahilar airspace opacities within the right middle and lower lobes with associated volume loss and central airway narrowing. There is lesser patchy airspace disease in the left lower lobe. The lung bases are incompletely visualized. Coronary artery calcifications are noted. Hepatobiliary: The liver appears unremarkable as imaged in the noncontrast state. No evidence of gallstones, gallbladder wall thickening or biliary dilatation. Pancreas: Unremarkable. No pancreatic  ductal dilatation or surrounding inflammatory changes. Spleen: Normal in size without focal abnormality. Adrenals/Urinary Tract: Both adrenal glands appear normal. There is no  evidence of urinary tract calculus. The right kidney appears unremarkable. The left kidney demonstrates marked cortical thinning and moderate hydronephrosis and hydroureter. The left ureter is dilated into the pelvis where there is a 7.1 x 4.9 cm mass (image 55), likely obstructing the ureter. The urinary bladder is distended. Contiguous with the left pelvic mass, there is dependent nodularity in the left bladder base near the trigone, measuring up to 20 mm on image 65. This could reflect tumor or blood clot. Stomach/Bowel: No evidence of bowel wall thickening, distention or surrounding inflammatory change. There are mild distal colonic diverticular changes. Vascular/Lymphatic: There are no discretely enlarged abdominal or pelvic lymph nodes. The left pelvic mass abuts the left femoral vasculature. Aortic and branch vessel atherosclerosis noted. Reproductive: Hysterectomy. Probable residual ovarian tissue on the right. The left ovary is not seen separate from the left adnexal mass described above. Other: No ascites or peritoneal nodularity. Musculoskeletal: No acute or significant osseous findings. Stable lower thoracic compression deformities. Previous proximal right femoral ORIF. IMPRESSION: 1. 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 The degree of hydronephrosis and cortical thinning imply chronicity. 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 Lymphoma consider less likely. 2. Nodularity within the left bladder lumen could reflect distal extension of tumor or blood clot related to the patient's hematuria. Cystoscopy recommended. 3. No evidence of distant metastases or urinary tract calculus. 4. Bibasilar airspace  opacities suspicious for chronic aspiration. Small right pleural effusion. Chest radiographic follow up recommended. 5.  Aortic Atherosclerosis (ICD10-I70.0). Electronically Signed   By: Richardean Sale M.D.   On: 01/11/2017 11:58    Scheduled Meds: . budesonide (PULMICORT) nebulizer solution  0.5 mg Nebulization BID  . chlorhexidine gluconate (MEDLINE KIT)  15 mL Mouth Rinse BID  . Chlorhexidine Gluconate Cloth  6 each Topical Daily  . feeding supplement  1 Container Oral TID BM  . furosemide  20 mg Oral Daily  . gabapentin  200 mg Oral Q6H  . insulin aspart  0-15 Units Subcutaneous TID WC  . insulin aspart  0-5 Units Subcutaneous QHS  . ipratropium-albuterol  3 mL Nebulization Q6H  . mouth rinse  15 mL Mouth Rinse QID  . [START ON 01/13/2017] methylPREDNISolone (SOLU-MEDROL) injection  20 mg Intravenous Daily  . metoprolol tartrate  12.5 mg Oral BID  . multivitamin with minerals  1 tablet Oral Daily  . oxybutynin  5 mg Oral Q breakfast  . pantoprazole  20 mg Oral Daily  . polyethylene glycol  17 g Oral BID  . QUEtiapine  300 mg Oral QHS  . senna  1 tablet Oral BID  . sodium chloride flush  10-40 mL Intracatheter Q12H  . sodium chloride flush  3 mL Intravenous Q12H  . venlafaxine  50 mg Oral BID WC   Continuous Infusions: . cefTAZidime (FORTAZ)  IV Stopped (01/12/17 1035)  . vancomycin Stopped (01/12/17 1132)     LOS: 9 days   Time spent: 25 minutes.  Vance Gather, MD [facility] Pager [phone]  If 7PM-7AM, please contact night-coverage [email or web address] Password [facility] 01/12/2017, 2:32 PM

## 2017-01-12 NOTE — Progress Notes (Signed)
Patient ID: Traci Thomas, female   DOB: May 04, 1951, 66 y.o.   MRN: [id number]    Subjective: Pt without new complaints today.  Left flank pain controlled.  Denies hematuria.  Objective: Vital signs in last 24 hours: Temp:  [98.5 F (36.9 C)-99.1 F (37.3 C)] 98.6 F (37 C) (07/26 1326) Pulse Rate:  [82-87] 87 (07/26 1326) Resp:  [19-20] 20 (07/26 1326) BP: (145-169)/(77-91) 163/88 (07/26 1326) SpO2:  [90 %-95 %] 95 % (07/26 1326) FiO2 (%):  [96 %-97 %] 96 % (07/26 1034) Weight:  [61.9 kg (136 lb 7.4 oz)] 61.9 kg (136 lb 7.4 oz) (07/26 0519)  Intake/Output from previous day: 07/25 0701 - 07/26 0700 In: 1040 [P.O.:940; IV Piggyback:100] Out: 2000 [Urine:2000] Intake/Output this shift: Total I/O In: 1750 [P.O.:1500; IV Piggyback:250] Out: 700 [Urine:700]  Physical Exam:  General: Alert and oriented   Lab Results:  Recent Labs  01/10/17 0350 01/11/17 0833 01/12/17 0430  HGB 9.5* 10.9* 9.6*  HCT 32.0* 36.6 31.4*   BMET  Recent Labs  01/10/17 0350 01/11/17 0833 01/12/17 0430  NA 144 145  --   K 4.5 3.5  --   CL 96* 92*  --   CO2 40* 43*  --   GLUCOSE 141* 152*  --   BUN 29* 20  --   CREATININE 0.77 0.87 0.80  CALCIUM 8.8* 9.5  --      Studies/Results: Ct Abdomen Pelvis W Contrast  Result Date: 01/11/2017 CLINICAL DATA:  Known left hydronephrosis and pelvic mass EXAM: CT ABDOMEN AND PELVIS WITH CONTRAST TECHNIQUE: Multidetector CT imaging of the abdomen and pelvis was performed using the standard protocol following bolus administration of intravenous contrast. CONTRAST:  100 mL Isovue-300 COMPARISON:  CT from earlier in the same day. FINDINGS: Lower chest: Small right pleural effusion and right lower lobe consolidation are again seen. Some right middle lobe consolidation is noted as well. Patchy infiltrate in the left lower lobe is also noted. Hepatobiliary: No focal liver abnormality is seen. No gallstones, gallbladder wall thickening, or biliary dilatation.  Pancreas: Unremarkable. No pancreatic ductal dilatation or surrounding inflammatory changes. Spleen: Normal in size without focal abnormality. Adrenals/Urinary Tract: The adrenal glands are within normal limits. The right kidney demonstrates no renal calculi or obstructive changes. The left kidney again demonstrates significant hydronephrosis likely of the long-standing nature given the diffuse degree of cortical thinning. The hydronephrosis extends inferiorly and there is in growth into the mid to distal left ureter causing the obstructive change. This seen growth arises from the known pelvic mass lesion. Bladder is partially distended. An enhancing lesion is noted along the posterior aspect of the bladder near the trigone on the left. This is similar to that seen on the prior exam. Stomach/Bowel: No obstructive or inflammatory changes are noted. Scattered diverticular change is seen. The appendix is not visualize consistent with a prior surgical history. Vascular/Lymphatic: The aorta demonstrates atherosclerotic changes. Reproductive: Uterus is not well visualized and has likely been surgically removed. Other: Enhancing pelvic mass lesion is again identified which measures at least 6.3 by 4.8 cm. It extends for approximately 9.5 cm in craniocaudad projection. It causes occlusion of the mid to distal left ureter and show some extension along the common iliac artery on the left. There is also soft tissue surrounding the common iliac artery on the left related to the mass lesion. It also extends inferiorly towards the posterior wall of the bladder similar to that seen on the prior exam. Musculoskeletal: Postsurgical changes  in the proximal right femur are noted. No definitive osseous metastatic lesions are seen. Degenerative change of the lumbar spine is noted. IMPRESSION: Large enhancing pelvic mass lesion on the left. Again this is likely of left ovarian or transitional cell origin. Occlusion of the left ureter is  noted. There is also extension towards the posterior aspect of the bladder wall near the trigone and UVJ. The lesion in develops portions of the iliac vasculature on the left. Compression upon the left iliac venous structures is noted although known deep venous thrombosis is seen. Stable bibasilar changes with associated right-sided effusion. Aortic Atherosclerosis (ICD10-170.0) Electronically Signed   By: Inez Catalina M.D.   On: 01/11/2017 20:51   Dg Chest Port 1 View  Result Date: 01/12/2017 CLINICAL DATA:  Respiratory failure. EXAM: PORTABLE CHEST 1 VIEW COMPARISON:  01/10/2017 . FINDINGS: Right PICC line noted with tip projected over the superior vena cava. Heart size normal. Bibasilar atelectasis/ infiltrates unchanged. Small right pleural effusion. No pneumothorax. IMPRESSION: 1. Right PICC line stable position with tip projected superior vena cava. 2. Bibasilar atelectasis/ infiltrates unchanged. Small right pleural effusion . Electronically Signed   By: Marcello Moores  Register   On: 01/12/2017 07:03   Ct Renal Stone Study  Result Date: 01/11/2017 CLINICAL DATA:  Gross hematuria. Left-sided hydronephrosis of unclear etiology and chronicity. History of COPD, diabetes and chronic respiratory failure. EXAM: CT ABDOMEN AND PELVIS WITHOUT CONTRAST TECHNIQUE: Multidetector CT imaging of the abdomen and pelvis was performed following the standard protocol without IV contrast. COMPARISON:  Renal ultrasound 01/10/2017.  Chest CT 10/24/2015. FINDINGS: Lower chest: Small right pleural effusion. There are right infrahilar airspace opacities within the right middle and lower lobes with associated volume loss and central airway narrowing. There is lesser patchy airspace disease in the left lower lobe. The lung bases are incompletely visualized. Coronary artery calcifications are noted. Hepatobiliary: The liver appears unremarkable as imaged in the noncontrast state. No evidence of gallstones, gallbladder wall thickening  or biliary dilatation. Pancreas: Unremarkable. No pancreatic ductal dilatation or surrounding inflammatory changes. Spleen: Normal in size without focal abnormality. Adrenals/Urinary Tract: Both adrenal glands appear normal. There is no evidence of urinary tract calculus. The right kidney appears unremarkable. The left kidney demonstrates marked cortical thinning and moderate hydronephrosis and hydroureter. The left ureter is dilated into the pelvis where there is a 7.1 x 4.9 cm mass (image 55), likely obstructing the ureter. The urinary bladder is distended. Contiguous with the left pelvic mass, there is dependent nodularity in the left bladder base near the trigone, measuring up to 20 mm on image 65. This could reflect tumor or blood clot. Stomach/Bowel: No evidence of bowel wall thickening, distention or surrounding inflammatory change. There are mild distal colonic diverticular changes. Vascular/Lymphatic: There are no discretely enlarged abdominal or pelvic lymph nodes. The left pelvic mass abuts the left femoral vasculature. Aortic and branch vessel atherosclerosis noted. Reproductive: Hysterectomy. Probable residual ovarian tissue on the right. The left ovary is not seen separate from the left adnexal mass described above. Other: No ascites or peritoneal nodularity. Musculoskeletal: No acute or significant osseous findings. Stable lower thoracic compression deformities. Previous proximal right femoral ORIF. IMPRESSION: 1. 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 The degree of hydronephrosis and cortical thinning imply chronicity. 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 Lymphoma consider less likely. 2. Nodularity within the left bladder lumen could reflect distal extension of tumor or  blood clot related to the patient's hematuria. Cystoscopy recommended. 3. No evidence of distant  metastases or urinary tract calculus. 4. Bibasilar airspace opacities suspicious for chronic aspiration. Small right pleural effusion. Chest radiographic follow up recommended. 5.  Aortic Atherosclerosis (ICD10-I70.0). Electronically Signed   By: Richardean Sale M.D.   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  After review of her pulmonary situation at baseline (3 L home oxygen) and review of pulmonary note today, patient is clearly at high risk for any general anesthetic.  I had a detailed discussion with her and her son today and they agreed that any major surgical intervention would not be in her best interest.  Therefore, any aggressive and curative therapy is not likely possible.  We did discuss the option of a percutaneous biopsy of her mass if interventional radiology felt it would be accessible.  This could be performed under local anesthesia with sedation but should not require general anesthesia.  This approach would allow for a diagnosis to allow patient to explore all options and direct care per oncology if a malignancy is confirmed.  It may be best to allow her to continue her recovery from her recent acute pulmonary event over the next few days and to reconsider for percutaneous biopsy on Monday if improving.  I will discuss with her primary team.  2) Left hydronephrosis: Her left kidney appears to be very poorly functioning based on minimal residual parenchyma.  Would not intervene with ureteral stenting or percutaneous nephrostomy unless it is for palliative reasons such as left flank pain that is not controllable with medication.      LOS: 9 days   Davina Howlett,LES 01/12/2017, 5:41 PM

## 2017-01-12 NOTE — Progress Notes (Signed)
Nutrition Follow-up  DOCUMENTATION CODES:   Not applicable  INTERVENTION:  - Continue to provide CLD when pt is safe for PO consumption. - Diet advancement as medically feasible. - Will order Boost Breeze TID, each supplement provides 250 kcal and 9 grams of protein; to be used to give meds when/if able.  - Will order daily multivitamin with minerals.  - RD will continue to monitor for additional needs.  NUTRITION DIAGNOSIS:   Inadequate protein intake related to  (current diet order) as evidenced by  (CLD does not meet estimated protein need). -updated, ongoing  GOAL:   Patient will meet greater than or equal to 90% of their needs -unmet with current diet order  MONITOR:   PO intake, Supplement acceptance, Diet advancement, Weight trends, Labs  ASSESSMENT:   66 year old woman PMH COPD, chronic respiratory failure on home oxygen, diabetes, dementia presented to the emergency department with shortness of breath, hypoxia. Placed on BiPAP, treated with antibiotics and referred for admission for respiratory acidosis, pneumonia, COPD exacerbation, acute respiratory failure.  7/26 Pt self-extubated on 7/23 and diet was advanced from NPO to CLD later in the day that day; diet remains the same at this time with no documented intakes since advancement. Diet order states "only if bedside RN feels is safe via bedside RN evaluation." Will order Boost Breeze to help with protein intake. Weight was trending up and is now trending down with current weight -1.1 kg compared to admission weight.  Per Nephrology MD note yesterday evening: Gross hematuria and left pelvic mass/left ureteral obstruction:  I would recommend proceeding with a contrasted CT of abdomen and pelvis with delayed images to better define the patient's pelvic mass which could be originating from the bladder, distal left ureter, or ovary potentially.    Medications reviewed; 20 mg oral Lasix/day, sliding scale Novolog, 40 mg  Solu-medrol/day, 20 mg oral Protonix/day, 1 packet Miralax BID, 1 tablet Senokot BID. Labs reviewed; CBG: 162 mg/dL this AM, Cl: 92 mmol/L yesterday.     7/23 - Pt self-extubated at ~7:40 AM today; OGT has been removed.  - Pt was previously receiving Vital AF 1.2 @ 50 mL/hr which provided 1440 kcal, 90 grams of protein, and 973 mL free water.  - Estimated nutrition needs updated d/t extubation and based on admission weight with current weight +4.8 kg since that time.  - Pt is A/O to self only this AM.  - No BM since 01/04/17 despite bowel regimen.    7/20 - OGT in place. - Pt is currently receiving Vital AF 1.2 @ 45 mL/hr which is providing 1296 kcal, 81 grams of protein, and 876 mL free water.  - Weight +1.2 kg from yesterday so yesterday's weight (65.6 kg) used in re-estimating needs.  - Will adjust TF as outlined above based on increase in kcal need.   Patient is currently intubated on ventilator support MV: 6.8L/min Temp (24hrs), Avg:100.2 F (37.9 C), Min:99.4 F (37.4 C), Max:101.1 F (38.4 C) BP: 114/58 and MAP: 77 Drip: Fentanyl @ 125 mcg/hr.     Diet Order:  Diet clear liquid Room service appropriate? Yes; Fluid consistency: Thin  Skin:  Reviewed, no issues  Last BM:  7/25  Height:   Ht Readings from Last 1 Encounters:  01/03/17 4\' 11"  (1.499 m)    Weight:   Wt Readings from Last 1 Encounters:  01/12/17 136 lb 7.4 oz (61.9 kg)    Ideal Body Weight:  42.91 kg  BMI:  Body mass index  is 27.56 kg/m.  Estimated Nutritional Needs:   Kcal:  1575-1765 (25-28 kcal/kg)  Protein:  63-76 grams (1-1.2 grams/kg)  Fluid:  1.5 L/day  EDUCATION NEEDS:   No education needs identified at this time    Jarome Matin, MS, RD, LDN, CNSC Inpatient Clinical Dietitian Pager # [phone] After hours/weekend pager # [phone]

## 2017-01-12 NOTE — Progress Notes (Signed)
Pharmacy Antibiotic Note  Traci Thomas is a 66 y.o. female admitted on 01/03/2017 with pneumonia.  Pt has known hx of COPD, came from facility with fever and respiratory distress.  Pharmacy has been consulted for [facility] and Vancomycin dosing.  Today, 01/12/2017: - Tm Afebrile,  WBC trending back down - SCr improving- CrCl 55-101ml/min. - Vanc trough 73mcg/ml  -  Stenotrophomonas maltophilia growing in trach aspirate. Resist to Bactrim and LVQ. Additional sensitivies require a send out & were never requested per micro lab.  Called to add 7/25- lab is checking to see if [facility] can check sensitivities to TCN, Unasyn, Fortaz.  Plan:  Continue Vancomycin 1g IV q24h.  Follow up renal fxn, culture results, and clinical course.  Continue Ceftazidime 2g IV q12h.    F/U LOT- anticipate 14 days  Height: 4\' 11"  (149.9 cm) Weight: 136 lb 7.4 oz (61.9 kg) IBW/kg (Calculated) : 43.2  Temp (24hrs), Avg:98.9 F (37.2 C), Min:98.5 F (36.9 C), Max:99.1 F (37.3 C)   Recent Labs Lab 01/07/17 0514 01/08/17 0442 01/08/17 0932 01/09/17 0506 01/10/17 0350 01/11/17 0833 01/12/17 0430 01/12/17 0906  WBC 8.6 8.4  --   --  13.3* 15.5* 12.3*  --   CREATININE 1.09* 1.08*  --  0.86 0.77 0.87 0.80  --   VANCOTROUGH  --   --  17  --   --   --   --  14*    Estimated Creatinine Clearance: 56.9 mL/min (by C-G formula based on SCr of 0.8 mg/dL).    Allergies  Allergen Reactions  . Adhesive [Tape] Other (See Comments)    Skin peels off   . Ciprofloxacin     Unknown reaction   . Latex     Unknown reaction   . Prednisone Nausea And Vomiting    Antimicrobials this admission: 7/17 Cefepime >> 7/21 7/17 Vancomycin >>  7/22 Ceftaz >>  Dose adjustments this admission: 7/19 0730 VT = 16 on vanc 500 q12 (SCr increasing, prior to 4th dose) 7/22 VT = 17 on 1g q24h, cont same  Microbiology results:  7/17 BCx: ngtd 7/17 MRSA PCR: (-) 7/17 Sputum (expectorated): rare MRSA (sens: vanc, doxy,  bactrim) 7/18 Trach aspirate: rare MRSA (sens: vanc, doxy, bactrim) and rare Stenotroph.maltophilia (res: LVQ, Bactrim)  Thank you for allowing pharmacy to be a part of this patient's care.  Netta Cedars, PharmD, BCPS Pager: [phone] . 01/12/2017,10:07 AM.

## 2017-01-12 NOTE — Progress Notes (Signed)
Pulmonary & Critical Care Note  ADMISSION DATE:01/03/2017  CONSULTATION DATE:01/04/2017  REFERRING MD:Iskra Myers / [facility]  Presenting HPI:  66 y.o. female admitted 7/17 per [facility] w/ acute on chronic hypoxic and hypercarbic respiratory failure in setting of HCAP +/- acute decompensated diastolic HF. PCCM asked to see on 7/18 for on-going hypoxia. Intubated. Self-extubated 7/23 am.   Subjective:  Pt reports she is hurting in her left lower abdomen.  RN reports hematuria improved. O2 remains at 9L     Temp:  [98.5 F (36.9 C)-99.1 F (37.3 C)] 99.1 F (37.3 C) (07/26 0455) Pulse Rate:  [79-85] 82 (07/26 0455) Resp:  [19-20] 19 (07/26 0455) BP: (128-169)/(77-91) 145/77 (07/26 0455) SpO2:  [90 %-94 %] 92 % (07/26 0813) Weight:  [136 lb 7.4 oz (61.9 kg)] 136 lb 7.4 oz (61.9 kg) (07/26 0519)  General:  Adult female in NAD HEENT: MM pink/moist, no jvd PSY: calm / confused Neuro: Awake, alert, oriented to self.  Not oriented to place / events  CV: s1s2 rrr, no m/r/g PULM: even/non-labored, lungs bilaterally clear, no wheeze / improved  GI:soft, non-tender, bsx4 active  Extremities: warm/dry, no edema  Skin: no rashes or lesions   LINES/TUBES: OETT 7/18 - 7/23 (self-extubated) OGT 7/18 - 7/23 RUE PICC >>> Foley 7/18 >>> out PIV  CBC Latest Ref Rng & Units 01/12/2017 01/11/2017 01/10/2017  WBC 4.0 - 10.5 K/uL 12.3(H) 15.5(H) 13.3(H)  Hemoglobin 12.0 - 15.0 g/dL 9.6(L) 10.9(L) 9.5(L)  Hematocrit 36.0 - 46.0 % 31.4(L) 36.6 32.0(L)  Platelets 150 - 400 K/uL 229 234 196   BMP Latest Ref Rng & Units 01/12/2017 01/11/2017 01/10/2017  Glucose 65 - 99 mg/dL - 152(H) 141(H)  BUN 6 - 20 mg/dL - 20 29(H)  Creatinine 0.44 - 1.00 mg/dL 0.80 0.87 0.77  Sodium 135 - 145 mmol/L - 145 144  Potassium 3.5 - 5.1 mmol/L - 3.5 4.5  Chloride 101 - 111 mmol/L - 92(L) 96(L)  CO2 22 - 32 mmol/L - 43(H) 40(H)  Calcium 8.9 - 10.3 mg/dL - 9.5 8.8(L)    Hepatic Function Latest Ref Rng & Units  01/11/2017 01/03/2017 09/28/2016  Total Protein 6.5 - 8.1 g/dL - 6.9 8.0  Albumin 3.5 - 5.0 g/dL 3.7 3.2(L) 4.1  AST 15 - 41 U/L - 10(L) 16  ALT 14 - 54 U/L - 9(L) 9(L)  Alk Phosphatase 38 - 126 U/L - 77 131(H)  Total Bilirubin 0.3 - 1.2 mg/dL - <0.1(L) 0.5    IMAGING/STUDIES: TTE 10/13/16:  LV with mild concentric hypertrophy & EF 50-55%. Unable to assess diastolic function. No wall motion abnormalities. LA & RA normal in size. RV normal in size and function. No aortic stenosis or regurgitation. Aortic root normal in size. No mitral stenosis or regurgitation. No pulmonic stenosis or regurgitation. No tricuspid regurgitation. No pericardial effusion. EEG 7/20:  No seizures.  CT HEAD W/O 7/20:  No acute abnormality. Chronic microvascular ischemic change and advanced for age cortical atrophy.  Atherosclerosis. Chronic bilateral mastoid effusions. PORT CXR 7/22: Persistent blunting of bilateral costophrenic angles with hazy opacification. No pleural effusion appreciated. Right upper extremity PICC line in place. Endotracheal tube and enteric feeding tube in place. PORT CXR 7/24:  Right upper extremity PICC line in place. Persistent bilateral basilar opacities right greater than left. No new focal opacity appreciated. RENAL US 7/24: marked left sided hydronephrosis, etiology not demonstrated on the present exam, renal parenchymal thinning, evaluation of the left renal parenchyma is limited by habitus and   bowel gas, only right ureteral jet was visualized CT RENAL STUDY 7/25: large left pelvis mass suspicious for malignancy obstructing the distal left ureter, nodularity within the left bladder lumen could reflect distal extension of tumor or blood clot, no evidence of distant metastases, bibasilar opacities suspicious for chronic aspiration CT ABD/PELVIS 7/25: large enhancing pelvic mass lesion on the left, likely ovarian or transitional cell origin, occlusion of the left ureter, lesion envelops portions of  the iliac vasculature on the left, compression on the iliac venous structures noted although no DVT noted  MICROBIOLOGY: MRSA PCR 7/17:  Negative  Blood Cultures x2 7/17:  Negative  Tracheal Aspirate Culture 7/18:  MRSA & Stenotrophomonas maltophilia Urine Streptococcal Antigen 7/17:  Negative   ANTIBIOTICS: Cefepime 7/16 - 7/21 Vancomycin 7/16 >>> Fortaz 7/22 >>>  SIGNIFICANT EVENTS: 07/17 - Admit 07/18 - Intubated for worsening respiratory status 07/23 - Self extubated & code status clarified w/ son & other family as DNR/DNI 07/24 - Tx to floor, [facility] service 07/25 - CT renal stone assessed with gross hematuria > left pelvic mass, Urology consulted   ASSESSMENT/PLAN:  66 y.o. Female with acute on chronic hypoxic respiratory failure and pneumonia due to MRSA and stenotrophomonas. Delirium is slowly improving. Patient's respiratory status overall seems to be stable.  Acute on chronic hypoxic respiratory failure - in setting of MRSA/stenotrophomonas PNA P:  Wean O2 to baseline 3L  Lasix 20 mg IV QD  Pulmonary hygiene - IS, mobilize    Sepsis: secondary to PNA P: Resolving, monitor WBC      MRSA & stenotrophomonas pneumonia - ID signed off P: Lab contacted for sensitivities  Continue abx as above  Consider 14 days total abx  Will need follow up CXR in 4-6 weeks    COPD with exacerbation - resolving  P: Continue solumedrol 20 mg IV QD for 2 more days then d/c  Continue pulmicort BID  Duoneb Q6   Acute encephalopathy - initially felt related to acute critical illness / toxic metabolic encephalopathy, steroids.  In light of her pelvic mass, ? If metastatic disease P: Wean steroids to off  Promote sleep wake cycle  Limit sedating medications  Consider head imaging    Acute on chronic diastolic congestive heart failure P: Lasix as above  Continue lopressor 12.5 mg BID    Hematuria - left pelvic mass identified on CT, urology following  P: Defer work up to  primary / Urology    Prophylaxis:  SCDs, Lovenox [place] daily, & Protonix PO daily. Diet:  Diet as tolerated Code Status:  DNR / DNI Disposition: Medical floor, [facility] service. Family Update: Son updated at bedside 7/26 Global: Given her overall state of health, prior wishes for DNR and recent acute illness I question her ability to tolerate a surgical procedure / biopsy.  Concerns reviewed with son 7/26, he indicates understanding.  Will need further discussion with son regarding plan of care / timing of biopsy with Urology input.    Brandi Ollis, NP-C  Pulmonary & Critical Care Pgr: [phone] or if no answer [phone] 01/12/2017, 10:13 AM           

## 2017-01-13 DIAGNOSIS — G92 Toxic encephalopathy: Secondary | ICD-10-CM

## 2017-01-13 DIAGNOSIS — Z0189 Encounter for other specified special examinations: Secondary | ICD-10-CM

## 2017-01-13 DIAGNOSIS — A419 Sepsis, unspecified organism: Principal | ICD-10-CM

## 2017-01-13 DIAGNOSIS — Z7189 Other specified counseling: Secondary | ICD-10-CM

## 2017-01-13 DIAGNOSIS — R1900 Intra-abdominal and pelvic swelling, mass and lump, unspecified site: Secondary | ICD-10-CM

## 2017-01-13 DIAGNOSIS — Z515 Encounter for palliative care: Secondary | ICD-10-CM

## 2017-01-13 LAB — CBC
HCT: 34 % — ABNORMAL LOW (ref 36.0–46.0)
Hemoglobin: 10.5 g/dL — ABNORMAL LOW (ref 12.0–15.0)
MCH: 27.8 pg (ref 26.0–34.0)
MCHC: 30.9 g/dL (ref 30.0–36.0)
MCV: 89.9 fL (ref 78.0–100.0)
PLATELETS: 243 10*3/uL (ref 150–400)
RBC: 3.78 MIL/uL — ABNORMAL LOW (ref 3.87–5.11)
RDW: 16.7 % — AB (ref 11.5–15.5)
WBC: 13 10*3/uL — ABNORMAL HIGH (ref 4.0–10.5)

## 2017-01-13 LAB — BASIC METABOLIC PANEL
Anion gap: 11 (ref 5–15)
BUN: 12 mg/dL (ref 6–20)
CALCIUM: 9.1 mg/dL (ref 8.9–10.3)
CO2: 36 mmol/L — AB (ref 22–32)
CREATININE: 0.72 mg/dL (ref 0.44–1.00)
Chloride: 97 mmol/L — ABNORMAL LOW (ref 101–111)
GFR calc Af Amer: >60 mL/min (ref >60)
GLUCOSE: 157 mg/dL — AB (ref 65–99)
Potassium: 3 mmol/L — ABNORMAL LOW (ref 3.5–5.1)
Sodium: 144 mmol/L (ref 135–145)

## 2017-01-13 LAB — GLUCOSE, CAPILLARY
GLUCOSE-CAPILLARY: 160 mg/dL — AB (ref 65–99)
Glucose-Capillary: 217 mg/dL — ABNORMAL HIGH (ref 65–99)
Glucose-Capillary: 95 mg/dL (ref 65–99)
Glucose-Capillary: 123 mg/dL — ABNORMAL HIGH (ref 65–99)

## 2017-01-13 MED ORDER — KETOROLAC TROMETHAMINE 15 MG/ML IJ SOLN
15.0000 mg | Freq: Four times a day (QID) | INTRAMUSCULAR | Status: AC | PRN
  Administered 2017-01-13 – 2017-01-14 (×2): 15 mg via INTRAVENOUS
  Filled 2017-01-13 (×2): qty 1

## 2017-01-13 MED ORDER — DEXTROSE 5 % IV SOLN
2.0000 g | Freq: Two times a day (BID) | INTRAVENOUS | Status: AC
  Administered 2017-01-13 – 2017-01-17 (×8): 2 g via INTRAVENOUS
  Filled 2017-01-13 (×8): qty 2

## 2017-01-13 MED ORDER — VANCOMYCIN HCL IN DEXTROSE 1-5 GM/200ML-% IV SOLN
1000.0000 mg | INTRAVENOUS | Status: AC
  Administered 2017-01-14 – 2017-01-16 (×3): 1000 mg via INTRAVENOUS
  Filled 2017-01-13 (×3): qty 200

## 2017-01-13 MED ORDER — POTASSIUM CHLORIDE CRYS ER 20 MEQ PO TBCR
40.0000 meq | EXTENDED_RELEASE_TABLET | Freq: Two times a day (BID) | ORAL | Status: AC
  Administered 2017-01-13 (×2): 40 meq via ORAL
  Filled 2017-01-13 (×2): qty 2

## 2017-01-13 NOTE — Progress Notes (Signed)
[facility] PROGRESS NOTE    Progress Note  Traci Thomas  QAS:[id number] DOB: 1951-03-29 DOA: 01/03/2017 PCP: Garwin Brothers, MD     Brief Narrative:   Traci Thomas is an 66 y.o. female past medical history of COPD, chronic respiratory failure on oxygen, dementia presented to the emergency room with shortness of breath due to MRSA pneumonia, BiPAP and antibiotics from admission. She decompensated and was transferred to critical care, subsequently she has been extubated  Assessment/Plan:   Acute respiratory failure with hypoxia and hypercapnia ([place]) Secondary to lobar pneumonia and stenotrophomonas pneumonia  in the setting of COPD exacerbation: [name] is now on 3 L of oxygen appreciate pulmonary's assistance.  Titrating IV steroids down. Afebrile leukocytosis cont to improve. She is currently on IV vancomycin and ceftazidime , will d/c ceftazidime and cont IV vanc for MRSA for a total of 14 days, pt has a PICC line Continue bronchodilators. Physical therapy consult  Sepsis secondary to lobar pneumonia: Appears to be resolved.  Acute diastolic heart failure: Next line she appears to be euvolemic: Her weight is down from 139 and admission to 136. Continue oral Lasix at home dose. Daily weight, strict I and O's  Acute metabolic encephalopathy superimposed on dementia with associated myoclonic jerks: CT head showed no acute disease, EEG suggest metabolic encephalopathy. No seizure activity. She is now back to baseline  Large enhancing pelvic mass lesion on the left: It was discussed with some, at this point from a pulmonary perspective she is high risk for any surgical procedure. Suspect arising from the left ovary.  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  If she cont to improve urology percutaneous biopsy of her mass if interventional radiology felt it  would be accessible. Neurology also recommended a cystoscopy with left retrograde pyelogram with biopsy, however due to her tenuous status she does not appear to be a candidate from a pulmonary standpoint at this point for general anesthesia.  Hemoglobin stable. Urology would not intervene with ureteral stenting or percutaneous nephrostomy unless it is for palliative reasons  Consult IR for Possible Biopsy on Monday, consult palliative care due to her poor prognosis.  Diabetes mellitus with peripheral neuropathy: The setting of steroids continue sliding scale.  Normocytic anemia Stable.  Hypokalemia: Replete potassium orally.   DVT prophylaxis: Scd Family Communication:none Disposition Plan/Barrier to D/C: Hopefully Monday ot tuesday Code Status:     Code Status Orders        Start     Ordered   01/09/17 1108  Do not attempt resuscitation (DNR)  Continuous    Question Answer Comment  In the event of cardiac or respiratory ARREST Do not call a "code blue"   In the event of cardiac or respiratory ARREST Do not perform Intubation, CPR, defibrillation or ACLS   In the event of cardiac or respiratory ARREST Use medication by any route, position, wound care, and other measures to relive pain and suffering. May use oxygen, suction and manual treatment of airway obstruction as needed for comfort.      01/09/17 1107    Code Status History    Date Active Date Inactive Code Status Order ID Comments User Context   01/03/2017  9:03 AM 01/09/2017 11:07 AM Full Code [id number]  Samuella Cota, MD Inpatient   09/28/2016  3:38 PM 10/17/2016  5:26 PM Partial Code [id number]  Erick Colace,  NP ED   09/28/2016  3:33 PM 09/28/2016  3:38 PM Full Code [id number]  Erick Colace, NP ED   10/21/2015  4:41 AM 10/24/2015  6:45 PM DNR [id number]  Rise Patience, MD Inpatient   10/21/2015  4:40 AM 10/21/2015  4:41 AM DNR [id number]  Rise Patience, MD Inpatient   03/29/2015  9:02 AM 04/01/2015  1:59 PM  DNR [id number]  Janece Canterbury, MD Inpatient   03/26/2015  7:15 AM 03/29/2015  9:02 AM Full Code [id number]  Janece Canterbury, MD Inpatient        IV Access:    Peripheral IV   Procedures and diagnostic studies:   Ct Abdomen Pelvis W Contrast  Result Date: 01/11/2017 CLINICAL DATA:  Known left hydronephrosis and pelvic mass EXAM: CT ABDOMEN AND PELVIS WITH CONTRAST TECHNIQUE: Multidetector CT imaging of the abdomen and pelvis was performed using the standard protocol following bolus administration of intravenous contrast. CONTRAST:  100 mL Isovue-300 COMPARISON:  CT from earlier in the same day. FINDINGS: Lower chest: Small right pleural effusion and right lower lobe consolidation are again seen. Some right middle lobe consolidation is noted as well. Patchy infiltrate in the left lower lobe is also noted. Hepatobiliary: No focal liver abnormality is seen. No gallstones, gallbladder wall thickening, or biliary dilatation. Pancreas: Unremarkable. No pancreatic ductal dilatation or surrounding inflammatory changes. Spleen: Normal in size without focal abnormality. Adrenals/Urinary Tract: The adrenal glands are within normal limits. The right kidney demonstrates no renal calculi or obstructive changes. The left kidney again demonstrates significant hydronephrosis likely of the long-standing nature given the diffuse degree of cortical thinning. The hydronephrosis extends inferiorly and there is in growth into the mid to distal left ureter causing the obstructive change. This seen growth arises from the known pelvic mass lesion. Bladder is partially distended. An enhancing lesion is noted along the posterior aspect of the bladder near the trigone on the left. This is similar to that seen on the prior exam. Stomach/Bowel: No obstructive or inflammatory changes are noted. Scattered diverticular change is seen. The appendix is not visualize consistent with a prior surgical history. Vascular/Lymphatic: The  aorta demonstrates atherosclerotic changes. Reproductive: Uterus is not well visualized and has likely been surgically removed. Other: Enhancing pelvic mass lesion is again identified which measures at least 6.3 by 4.8 cm. It extends for approximately 9.5 cm in craniocaudad projection. It causes occlusion of the mid to distal left ureter and show some extension along the common iliac artery on the left. There is also soft tissue surrounding the common iliac artery on the left related to the mass lesion. It also extends inferiorly towards the posterior wall of the bladder similar to that seen on the prior exam. Musculoskeletal: Postsurgical changes in the proximal right femur are noted. No definitive osseous metastatic lesions are seen. Degenerative change of the lumbar spine is noted. IMPRESSION: Large enhancing pelvic mass lesion on the left. Again this is likely of left ovarian or transitional cell origin. Occlusion of the left ureter is noted. There is also extension towards the posterior aspect of the bladder wall near the trigone and UVJ. The lesion in develops portions of the iliac vasculature on the left. Compression upon the left iliac venous structures is noted although known deep venous thrombosis is seen. Stable bibasilar changes with associated right-sided effusion. Aortic Atherosclerosis (ICD10-170.0) Electronically Signed   By: Inez Catalina M.D.   On: 01/11/2017 20:51   Dg Chest Port 1 View  Result Date: 01/12/2017  CLINICAL DATA:  Respiratory failure. EXAM: PORTABLE CHEST 1 VIEW COMPARISON:  01/10/2017 . FINDINGS: Right PICC line noted with tip projected over the superior vena cava. Heart size normal. Bibasilar atelectasis/ infiltrates unchanged. Small right pleural effusion. No pneumothorax. IMPRESSION: 1. Right PICC line stable position with tip projected superior vena cava. 2. Bibasilar atelectasis/ infiltrates unchanged. Small right pleural effusion . Electronically Signed   By: Marcello Moores   Register   On: 01/12/2017 07:03   Ct Renal Stone Study  Result Date: 01/11/2017 CLINICAL DATA:  Gross hematuria. Left-sided hydronephrosis of unclear etiology and chronicity. History of COPD, diabetes and chronic respiratory failure. EXAM: CT ABDOMEN AND PELVIS WITHOUT CONTRAST TECHNIQUE: Multidetector CT imaging of the abdomen and pelvis was performed following the standard protocol without IV contrast. COMPARISON:  Renal ultrasound 01/10/2017.  Chest CT 10/24/2015. FINDINGS: Lower chest: Small right pleural effusion. There are right infrahilar airspace opacities within the right middle and lower lobes with associated volume loss and central airway narrowing. There is lesser patchy airspace disease in the left lower lobe. The lung bases are incompletely visualized. Coronary artery calcifications are noted. Hepatobiliary: The liver appears unremarkable as imaged in the noncontrast state. No evidence of gallstones, gallbladder wall thickening or biliary dilatation. Pancreas: Unremarkable. No pancreatic ductal dilatation or surrounding inflammatory changes. Spleen: Normal in size without focal abnormality. Adrenals/Urinary Tract: Both adrenal glands appear normal. There is no evidence of urinary tract calculus. The right kidney appears unremarkable. The left kidney demonstrates marked cortical thinning and moderate hydronephrosis and hydroureter. The left ureter is dilated into the pelvis where there is a 7.1 x 4.9 cm mass (image 55), likely obstructing the ureter. The urinary bladder is distended. Contiguous with the left pelvic mass, there is dependent nodularity in the left bladder base near the trigone, measuring up to 20 mm on image 65. This could reflect tumor or blood clot. Stomach/Bowel: No evidence of bowel wall thickening, distention or surrounding inflammatory change. There are mild distal colonic diverticular changes. Vascular/Lymphatic: There are no discretely enlarged abdominal or pelvic lymph nodes.  The left pelvic mass abuts the left femoral vasculature. Aortic and branch vessel atherosclerosis noted. Reproductive: Hysterectomy. Probable residual ovarian tissue on the right. The left ovary is not seen separate from the left adnexal mass described above. Other: No ascites or peritoneal nodularity. Musculoskeletal: No acute or significant osseous findings. Stable lower thoracic compression deformities. Previous proximal right femoral ORIF. IMPRESSION: 1. 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 The degree of hydronephrosis and cortical thinning imply chronicity. 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 Lymphoma consider less likely. 2. Nodularity within the left bladder lumen could reflect distal extension of tumor or blood clot related to the patient's hematuria. Cystoscopy recommended. 3. No evidence of distant metastases or urinary tract calculus. 4. Bibasilar airspace opacities suspicious for chronic aspiration. Small right pleural effusion. Chest radiographic follow up recommended. 5.  Aortic Atherosclerosis (ICD10-I70.0). Electronically Signed   By: Richardean Sale M.D.   On: 01/11/2017 11:58     Medical Consultants:    None.  Anti-Infectives:   IV vanc  Subjective:    [name] she relates no complains.  Objective:    Vitals:   01/12/17 2115 01/13/17 0239 01/13/17 0519 01/13/17 0532  BP:   (!) 156/83   Pulse:   88   Resp:   18   Temp:   98.3 F (36.8 C)   TempSrc:  Oral   SpO2: 94% 96% 92%   Weight:    60.1 kg (132 lb 7.9 oz)  Height:        Intake/Output Summary (Last 24 hours) at 01/13/17 0655 Last data filed at 01/13/17 0602  Gross per 24 hour  Intake             2530 ml  Output              700 ml  Net             1830 ml   Filed Weights   01/11/17 0500 01/12/17 0519 01/13/17 0532  Weight: 64.4 kg (141 lb 15.6 oz) 61.9 kg (136 lb 7.4  oz) 60.1 kg (132 lb 7.9 oz)    Exam: General exam: In no acute distress Respiratory system: Good air movement CTA B/L Cardiovascular system: RRR, + S1 and S2, no JVD Gastrointestinal system: Abdomen is nondistended, soft and nontender.  Central nervous system: Alert and awake, non focal. Extremities: No pedal edema. Skin: No rashes, lesions or ulcers Psychiatry: Judgement and insight appear normal.   Data Reviewed:    Labs: Basic Metabolic Panel:  Recent Labs Lab 01/07/17 0514 01/08/17 0442 01/09/17 0506 01/10/17 0350 01/11/17 0833 01/12/17 0430 01/13/17 0600  NA 146* 147*  --  144 145  --  144  K 4.5 4.4  --  4.5 3.5  --  3.0*  CL 103 102  --  96* 92*  --  97*  CO2 35* 37*  --  40* 43*  --  36*  GLUCOSE 223* 252*  --  141* 152*  --  157*  BUN 44* 39*  --  29* 20  --  12  CREATININE 1.09* 1.08* 0.86 0.77 0.87 0.80 0.72  CALCIUM 8.5* 8.6*  --  8.8* 9.5  --  9.1  MG 2.1  --   --  2.1 2.1  --   --   PHOS 3.6  --   --   --  3.2  --   --    GFR Estimated Creatinine Clearance: 56.1 mL/min (by C-G formula based on SCr of 0.72 mg/dL). Liver Function Tests:  Recent Labs Lab 01/11/17 0833  ALBUMIN 3.7   No results for input(s): LIPASE, AMYLASE in the last 168 hours. No results for input(s): AMMONIA in the last 168 hours. Coagulation profile  Recent Labs Lab 01/10/17 1447  INR 1.12    CBC:  Recent Labs Lab 01/08/17 0442 01/10/17 0350 01/11/17 0833 01/12/17 0430 01/13/17 0600  WBC 8.4 13.3* 15.5* 12.3* 13.0*  NEUTROABS 7.0  --  12.3*  --   --   HGB 8.8* 9.5* 10.9* 9.6* 10.5*  HCT 29.1* 32.0* 36.6 31.4* 34.0*  MCV 93.3 93.6 93.4 89.5 89.9  PLT 206 196 234 229 243   Cardiac Enzymes: No results for input(s): CKTOTAL, CKMB, CKMBINDEX, TROPONINI in the last 168 hours. BNP (last 3 results) No results for input(s): PROBNP in the last 8760 hours. CBG:  Recent Labs Lab 01/11/17 2107 01/12/17 0729 01/12/17 1146 01/12/17 1701 01/12/17 2106  GLUCAP 136*  162* 286* 184* 80   D-Dimer: No results for input(s): DDIMER in the last 72 hours. Hgb A1c: No results for input(s): HGBA1C in the last 72 hours. Lipid Profile: No results for input(s): CHOL, HDL, LDLCALC, TRIG, CHOLHDL, LDLDIRECT in the last 72 hours. Thyroid function studies: No results for input(s): TSH, T4TOTAL, T3FREE, THYROIDAB in the last 72 hours.  Invalid input(s): FREET3 Anemia work  up: No results for input(s): VITAMINB12, FOLATE, FERRITIN, TIBC, IRON, RETICCTPCT in the last 72 hours. Sepsis Labs:  Recent Labs Lab 01/10/17 0350 01/11/17 0833 01/12/17 0430 01/13/17 0600  WBC 13.3* 15.5* 12.3* 13.0*   Microbiology Recent Results (from the past 240 hour(s))  MRSA PCR Screening     Status: None   Collection Time: 01/03/17  8:08 AM  Result Value Ref Range Status   MRSA by PCR NEGATIVE NEGATIVE Final    Comment:        The GeneXpert MRSA Assay (FDA approved for NASAL specimens only), is one component of a comprehensive MRSA colonization surveillance program. It is not intended to diagnose MRSA infection nor to guide or monitor treatment for MRSA infections.   Culture, sputum-assessment     Status: None   Collection Time: 01/03/17  9:45 AM  Result Value Ref Range Status   Specimen Description EXPECTORATED SPUTUM  Final   Special Requests NONE  Final   Sputum evaluation THIS SPECIMEN IS ACCEPTABLE FOR SPUTUM CULTURE  Final   Report Status 01/03/2017 FINAL  Final  Culture, respiratory (NON-Expectorated)     Status: None   Collection Time: 01/03/17  9:45 AM  Result Value Ref Range Status   Specimen Description EXPECTORATED SPUTUM  Final   Special Requests NONE Reflexed from [id number]  Final   Gram Stain   Final    ABUNDANT WBC PRESENT, PREDOMINANTLY PMN RARE GRAM POSITIVE COCCI RARE GRAM POSITIVE RODS RARE YEAST Performed at [facility], [address], [place], [place] [address]    Culture RARE METHICILLIN RESISTANT STAPHYLOCOCCUS AUREUS  Final    Report Status 01/05/2017 FINAL  Final   Organism ID, Bacteria METHICILLIN RESISTANT STAPHYLOCOCCUS AUREUS  Final      Susceptibility   Methicillin resistant staphylococcus aureus - MIC*    CIPROFLOXACIN >=8 RESISTANT Resistant     ERYTHROMYCIN >=8 RESISTANT Resistant     GENTAMICIN <=0.5 SENSITIVE Sensitive     OXACILLIN >=4 RESISTANT Resistant     TETRACYCLINE <=1 SENSITIVE Sensitive     VANCOMYCIN <=0.5 SENSITIVE Sensitive     TRIMETH/SULFA <=10 SENSITIVE Sensitive     CLINDAMYCIN <=0.25 SENSITIVE Sensitive     RIFAMPIN <=0.5 SENSITIVE Sensitive     Inducible Clindamycin NEGATIVE Sensitive     * RARE METHICILLIN RESISTANT STAPHYLOCOCCUS AUREUS  Culture, respiratory (NON-Expectorated)     Status: None   Collection Time: 01/04/17  5:33 PM  Result Value Ref Range Status   Specimen Description TRACHEAL ASPIRATE  Final   Special Requests NONE  Final   Gram Stain   Final    ABUNDANT WBC PRESENT,BOTH PMN AND MONONUCLEAR NO ORGANISMS SEEN Performed at [facility], [address], [place], [place] [address]    Culture   Final    RARE METHICILLIN RESISTANT STAPHYLOCOCCUS AUREUS RARE STENOTROPHOMONAS MALTOPHILIA    Report Status 01/08/2017 FINAL  Final   Organism ID, Bacteria METHICILLIN RESISTANT STAPHYLOCOCCUS AUREUS  Final   Organism ID, Bacteria STENOTROPHOMONAS MALTOPHILIA  Final      Susceptibility   Methicillin resistant staphylococcus aureus - MIC*    CIPROFLOXACIN >=8 RESISTANT Resistant     ERYTHROMYCIN >=8 RESISTANT Resistant     GENTAMICIN <=0.5 SENSITIVE Sensitive     OXACILLIN >=4 RESISTANT Resistant     TETRACYCLINE <=1 SENSITIVE Sensitive     VANCOMYCIN <=0.5 SENSITIVE Sensitive     TRIMETH/SULFA <=10 SENSITIVE Sensitive     CLINDAMYCIN <=0.25 SENSITIVE Sensitive     RIFAMPIN <=  0.5 SENSITIVE Sensitive     Inducible Clindamycin NEGATIVE Sensitive     * RARE METHICILLIN RESISTANT STAPHYLOCOCCUS AUREUS   Stenotrophomonas maltophilia - MIC*    LEVOFLOXACIN >=8  RESISTANT Resistant     TRIMETH/SULFA >=320 RESISTANT Resistant     * RARE STENOTROPHOMONAS MALTOPHILIA     Medications:   . budesonide (PULMICORT) nebulizer solution  0.5 mg Nebulization BID  . chlorhexidine gluconate (MEDLINE KIT)  15 mL Mouth Rinse BID  . Chlorhexidine Gluconate Cloth  6 each Topical Daily  . feeding supplement  1 Container Oral TID BM  . furosemide  20 mg Oral Daily  . gabapentin  200 mg Oral Q6H  . insulin aspart  0-15 Units Subcutaneous TID WC  . insulin aspart  0-5 Units Subcutaneous QHS  . ipratropium-albuterol  3 mL Nebulization Q6H  . mouth rinse  15 mL Mouth Rinse QID  . methylPREDNISolone (SOLU-MEDROL) injection  20 mg Intravenous Daily  . metoprolol tartrate  12.5 mg Oral BID  . multivitamin with minerals  1 tablet Oral Daily  . oxybutynin  5 mg Oral Q breakfast  . pantoprazole  20 mg Oral Daily  . polyethylene glycol  17 g Oral BID  . QUEtiapine  300 mg Oral QHS  . senna  1 tablet Oral BID  . sodium chloride flush  10-40 mL Intracatheter Q12H  . sodium chloride flush  3 mL Intravenous Q12H  . venlafaxine  50 mg Oral BID WC   Continuous Infusions: . cefTAZidime (FORTAZ)  IV Stopped (01/12/17 2150)  . vancomycin Stopped (01/12/17 1132)     LOS: 10 days   Charlynne Cousins  [facility] Pager [phone]  *Please refer to [email or web address], password [email or web address] to get updated schedule on who will round on this patient, as hospitalists switch teams weekly. If 7PM-7AM, please contact night-coverage at [email or web address], password [email or web address] for any overnight needs.  01/13/2017, 6:55 AM

## 2017-01-13 NOTE — Care Management Note (Signed)
Case Management Note  Patient Details  Name: Traci Thomas MRN: [id number] Date of Birth: May 25, 1951  Subjective/Objective:  66 y/o f admitted w/Acute resp failure. From home. Pulmonary s/o. Urology following. PT recc SNF. CSW already following.                  Action/Plan:d/c SNF.   Expected Discharge Date:                  Expected Discharge Plan:  Skilled Nursing Facility  In-House Referral:  Clinical Social Work  Discharge planning Services  CM Consult  Post Acute Care Choice:    Choice offered to:     DME Arranged:    DME Agency:     HH Arranged:    [place] Agency:     Status of Service:  In process, will continue to follow  If discussed at Long Length of Stay Meetings, dates discussed:    Additional Comments:  Dessa Phi, RN 01/13/2017, 11:04 AM

## 2017-01-13 NOTE — Progress Notes (Signed)
Patient's O2 weaned to 4L.  No acute events overnight.  Urology notes reviewed.  PCCM will be available PRN.  Please call if new needs arise.   Noe Gens, NP-C [facility] Pgr: [phone] or if no answer [phone] 01/13/2017, 10:22 AM

## 2017-01-13 NOTE — Progress Notes (Signed)
[facility] PCCM  PCCM will sign off  Roselie Awkward, MD [facility] Pager: [phone] Cell: [phone] After 3pm or if no response, call [phone]

## 2017-01-13 NOTE — Progress Notes (Signed)
Patient c/o diarrhea, requesting medication to stop it. Several stools documented today but characteristics of stool not documented. No diarrhea seen this shift. On-call MD made aware and stool softeners/ laxatives D/Cd. Did not order C-Diff sample at this time per protocol- stool softeners given this AM. Will continue to monitor and pass on to next RN.

## 2017-01-13 NOTE — Progress Notes (Addendum)
Physical Therapy Treatment Patient Details Name: Traci Thomas MRN: [id number] DOB: [birth year] Today's Date: 01/13/2017    History of Present Illness Pt is a 66 y.o. with PMHx of COPD, chronic respiratory failure on home oxygen, diabetes, and dementia. Pt presented to the emergency department with shortness of breath and hypoxia. Pt currently admitted for acute respiratory failure with hypoxia, sepsis, COPD exacerbation, and acute diastolic congestive heart failure.  ETT 7/18 >> 7/23    PT Comments    Pt familiar to me from prior admit.  Assisted [facility]OB to [facility] then amb in hallway.  Tolerated session well.   Follow Up Recommendations  SNF     Equipment Recommendations       Recommendations for Other Services       Precautions / Restrictions Precautions Precautions: Fall Precaution Comments: Monitor Sats, Pt currently under high flow nasal cannula with baseline of 3 L/min at SNF Restrictions Weight Bearing Restrictions: No    Mobility  Bed Mobility Overal bed mobility: Needs Assistance Bed Mobility: Supine to Sit     Supine to sit: Min guard;Min assist     General bed mobility comments: use of rail and increased time  Transfers Overall transfer level: Needs assistance Equipment used: None Transfers: Sit to/from [facility] Sit to Stand: Min guard;Min assist Stand pivot transfers: Min guard;Min assist       General transfer comment: assisted from elevated bed to [facility] then from [facility] to EOB with 25% VC's on proper hand placement and turn completion  Ambulation/Gait Ambulation/Gait assistance: +2 safety/equipment;Min assist Ambulation Distance (Feet): 35 Feet Assistive device: Rolling walker (2 wheeled) Gait Pattern/deviations: Step-through pattern Gait velocity: decreased   General Gait Details: remained on 3 lts nasal sats decreased from 92% to 87%.  Limited distance due to dyspnea/fatigue.     Stairs            Wheelchair Mobility     Modified Rankin (Stroke Patients Only)       Balance                                            Cognition Arousal/Alertness: Awake/alert Behavior During Therapy: WFL for tasks assessed/performed Overall Cognitive Status: Within Functional Limits for tasks assessed                                        Exercises      General Comments        Pertinent Vitals/Pain Pain Assessment: No/denies pain    Home Living                      Prior Function            PT Goals (current goals can now be found in the care plan section) Progress towards PT goals: Progressing toward goals    Frequency    Min 3X/week      PT Plan Current plan remains appropriate    Co-evaluation              AM-PAC PT "6 Clicks" Daily Activity  Outcome Measure  Difficulty turning over in bed (including adjusting bedclothes, sheets and blankets)?: Total Difficulty moving from lying on back to sitting on the side of the bed? : Total Difficulty sitting  down on and standing up from a chair with arms (e.g., wheelchair, bedside commode, etc,.)?: Total Help needed moving to and from a bed to chair (including a wheelchair)?: A Lot Help needed walking in hospital room?: A Lot Help needed climbing 3-5 steps with a railing? : A Lot 6 Click Score: 9    End of Session Equipment Utilized During Treatment: Oxygen Activity Tolerance: Patient tolerated treatment well Patient left: in chair;with chair alarm set;with call bell/phone within reach   PT Visit Diagnosis: Unsteadiness on feet (R26.81)     Time: 8676-1950 PT Time Calculation (min) (ACUTE ONLY): 32 min  Charges:  $Gait Training: 8-22 mins $Therapeutic Activity: 8-22 mins                    G Codes:       {Amberli Ruegg  PTA [facility]  Acute  Rehab Pager      [phone]

## 2017-01-13 NOTE — Consult Note (Signed)
Consultation Note Date: 01/13/2017   Patient Name: Traci Thomas  DOB: Aug 09, 1950  MRN: [id number]  Age / Sex: 66 y.o., female  PCP: Garwin Brothers, MD Referring Physician: Charlynne Cousins, MD  Reason for Consultation: Establishing goals of care and Psychosocial/spiritual support  HPI/Patient Profile: 66 y.o. female  with past medical history of COPD, chronic respiratory failure on oxygen, and dementia. She has been residing at a SNF for the past year and a half. She presented with progressive shortness of breath and hypoxia and was admitted on 01/03/2017. She is being treated for acute on chronic hypoxic and hypercarbic respiratory failure from HCAP (MRSA and stenotrophomonas) and COPD exacerbation, sepsis from the PNA, and acute decompensated dCHF. She was briefly intubated, then extubated with resumption of full DNR status. Her respiratory and volume status have improved. Unfortunately, she was also found to have a left sided pelvic mass with extension into the bladder and left ureterovesical junction causing left ureteral obstruction with severe hydronephrosis. This was found after work-up done for left flank pain and gross hematuria. 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 Palliative consulted to assist in clarifying goals of care.   Clinical Assessment and Goals of Care: I met with [name] and her son Traci Thomas at the bedside. While Traci Thomas is very socially interactive and communicative, she is clearly very confused. She was not able to tell me about her health issues, and when reminded she had no insight into the seriousness of her health issues--both acute and chronic. Traci Thomas, however, is very knowledgeable about her heath history and had a good grasp on the current issues. He was able to tell me about the pelvic mass, and walked me through the options for management, which included doing a percutaneous biopsy in a few  days when she is more stable, versus not doing a biopsy and focusing on comfort and symptom management.   As we talked through these two management options I emphasized the benefits and burdens of each option, as well as how they may fit into different trajectories of care. Traci Thomas was very reflective on his mother's health issues, and is most concerned with ensuring comfort for as long as possible. That said, he is struggling with wanting to know what the mass is, in case it is something that can be treated without significant side effects. At this point, he asked for a little more time to consider the options, which I think is entirely reasonable.   Primary Decision Maker NEXT OF KIN. Pt is very confused, but does defer to her son Traci Thomas for decision making. Traci Thomas is next of kin.    SUMMARY OF RECOMMENDATIONS    DNR, continue supportive care  Traci Thomas is considering his options surrounding work-up of the pelvic mass and needs some time to process the information and make a decision. He is also hoping his mother will participate in the decision, despite her being too confused at present   She will return to a SNF on discharge, however she may qualify for Hospice services based on the decision surrounding the mass work-up. I did not yet bring up Hospice during this initial meeting, but it will need to be addressed prior to discharge given her expected progressive symptom burden related to this mass  Palliative will follow-up with pt/son this weekend, likely Sunday. Please call with any questions or concerns if she needs to be seen sooner. Team phone: [phone].  Code Status/Advance Care Planning:  DNR  Symptom  Management:   Nhu denies symptoms at present. She appears quite comfortable in bed.   Psycho-social/Spiritual:   Desire for further Chaplaincy support:no  Additional Recommendations: TBD  Prognosis:   Unable to determine  Discharge Planning: To Be Determined      Primary  Diagnoses: Present on Admission: . Acute respiratory failure with hypoxia and hypercapnia (HCC) . Chronic respiratory failure with hypoxia ([place]) . COPD exacerbation ([place]) . Lobar pneumonia ([place]) . Stage 4 very severe COPD by GOLD classification ([place])   I have reviewed the medical record, interviewed the patient and family, and examined the patient. The following aspects are pertinent.  Past Medical History:  Diagnosis Date  . Asthma   . Back pain   . COPD (chronic obstructive pulmonary disease) (HCC)    Home O2 3lpm, theophylline  . Diabetes mellitus without complication [facility])    Social History   Social History  . Marital status: Single    Spouse name: N/A  . Number of children: N/A  . Years of education: N/A   Social History Main Topics  . Smoking status: Former Smoker    Packs/day: 1.00    Types: Cigarettes    Quit date: 2016  . Smokeless tobacco: Never Used     Comment: started smoking 2017 to current sometimes 1 daily or once a month  . Alcohol use 0.0 oz/week     Comment: occasional beer  . Drug use: No  . Sexual activity: No   Other Topics Concern  . None   Social History Narrative  . None   Family History  Problem Relation Age of Onset  . COPD Mother   . Diabetes Unknown    Scheduled Meds: . budesonide (PULMICORT) nebulizer solution  0.5 mg Nebulization BID  . chlorhexidine gluconate (MEDLINE KIT)  15 mL Mouth Rinse BID  . Chlorhexidine Gluconate Cloth  6 each Topical Daily  . feeding supplement  1 Container Oral TID BM  . furosemide  20 mg Oral Daily  . gabapentin  200 mg Oral Q6H  . insulin aspart  0-15 Units Subcutaneous TID WC  . insulin aspart  0-5 Units Subcutaneous QHS  . ipratropium-albuterol  3 mL Nebulization Q6H  . mouth rinse  15 mL Mouth Rinse QID  . methylPREDNISolone (SOLU-MEDROL) injection  20 mg Intravenous Daily  . metoprolol tartrate  12.5 mg Oral BID  . multivitamin with minerals  1 tablet Oral Daily  . oxybutynin  5 mg Oral Q  breakfast  . pantoprazole  20 mg Oral Daily  . polyethylene glycol  17 g Oral BID  . potassium chloride  40 mEq Oral BID  . QUEtiapine  300 mg Oral QHS  . senna  1 tablet Oral BID  . sodium chloride flush  10-40 mL Intracatheter Q12H  . sodium chloride flush  3 mL Intravenous Q12H  . venlafaxine  50 mg Oral BID WC   Continuous Infusions: . cefTAZidime (FORTAZ)  IV    . [START ON 01/14/2017] vancomycin     PRN Meds:.acetaminophen **OR** acetaminophen, ALPRAZolam, bisacodyl, hydrALAZINE, iopamidol, ipratropium-albuterol, sodium chloride flush Allergies  Allergen Reactions  . Adhesive [Tape] Other (See Comments)    Skin peels off   . Ciprofloxacin     Unknown reaction   . Latex     Unknown reaction   . Prednisone Nausea And Vomiting   Review of Systems  Unable to perform ROS -Pt is very conversational but very confused. She is not consistent in her responses to questions.  Physical Exam  Constitutional: She appears well-developed and well-nourished. No distress.  HENT:  Head: Normocephalic and atraumatic.  Mouth/Throat: Oropharynx is clear and moist and mucous membranes are normal. Abnormal dentition.  Eyes: EOM are normal.  Neck: Normal range of motion. Neck supple.  Pulmonary/Chest: Effort normal.  Mild SOB at rest, but very conversational  Abdominal: Soft. There is generalized tenderness.  Musculoskeletal: Normal range of motion.  Neurological: She is alert.  Oriented to person and son, but otherwise not consistent in responses to questions.  Skin: Skin is warm and dry. Bruising noted.  Psychiatric: She has a normal mood and affect. Her speech is rapid and/or pressured (Rapid speech but not upset or agitated). She is hyperactive. Cognition and memory are impaired. She expresses impulsivity.  Flight of ideas.     Vital Signs: BP (!) 162/93 (BP Location: Left Arm)   Pulse 79   Temp 98.5 F (36.9 C) (Oral)   Resp 19   Ht '4\' 11"'$  (1.499 m)   Wt 60.1 kg (132 lb 7.9 oz)    SpO2 93%   BMI 26.76 kg/m  Pain Assessment: 0-10   Pain Score: Asleep   SpO2: SpO2: 93 % O2 Device:SpO2: 93 % O2 Flow Rate: .O2 Flow Rate (L/min): 4 L/min  IO: Intake/output summary:  Intake/Output Summary (Last 24 hours) at 01/13/17 1526 Last data filed at 01/13/17 1411  Gross per 24 hour  Intake              960 ml  Output                0 ml  Net              960 ml    LBM: Last BM Date: 01/13/17 Baseline Weight: Weight: 63 kg (139 lb) Most recent weight: Weight: 60.1 kg (132 lb 7.9 oz)     Palliative Assessment/Data: PPS 50%    Time Total: 50 minutes Greater than 50%  of this time was spent counseling and coordinating care related to the above assessment and plan.  Signed by: Charlynn Court, NP Palliative Medicine Team Pager # [phone] (M-F 7a-5p) Team Phone # [phone] (Nights/Weekends)

## 2017-01-14 LAB — BASIC METABOLIC PANEL
ANION GAP: 9 (ref 5–15)
BUN: 16 mg/dL (ref 6–20)
CO2: 34 mmol/L — AB (ref 22–32)
Calcium: 8.9 mg/dL (ref 8.9–10.3)
Chloride: 99 mmol/L — ABNORMAL LOW (ref 101–111)
Creatinine, Ser: 0.9 mg/dL (ref 0.44–1.00)
GFR calc Af Amer: >60 mL/min (ref >60)
GLUCOSE: 123 mg/dL — AB (ref 65–99)
POTASSIUM: 4.2 mmol/L (ref 3.5–5.1)
Sodium: 142 mmol/L (ref 135–145)

## 2017-01-14 LAB — GLUCOSE, CAPILLARY
GLUCOSE-CAPILLARY: 167 mg/dL — AB (ref 65–99)
Glucose-Capillary: 143 mg/dL — ABNORMAL HIGH (ref 65–99)
Glucose-Capillary: 267 mg/dL — ABNORMAL HIGH (ref 65–99)
Glucose-Capillary: 105 mg/dL — ABNORMAL HIGH (ref 65–99)

## 2017-01-14 MED ORDER — IPRATROPIUM-ALBUTEROL 0.5-2.5 (3) MG/3ML IN SOLN
3.0000 mL | Freq: Three times a day (TID) | RESPIRATORY_TRACT | Status: DC
  Administered 2017-01-14 – 2017-01-17 (×9): 3 mL via RESPIRATORY_TRACT
  Filled 2017-01-14 (×9): qty 3

## 2017-01-14 NOTE — Progress Notes (Addendum)
PROGRESS NOTE  Traci Thomas  JHE:[id number] DOB: 1950/09/04 DOA: 01/03/2017 PCP: Garwin Brothers, MD   Brief Narrative: 66 year old woman PMH COPD, chronic respiratory failure on home oxygen, diabetes, dementia presented to the emergency department with shortness of breath, hypoxia. Placed on BiPAP, treated with antibiotics and referred for admission for respiratory acidosis, pneumonia, COPD exacerbation, acute respiratory failure. She decompensated, was transferred to critical care and intubated. Treated for MRSA and stenotrophomonas pneumonia. Subsequently extubated. Ongoing care for pneumonia, acute heart failure, sepsis, COPD exacerbation, acute encephalopathy and newly diagnosed pelvic mass.  Assessment & Plan: Principal Problem:   Acute respiratory failure with hypoxia and hypercapnia (HCC) Active Problems:   COPD exacerbation (HCC)   Diabetes mellitus type 2 in nonobese (HCC)   Stage 4 very severe COPD by GOLD classification ([place])   Lobar pneumonia (HCC)   Chronic respiratory failure with hypoxia (HCC)   Acute respiratory acidosis   Normocytic anemia   Gross hematuria   Hydronephrosis of left kidney   HCAP (healthcare-associated pneumonia)   Pelvic mass   Palliative care by specialist  Acute on chronic hypoxic, hypercapnic respiratory failure: secondary to lobar MRSA and stenotrophomonas pneumonia and COPD exacerbation and chronic GOLD IV COPD.  - Weaned IV steroids, no wheezing. Pulm signed off 7/27. - Continue bronchodilators. - Wean oxygen to baseline 3LPM.  - Continue antibiotics as per ID: vancomycin and ceftazidime. Plan to conclude 7/31  - Monitor sensitivities testing for stenotrophomonas maltophila per ID recommendations. 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  - Discussed at length  with son. Will have IR evaluate for biopsy for Dx to proceed with options for palliative therapies.  - Appreciate palliative care assistance for goals of care discussions.   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  - Urology consulted, no intervention planned due to pulmonary risk. If required for palliative reasons, could consider nephrostomy tube.  - Continue monitoring creatinine, with chronic nature, doubt left kidney is contributing to GFR and doubt that relieving obstruction would improve GFR, which remains within normal limits.   Sepsis secondary to lobar pneumonia, MRSA and stenotrophomonas - Sepsis appears resolved. Continue antibiotics as above.  Acute on chronic diastolic CHF: Resolved acute component. - Appears euvolemic, wt down from 139lbs at admission.  - Continue po lasix at home dose, monitor Cr.  Acute metabolic encephalopathy superimposed on dementia with associated myoclonic jerks. CT head no acute disease. EEG suggested metabolic encephalopathy. No seizure activity. - Appears to be improving spontaneously, per son this is a recurrent problem.  Gross hematuria, acute.  - Lovenox discontinued. - Hemoglobin stable. Renal ultrasound noted, no stone on CT  Diabetes mellitus with peripheral neuropathy - Continue SSI. Anticipate improvement with tapering off steroids.  Normocytic anemia, baseline - Stable  DVT prophylaxis: SCDs Code Status: DNR/DNI Family Communication: None at bedside Disposition Plan: Eventual DC back to SNF pending clinical course. PT has evaluated pt.  Consultants:  Pulmonary critical care medicine   Urology, Dr. Matilde Sprang  IR  Procedures:  ETT 7/18 >> self-extubated 7/23  EEG Clinical Interpretation: This EEG is consistent with a moderate to severe generalized non-specific cerebral dysfunction(encephalopathy). Though non-specific, triphasic waves can suggest metabolic cause of  encephalopathy.There was no seizure or seizure predisposition recorded on this study.   Antimicrobials:  Cefepime 7/17 >> 7/21   Ceftazidime 7/22 >>  7/31  Vancomycin 7/17 >> 7/31  Subjective: Pt altered, limiting history. Has no complaints currently. No overnight events.   Objective: Vitals:   01/14/17 0754 01/14/17 0818 01/14/17 1342 01/14/17 1408  BP:  121/73 134/63   Pulse:  88 91 85  Resp:  _0 Temp:   98.4 F (36.9 C)   TempSrc:   Oral   SpO2: 95% 92% 95% 96%  Weight:      Height:        Intake/Output Summary (Last 24 hours) at 01/14/17 1451 Last data filed at 01/14/17 1300  Gross per 24 hour  Intake             1150 ml  Output             1250 ml  Net             -100 ml   Filed Weights   01/12/17 0519 01/13/17 0532 01/14/17 0446  Weight: 61.9 kg (136 lb 7.4 oz) 60.1 kg (132 lb 7.9 oz) 58.9 kg (129 lb 13.6 oz)    Examination: General exam: Chronically ill-appearing 66 y.o. female in no distress Respiratory system: Tachypneic on 3L by [place]. No wheezing or crackles. Cardiovascular system: Regular rate and rhythm. No murmur, rub, or gallop. No JVD, and no pedal edema. Gastrointestinal system: Abdomen soft, non-tender, non-distended, with normoactive bowel sounds. No organomegaly or masses felt. Central nervous system: Alert and disoriented, no agitation, not able to follow conversation. No focal neurological deficits. Extremities: Warm, no deformities Skin: No rashes, lesions no ulcers Psychiatry: Judgement and insight appear impaired. Mood & affect appropriate.   CBC:  Recent Labs Lab 01/08/17 0442 01/10/17 0350 01/11/17 0833 01/12/17 0430 01/13/17 0600  WBC 8.4 13.3* 15.5* 12.3* 13.0*  NEUTROABS 7.0  --  12.3*  --   --   HGB 8.8* 9.5* 10.9* 9.6* 10.5*  HCT 29.1* 32.0* 36.6 31.4* 34.0*  MCV 93.3 93.6 93.4 89.5 89.9  PLT 206 196 234 229 967   Basic Metabolic Panel:  Recent Labs Lab 01/08/17 0442  01/10/17 0350 01/11/17 0833  01/12/17 0430 01/13/17 0600 01/14/17 0406  NA 147*  --  144 145  --  144 142  K 4.4  --  4.5 3.5  --  3.0* 4.2  CL 102  --  96* 92*  --  97* 99*  CO2 37*  --  40* 43*  --  36* 34*  GLUCOSE 252*  --  141* 152*  --  157* 123*  BUN 39*  --  29* 20  --  12 16  CREATININE 1.08*  < > 0.77 0.87 0.80 0.72 0.90  CALCIUM 8.6*  --  8.8* 9.5  --  9.1 8.9  MG  --   --  2.1 2.1  --   --   --   PHOS  --   --   --  3.2  --   --   --   < > = values in this interval not displayed. GFR: Estimated Creatinine Clearance: 49.3 mL/min (by C-G formula based on SCr of 0.9 mg/dL).   Recent Results (from the past 240 hour(s))  Culture, respiratory (NON-Expectorated)     Status: None   Collection Time: 01/04/17  5:33 PM  Result Value Ref Range Status   Specimen Description TRACHEAL ASPIRATE  Final   Special Requests NONE  Final   Gram Stain   Final    ABUNDANT WBC PRESENT,BOTH PMN AND MONONUCLEAR NO  ORGANISMS SEEN Performed at [facility], [facility] [address], [place], [place] [address]    Culture   Final    RARE METHICILLIN RESISTANT STAPHYLOCOCCUS AUREUS RARE STENOTROPHOMONAS MALTOPHILIA    Report Status 01/08/2017 FINAL  Final   Organism ID, Bacteria METHICILLIN RESISTANT STAPHYLOCOCCUS AUREUS  Final   Organism ID, Bacteria STENOTROPHOMONAS MALTOPHILIA  Final      Susceptibility   Methicillin resistant staphylococcus aureus - MIC*    CIPROFLOXACIN >=8 RESISTANT Resistant     ERYTHROMYCIN >=8 RESISTANT Resistant     GENTAMICIN <=0.5 SENSITIVE Sensitive     OXACILLIN >=4 RESISTANT Resistant     TETRACYCLINE <=1 SENSITIVE Sensitive     VANCOMYCIN <=0.5 SENSITIVE Sensitive     TRIMETH/SULFA <=10 SENSITIVE Sensitive     CLINDAMYCIN <=0.25 SENSITIVE Sensitive     RIFAMPIN <=0.5 SENSITIVE Sensitive     Inducible Clindamycin NEGATIVE Sensitive     * RARE METHICILLIN RESISTANT STAPHYLOCOCCUS AUREUS   Stenotrophomonas maltophilia - MIC*    LEVOFLOXACIN >=8 RESISTANT Resistant     TRIMETH/SULFA  >=320 RESISTANT Resistant     * RARE STENOTROPHOMONAS MALTOPHILIA      Radiology Studies: No results found.  Scheduled Meds: . budesonide (PULMICORT) nebulizer solution  0.5 mg Nebulization BID  . chlorhexidine gluconate (MEDLINE KIT)  15 mL Mouth Rinse BID  . Chlorhexidine Gluconate Cloth  6 each Topical Daily  . feeding supplement  1 Container Oral TID BM  . furosemide  20 mg Oral Daily  . gabapentin  200 mg Oral Q6H  . insulin aspart  0-15 Units Subcutaneous TID WC  . insulin aspart  0-5 Units Subcutaneous QHS  . ipratropium-albuterol  3 mL Nebulization TID  . mouth rinse  15 mL Mouth Rinse QID  . metoprolol tartrate  12.5 mg Oral BID  . multivitamin with minerals  1 tablet Oral Daily  . oxybutynin  5 mg Oral Q breakfast  . pantoprazole  20 mg Oral Daily  . QUEtiapine  300 mg Oral QHS  . sodium chloride flush  10-40 mL Intracatheter Q12H  . sodium chloride flush  3 mL Intravenous Q12H  . venlafaxine  50 mg Oral BID WC   Continuous Infusions: . cefTAZidime (FORTAZ)  IV Stopped (01/14/17 0931)  . vancomycin Stopped (01/14/17 1033)     LOS: 11 days   Time spent: 25 minutes.  Vance Gather, MD [facility] Pager [phone]  If 7PM-7AM, please contact night-coverage [email or web address] Password [email or web address] 01/14/2017, 2:51 PM

## 2017-01-14 NOTE — Progress Notes (Signed)
Daily Progress Note   Patient Name: Traci Thomas       Date: 01/14/2017 DOB: [birth year]  Age: 66 y.o. MRN#: [id number] Attending Physician: Patrecia Pour, MD Primary Care Physician: Garwin Brothers, MD Admit Date: 01/03/2017  Reason for Consultation/Follow-up: Establishing goals of care  Subjective:  awake alert Still confused at times, thinks she is in the hospital for a mammogram Son not at bedside, patient thinks he worked all night, wants [place] to call him in afternoon or tomorrow States her breathing is fine Had some stomach upset last night, complains of some diarrhea Tolerating her diet reasonably well this morning  See below:  Length of Stay: 11  Current Medications: Scheduled Meds:  . budesonide (PULMICORT) nebulizer solution  0.5 mg Nebulization BID  . chlorhexidine gluconate (MEDLINE KIT)  15 mL Mouth Rinse BID  . Chlorhexidine Gluconate Cloth  6 each Topical Daily  . feeding supplement  1 Container Oral TID BM  . furosemide  20 mg Oral Daily  . gabapentin  200 mg Oral Q6H  . insulin aspart  0-15 Units Subcutaneous TID WC  . insulin aspart  0-5 Units Subcutaneous QHS  . ipratropium-albuterol  3 mL Nebulization TID  . mouth rinse  15 mL Mouth Rinse QID  . metoprolol tartrate  12.5 mg Oral BID  . multivitamin with minerals  1 tablet Oral Daily  . oxybutynin  5 mg Oral Q breakfast  . pantoprazole  20 mg Oral Daily  . QUEtiapine  300 mg Oral QHS  . sodium chloride flush  10-40 mL Intracatheter Q12H  . sodium chloride flush  3 mL Intravenous Q12H  . venlafaxine  50 mg Oral BID WC    Continuous Infusions: . cefTAZidime (FORTAZ)  IV Stopped (01/14/17 0931)  . vancomycin 1,000 mg (01/14/17 0932)    PRN Meds: acetaminophen **OR** acetaminophen, ALPRAZolam, bisacodyl,  hydrALAZINE, iopamidol, ipratropium-albuterol, ketorolac, sodium chloride flush  Physical Exam         NAD Shallow clear breath sounds anteriorly S1 S2 Abdomen soft No edema Confused  Vital Signs: BP 121/73 (BP Location: Left Arm)   Pulse 88   Temp 98.7 F (37.1 C) (Oral)   Resp 18   Ht '4\' 11"'$  (1.499 m)   Wt 58.9 kg (129 lb 13.6 oz)   SpO2 92%  BMI 26.23 kg/m  SpO2: SpO2: 92 % O2 Device: O2 Device: Nasal Cannula O2 Flow Rate: O2 Flow Rate (L/min): 3 L/min  Intake/output summary:  Intake/Output Summary (Last 24 hours) at 01/14/17 1013 Last data filed at 01/14/17 0919  Gross per 24 hour  Intake             1030 ml  Output              700 ml  Net              330 ml   LBM: Last BM Date: 01/13/17 Baseline Weight: Weight: 63 kg (139 lb) Most recent weight: Weight: 58.9 kg (129 lb 13.6 oz)       Palliative Assessment/Data:      Patient Active Problem List   Diagnosis Date Noted  . Pelvic mass   . Palliative care by specialist   . HCAP (healthcare-associated pneumonia)   . Gross hematuria 01/11/2017  . Hydronephrosis of left kidney 01/11/2017  . Acute respiratory failure with hypoxia and hypercapnia ([place]) 01/03/2017  . Acute respiratory acidosis 01/03/2017  . Normocytic anemia 01/03/2017  . Chronic respiratory failure with hypoxia ([place]) 11/30/2016  . Constipation 11/02/2016  . Lobar pneumonia ([place]) 10/04/2016  . Acute metabolic encephalopathy 16/03/9603  . Abdominal pain   . Sepsis ([place])   . Hip fracture ([place]) 10/21/2015  . Stage 4 very severe COPD by GOLD classification ([place]) 10/21/2015  . Anemia 10/21/2015  . Dementia 10/21/2015  . Goals of care, counseling/discussion 10/21/2015    Class: Acute  . Closed right hip fracture ([place])   . Encounter for palliative care   . COPD exacerbation ([place]) 03/25/2015  . 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 Depression 03/25/2015    Palliative Care Assessment & Plan   Patient Profile:     Assessment: L pelvic mass, concern for malignancy tenuous resp status: lobar PNA, recent resp failure Ongoing acute metabolic encephalopathy  Recommendations/Plan:   will need SNF with palliative on d/c  continue current scope of care Will try to touch base with son in am: extent of workup to be decided by son soon.  Follow disease trajectory Agree with DNR  Code Status:    Code Status Orders        Start     Ordered   01/09/17 1108  Do not attempt resuscitation (DNR)  Continuous    Question Answer Comment  In the event of cardiac or respiratory ARREST Do not call a "code blue"   In the event of cardiac or respiratory ARREST Do not perform Intubation, CPR, defibrillation or ACLS   In the event of cardiac or respiratory ARREST Use medication by any route, position, wound care, and other measures to relive pain and suffering. May use oxygen, suction and manual treatment of airway obstruction as needed for comfort.      01/09/17 1107    Code Status History    Date Active Date Inactive Code Status Order ID Comments User Context   01/03/2017  9:03 AM 01/09/2017 11:07 AM Full Code [id number]  Samuella Cota, MD Inpatient   09/28/2016  3:38 PM 10/17/2016  5:26 PM Partial Code [id number]  Erick Colace, NP ED   09/28/2016  3:33 PM 09/28/2016  3:38 PM Full Code [id number]  Erick Colace, NP ED   10/21/2015  4:41 AM 10/24/2015  6:45 PM DNR [id number]  Rise Patience, MD Inpatient   10/21/2015  4:40 AM 10/21/2015  4:41 AM DNR [id number]  Rise Patience, MD Inpatient   03/29/2015  9:02 AM 04/01/2015  1:59 PM DNR [id number]  Janece Canterbury, MD Inpatient   03/26/2015  7:15 AM 03/29/2015  9:02 AM Full Code [id number]  Janece Canterbury, MD Inpatient       Prognosis:   guarded given multiple co morbidities.   Discharge Planning:  [facility] for rehab with Palliative care service follow-up  Care plan was discussed with  patient  Thank you for allowing the  Palliative Medicine Team to assist in the care of this patient.   Time In: 9.45 Time Out: 10.10 Total Time 25 Prolonged Time Billed No       Greater than 50%  of this time was spent counseling and coordinating care related to the above assessment and plan.  Loistine Chance, MD [phone]  Please contact Palliative Medicine Team phone at [phone] for questions and concerns.

## 2017-01-15 LAB — BASIC METABOLIC PANEL
ANION GAP: 9 (ref 5–15)
BUN: 15 mg/dL (ref 6–20)
CO2: 34 mmol/L — AB (ref 22–32)
Calcium: 9.3 mg/dL (ref 8.9–10.3)
Chloride: 101 mmol/L (ref 101–111)
Creatinine, Ser: 0.82 mg/dL (ref 0.44–1.00)
GFR calc Af Amer: >60 mL/min (ref >60)
GLUCOSE: 127 mg/dL — AB (ref 65–99)
POTASSIUM: 4 mmol/L (ref 3.5–5.1)
Sodium: 144 mmol/L (ref 135–145)

## 2017-01-15 LAB — GLUCOSE, CAPILLARY
GLUCOSE-CAPILLARY: 150 mg/dL — AB (ref 65–99)
Glucose-Capillary: 157 mg/dL — ABNORMAL HIGH (ref 65–99)
Glucose-Capillary: 260 mg/dL — ABNORMAL HIGH (ref 65–99)

## 2017-01-15 NOTE — Progress Notes (Signed)
PROGRESS NOTE  Traci Thomas  GUY:[id number] DOB: Apr 02, 1951 DOA: 01/03/2017 PCP: Garwin Brothers, MD   Brief Narrative: 66 year old woman PMH COPD, chronic respiratory failure on home oxygen, diabetes, dementia presented to the emergency department with shortness of breath, hypoxia. Placed on BiPAP, treated with antibiotics and referred for admission for respiratory acidosis, pneumonia, COPD exacerbation, acute respiratory failure. She decompensated, was transferred to critical care and intubated. Treated for MRSA and stenotrophomonas pneumonia. Subsequently extubated. She has improved with ongoing care for pneumonia, acute heart failure. Sepsis, COPD exacerbation, and acute encephalopathy have resolved. Work up for left flank pain demonstrated new left pelvic mass causing chronic hydronephrosis. No intervention is required per urology. IR has been consulted to consider biopsy.  Assessment & Plan: Principal Problem:   Acute respiratory failure with hypoxia and hypercapnia (HCC) Active Problems:   COPD exacerbation (HCC)   Diabetes mellitus type 2 in nonobese (HCC)   Stage 4 very severe COPD by GOLD classification ([place])   Lobar pneumonia (HCC)   Chronic respiratory failure with hypoxia (HCC)   Acute respiratory acidosis   Normocytic anemia   Gross hematuria   Hydronephrosis of left kidney   HCAP (healthcare-associated pneumonia)   Pelvic mass   Palliative care by specialist  Acute on chronic hypoxic, hypercapnic respiratory failure: secondary to lobar MRSA and stenotrophomonas pneumonia and COPD exacerbation and chronic GOLD IV COPD.  - Weaned IV steroids, no wheezing. Pulm signed off 7/27. - Continue bronchodilators. - Supplemental oxygen requirement has returned to baseline 3LPM.  - Continue antibiotics per ID thru PICC: vancomycin and ceftazidime. Plan to conclude 7/31  - Monitor sensitivities testing for stenotrophomonas maltophila per ID recommendations. 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  - Discussed at length with son. Will have IR evaluate for biopsy for Dx to proceed with options for palliative therapies.  - Appreciate palliative care assistance for goals of care discussions.   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  - Urology consulted, no intervention planned due to pulmonary risk. If required for palliative reasons, could consider nephrostomy tube.  - Continue monitoring creatinine, with chronic nature, doubt left kidney is contributing to GFR and doubt that relieving obstruction would improve GFR, which remains within normal limits.   Sepsis secondary to lobar pneumonia, MRSA and stenotrophomonas - Sepsis appears resolved. Continue antibiotics as above.  Acute on chronic diastolic CHF: Resolved acute component. - Appears euvolemic, wt down from 139lbs at admission (had subsequently risen to 149lbs 7/23).  - Continue po lasix at home dose, monitor Cr.  Acute metabolic encephalopathy superimposed on dementia with associated myoclonic jerks. CT head no acute disease. EEG suggested metabolic encephalopathy. No seizure activity. - Improving spontaneously, per son this is a recurrent problem.  Gross hematuria, acute.  - Lovenox discontinued. - Hemoglobin stable. Renal ultrasound noted, no stone on CT  Diabetes mellitus with peripheral neuropathy - Continue SSI. Anticipate improvement with tapering off steroids.  Normocytic anemia, baseline - Stable  DVT prophylaxis: SCDs Code Status: DNR/DNI Family Communication: Son at bedside Disposition Plan: Will DC to SNF in next 24 - 48 hours if remains improved, depending on timing of and candidacy for biopsy per IR.  Consultants:  Pulmonary critical care medicine   Urology,  Dr. Matilde Sprang  IR  Procedures:  ETT 7/18 >> self-extubated 7/23  EEG  Clinical Interpretation: This EEG is consistent with a moderate to severe generalized non-specific cerebral dysfunction(encephalopathy). Though non-specific, triphasic waves can suggest metabolic cause of encephalopathy.There was no seizure or seizure predisposition recorded on this study.   Antimicrobials:  Cefepime 7/17 >> 7/21   Ceftazidime 7/22 >> 7/31  Vancomycin 7/17 >> 7/31  Subjective: Traci Thomas requesting discontinuation of external catheter and advancement of diet. Per son, she is returning to her mental baseline. No fevers, cough improving.   Objective: Vitals:   01/15/17 0310 01/15/17 0402 01/15/17 0410 01/15/17 0904  BP:  (!) 155/85    Pulse:  90  97  Resp:  19  16  Temp:  98.6 F (37 C)    TempSrc:  Oral    SpO2: 93% 100%  94%  Weight:   57.4 kg (126 lb 8.7 oz)   Height:        Intake/Output Summary (Last 24 hours) at 01/15/17 1249 Last data filed at 01/15/17 1037  Gross per 24 hour  Intake              490 ml  Output             1075 ml  Net             -585 ml   Filed Weights   01/13/17 0532 01/14/17 0446 01/15/17 0410  Weight: 60.1 kg (132 lb 7.9 oz) 58.9 kg (129 lb 13.6 oz) 57.4 kg (126 lb 8.7 oz)    Examination: General exam: Chronically ill-appearing 66 y.o. female in no distress Respiratory system: Nonlabored 3L by [place]. Diffusely diminished but with no wheezing or crackles. Cardiovascular system: Regular rate and rhythm. No murmur, rub, or gallop. No JVD, and no pedal edema. Gastrointestinal system: Abdomen soft, non-tender, non-distended, with normoactive bowel sounds. No organomegaly or masses felt. Central nervous system: Alert and oriented. No focal neurological deficits. Extremities: Warm, no deformities Skin: No rashes, lesions no ulcers Psychiatry: Judgement and insight appear improved, able to follow conversation. Mood & affect appropriate.   CBC:  Recent Labs Lab  01/10/17 0350 01/11/17 0833 01/12/17 0430 01/13/17 0600  WBC 13.3* 15.5* 12.3* 13.0*  NEUTROABS  --  12.3*  --   --   HGB 9.5* 10.9* 9.6* 10.5*  HCT 32.0* 36.6 31.4* 34.0*  MCV 93.6 93.4 89.5 89.9  PLT 196 234 229 103   Basic Metabolic Panel:  Recent Labs Lab 01/10/17 0350 01/11/17 0833 01/12/17 0430 01/13/17 0600 01/14/17 0406 01/15/17 0316  NA 144 145  --  144 142 144  K 4.5 3.5  --  3.0* 4.2 4.0  CL 96* 92*  --  97* 99* 101  CO2 40* 43*  --  36* 34* 34*  GLUCOSE 141* 152*  --  157* 123* 127*  BUN 29* 20  --  _0 CREATININE 0.77 0.87 0.80 0.72 0.90 0.82  CALCIUM 8.8* 9.5  --  9.1 8.9 9.3  MG 2.1 2.1  --   --   --   --   PHOS  --  3.2  --   --   --   --    GFR: Estimated Creatinine Clearance: 53.5 mL/min (by C-G formula based on SCr of 0.82 mg/dL).   No results found for this or any previous visit (from the past 240 hour(s)).    Radiology Studies: No results found.  Scheduled Meds: . budesonide (PULMICORT) nebulizer solution  0.5 mg Nebulization BID  . chlorhexidine gluconate (MEDLINE KIT)  15  mL Mouth Rinse BID  . Chlorhexidine Gluconate Cloth  6 each Topical Daily  . feeding supplement  1 Container Oral TID BM  . furosemide  20 mg Oral Daily  . gabapentin  200 mg Oral Q6H  . insulin aspart  0-15 Units Subcutaneous TID WC  . insulin aspart  0-5 Units Subcutaneous QHS  . ipratropium-albuterol  3 mL Nebulization TID  . mouth rinse  15 mL Mouth Rinse QID  . metoprolol tartrate  12.5 mg Oral BID  . multivitamin with minerals  1 tablet Oral Daily  . oxybutynin  5 mg Oral Q breakfast  . pantoprazole  20 mg Oral Daily  . QUEtiapine  300 mg Oral QHS  . sodium chloride flush  10-40 mL Intracatheter Q12H  . sodium chloride flush  3 mL Intravenous Q12H  . venlafaxine  50 mg Oral BID WC   Continuous Infusions: . cefTAZidime (FORTAZ)  IV Stopped (01/15/17 1037)  . vancomycin Stopped (01/15/17 1005)     LOS: 12 days   Time spent: 25 minutes.  Vance Gather, MD [facility] Pager [phone]  If 7PM-7AM, please contact night-coverage [email or web address] Password [facility] 01/15/2017, 12:49 PM

## 2017-01-16 ENCOUNTER — Inpatient Hospital Stay (HOSPITAL_COMMUNITY): Payer: Medicare Other

## 2017-01-16 LAB — CBC
HEMATOCRIT: 33.1 % — AB (ref 36.0–46.0)
Hemoglobin: 10.2 g/dL — ABNORMAL LOW (ref 12.0–15.0)
MCH: 28 pg (ref 26.0–34.0)
MCHC: 30.8 g/dL (ref 30.0–36.0)
MCV: 90.9 fL (ref 78.0–100.0)
PLATELETS: 291 10*3/uL (ref 150–400)
RBC: 3.64 MIL/uL — ABNORMAL LOW (ref 3.87–5.11)
RDW: 17 % — AB (ref 11.5–15.5)
WBC: 9.6 10*3/uL (ref 4.0–10.5)

## 2017-01-16 LAB — GLUCOSE, CAPILLARY
GLUCOSE-CAPILLARY: 145 mg/dL — AB (ref 65–99)
GLUCOSE-CAPILLARY: 179 mg/dL — AB (ref 65–99)
GLUCOSE-CAPILLARY: 176 mg/dL — AB (ref 65–99)
Glucose-Capillary: 185 mg/dL — ABNORMAL HIGH (ref 65–99)
Glucose-Capillary: 134 mg/dL — ABNORMAL HIGH (ref 65–99)
Glucose-Capillary: 137 mg/dL — ABNORMAL HIGH (ref 65–99)

## 2017-01-16 LAB — BASIC METABOLIC PANEL
Anion gap: 8 (ref 5–15)
BUN: 12 mg/dL (ref 6–20)
CALCIUM: 9.2 mg/dL (ref 8.9–10.3)
CO2: 35 mmol/L — ABNORMAL HIGH (ref 22–32)
CREATININE: 0.82 mg/dL (ref 0.44–1.00)
Chloride: 98 mmol/L — ABNORMAL LOW (ref 101–111)
GFR calc Af Amer: >60 mL/min (ref >60)
GLUCOSE: 176 mg/dL — AB (ref 65–99)
POTASSIUM: 3.7 mmol/L (ref 3.5–5.1)
Sodium: 141 mmol/L (ref 135–145)

## 2017-01-16 LAB — PROTIME-INR
INR: 1.07
Prothrombin Time: 13.9 seconds (ref 11.4–15.2)

## 2017-01-16 MED ORDER — FENTANYL CITRATE (PF) 100 MCG/2ML IJ SOLN
INTRAMUSCULAR | Status: AC
  Filled 2017-01-16: qty 4

## 2017-01-16 MED ORDER — MIDAZOLAM HCL 2 MG/2ML IJ SOLN
INTRAMUSCULAR | Status: AC
  Filled 2017-01-16: qty 4

## 2017-01-16 MED ORDER — LIDOCAINE HCL 2 % IJ SOLN
INTRAMUSCULAR | Status: DC | PRN
  Administered 2017-01-16: 10 mL

## 2017-01-16 MED ORDER — FENTANYL CITRATE (PF) 100 MCG/2ML IJ SOLN
INTRAMUSCULAR | Status: AC | PRN
  Administered 2017-01-16: 25 ug via INTRAVENOUS

## 2017-01-16 MED ORDER — MIDAZOLAM HCL 2 MG/2ML IJ SOLN
INTRAMUSCULAR | Status: AC | PRN
  Administered 2017-01-16: 0.5 mg via INTRAVENOUS

## 2017-01-16 NOTE — Progress Notes (Signed)
PT Cancellation Note  Patient Details Name: Traci Thomas MRN: [id number] DOB: 12-18-1950   Cancelled Treatment:      CT guided left pelvic mass biopsy.   Will check back later as schedule permits.  Pt has been evaluated by PT with rec for SNF   Rica Koyanagi  PTA [facility]  Acute  Rehab Pager      [phone]

## 2017-01-16 NOTE — Progress Notes (Signed)
PROGRESS NOTE  Traci Thomas  ZOX:[id number] DOB: 09/19/1950 DOA: 01/03/2017 PCP: Garwin Brothers, MD   Brief Narrative: 66 year old woman PMH COPD, chronic respiratory failure on home oxygen, diabetes, dementia presented to the emergency department with shortness of breath, hypoxia. Placed on BiPAP, treated with antibiotics and referred for admission for respiratory acidosis, pneumonia, COPD exacerbation, acute respiratory failure. She decompensated, was transferred to critical care and intubated. Treated for MRSA and stenotrophomonas pneumonia. Subsequently extubated. She has improved with ongoing care for pneumonia, acute heart failure. Sepsis, COPD exacerbation, and acute encephalopathy have resolved. Work up for left flank pain demonstrated new left pelvic mass causing chronic hydronephrosis. No intervention is required per urology. IR has performed biopsy for diagnostic confirmation to guide discussions of palliative care options and prognostic indications.  Assessment & Plan: Principal Problem:   Acute respiratory failure with hypoxia and hypercapnia (HCC) Active Problems:   COPD exacerbation (HCC)   Diabetes mellitus type 2 in nonobese (HCC)   Stage 4 very severe COPD by GOLD classification ([place])   Lobar pneumonia (HCC)   Chronic respiratory failure with hypoxia (HCC)   Acute respiratory acidosis   Normocytic anemia   Gross hematuria   Hydronephrosis of left kidney   HCAP (healthcare-associated pneumonia)   Pelvic mass   Palliative care by specialist  Acute on chronic hypoxic, hypercapnic respiratory failure: secondary to lobar MRSA and stenotrophomonas pneumonia and COPD exacerbation and chronic GOLD IV COPD.  - Weaned IV steroids, no wheezing. Pulm signed off 7/27. - Continue bronchodilators. - Supplemental oxygen requirement has returned to baseline 3LPM.  - Continue antibiotics per ID thru PICC: vancomycin and ceftazidime. Plan to conclude 7/31  - Monitor sensitivities testing for  stenotrophomonas maltophila per ID recommendations. 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  - Will need follow up based on pathology report from CT-guided biopsy 7/30.  - Appreciate palliative care assistance for goals of care discussions. Will DC to SNF w/palliative care 7/31 if remains stable following biopsy.  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  - Urology consulted, no intervention planned due to pulmonary risk. If required for palliative reasons, could consider nephrostomy tube.  - Continue monitoring creatinine, with chronic nature, doubt left kidney is contributing to GFR and doubt that relieving obstruction would improve GFR, which remains within normal limits.   Sepsis secondary to lobar pneumonia, MRSA and stenotrophomonas - Sepsis appears resolved. Continue antibiotics as above.  Acute on chronic diastolic CHF: Resolved acute component. - Appears euvolemic, wt down from 139lbs at admission (had subsequently risen to 149lbs 7/23).  - Continue po lasix at home dose, monitor Cr.  Acute metabolic encephalopathy superimposed on dementia with associated myoclonic jerks. CT head no acute disease. EEG suggested metabolic encephalopathy. No seizure activity. -Per son this is a recurrent problem during illness, though has resolved.  Gross hematuria, acute.  - Lovenox discontinued. - Hemoglobin stable. Renal ultrasound noted, no stone on CT  Diabetes mellitus with peripheral neuropathy - Continue SSI. Anticipate improvement with tapering off steroids.  Normocytic anemia, baseline - Stable  DVT prophylaxis: SCDs Code Status: DNR/DNI Family Communication: Son at bedside Disposition Plan: Will DC to SNF in next 24 - 48 hours if remains improved, depending  on timing of and candidacy for biopsy per IR.  Consultants:  Pulmonary critical care medicine  Urology, Dr. MacDiarmid  IR  Procedures:  ETT 7/18 >> self-extubated 7/23  EEG Clinical Interpretation: This EEG is consistent with a moderate to severe generalized non-specific cerebral dysfunction(encephalopathy). Though non-specific, triphasic waves can suggest metabolic cause of encephalopathy.There was no seizure or seizure predisposition recorded on this study.   Antimicrobials:  Cefepime 7/17 >> 7/21   Ceftazidime 7/22 >> 7/31  Vancomycin 7/17 >> 7/31  Subjective: Pt requesting discontinuation of external catheter and advancement of diet. Per son, she is returning to her mental baseline. No fevers, cough improving.   Objective: Vitals:   01/16/17 1400 01/16/17 1410 01/16/17 1512 01/16/17 1536  BP: 125/68  129/67 126/74  Pulse: 71 71 76 84  Resp: 20 18 18 19  Temp: 98.9 F (37.2 C)  99.4 F (37.4 C) 98.4 F (36.9 C)  TempSrc: Oral  Oral Oral  SpO2: 97% 97% 99% 98%  Weight:      Height:        Intake/Output Summary (Last 24 hours) at 01/16/17 1544 Last data filed at 01/16/17 1541  Gross per 24 hour  Intake             1270 ml  Output             3125 ml  Net            -1855 ml   Filed Weights   01/14/17 0446 01/15/17 0410 01/16/17 0442  Weight: 58.9 kg (129 lb 13.6 oz) 57.4 kg (126 lb 8.7 oz) 58.6 kg (129 lb 3 oz)   General exam: Chronically ill-appearing 66 y.o. female in no distress sleeping supine. Respiratory system: Nonlabored 3L by [place]. Diffusely diminished but with no wheezing or crackles. Cardiovascular system: Regular rate and rhythm. No murmur, rub, or gallop. No JVD, and no pedal edema. Gastrointestinal system: Abdomen soft, non-tender, non-distended, with normoactive bowel sounds. No organomegaly or masses felt. Central nervous system: Alert and oriented. No focal neurological deficits. Extremities: Warm, no deformities Skin: No rashes,  lesions no ulcers Psychiatry: Judgement and insight appear improved, able to follow conversation, linear thought process. Mood & affect appropriate.   CBC:  Recent Labs Lab 01/10/17 0350 01/11/17 0833 01/12/17 0430 01/13/17 0600 01/16/17 0325  WBC 13.3* 15.5* 12.3* 13.0* 9.6  NEUTROABS  --  12.3*  --   --   --   HGB 9.5* 10.9* 9.6* 10.5* 10.2*  HCT 32.0* 36.6 31.4* 34.0* 33.1*  MCV 93.6 93.4 89.5 89.9 90.9  PLT 196 234 229 243 291   Basic Metabolic Panel:  Recent Labs Lab 01/10/17 0350 01/11/17 0833 01/12/17 0430 01/13/17 0600 01/14/17 0406 01/15/17 0316 01/16/17 0325  NA 144 145  --  144 142 144 141  K 4.5 3.5  --  3.0* 4.2 4.0 3.7  CL 96* 92*  --  97* 99* 101 98*  CO2 40* 43*  --  36* 34* 34* 35*  GLUCOSE 141* 152*  --  157* 123* 127* 176*  BUN 29* 20  --  12 16 15 12  CREATININE 0.77 0.87 0.80 0.72 0.90 0.82 0.82  CALCIUM 8.8* 9.5  --  9.1 8.9 9.3 9.2  MG 2.1 2.1  --   --   --   --   --   PHOS  --  3.2  --   --   --   --   --    GFR: Estimated Creatinine Clearance: 54.1 mL/min (by C-G formula based on SCr of 0.82 mg/dL).   No   results found for this or any previous visit (from the past 240 hour(s)).    Radiology Studies: Ct Biopsy  Result Date: 2017/02/04 INDICATION: 66 year old female with a history of new diagnosis left pelvis mass EXAM: CT BIOPSY MEDICATIONS: None. ANESTHESIA/SEDATION: Moderate (conscious) sedation was employed during this procedure. A total of Versed 0.5 mg and Fentanyl 25 mcg was administered intravenously. Moderate Sedation Time: 13 minutes. The patient's level of consciousness and vital signs were monitored continuously by radiology nursing throughout the procedure under my direct supervision. FLUOROSCOPY TIME:  CT COMPLICATIONS: None PROCEDURE: Informed written consent was obtained from the patient after a thorough discussion of the procedural risks, benefits and alternatives. All questions were addressed. Maximal Sterile Barrier Technique  was utilized including caps, mask, sterile gowns, sterile gloves, sterile drape, hand hygiene and skin antiseptic. A timeout was performed prior to the initiation of the procedure. Patient positioned supine position on CT gantry table. Scout CT images of the pelvis were performed with images stored sent to PACs. Patient was then prepped and draped in the usual sterile fashion. The skin and subcutaneous tissues were generously infiltrated 1% lidocaine for local anesthesia. Using CT guidance, 17 gauge guide needle was advanced into left pelvic mass. Multiple core biopsy were acquired. Needle was removed and a sterile bandage was placed. Patient tolerated the procedure well and remained hemodynamically stable throughout. No complications were encountered and no significant blood loss. IMPRESSION: Status post CT-guided biopsy of left pelvis mass. Tissue specimen sent to pathology for complete histopathologic analysis. Signed, Dulcy Fanny. Earleen Newport, DO [facility] Electronically Signed   By: Corrie Mckusick D.O.   On: 02-04-2017 12:23    Scheduled Meds: . budesonide (PULMICORT) nebulizer solution  0.5 mg Nebulization BID  . chlorhexidine gluconate (MEDLINE KIT)  15 mL Mouth Rinse BID  . Chlorhexidine Gluconate Cloth  6 each Topical Daily  . feeding supplement  1 Container Oral TID BM  . fentaNYL      . furosemide  20 mg Oral Daily  . gabapentin  200 mg Oral Q6H  . insulin aspart  0-15 Units Subcutaneous TID WC  . insulin aspart  0-5 Units Subcutaneous QHS  . ipratropium-albuterol  3 mL Nebulization TID  . mouth rinse  15 mL Mouth Rinse QID  . metoprolol tartrate  12.5 mg Oral BID  . midazolam      . multivitamin with minerals  1 tablet Oral Daily  . oxybutynin  5 mg Oral Q breakfast  . pantoprazole  20 mg Oral Daily  . QUEtiapine  300 mg Oral QHS  . sodium chloride flush  10-40 mL Intracatheter Q12H  . sodium chloride flush  3 mL Intravenous Q12H    . venlafaxine  50 mg Oral BID WC   Continuous Infusions: . cefTAZidime (FORTAZ)  IV Stopped (Feb 04, 2017 1022)     LOS: 13 days   Time spent: 25 minutes.  Vance Gather, MD [facility] Pager [phone]  If 7PM-7AM, please contact night-coverage [email or web address] Password [facility] Feb 04, 2017, 3:44 PM

## 2017-01-16 NOTE — Consult Note (Signed)
Chief Complaint: Patient was seen in consultation today for CT guided left pelvic mass biopsy Chief Complaint  Patient presents with  . Respiratory Distress  . Fever    Referring Physician(s): Grunz,R/Borden,L  Supervising Physician: Rolla Plate  Patient Status: [facility] - In-pt  History of Present Illness: Traci Thomas is a 66 y.o. 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 Pt is poor surgical candidate due to poor resp status and request now received for CT guided left pelvic mass biopsy for pathological confirmation. She is a DNR.  Past Medical History:  Diagnosis Date  . Asthma   . Back pain   . COPD (chronic obstructive pulmonary disease) (HCC)    Home O2 3lpm, theophylline  . Diabetes mellitus without complication [facility])     Past Surgical History:  Procedure Laterality Date  . APPENDECTOMY    . FEMUR IM NAIL Right 10/21/2015   Procedure: INTRAMEDULLARY (IM) NAIL FEMORAL;  Surgeon: Meredith Pel, MD;  Location: [facility] ORS;  Service: Orthopedics;  Laterality: Right;  . TONSILLECTOMY      Allergies: Adhesive [tape]; Ciprofloxacin; Latex; and Prednisone  Medications: Prior to Admission medications   Medication Sig Start Date End Date Taking? Authorizing Provider  acetaminophen (TYLENOL) 325 MG tablet Take 650 mg by mouth every 4 (four) hours as needed for fever.   Yes [provider]  albuterol (PROVENTIL HFA;VENTOLIN HFA) 108 (90 BASE) MCG/ACT inhaler Inhale 2 puffs into the lungs every 6 (six) hours as needed for wheezing or shortness of breath.   Yes [provider]  albuterol (PROVENTIL) (2.5 MG/3ML) 0.083% nebulizer solution Take 3 mLs (2.5 mg total) by nebulization every 4 (four) hours as  needed for wheezing or shortness of breath. 10/17/16  Yes Arrien, Jimmy Picket, MD  ALPRAZolam Duanne Moron) 0.5 MG tablet Take 1 tablet (0.5 mg total) by mouth 3 (three) times daily as needed for anxiety. Patient taking differently: Take 0.5 mg by mouth every 6 (six) hours.  10/17/16  Yes Arrien, Jimmy Picket, MD  azithromycin (ZITHROMAX) 500 MG tablet Take 500 mg by mouth daily.   Yes [provider]  CEFTAZIDIME IV Inject 1 application into the vein every 8 (eight) hours.   Yes [provider]  desvenlafaxine (PRISTIQ) 100 MG 24 hr tablet Take 100 mg by mouth daily with breakfast.    Yes [provider]  fluticasone (FLONASE) 50 MCG/ACT nasal spray Place 2 sprays into both nostrils daily.   Yes [provider]  fluticasone furoate-vilanterol (BREO ELLIPTA) 200-25 MCG/INH AEPB Inhale 1 puff into the lungs daily after breakfast.    Yes [provider]  furosemide (LASIX) 20 MG tablet Take 20 mg by mouth daily.   Yes [provider]  gabapentin (NEURONTIN) 100 MG capsule Take 200 mg by mouth 4 (four) times daily.    Yes [provider]  guaiFENesin (ROBITUSSIN) 100 MG/5ML liquid Take 200 mg by mouth every 4 (four) hours as needed for cough.   Yes [provider]  HYDROcodone-acetaminophen (NORCO/VICODIN) 5-325 MG tablet Take 1 tablet by mouth every 6 (six) hours as needed for moderate pain. Patient taking differently: Take 1 tablet by mouth every 6 (six) hours.  10/17/16  Yes Arrien, Jimmy Picket, MD  hydrocortisone 2.5 %  cream Apply 1 application topically 3 (three) times daily. Apply to face   Yes [provider]  hydroxypropyl methylcellulose / hypromellose (ISOPTO TEARS / GONIOVISC) 2.5 % ophthalmic solution Place 1 drop into both eyes every 12 (twelve) hours as needed for dry eyes.   Yes [provider]  insulin aspart (NOVOLOG) 100 UNIT/ML injection Inject 0-15 Units into the skin 3 (three) times daily with  meals. Glucose 150 to 200 use 2 units, for 201 to 250 use 4 units, for 251 to 300 use 6 units, for 301 to 350 use 8 units, for 351 or greater use 10 units. Patient taking differently: Inject 0-10 Units into the skin 3 (three) times daily with meals. Glucose 151 to 200 use 2 units, for 201 to 250 use 4 units, for 251 to 300 use 6 units, for 301 to 350 use 8 units, for 351 or greater use 10 units. Call MD if over 400 10/17/16  Yes Arrien, Jimmy Picket, MD  ipratropium-albuterol (DUONEB) 0.5-2.5 (3) MG/3ML SOLN Take 3 mLs by nebulization 4 (four) times daily.   Yes [provider]  levocetirizine (XYZAL) 5 MG tablet Take 5 mg by mouth every evening.   Yes [provider]  magnesium hydroxide (MILK OF MAGNESIA) 400 MG/5ML suspension Take 30 mLs by mouth as needed for mild constipation.   Yes [provider]  Menthol, Topical Analgesic, (ICY HOT EX) Apply 1 patch topically daily. Leave on for 8 hours   Yes [provider]  omeprazole (PRILOSEC) 20 MG capsule Take 20 mg by mouth daily before breakfast.   Yes [provider]  oxybutynin (DITROPAN-XL) 5 MG 24 hr tablet Take 5 mg by mouth daily with breakfast.   Yes [provider]  OXYGEN Inhale 6 L/min into the lungs continuous.   Yes [provider]  predniSONE (DELTASONE) 20 MG tablet Take 2 tablets (40 mg total) by mouth daily with breakfast. Take 2 tablets daily for 2 days, then 1 tablet daily for 2 days, then half tablet daily for 2 days. Patient taking differently: Take 40 mg by mouth daily with breakfast.  10/18/16  Yes Arrien, Jimmy Picket, MD  prochlorperazine (COMPAZINE) 10 MG tablet Take 10 mg by mouth every 4 (four) hours as needed for nausea or vomiting.   Yes [provider]  QUEtiapine (SEROQUEL) 300 MG tablet Take 300 mg by mouth at bedtime.   Yes [provider]  QUEtiapine (SEROQUEL) 50 MG tablet Take 50 mg by mouth daily with breakfast.   Yes [provider]  senna-docusate (SENOKOT-S) 8.6-50 MG tablet Take 2 tablets by mouth 2 (two) times daily. 10/24/15  Yes Florencia Reasons, MD  theophylline (THEODUR) 300 MG 12 hr tablet Take 300 mg by mouth 2 (two) times daily.   Yes [provider]  tiotropium (SPIRIVA HANDIHALER) 18 MCG inhalation capsule Place 1 capsule (18 mcg total) into inhaler and inhale daily. 03/31/15  Yes Short, Noah Delaine, MD  Vitamin D, Ergocalciferol, (DRISDOL) 50000 units CAPS capsule Take 50,000 Units by mouth every Sunday.    Yes [provider]  feeding supplement, ENSURE ENLIVE, (ENSURE ENLIVE) LIQD Take 237 mLs by mouth 2 (two) times daily between meals. Patient not taking: Reported on 11/30/2016 10/17/16   Arrien, Jimmy Picket, MD     Family History  Problem Relation Age of Onset  . COPD Mother   . Diabetes Unknown     Social History   Social History  . Marital status: Single  Spouse name: N/A  . Number of children: N/A  . Years of education: N/A   Social History Main Topics  . Smoking status: Former Smoker    Packs/day: 1.00    Types: Cigarettes    Quit date: 2016  . Smokeless tobacco: Never Used     Comment: started smoking 2017 to current sometimes 1 daily or once a month  . Alcohol use 0.0 oz/week     Comment: occasional beer  . Drug use: No  . Sexual activity: No   Other Topics Concern  . None   Social History Narrative  . None      Review of Systems per son currently denies CP, worsening dyspnea, cough, N/V or bleeding; she does have some occ left flank discomfort  Vital Signs: BP 120/72 (BP Location: Left Arm)   Pulse 95   Temp 99.1 F (37.3 C) (Oral)   Resp 16   Ht 4\' 11"  (1.499 m)   Wt 129 lb 3 oz (58.6 kg)   SpO2 96%   BMI 26.09 kg/m   Physical Exam pt asleep but arousable, son in room; chest- dim BS rt base with few exp wheezes; heart-nl S1/S2; abd- soft,+BS, currently NT  Mallampati Score:     Imaging: Dg Chest 2 View  Result Date:  01/03/2017 CLINICAL DATA:  66 year old female with cough and shortness of breath. EXAM: CHEST  2 VIEW COMPARISON:  Chest radiograph dated 11/02/2016 FINDINGS: Bilateral lower lobe hazy and streaky densities noted which have increased compared to the prior radiograph and may represent atelectasis versus developing infiltrate. Small pleural effusions may be present. There is no pneumothorax. The cardiac silhouette is within normal limits. Right-sided PICC with tip over central SVC. No acute osseous pathology. IMPRESSION: Interval increase in bibasilar densities compared to the prior radiograph, atelectasis versus infiltrate. Probable small pleural effusions. Clinical correlation and follow-up recommended. Electronically Signed   By: Anner Crete M.D.   On: 01/03/2017 05:42   Dg Abd 1 View  Result Date: 01/04/2017 CLINICAL DATA:  Orogastric tube placement EXAM: ABDOMEN - 1 VIEW COMPARISON:  10/07/2016 FINDINGS: An orogastric tube tip overlaps the distal stomach, which is somewhat low. Normal bowel gas pattern. Streaky lower lung opacities; there is contemporaneous chest x-ray. Nonobstructive bowel gas pattern. EKG leads create artifact over the abdomen chest. IMPRESSION: The orogastric tube tip overlaps the distal stomach. Electronically Signed   By: Monte Fantasia M.D.   On: 01/04/2017 17:09   Ct Head Wo Contrast  Result Date: 01/06/2017 CLINICAL DATA:  Agitation and confusion last night in patient admitted 01/03/2017 for respiratory failure. EXAM: CT HEAD WITHOUT CONTRAST TECHNIQUE: Contiguous axial images were obtained from the base of the skull through the vertex without intravenous contrast. COMPARISON:  Head CT scan 10/21/2015. FINDINGS: Brain: There is advanced for age cortical atrophy. Chronic microvascular ischemic change is identified. No evidence of acute abnormality including hemorrhage, infarct, mass lesion, mass effect, midline shift or abnormal extra-axial fluid collection. No hydrocephalus  or pneumocephalus. Vascular: Carotid atherosclerosis noted. Skull: Intact. Sinuses/Orbits: Mastoid effusions are unchanged since the prior head CT. Other: None. IMPRESSION: No acute abnormality. Chronic microvascular ischemic change and advanced for age cortical atrophy. Atherosclerosis. Chronic bilateral mastoid effusions. Electronically Signed   By: Inge Rise M.D.   On: 01/06/2017 14:53   Ct Abdomen Pelvis W Contrast  Result Date: 01/11/2017 CLINICAL DATA:  Known left hydronephrosis and pelvic mass EXAM: CT ABDOMEN AND PELVIS WITH CONTRAST TECHNIQUE: Multidetector CT imaging of the abdomen and  pelvis was performed using the standard protocol following bolus administration of intravenous contrast. CONTRAST:  100 mL Isovue-300 COMPARISON:  CT from earlier in the same day. FINDINGS: Lower chest: Small right pleural effusion and right lower lobe consolidation are again seen. Some right middle lobe consolidation is noted as well. Patchy infiltrate in the left lower lobe is also noted. Hepatobiliary: No focal liver abnormality is seen. No gallstones, gallbladder wall thickening, or biliary dilatation. Pancreas: Unremarkable. No pancreatic ductal dilatation or surrounding inflammatory changes. Spleen: Normal in size without focal abnormality. Adrenals/Urinary Tract: The adrenal glands are within normal limits. The right kidney demonstrates no renal calculi or obstructive changes. The left kidney again demonstrates significant hydronephrosis likely of the long-standing nature given the diffuse degree of cortical thinning. The hydronephrosis extends inferiorly and there is in growth into the mid to distal left ureter causing the obstructive change. This seen growth arises from the known pelvic mass lesion. Bladder is partially distended. An enhancing lesion is noted along the posterior aspect of the bladder near the trigone on the left. This is similar to that seen on the prior exam. Stomach/Bowel: No  obstructive or inflammatory changes are noted. Scattered diverticular change is seen. The appendix is not visualize consistent with a prior surgical history. Vascular/Lymphatic: The aorta demonstrates atherosclerotic changes. Reproductive: Uterus is not well visualized and has likely been surgically removed. Other: Enhancing pelvic mass lesion is again identified which measures at least 6.3 by 4.8 cm. It extends for approximately 9.5 cm in craniocaudad projection. It causes occlusion of the mid to distal left ureter and show some extension along the common iliac artery on the left. There is also soft tissue surrounding the common iliac artery on the left related to the mass lesion. It also extends inferiorly towards the posterior wall of the bladder similar to that seen on the prior exam. Musculoskeletal: Postsurgical changes in the proximal right femur are noted. No definitive osseous metastatic lesions are seen. Degenerative change of the lumbar spine is noted. IMPRESSION: Large enhancing pelvic mass lesion on the left. Again this is likely of left ovarian or transitional cell origin. Occlusion of the left ureter is noted. There is also extension towards the posterior aspect of the bladder wall near the trigone and UVJ. The lesion in develops portions of the iliac vasculature on the left. Compression upon the left iliac venous structures is noted although known deep venous thrombosis is seen. Stable bibasilar changes with associated right-sided effusion. Aortic Atherosclerosis (ICD10-170.0) Electronically Signed   By: Inez Catalina M.D.   On: 01/11/2017 20:51   US Renal  Result Date: 01/10/2017 CLINICAL DATA:  66 year old diabetic female with hematuria. Initial encounter. EXAM: RENAL / URINARY TRACT ULTRASOUND COMPLETE COMPARISON:  None. FINDINGS: Right Kidney: Length: 11.5 cm. Echogenicity within normal limits. No mass or hydronephrosis visualized. Left Kidney: Length: 12.2 cm. Marked left-sided  hydronephrosis. Etiology not demonstrated on the present exam. Renal parenchymal thinning. Evaluation of renal parenchyma is limited by patient's habitus and bowel gas. Bladder: Only right ureteral jet was visualized.  No obvious mass. IMPRESSION: Marked left-sided hydronephrosis. Etiology not demonstrated on the present exam. Renal parenchymal thinning. Evaluation of left renal parenchyma is limited by patient's habitus and bowel gas. Only right ureteral jet was visualized. Electronically Signed   By: Genia Del M.D.   On: 01/10/2017 17:46   Dg Chest Port 1 View  Result Date: 01/12/2017 CLINICAL DATA:  Respiratory failure. EXAM: PORTABLE CHEST 1 VIEW COMPARISON:  01/10/2017 . FINDINGS: Right  PICC line noted with tip projected over the superior vena cava. Heart size normal. Bibasilar atelectasis/ infiltrates unchanged. Small right pleural effusion. No pneumothorax. IMPRESSION: 1. Right PICC line stable position with tip projected superior vena cava. 2. Bibasilar atelectasis/ infiltrates unchanged. Small right pleural effusion . Electronically Signed   By: Marcello Moores  Register   On: 01/12/2017 07:03   Dg Chest Port 1 View  Result Date: 01/10/2017 CLINICAL DATA:  Acute respiratory failure with hypoxia EXAM: PORTABLE CHEST 1 VIEW COMPARISON:  01/08/2017 FINDINGS: Endotracheal tube and NG tube removed. Right arm PICC tip in the SVC unchanged. Bibasilar airspace disease unchanged. Negative for heart failure or effusion IMPRESSION: Endotracheal tube and NG tube removed No change in bibasilar atelectasis/ infiltrate. Electronically Signed   By: Franchot Gallo M.D.   On: 01/10/2017 07:14   Dg Chest Port 1 View  Result Date: 01/08/2017 CLINICAL DATA:  Acute respiratory failure with hypoxemia EXAM: PORTABLE CHEST 1 VIEW COMPARISON:  01/07/2017 FINDINGS: Endotracheal tube in good position. NG tube enters stomach. Right arm PICC tip in the SVC unchanged. Progression of mild bibasilar airspace disease. Upper lobes  clear. Negative for edema or effusion IMPRESSION: Progression of bibasilar atelectasis/ infiltrate. Electronically Signed   By: Franchot Gallo M.D.   On: 01/08/2017 07:06   Dg Chest Port 1 View  Result Date: 01/07/2017 CLINICAL DATA:  Acute respiratory failure EXAM: PORTABLE CHEST 1 VIEW COMPARISON:  01/06/2017 FINDINGS: Endotracheal tube remains low at the level of the carina, recommend withdrawal 4 cm. This is unchanged. Gastric tube in the stomach.  Right arm PICC tip in the SVC. Improvement in bibasilar airspace disease with improved lung volume. No heart failure or effusion IMPRESSION: Endotracheal tube remains low at the carina. Recommend withdrawal 4 cm. Improved aeration in the lung bases These results will be called to the ordering clinician or representative by the Radiologist Assistant, and communication documented in the PACS or zVision Dashboard. Electronically Signed   By: Franchot Gallo M.D.   On: 01/07/2017 07:15   Dg Chest Port 1 View  Result Date: 01/06/2017 CLINICAL DATA:  Acute respiratory failure EXAM: PORTABLE CHEST 1 VIEW COMPARISON:  01/05/2017 FINDINGS: Cardiac shadow is stable. Endotracheal tube and nasogastric catheter are again seen and stable. Right PICC line is again noted in satisfactory position. Mild bibasilar atelectatic changes are again seen. Small right pleural effusion is noted. No bony abnormality is noted. IMPRESSION: Overall stable appearance of the chest when compared with the prior exam. Electronically Signed   By: Inez Catalina M.D.   On: 01/06/2017 07:09   Portable Chest Xray  Result Date: 01/05/2017 CLINICAL DATA:  Respiratory failure. EXAM: PORTABLE CHEST 1 VIEW COMPARISON:  01/04/2017 . FINDINGS: Endotracheal tube, NG tube, right PICC line in stable position. Heart size normal. Low lung volumes . Bibasilar pulmonary infiltrates and bilateral pleural effusions again noted. No pneumothorax. IMPRESSION: 1. Lines and tubes in stable position. 2. Low lung volumes  with mild bibasilar atelectasis and infiltrates. Small bilateral pleural effusions. Chest is stable from prior exam . Electronically Signed   By: Marcello Moores  Register   On: 01/05/2017 07:29   Portable Chest Xray  Result Date: 01/04/2017 CLINICAL DATA:  Acute respiratory failure. Endotracheal tube placement EXAM: PORTABLE CHEST 1 VIEW COMPARISON:  01/04/2017 FINDINGS: Endotracheal tube in good position. Central venous catheter tip in the SVC unchanged. NG tube has been placed with tip in the stomach Progressive atelectasis in the lung bases bilaterally right greater than left. Negative for heart failure or  edema. Right pleural effusion. IMPRESSION: Endotracheal tube in good position Progression of bibasilar atelectasis Electronically Signed   By: Franchot Gallo M.D.   On: 01/04/2017 16:53   Dg Chest Port 1 View  Result Date: 01/04/2017 CLINICAL DATA:  Pneumonia, shortness of breath EXAM: PORTABLE CHEST 1 VIEW COMPARISON:  01/03/2017, 09/29/2016 FINDINGS: Bibasilar airspace disease similar in appearance of multiple prior examinations. Possible trace right pleural effusion. No pneumothorax. Stable cardiomediastinal silhouette. Right sided PICC line with the tip projecting over the SVC. IMPRESSION: Persistent bibasilar airspace disease which may reflect atelectasis versus scarring versus pneumonia. Electronically Signed   By: Kathreen Devoid   On: 01/04/2017 15:01   Ct Renal Stone Study  Result Date: 01/11/2017 CLINICAL DATA:  Gross hematuria. Left-sided hydronephrosis of unclear etiology and chronicity. History of COPD, diabetes and chronic respiratory failure. EXAM: CT ABDOMEN AND PELVIS WITHOUT CONTRAST TECHNIQUE: Multidetector CT imaging of the abdomen and pelvis was performed following the standard protocol without IV contrast. COMPARISON:  Renal ultrasound 01/10/2017.  Chest CT 10/24/2015. FINDINGS: Lower chest: Small right pleural effusion. There are right infrahilar airspace opacities within the right  middle and lower lobes with associated volume loss and central airway narrowing. There is lesser patchy airspace disease in the left lower lobe. The lung bases are incompletely visualized. Coronary artery calcifications are noted. Hepatobiliary: The liver appears unremarkable as imaged in the noncontrast state. No evidence of gallstones, gallbladder wall thickening or biliary dilatation. Pancreas: Unremarkable. No pancreatic ductal dilatation or surrounding inflammatory changes. Spleen: Normal in size without focal abnormality. Adrenals/Urinary Tract: Both adrenal glands appear normal. There is no evidence of urinary tract calculus. The right kidney appears unremarkable. The left kidney demonstrates marked cortical thinning and moderate hydronephrosis and hydroureter. The left ureter is dilated into the pelvis where there is a 7.1 x 4.9 cm mass (image 55), likely obstructing the ureter. The urinary bladder is distended. Contiguous with the left pelvic mass, there is dependent nodularity in the left bladder base near the trigone, measuring up to 20 mm on image 65. This could reflect tumor or blood clot. Stomach/Bowel: No evidence of bowel wall thickening, distention or surrounding inflammatory change. There are mild distal colonic diverticular changes. Vascular/Lymphatic: There are no discretely enlarged abdominal or pelvic lymph nodes. The left pelvic mass abuts the left femoral vasculature. Aortic and branch vessel atherosclerosis noted. Reproductive: Hysterectomy. Probable residual ovarian tissue on the right. The left ovary is not seen separate from the left adnexal mass described above. Other: No ascites or peritoneal nodularity. Musculoskeletal: No acute or significant osseous findings. Stable lower thoracic compression deformities. Previous proximal right femoral ORIF. IMPRESSION: 1. 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 The degree of hydronephrosis and cortical  thinning imply chronicity. 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 Lymphoma consider less likely. 2. Nodularity within the left bladder lumen could reflect distal extension of tumor or blood clot related to the patient's hematuria. Cystoscopy recommended. 3. No evidence of distant metastases or urinary tract calculus. 4. Bibasilar airspace opacities suspicious for chronic aspiration. Small right pleural effusion. Chest radiographic follow up recommended. 5.  Aortic Atherosclerosis (ICD10-I70.0). Electronically Signed   By: Richardean Sale M.D.   On: 01/11/2017 11:58    Labs:  CBC:  Recent Labs  01/11/17 0833 01/12/17 0430 01/13/17 0600 01/16/17 0325  WBC 15.5* 12.3* 13.0* 9.6  HGB 10.9* 9.6* 10.5* 10.2*  HCT 36.6 31.4* 34.0* 33.1*  PLT 234 229 243 291    COAGS:  Recent Labs  01/03/17 0441 01/10/17 1447 01/16/17 0325  INR 1.08 1.12 1.07  APTT  --  21*  --     BMP:  Recent Labs  01/13/17 0600 01/14/17 0406 01/15/17 0316 01/16/17 0325  NA 144 142 144 141  K 3.0* 4.2 4.0 3.7  CL 97* 99* 101 98*  CO2 36* 34* 34* 35*  GLUCOSE 157* 123* 127* 176*  BUN 12 16 15 12   CALCIUM 9.1 8.9 9.3 9.2  CREATININE 0.72 0.90 0.82 0.82  GFRNONAA >60 >60 >60 >60  GFRAA >60 >60 >60 >60    LIVER FUNCTION TESTS:  Recent Labs  09/28/16 0855 01/03/17 0441 01/11/17 0833  BILITOT 0.5 <0.1*  --   AST 16 10*  --   ALT 9* 9*  --   ALKPHOS 131* 77  --   PROT 8.0 6.9  --   ALBUMIN 4.1 3.2* 3.7    TUMOR MARKERS: No results for input(s): AFPTM, CEA, CA199, CHROMGRNA in the last 8760 hours.  Assessment and Plan: 66 y.o. 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 Pt is poor surgical candidate due to poor resp status and request now received for CT guided left pelvic mass biopsy for pathological confirmation. She is a DNR. Imaging studies have been reviewed by Dr. Earleen Newport. Risks and benefits discussed with the patient's son including, but not limited to bleeding, infection, damage to adjacent structures or low yield requiring additional tests. All of the patient's questions were answered, patient is agreeable to proceed. Consent signed and in chart.Procedure tent planned for later today with light sedation if necessary.      Thank you for this interesting consult.  I greatly enjoyed meeting LEANI MYRON and look forward to participating in their care.  A copy of this report was sent to the requesting provider on this date.  Electronically Signed: D. Rowe Robert, PA-C 01/16/2017, 10:13 AM   I spent a total of 25 minutes in face to face in clinical consultation, greater than 50% of which was counseling/coordinating care for CT guided left pelvic mass biopsy

## 2017-01-16 NOTE — Progress Notes (Signed)
Biopsy site clean/dry/intact, patient denies any pain/distress. Vitals WNL. Will continue to assess patient.

## 2017-01-16 NOTE — Procedures (Signed)
Interventional Radiology Procedure Note  Procedure: CT biopsy of left pelvic mass.  Complications: None Recommendations:  - Ok to shower tomorrow - Do not submerge for 7 days - Routine wound care   Signed,  Dulcy Fanny. Earleen Newport, DO

## 2017-01-16 NOTE — Care Management Important Message (Signed)
Important Message  Patient Details IM Letter given to Kathy/Case Manager to present to Patient Name: Traci Thomas MRN: [id number] Date of Birth: [birth year]   Medicare Important Message Given:  Yes    Kerin Salen 01/16/2017, 12:39 [place] Message  Patient Details  Name: Traci Thomas MRN: [id number] Date of Birth: 09/11/1950   Medicare Important Message Given:  Yes    Kerin Salen 01/16/2017, 12:39 PM

## 2017-01-17 ENCOUNTER — Telehealth: Payer: Self-pay | Admitting: Gynecologic Oncology

## 2017-01-17 ENCOUNTER — Encounter: Payer: Self-pay | Admitting: Gynecologic Oncology

## 2017-01-17 LAB — GLUCOSE, CAPILLARY
GLUCOSE-CAPILLARY: 166 mg/dL — AB (ref 65–99)
GLUCOSE-CAPILLARY: 292 mg/dL — AB (ref 65–99)

## 2017-01-17 MED ORDER — HYDROCODONE-ACETAMINOPHEN 5-325 MG PO TABS: 1.0000 | ORAL_TABLET | Freq: Four times a day (QID) | ORAL | 0 refills | Status: DC | PRN

## 2017-01-17 MED ORDER — ALPRAZOLAM 0.5 MG PO TABS: 0.5000 mg | ORAL_TABLET | Freq: Three times a day (TID) | ORAL | 0 refills | Status: AC | PRN

## 2017-01-17 MED ORDER — SENNOSIDES-DOCUSATE SODIUM 8.6-50 MG PO TABS: 2.0000 | ORAL_TABLET | Freq: Two times a day (BID) | ORAL | 0 refills | Status: AC | PRN

## 2017-01-17 NOTE — Care Management Note (Signed)
Case Management Note  Patient Details  Name: Traci Thomas MRN: [id number] Date of Birth: 1950/09/21  Subjective/Objective:                    Action/Plan:dc SNF.   Expected Discharge Date:  01/17/17               Expected Discharge Plan:  Skilled Nursing Facility  In-House Referral:  Clinical Social Work  Discharge planning Services  CM Consult  Post Acute Care Choice:    Choice offered to:     DME Arranged:    DME Agency:     HH Arranged:    [facility] Agency:     Status of Service:  Completed, signed off  If discussed at [employer], dates discussed:    Additional Comments:  Dessa Phi, RN 01/17/2017, 10:31 AM

## 2017-01-17 NOTE — Telephone Encounter (Signed)
Received a call from Dr. Bonner Puna to schedule the pt a gyn onc appt before hospital discharge. Pt has been scheduled to see Dr. Denman George on 8/8 at 1045am. Notified Melissa Cross about the pt's appt. She will watch for the path rpt to see if pt should be seem sooner. Letter mailed to the pt.

## 2017-01-17 NOTE — Discharge Summary (Addendum)
Physician Discharge Summary  Traci Thomas ELF:[id number] DOB: 1950/11/05 DOA: 01/03/2017  PCP: Garwin Brothers, MD  Admit date: 01/03/2017 Discharge date: 01/17/2017  Admitted From: SNF Disposition: SNF   Recommendations for Outpatient Follow-up:  1. Completed antibiotics 7/31 for sepsis due to pneumonia. PICC line pulled prior to discharge.  2. Follow up with urology for consideration of left nephrostomy tube is required.  3. Continue to have palliative care services following along at SNF 4. Please follow up on biopsy of pelvic mass performed 7/30. No results at time of discharge. Follow up arranged with Gyn/Onc as below to discuss results.  Home Health: N/A Equipment/Devices: None new, continue oxygen 3LPM Discharge Condition: Stable CODE STATUS: DNR Diet recommendation: Heart healthy, carbohydrate modified  Brief/Interim Summary: 66 year old woman PMH COPD, chronic respiratory failure on home oxygen, diabetes, dementia presented to the emergency department with shortness of breath, hypoxia. Placed on BiPAP, treated with antibiotics and referred for admission for respiratory acidosis, pneumonia, COPD exacerbation, acute respiratory failure. She decompensated, was transferred to critical care and intubated. Treated for MRSA and stenotrophomonas pneumonia. Subsequently extubated. She has improved with ongoing care for pneumonia, acute heart failure. Sepsis, COPD exacerbation, and acute encephalopathy have resolved. Work up for left flank pain demonstrated new left pelvic mass causing chronic hydronephrosis. No intervention is required per urology. IR has performed biopsy for diagnostic confirmation to guide discussions of palliative care options and prognostic implications. Results are pending at time of discharge, and will need to be followed up.  Discharge Diagnoses:  Principal Problem:   Acute respiratory failure with hypoxia and hypercapnia (HCC) Active Problems:   COPD exacerbation (HCC)    Diabetes mellitus type 2 in nonobese (HCC)   Stage 4 very severe COPD by GOLD classification ([place])   Lobar pneumonia (HCC)   Chronic respiratory failure with hypoxia (HCC)   Acute respiratory acidosis   Normocytic anemia   Gross hematuria   Hydronephrosis of left kidney   HCAP (healthcare-associated pneumonia)   Pelvic mass   Palliative care by specialist  Acute on chronic hypoxic, hypercapnic respiratory failure: secondary to lobar MRSA and stenotrophomonas pneumonia and COPD exacerbation and chronic GOLD IV COPD.  - Weaned steroids, no wheezing. Pulm signed off 7/27. - Continue bronchodilators: Breo, spiriva and prn albuterol - Supplemental oxygen requirement has returned to baseline 3LPM.  - Continue antibiotics per ID thru PICC: vancomycin and ceftazidime. Plan to conclude 7/31. - Monitor sensitivities testing for stenotrophomonas maltophila per ID recommendations. 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  - Will need follow up based on pathology report from CT-guided biopsy 7/30.   - Appreciate palliative care assistance for goals of care discussions. Will DC to SNF w/palliative care following  Left hydronephrosis due to ureteral occlusion due to mass: Suspect chronic.  - Urology consulted, no intervention planned due to pulmonary risk. If required for palliative reasons, could consider nephrostomy tube.  - Continue monitoring creatinine, with chronic nature, doubt left kidney is contributing to GFR and doubt that relieving obstruction would improve GFR, which remains within normal limits.   Sepsis secondary to lobar pneumonia, MRSA and stenotrophomonas - Sepsis appears resolved. Continue antibiotics as above.  Acute on chronic diastolic CHF: Resolved acute component. - Appears euvolemic, wt down  from 139lbs at admission (had subsequently risen to 149lbs 7/23).  - Continue po lasix at home dose,  monitor Cr.  Acute metabolic encephalopathy superimposed on dementia with associated myoclonic jerks. CT head no acute disease. EEG suggested metabolic encephalopathy. No seizure activity. - Per son this is a recurrent problem during illness, though has resolved.  Gross hematuria, acute.  - Lovenox discontinued. - Hemoglobin stable. Renal ultrasound noted, no stone on CT  Diabetes mellitus with peripheral neuropathy - Anticipate improvement with tapering off steroids.  Normocytic anemia, baseline - Stable  Discharge Instructions  Allergies as of 01/17/2017      Reactions   Adhesive [tape] Other (See Comments)   Skin peels off    Ciprofloxacin    Unknown reaction    Latex    Unknown reaction    Prednisone Nausea And Vomiting      Medication List    STOP taking these medications   azithromycin 500 MG tablet Commonly known as:  ZITHROMAX   CEFTAZIDIME IV   ipratropium-albuterol 0.5-2.5 (3) MG/3ML Soln Commonly known as:  DUONEB   predniSONE 20 MG tablet Commonly known as:  DELTASONE     TAKE these medications   acetaminophen 325 MG tablet Commonly known as:  TYLENOL Take 650 mg by mouth every 4 (four) hours as needed for fever.   albuterol 108 (90 Base) MCG/ACT inhaler Commonly known as:  PROVENTIL HFA;VENTOLIN HFA Inhale 2 puffs into the lungs every 6 (six) hours as needed for wheezing or shortness of breath.   albuterol (2.5 MG/3ML) 0.083% nebulizer solution Commonly known as:  PROVENTIL Take 3 mLs (2.5 mg total) by nebulization every 4 (four) hours as needed for wheezing or shortness of breath.   ALPRAZolam 0.5 MG tablet Commonly known as:  XANAX Take 1 tablet (0.5 mg total) by mouth 3 (three) times daily as needed for anxiety. What changed:  when to take this  reasons to take this   BREO ELLIPTA 200-25 MCG/INH Aepb Generic drug:  fluticasone  furoate-vilanterol Inhale 1 puff into the lungs daily after breakfast.   desvenlafaxine 100 MG 24 hr tablet Commonly known as:  PRISTIQ Take 100 mg by mouth daily with breakfast.   feeding supplement (ENSURE ENLIVE) Liqd Take 237 mLs by mouth 2 (two) times daily between meals.   fluticasone 50 MCG/ACT nasal spray Commonly known as:  FLONASE Place 2 sprays into both nostrils daily.   furosemide 20 MG tablet Commonly known as:  LASIX Take 20 mg by mouth daily.   gabapentin 100 MG capsule Commonly known as:  NEURONTIN Take 200 mg by mouth 4 (four) times daily.   guaiFENesin 100 MG/5ML liquid Commonly known as:  ROBITUSSIN Take 200 mg by mouth every 4 (four) hours as needed for cough.   HYDROcodone-acetaminophen 5-325 MG tablet Commonly known as:  NORCO/VICODIN Take 1 tablet by mouth every 6 (six) hours as needed for moderate pain. What changed:  when to take this  reasons to take this   hydrocortisone 2.5 % cream Apply 1 application topically 3 (three) times daily. Apply to face   hydroxypropyl methylcellulose / hypromellose 2.5 % ophthalmic solution Commonly known as:  ISOPTO TEARS / GONIOVISC Place 1 drop into both eyes every 12 (twelve) hours as needed for dry eyes.   ICY HOT EX Apply 1 patch topically daily. Leave on for 8 hours   insulin aspart 100 UNIT/ML injection Commonly known as:  novoLOG Inject 0-15 Units into the skin 3 (three) times daily with meals. Glucose 150 to 200 use 2 units, for 201 to 250 use 4 units, for 251 to 300 use  6 units, for 301 to 350 use 8 units, for 351 or greater use 10 units. What changed:  how much to take  additional instructions   levocetirizine 5 MG tablet Commonly known as:  XYZAL Take 5 mg by mouth every evening.   magnesium hydroxide 400 MG/5ML suspension Commonly known as:  MILK OF MAGNESIA Take 30 mLs by mouth as needed for mild constipation.   omeprazole 20 MG capsule Commonly known as:  PRILOSEC Take 20 mg by  mouth daily before breakfast.   oxybutynin 5 MG 24 hr tablet Commonly known as:  DITROPAN-XL Take 5 mg by mouth daily with breakfast.   OXYGEN Inhale 6 L/min into the lungs continuous.   prochlorperazine 10 MG tablet Commonly known as:  COMPAZINE Take 10 mg by mouth every 4 (four) hours as needed for nausea or vomiting.   QUEtiapine 50 MG tablet Commonly known as:  SEROQUEL Take 50 mg by mouth daily with breakfast.   QUEtiapine 300 MG tablet Commonly known as:  SEROQUEL Take 300 mg by mouth at bedtime.   senna-docusate 8.6-50 MG tablet Commonly known as:  Senokot-S Take 2 tablets by mouth 2 (two) times daily as needed for mild constipation. What changed:  when to take this  reasons to take this   theophylline 300 MG 12 hr tablet Commonly known as:  THEODUR Take 300 mg by mouth 2 (two) times daily.   tiotropium 18 MCG inhalation capsule Commonly known as:  SPIRIVA HANDIHALER Place 1 capsule (18 mcg total) into inhaler and inhale daily.   Vitamin D (Ergocalciferol) 50000 units Caps capsule Commonly known as:  DRISDOL Take 50,000 Units by mouth every Sunday.       Contact information for follow-up providers    Garwin Brothers, MD Follow up.   Specialty:  Internal Medicine Contact information: [address] [place] [address] [phone]        Everitt Amber, MD. Go on 01/25/2017.   Specialty:  Obstetrics and Gynecology Why:  for follow up of pelvic mass at 10:45, be here by 10:30am Contact information: [place] [address] [phone]            Contact information for after-discharge care    Destination    [facility] Follow up.   Specialty:  [place] information: [address] [place] [phone]                 Allergies  Allergen Reactions  . Adhesive [Tape] Other (See Comments)    Skin peels off   . Ciprofloxacin     Unknown reaction   .  Latex     Unknown reaction   . Prednisone Nausea And Vomiting    Consultations:  ETT 7/18 >> self-extubated 7/23  CT-guided biopsy of left pelvic mass by Dr. Earleen Newport 01/16/2017  EEG Clinical Interpretation: This EEG is consistent with a moderate to severe generalized non-specific cerebral dysfunction(encephalopathy). Though non-specific, triphasic waves can suggest metabolic cause of encephalopathy.There was no seizure or seizure predisposition recorded on this study.    Procedures/Studies: Dg Chest 2 View  Result Date: 01/03/2017 CLINICAL DATA:  66 year old female with cough and shortness of breath. EXAM: CHEST  2 VIEW COMPARISON:  Chest radiograph dated 11/02/2016 FINDINGS: Bilateral lower lobe hazy and streaky densities noted which have increased compared to the prior radiograph and may represent atelectasis versus developing infiltrate. Small pleural effusions may be present. There is no pneumothorax.  The cardiac silhouette is within normal limits. Right-sided PICC with tip over central SVC. No acute osseous pathology. IMPRESSION: Interval increase in bibasilar densities compared to the prior radiograph, atelectasis versus infiltrate. Probable small pleural effusions. Clinical correlation and follow-up recommended. Electronically Signed   By: Anner Crete M.D.   On: 01/03/2017 05:42   Dg Abd 1 View  Result Date: 01/04/2017 CLINICAL DATA:  Orogastric tube placement EXAM: ABDOMEN - 1 VIEW COMPARISON:  10/07/2016 FINDINGS: An orogastric tube tip overlaps the distal stomach, which is somewhat low. Normal bowel gas pattern. Streaky lower lung opacities; there is contemporaneous chest x-ray. Nonobstructive bowel gas pattern. EKG leads create artifact over the abdomen chest. IMPRESSION: The orogastric tube tip overlaps the distal stomach. Electronically Signed   By: Monte Fantasia M.D.   On: 01/04/2017 17:09   Ct Head Wo Contrast  Result Date: 01/06/2017 CLINICAL DATA:  Agitation and  confusion last night in patient admitted 01/03/2017 for respiratory failure. EXAM: CT HEAD WITHOUT CONTRAST TECHNIQUE: Contiguous axial images were obtained from the base of the skull through the vertex without intravenous contrast. COMPARISON:  Head CT scan 10/21/2015. FINDINGS: Brain: There is advanced for age cortical atrophy. Chronic microvascular ischemic change is identified. No evidence of acute abnormality including hemorrhage, infarct, mass lesion, mass effect, midline shift or abnormal extra-axial fluid collection. No hydrocephalus or pneumocephalus. Vascular: Carotid atherosclerosis noted. Skull: Intact. Sinuses/Orbits: Mastoid effusions are unchanged since the prior head CT. Other: None. IMPRESSION: No acute abnormality. Chronic microvascular ischemic change and advanced for age cortical atrophy. Atherosclerosis. Chronic bilateral mastoid effusions. Electronically Signed   By: Inge Rise M.D.   On: 01/06/2017 14:53   Ct Abdomen Pelvis W Contrast  Result Date: 01/11/2017 CLINICAL DATA:  Known left hydronephrosis and pelvic mass EXAM: CT ABDOMEN AND PELVIS WITH CONTRAST TECHNIQUE: Multidetector CT imaging of the abdomen and pelvis was performed using the standard protocol following bolus administration of intravenous contrast. CONTRAST:  100 mL Isovue-300 COMPARISON:  CT from earlier in the same day. FINDINGS: Lower chest: Small right pleural effusion and right lower lobe consolidation are again seen. Some right middle lobe consolidation is noted as well. Patchy infiltrate in the left lower lobe is also noted. Hepatobiliary: No focal liver abnormality is seen. No gallstones, gallbladder wall thickening, or biliary dilatation. Pancreas: Unremarkable. No pancreatic ductal dilatation or surrounding inflammatory changes. Spleen: Normal in size without focal abnormality. Adrenals/Urinary Tract: The adrenal glands are within normal limits. The right kidney demonstrates no renal calculi or obstructive  changes. The left kidney again demonstrates significant hydronephrosis likely of the long-standing nature given the diffuse degree of cortical thinning. The hydronephrosis extends inferiorly and there is in growth into the mid to distal left ureter causing the obstructive change. This seen growth arises from the known pelvic mass lesion. Bladder is partially distended. An enhancing lesion is noted along the posterior aspect of the bladder near the trigone on the left. This is similar to that seen on the prior exam. Stomach/Bowel: No obstructive or inflammatory changes are noted. Scattered diverticular change is seen. The appendix is not visualize consistent with a prior surgical history. Vascular/Lymphatic: The aorta demonstrates atherosclerotic changes. Reproductive: Uterus is not well visualized and has likely been surgically removed. Other: Enhancing pelvic mass lesion is again identified which measures at least 6.3 by 4.8 cm. It extends for approximately 9.5 cm in craniocaudad projection. It causes occlusion of the mid to distal left ureter and show some extension along the common iliac artery on  the left. There is also soft tissue surrounding the common iliac artery on the left related to the mass lesion. It also extends inferiorly towards the posterior wall of the bladder similar to that seen on the prior exam. Musculoskeletal: Postsurgical changes in the proximal right femur are noted. No definitive osseous metastatic lesions are seen. Degenerative change of the lumbar spine is noted. IMPRESSION: Large enhancing pelvic mass lesion on the left. Again this is likely of left ovarian or transitional cell origin. Occlusion of the left ureter is noted. There is also extension towards the posterior aspect of the bladder wall near the trigone and UVJ. The lesion in develops portions of the iliac vasculature on the left. Compression upon the left iliac venous structures is noted although known deep venous thrombosis  is seen. Stable bibasilar changes with associated right-sided effusion. Aortic Atherosclerosis (ICD10-170.0) Electronically Signed   By: Inez Catalina M.D.   On: 01/11/2017 20:51   US Renal  Result Date: 01/10/2017 CLINICAL DATA:  66 year old diabetic female with hematuria. Initial encounter. EXAM: RENAL / URINARY TRACT ULTRASOUND COMPLETE COMPARISON:  None. FINDINGS: Right Kidney: Length: 11.5 cm. Echogenicity within normal limits. No mass or hydronephrosis visualized. Left Kidney: Length: 12.2 cm. Marked left-sided hydronephrosis. Etiology not demonstrated on the present exam. Renal parenchymal thinning. Evaluation of renal parenchyma is limited by patient's habitus and bowel gas. Bladder: Only right ureteral jet was visualized.  No obvious mass. IMPRESSION: Marked left-sided hydronephrosis. Etiology not demonstrated on the present exam. Renal parenchymal thinning. Evaluation of left renal parenchyma is limited by patient's habitus and bowel gas. Only right ureteral jet was visualized. Electronically Signed   By: Genia Del M.D.   On: 01/10/2017 17:46   Ct Biopsy  Result Date: 01/16/2017 INDICATION: 66 year old female with a history of new diagnosis left pelvis mass EXAM: CT BIOPSY MEDICATIONS: None. ANESTHESIA/SEDATION: Moderate (conscious) sedation was employed during this procedure. A total of Versed 0.5 mg and Fentanyl 25 mcg was administered intravenously. Moderate Sedation Time: 13 minutes. The patient's level of consciousness and vital signs were monitored continuously by radiology nursing throughout the procedure under my direct supervision. FLUOROSCOPY TIME:  CT COMPLICATIONS: None PROCEDURE: Informed written consent was obtained from the patient after a thorough discussion of the procedural risks, benefits and alternatives. All questions were addressed. Maximal Sterile Barrier Technique was utilized including caps, mask, sterile gowns, sterile gloves, sterile drape, hand hygiene and skin  antiseptic. A timeout was performed prior to the initiation of the procedure. Patient positioned supine position on CT gantry table. Scout CT images of the pelvis were performed with images stored sent to PACs. Patient was then prepped and draped in the usual sterile fashion. The skin and subcutaneous tissues were generously infiltrated 1% lidocaine for local anesthesia. Using CT guidance, 17 gauge guide needle was advanced into left pelvic mass. Multiple core biopsy were acquired. Needle was removed and a sterile bandage was placed. Patient tolerated the procedure well and remained hemodynamically stable throughout. No complications were encountered and no significant blood loss. IMPRESSION: Status post CT-guided biopsy of left pelvis mass. Tissue specimen sent to pathology for complete histopathologic analysis. Signed, Dulcy Fanny. Earleen Newport, DO [facility] Electronically Signed   By: Corrie Mckusick D.O.   On: 01/16/2017 12:23   Dg Chest Port 1 View  Result Date: 01/12/2017 CLINICAL DATA:  Respiratory failure. EXAM: PORTABLE CHEST 1 VIEW COMPARISON:  01/10/2017 . FINDINGS: Right PICC line noted with tip projected over the superior vena  cava. Heart size normal. Bibasilar atelectasis/ infiltrates unchanged. Small right pleural effusion. No pneumothorax. IMPRESSION: 1. Right PICC line stable position with tip projected superior vena cava. 2. Bibasilar atelectasis/ infiltrates unchanged. Small right pleural effusion . Electronically Signed   By: Marcello Moores  Register   On: 01/12/2017 07:03   Dg Chest Port 1 View  Result Date: 01/10/2017 CLINICAL DATA:  Acute respiratory failure with hypoxia EXAM: PORTABLE CHEST 1 VIEW COMPARISON:  01/08/2017 FINDINGS: Endotracheal tube and NG tube removed. Right arm PICC tip in the SVC unchanged. Bibasilar airspace disease unchanged. Negative for heart failure or effusion IMPRESSION: Endotracheal tube and NG tube removed No change  in bibasilar atelectasis/ infiltrate. Electronically Signed   By: Franchot Gallo M.D.   On: 01/10/2017 07:14   Dg Chest Port 1 View  Result Date: 01/08/2017 CLINICAL DATA:  Acute respiratory failure with hypoxemia EXAM: PORTABLE CHEST 1 VIEW COMPARISON:  01/07/2017 FINDINGS: Endotracheal tube in good position. NG tube enters stomach. Right arm PICC tip in the SVC unchanged. Progression of mild bibasilar airspace disease. Upper lobes clear. Negative for edema or effusion IMPRESSION: Progression of bibasilar atelectasis/ infiltrate. Electronically Signed   By: Franchot Gallo M.D.   On: 01/08/2017 07:06   Dg Chest Port 1 View  Result Date: 01/07/2017 CLINICAL DATA:  Acute respiratory failure EXAM: PORTABLE CHEST 1 VIEW COMPARISON:  01/06/2017 FINDINGS: Endotracheal tube remains low at the level of the carina, recommend withdrawal 4 cm. This is unchanged. Gastric tube in the stomach.  Right arm PICC tip in the SVC. Improvement in bibasilar airspace disease with improved lung volume. No heart failure or effusion IMPRESSION: Endotracheal tube remains low at the carina. Recommend withdrawal 4 cm. Improved aeration in the lung bases These results will be called to the ordering clinician or representative by the Radiologist Assistant, and communication documented in the PACS or zVision Dashboard. Electronically Signed   By: Franchot Gallo M.D.   On: 01/07/2017 07:15   Dg Chest Port 1 View  Result Date: 01/06/2017 CLINICAL DATA:  Acute respiratory failure EXAM: PORTABLE CHEST 1 VIEW COMPARISON:  01/05/2017 FINDINGS: Cardiac shadow is stable. Endotracheal tube and nasogastric catheter are again seen and stable. Right PICC line is again noted in satisfactory position. Mild bibasilar atelectatic changes are again seen. Small right pleural effusion is noted. No bony abnormality is noted. IMPRESSION: Overall stable appearance of the chest when compared with the prior exam. Electronically Signed   By: Inez Catalina M.D.    On: 01/06/2017 07:09   Portable Chest Xray  Result Date: 01/05/2017 CLINICAL DATA:  Respiratory failure. EXAM: PORTABLE CHEST 1 VIEW COMPARISON:  01/04/2017 . FINDINGS: Endotracheal tube, NG tube, right PICC line in stable position. Heart size normal. Low lung volumes . Bibasilar pulmonary infiltrates and bilateral pleural effusions again noted. No pneumothorax. IMPRESSION: 1. Lines and tubes in stable position. 2. Low lung volumes with mild bibasilar atelectasis and infiltrates. Small bilateral pleural effusions. Chest is stable from prior exam . Electronically Signed   By: Marcello Moores  Register   On: 01/05/2017 07:29   Portable Chest Xray  Result Date: 01/04/2017 CLINICAL DATA:  Acute respiratory failure. Endotracheal tube placement EXAM: PORTABLE CHEST 1 VIEW COMPARISON:  01/04/2017 FINDINGS: Endotracheal tube in good position. Central venous catheter tip in the SVC unchanged. NG tube has been placed with tip in the stomach Progressive atelectasis in the lung bases bilaterally right greater than left. Negative for heart failure or edema. Right pleural effusion. IMPRESSION: Endotracheal tube in good position  Progression of bibasilar atelectasis Electronically Signed   By: Franchot Gallo M.D.   On: 01/04/2017 16:53   Dg Chest Port 1 View  Result Date: 01/04/2017 CLINICAL DATA:  Pneumonia, shortness of breath EXAM: PORTABLE CHEST 1 VIEW COMPARISON:  01/03/2017, 09/29/2016 FINDINGS: Bibasilar airspace disease similar in appearance of multiple prior examinations. Possible trace right pleural effusion. No pneumothorax. Stable cardiomediastinal silhouette. Right sided PICC line with the tip projecting over the SVC. IMPRESSION: Persistent bibasilar airspace disease which may reflect atelectasis versus scarring versus pneumonia. Electronically Signed   By: Kathreen Devoid   On: 01/04/2017 15:01   Ct Renal Stone Study  Result Date: 01/11/2017 CLINICAL DATA:  Gross hematuria. Left-sided hydronephrosis of unclear  etiology and chronicity. History of COPD, diabetes and chronic respiratory failure. EXAM: CT ABDOMEN AND PELVIS WITHOUT CONTRAST TECHNIQUE: Multidetector CT imaging of the abdomen and pelvis was performed following the standard protocol without IV contrast. COMPARISON:  Renal ultrasound 01/10/2017.  Chest CT 10/24/2015. FINDINGS: Lower chest: Small right pleural effusion. There are right infrahilar airspace opacities within the right middle and lower lobes with associated volume loss and central airway narrowing. There is lesser patchy airspace disease in the left lower lobe. The lung bases are incompletely visualized. Coronary artery calcifications are noted. Hepatobiliary: The liver appears unremarkable as imaged in the noncontrast state. No evidence of gallstones, gallbladder wall thickening or biliary dilatation. Pancreas: Unremarkable. No pancreatic ductal dilatation or surrounding inflammatory changes. Spleen: Normal in size without focal abnormality. Adrenals/Urinary Tract: Both adrenal glands appear normal. There is no evidence of urinary tract calculus. The right kidney appears unremarkable. The left kidney demonstrates marked cortical thinning and moderate hydronephrosis and hydroureter. The left ureter is dilated into the pelvis where there is a 7.1 x 4.9 cm mass (image 55), likely obstructing the ureter. The urinary bladder is distended. Contiguous with the left pelvic mass, there is dependent nodularity in the left bladder base near the trigone, measuring up to 20 mm on image 65. This could reflect tumor or blood clot. Stomach/Bowel: No evidence of bowel wall thickening, distention or surrounding inflammatory change. There are mild distal colonic diverticular changes. Vascular/Lymphatic: There are no discretely enlarged abdominal or pelvic lymph nodes. The left pelvic mass abuts the left femoral vasculature. Aortic and branch vessel atherosclerosis noted. Reproductive: Hysterectomy. Probable residual  ovarian tissue on the right. The left ovary is not seen separate from the left adnexal mass described above. Other: No ascites or peritoneal nodularity. Musculoskeletal: No acute or significant osseous findings. Stable lower thoracic compression deformities. Previous proximal right femoral ORIF. IMPRESSION: 1. 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 The degree of hydronephrosis and cortical thinning imply chronicity. 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 Lymphoma consider less likely. 2. Nodularity within the left bladder lumen could reflect distal extension of tumor or blood clot related to the patient's hematuria. Cystoscopy recommended. 3. No evidence of distant metastases or urinary tract calculus. 4. Bibasilar airspace opacities suspicious for chronic aspiration. Small right pleural effusion. Chest radiographic follow up recommended. 5.  Aortic Atherosclerosis (ICD10-I70.0). Electronically Signed   By: Richardean Sale M.D.   On: 01/11/2017 11:58     ETT 7/18 >> self-extubated 7/23  EEG Clinical Interpretation: This EEG is consistent with a moderate to severe generalized non-specific cerebral dysfunction(encephalopathy). Though non-specific, triphasic waves can suggest metabolic cause of encephalopathy.There was no seizure or seizure predisposition recorded on this study.  Subjective: No events overnight, breathing is at baseline. Has stable chronic lower back pain. No pain related to biopsy, no bleeding, no fever.   Discharge Exam: BP 115/63 (BP Location: Left Arm)   Pulse 89   Temp 99.5 F (37.5 C) (Oral)   Resp 16   Ht 4\' 11"  (1.499 m)   Wt 58.2 kg (128 lb 4.9 oz)   SpO2 99%   BMI 25.91 kg/m   General: Chronically ill-appearing 66yo F in no distress Cardiovascular: RRR, no JVD or edema Respiratory: Nonlabored on 3L by [place], diffusely diminished without  wheezing or crackles Abdominal: Soft, NT, ND, +BS, biopsy site in LLQ c/d/i. Neuro: Alert, oriented. No focal deficits.   Labs: Basic Metabolic Panel:  Recent Labs Lab 01/11/17 0833 01/12/17 0430 01/13/17 0600 01/14/17 0406 01/15/17 0316 01/16/17 0325  NA 145  --  144 142 144 141  K 3.5  --  3.0* 4.2 4.0 3.7  CL 92*  --  97* 99* 101 98*  CO2 43*  --  36* 34* 34* 35*  GLUCOSE 152*  --  157* 123* 127* 176*  BUN 20  --  12 16 15 12   CREATININE 0.87 0.80 0.72 0.90 0.82 0.82  CALCIUM 9.5  --  9.1 8.9 9.3 9.2  MG 2.1  --   --   --   --   --   PHOS 3.2  --   --   --   --   --    Liver Function Tests:  Recent Labs Lab 01/11/17 0833  ALBUMIN 3.7   CBC:  Recent Labs Lab 01/11/17 0833 01/12/17 0430 01/13/17 0600 01/16/17 0325  WBC 15.5* 12.3* 13.0* 9.6  NEUTROABS 12.3*  --   --   --   HGB 10.9* 9.6* 10.5* 10.2*  HCT 36.6 31.4* 34.0* 33.1*  MCV 93.4 89.5 89.9 90.9  PLT 234 229 243 291   CBG:  Recent Labs Lab 01/16/17 0749 01/16/17 1221 01/16/17 1644 01/16/17 2142 01/17/17 0743  GLUCAP 176* 185* 134* 137* 166*   Urinalysis    Component Value Date/Time   COLORURINE STRAW (A) 01/03/2017 1630   APPEARANCEUR CLEAR 01/03/2017 1630   LABSPEC 1.010 01/03/2017 1630   PHURINE 5.0 01/03/2017 1630   GLUCOSEU NEGATIVE 01/03/2017 1630   HGBUR SMALL (A) 01/03/2017 1630   BILIRUBINUR NEGATIVE 01/03/2017 1630   KETONESUR 5 (A) 01/03/2017 1630   PROTEINUR 30 (A) 01/03/2017 1630   UROBILINOGEN 0.2 03/26/2015 1146   NITRITE NEGATIVE 01/03/2017 1630   LEUKOCYTESUR NEGATIVE 01/03/2017 1630    Time coordinating discharge: Approximately 40 minutes  Vance Gather, MD  [facility] 01/17/2017, 10:56 AM Pager [phone]

## 2017-01-17 NOTE — Clinical Social Work Placement (Signed)
Patient received and accepted bed offer at [facility]. [facility] contacted, family notified. Patient's RN can call report to [phone], room 504B.Packet complete.  CLINICAL SOCIAL WORK PLACEMENT  NOTE  Date:  01/17/2017  Patient Details  Name: Traci Thomas MRN: [id number] Date of Birth: 1951-02-29  Clinical Social Work is seeking post-discharge placement for this patient at the [place] level of care (*CSW will initial, date and re-position this form in  chart as items are completed):  Yes   Patient/family provided with [facility] Work Department's list of facilities offering this level of care within the geographic area requested by the patient (or if unable, by the patient's family).  Yes   Patient/family informed of their freedom to choose among providers that offer the needed level of care, that participate in Medicare, Medicaid or managed care program needed by the patient, have an available bed and are willing to accept the patient.  Yes   Patient/family informed of [facility]'s ownership interest in [facility] and [facility], as well as of the fact that they are under no obligation to receive care at these facilities.  PASRR submitted to EDS on       PASRR number received on       Existing PASRR number confirmed on 01/10/17     FL2 transmitted to all facilities in geographic area requested by pt/family on 01/10/17     FL2 transmitted to all facilities within larger geographic area on       Patient informed that his/her managed care company has contracts with or will negotiate with certain facilities, including the following:        Yes   Patient/family informed of bed offers received.  Patient chooses bed at [facility]     Physician recommends and patient chooses bed at      Patient to be transferred to [facility] on 01/17/17.  Patient to be transferred to facility by [facility]     Patient family notified on 01/17/17 of  transfer.  Name of family member notified:  [name]     PHYSICIAN       Additional Comment:    _______________________________________________ Burnis Medin, LCSW 01/17/2017, 12:02 PM

## 2017-01-18 LAB — MISC LABCORP TEST (SEND OUT): LABCORP TEST CODE: 88013

## 2017-01-18 LAB — THEOPHYLLINE LEVEL: THEOPHYLLINE LVL: 1.3 ug/mL

## 2017-01-20 ENCOUNTER — Telehealth: Payer: Self-pay | Admitting: Oncology

## 2017-01-20 ENCOUNTER — Encounter: Payer: Self-pay | Admitting: Oncology

## 2017-01-20 NOTE — Telephone Encounter (Signed)
Appt has been scheduled for the pt to see Dr. Alen Blew on 8/14 at 2pm. Original appt was scheduled with Dr. Bonner Puna, who called from the hospital, for the pt to see Dr. Denman George because he suspected the pt needed to be seen by gyn onc. Received a referral from Dr. Lynne Logan office. Appt with Dr. Denman George was then canceled. Attempted to call the pt, but left a detailed vm to let her know, appt with Dr. Denman George on 8/8 has been canceled and new appt with Dr. Alen Blew. Will mail the pt a letter and fax to the referring to notify the pt.

## 2017-01-25 ENCOUNTER — Ambulatory Visit: Payer: Medicare Other | Admitting: Gynecologic Oncology

## 2017-01-31 ENCOUNTER — Encounter: Payer: Self-pay | Admitting: Radiation Oncology

## 2017-01-31 ENCOUNTER — Ambulatory Visit (HOSPITAL_BASED_OUTPATIENT_CLINIC_OR_DEPARTMENT_OTHER): Payer: Medicare Other | Admitting: Oncology

## 2017-01-31 ENCOUNTER — Telehealth: Payer: Self-pay | Admitting: Oncology

## 2017-01-31 VITALS — BP 138/75 | HR 133 | Temp 98.4°F | Resp 17 | Ht 59.0 in | Wt 129.9 lb

## 2017-01-31 DIAGNOSIS — C763 Malignant neoplasm of pelvis: Secondary | ICD-10-CM

## 2017-01-31 DIAGNOSIS — E119 Type 2 diabetes mellitus without complications: Secondary | ICD-10-CM

## 2017-01-31 DIAGNOSIS — R102 Pelvic and perineal pain: Secondary | ICD-10-CM | POA: Diagnosis not present

## 2017-01-31 DIAGNOSIS — J449 Chronic obstructive pulmonary disease, unspecified: Secondary | ICD-10-CM | POA: Diagnosis not present

## 2017-01-31 DIAGNOSIS — K5900 Constipation, unspecified: Secondary | ICD-10-CM

## 2017-01-31 DIAGNOSIS — R109 Unspecified abdominal pain: Secondary | ICD-10-CM | POA: Diagnosis not present

## 2017-01-31 DIAGNOSIS — Z87891 Personal history of nicotine dependence: Secondary | ICD-10-CM

## 2017-01-31 DIAGNOSIS — R1900 Intra-abdominal and pelvic swelling, mass and lump, unspecified site: Secondary | ICD-10-CM

## 2017-01-31 NOTE — Telephone Encounter (Signed)
Gave patient/relative avs report and appointments for November and 8/29 with radonc.

## 2017-01-31 NOTE — Progress Notes (Signed)
Reason for Referral: Left kidney mass.   HPI: 66 year old woman currently of [place] where she lived the majority of her life. She has a history of COPD, diabetes among multiple comorbid conditions. She currently resides in a skilled nursing facility and have done so for the last 2 years. She had been in poor health previously was under hospice care for a period of time before she was discharged from their care. She was hospitalized on 01/03/2017 to 01/17/2017 with symptoms of shortness of breath, hypoxia and respiratory failure. During her evaluation she also developed hematuria and underwent imaging studies including a CT scan of the abdomen and pelvis on 01/11/2017. The scan showed a large enhancing pelvic mass arising from the left genitourinary tract or left ovary. Hydronephrosis was noted and the mass was measuring 9.5 cm in the craniocaudal projection. It causes occlusion of the mid to distal left ureter. No lymphadenopathy noted and chest x-ray did not show any evidence of measurable disease. 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 Since her discharge, she is currently residing in a skilled nursing facility and she is wheelchair bound. She is on 3 L of oxygen continuously. She does report left sided pelvic pain which is rather constant. She also reports complaints of constipation and dark urine. Her appetite is reasonable that her quality of life overall is poor.  She is not report any headaches, blurry vision, syncope or seizures. She is not reporting any fevers, chills or sweats. She does not report any chest pain, orthopnea or leg edema. She does not report any cough, wheezing but does report dyspnea on exertion and shortness of breath. She does not report any nausea, vomiting or abdominal pain. She does report flank pain. She does not report any hematochezia or melena. She does report constipation. She does not report  any hematuria but does report darkening of her urine.   Past Medical History:  Diagnosis Date  . Asthma   . Back pain   . COPD (chronic obstructive pulmonary disease) (HCC)    Home O2 3lpm, theophylline  . Diabetes mellitus without complication ([place])   :  Past Surgical History:  Procedure Laterality Date  . APPENDECTOMY    . FEMUR IM NAIL Right 10/21/2015   Procedure: INTRAMEDULLARY (IM) NAIL FEMORAL;  Surgeon: Meredith Pel, MD;  Location: [facility];  Service: Orthopedics;  Laterality: Right;  . TONSILLECTOMY    :   Current Outpatient Prescriptions:  .  acetaminophen (TYLENOL) 325 MG tablet, Take 650 mg by mouth every 4 (four) hours as needed for fever., Disp: , Rfl:  .  albuterol (PROVENTIL HFA;VENTOLIN HFA) 108 (90 BASE) MCG/ACT inhaler, Inhale 2 puffs into the lungs every 6 (six) hours as needed for wheezing or shortness of breath., Disp: , Rfl:  .  albuterol (PROVENTIL) (2.5 MG/3ML) 0.083% nebulizer solution, Take 3 mLs (2.5 mg total) by nebulization every 4 (four) hours as needed for wheezing or shortness of breath., Disp: 75 mL, Rfl: 12 .  ALPRAZolam (XANAX) 0.5 MG tablet, Take 1 tablet (0.5 mg total) by mouth 3 (three) times daily as needed for anxiety., Disp: 10 tablet, Rfl: 0 .  desvenlafaxine (PRISTIQ) 100 MG 24 hr tablet, Take 100 mg by mouth daily with breakfast. , Disp: , Rfl:  .  feeding supplement, ENSURE ENLIVE, (ENSURE ENLIVE) LIQD, Take 237 mLs by mouth 2 (two) times daily between meals. (Patient not taking: Reported on 11/30/2016), Disp: 237 mL, Rfl: 12 .  fluticasone (FLONASE) 50 MCG/ACT nasal spray, Place 2 sprays into both nostrils daily., Disp: , Rfl:  .  fluticasone furoate-vilanterol (BREO ELLIPTA) 200-25 MCG/INH AEPB, Inhale 1 puff into the lungs daily after breakfast. , Disp: , Rfl:  .  furosemide (LASIX) 20 MG tablet, Take 20 mg by mouth daily., Disp: , Rfl:  .  gabapentin (NEURONTIN) 100 MG capsule, Take 200 mg by mouth 4 (four) times daily. , Disp: , Rfl:   .  guaiFENesin (ROBITUSSIN) 100 MG/5ML liquid, Take 200 mg by mouth every 4 (four) hours as needed for cough., Disp: , Rfl:  .  HYDROcodone-acetaminophen (NORCO/VICODIN) 5-325 MG tablet, Take 1 tablet by mouth every 6 (six) hours as needed for moderate pain., Disp: 10 tablet, Rfl: 0 .  hydrocortisone 2.5 % cream, Apply 1 application topically 3 (three) times daily. Apply to face, Disp: , Rfl:  .  hydroxypropyl methylcellulose / hypromellose (ISOPTO TEARS / GONIOVISC) 2.5 % ophthalmic solution, Place 1 drop into both eyes every 12 (twelve) hours as needed for dry eyes., Disp: , Rfl:  .  insulin aspart (NOVOLOG) 100 UNIT/ML injection, Inject 0-15 Units into the skin 3 (three) times daily with meals. Glucose 150 to 200 use 2 units, for 201 to 250 use 4 units, for 251 to 300 use 6 units, for 301 to 350 use 8 units, for 351 or greater use 10 units. (Patient taking differently: Inject 0-10 Units into the skin 3 (three) times daily with meals. Glucose 151 to 200 use 2 units, for 201 to 250 use 4 units, for 251 to 300 use 6 units, for 301 to 350 use 8 units, for 351 or greater use 10 units. Call MD if over 400), Disp: 10 mL, Rfl: 11 .  levocetirizine (XYZAL) 5 MG tablet, Take 5 mg by mouth every evening., Disp: , Rfl:  .  magnesium hydroxide (MILK OF MAGNESIA) 400 MG/5ML suspension, Take 30 mLs by mouth as needed for mild constipation., Disp: , Rfl:  .  Menthol, Topical Analgesic, (ICY HOT EX), Apply 1 patch topically daily. Leave on for 8 hours, Disp: , Rfl:  .  omeprazole (PRILOSEC) 20 MG capsule, Take 20 mg by mouth daily before breakfast., Disp: , Rfl:  .  oxybutynin (DITROPAN-XL) 5 MG 24 hr tablet, Take 5 mg by mouth daily with breakfast., Disp: , Rfl:  .  OXYGEN, Inhale 6 L/min into the lungs continuous., Disp: , Rfl:  .  prochlorperazine (COMPAZINE) 10 MG tablet, Take 10 mg by mouth every 4 (four) hours as needed for nausea or vomiting., Disp: , Rfl:  .  QUEtiapine (SEROQUEL) 300 MG tablet, Take 300 mg  by mouth at bedtime., Disp: , Rfl:  .  QUEtiapine (SEROQUEL) 50 MG tablet, Take 50 mg by mouth daily with breakfast., Disp: , Rfl:  .  senna-docusate (SENOKOT-S) 8.6-50 MG tablet, Take 2 tablets by mouth 2 (two) times daily as needed for mild constipation., Disp: 20 tablet, Rfl: 0 .  theophylline (THEODUR) 300 MG 12 hr tablet, Take 300 mg by mouth 2 (two) times daily., Disp: , Rfl:  .  tiotropium (SPIRIVA HANDIHALER) 18 MCG inhalation capsule, Place 1 capsule (18 mcg total) into inhaler and inhale daily., Disp: 30 capsule, Rfl: 12 .  Vitamin D, Ergocalciferol, (DRISDOL) 50000 units CAPS capsule, Take 50,000 Units by mouth every Sunday. , Disp: , Rfl: :  Allergies  Allergen Reactions  . Adhesive [Tape] Other (See Comments)    Skin peels off   . Ciprofloxacin  Unknown reaction   . Latex     Unknown reaction   . Prednisone Nausea And Vomiting  :  Family History  Problem Relation Age of Onset  . COPD Mother   . Diabetes Unknown   :  Social History   Social History  . Marital status: Single    Spouse name: N/A  . Number of children: N/A  . Years of education: N/A   Occupational History  . Not on file.   Social History Main Topics  . Smoking status: Former Smoker    Packs/day: 1.00    Types: Cigarettes    Quit date: 2016  . Smokeless tobacco: Never Used     Comment: started smoking 2017 to current sometimes 1 daily or once a month  . Alcohol use 0.0 oz/week     Comment: occasional beer  . Drug use: No  . Sexual activity: No   Other Topics Concern  . Not on file   Social History Narrative  . No narrative on file  :  Pertinent items are noted in HPI.  Exam: Blood pressure 138/75, pulse (!) 133, temperature 98.4 F (36.9 C), temperature source Oral, resp. rate 17, height 4\' 11"  (1.499 m), weight 129 lb 14.4 oz (58.9 kg), SpO2 94 %, peak flow (!) 3 L/min.  ECOG 3  General appearance: Chronically ill-appearing woman without distress. Throat: No oral thrush or  ulcers. Neck: no adenopathy Back: negative Resp: Expiratory wheezes noted bilaterally. Chest wall: no tenderness Cardio: regular rate and rhythm, S1, S2 normal, no murmur, click, rub or gallop Extremities: extremities normal, atraumatic, no cyanosis or edema Skin: Skin color, texture, turgor normal. No rashes or lesions Lymph nodes: Cervical, supraclavicular, and axillary nodes normal.  CBC    Component Value Date/Time   WBC 9.6 01/16/2017 0325   RBC 3.64 (L) 01/16/2017 0325   HGB 10.2 (L) 01/16/2017 0325   HCT 33.1 (L) 01/16/2017 0325   PLT 291 01/16/2017 0325   MCV 90.9 01/16/2017 0325   MCH 28.0 01/16/2017 0325   MCHC 30.8 01/16/2017 0325   RDW 17.0 (H) 01/16/2017 0325   LYMPHSABS 1.8 01/11/2017 0833   MONOABS 1.3 (H) 01/11/2017 0833   EOSABS 0.2 01/11/2017 0833   BASOSABS 0.0 01/11/2017 0833     Chemistry      Component Value Date/Time   NA 141 01/16/2017 0325   K 3.7 01/16/2017 0325   CL 98 (L) 01/16/2017 0325   CO2 35 (H) 01/16/2017 0325   BUN 12 01/16/2017 0325   CREATININE 0.82 01/16/2017 0325      Component Value Date/Time   CALCIUM 9.2 01/16/2017 0325   ALKPHOS 77 01/03/2017 0441   AST 10 (L) 01/03/2017 0441   ALT 9 (L) 01/03/2017 0441   BILITOT <0.1 (L) 01/03/2017 0441       Ct Abdomen Pelvis W Contrast  Result Date: 01/11/2017 CLINICAL DATA:  Known left hydronephrosis and pelvic mass EXAM: CT ABDOMEN AND PELVIS WITH CONTRAST TECHNIQUE: Multidetector CT imaging of the abdomen and pelvis was performed using the standard protocol following bolus administration of intravenous contrast. CONTRAST:  100 mL Isovue-300 COMPARISON:  CT from earlier in the same day. FINDINGS: Lower chest: Small right pleural effusion and right lower lobe consolidation are again seen. Some right middle lobe consolidation is noted as well. Patchy infiltrate in the left lower lobe is also noted. Hepatobiliary: No focal liver abnormality is seen. No gallstones, gallbladder wall  thickening, or biliary dilatation. Pancreas: Unremarkable. No pancreatic ductal  dilatation or surrounding inflammatory changes. Spleen: Normal in size without focal abnormality. Adrenals/Urinary Tract: The adrenal glands are within normal limits. The right kidney demonstrates no renal calculi or obstructive changes. The left kidney again demonstrates significant hydronephrosis likely of the long-standing nature given the diffuse degree of cortical thinning. The hydronephrosis extends inferiorly and there is in growth into the mid to distal left ureter causing the obstructive change. This seen growth arises from the known pelvic mass lesion. Bladder is partially distended. An enhancing lesion is noted along the posterior aspect of the bladder near the trigone on the left. This is similar to that seen on the prior exam. Stomach/Bowel: No obstructive or inflammatory changes are noted. Scattered diverticular change is seen. The appendix is not visualize consistent with a prior surgical history. Vascular/Lymphatic: The aorta demonstrates atherosclerotic changes. Reproductive: Uterus is not well visualized and has likely been surgically removed. Other: Enhancing pelvic mass lesion is again identified which measures at least 6.3 by 4.8 cm. It extends for approximately 9.5 cm in craniocaudad projection. It causes occlusion of the mid to distal left ureter and show some extension along the common iliac artery on the left. There is also soft tissue surrounding the common iliac artery on the left related to the mass lesion. It also extends inferiorly towards the posterior wall of the bladder similar to that seen on the prior exam. Musculoskeletal: Postsurgical changes in the proximal right femur are noted. No definitive osseous metastatic lesions are seen. Degenerative change of the lumbar spine is noted. IMPRESSION: Large enhancing pelvic mass lesion on the left. Again this is likely of left ovarian or transitional cell  origin. Occlusion of the left ureter is noted. There is also extension towards the posterior aspect of the bladder wall near the trigone and UVJ. The lesion in develops portions of the iliac vasculature on the left. Compression upon the left iliac venous structures is noted although known deep venous thrombosis is seen. Stable bibasilar changes with associated right-sided effusion. Aortic Atherosclerosis (ICD10-170.0) Electronically Signed   By: Inez Catalina M.D.   On: 01/11/2017 20:51      Assessment and Plan:    66 year old woman with the following issues:  1. 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 The mass is measuring up to 9.5 cm diagnosed in July 2018. Biopsy obtained on 01/16/2017 confirmed this malignancy. No evidence of distant metastasis noted at this time.  The natural course of this disease was discussed today with the patient, her son and her daughter-in-law. Options of therapy was also reviewed today which are very limited. Primary surgical therapy is not an option given the size of the tumor, location and her poor health. Given her multiple comorbid conditions including respiratory difficulties and chronic oxygen dependence I do not see a scenario where aggressive surgery can be accomplished. Because of these findings, no curative options exist at this time. Any treatments at this time would be palliative at best.  She is a poor candidate for chemotherapy given her poor performance status and multiple comorbid conditions. Palliative radiation therapy can be an option to alleviate some of the pain that she is experiencing because of that mass. She understands that radiation can shrink that tumor slightly but certainly will not alter her prognosis.  The goals of therapy with her move forward would be palliative. I have recommended supportive care only and consideration for palliative radiation if it can be done.  Her  prognosis is poor with limited life expectancy. She has been on hospice previously and it would be reasonable to consider hospice involvement in the near future.  She is interested in considering radiation therapy and we will make the appropriate referrals.  2. Follow-up: Will be in 3 months to follow her progress.

## 2017-02-04 ENCOUNTER — Encounter (HOSPITAL_COMMUNITY): Payer: Self-pay

## 2017-02-04 ENCOUNTER — Emergency Department (HOSPITAL_COMMUNITY): Payer: Medicare Other

## 2017-02-04 ENCOUNTER — Emergency Department (HOSPITAL_COMMUNITY)
Admission: EM | Admit: 2017-02-04 | Discharge: 2017-02-04 | Disposition: A | Discharge status: Home / Self Care | Payer: Medicare Other | Attending: Emergency Medicine | Admitting: Emergency Medicine

## 2017-02-04 DIAGNOSIS — R109 Unspecified abdominal pain: Secondary | ICD-10-CM

## 2017-02-04 DIAGNOSIS — R0789 Other chest pain: Secondary | ICD-10-CM | POA: Diagnosis present

## 2017-02-04 DIAGNOSIS — J45909 Unspecified asthma, uncomplicated: Secondary | ICD-10-CM | POA: Diagnosis not present

## 2017-02-04 DIAGNOSIS — Z79899 Other long term (current) drug therapy: Secondary | ICD-10-CM | POA: Insufficient documentation

## 2017-02-04 DIAGNOSIS — R079 Chest pain, unspecified: Secondary | ICD-10-CM

## 2017-02-04 DIAGNOSIS — Z91040 Latex allergy status: Secondary | ICD-10-CM | POA: Insufficient documentation

## 2017-02-04 DIAGNOSIS — J449 Chronic obstructive pulmonary disease, unspecified: Secondary | ICD-10-CM | POA: Diagnosis not present

## 2017-02-04 DIAGNOSIS — R000 Tachycardia, unspecified: Secondary | ICD-10-CM | POA: Insufficient documentation

## 2017-02-04 DIAGNOSIS — Z794 Long term (current) use of insulin: Secondary | ICD-10-CM | POA: Diagnosis not present

## 2017-02-04 DIAGNOSIS — R1012 Left upper quadrant pain: Secondary | ICD-10-CM | POA: Diagnosis not present

## 2017-02-04 DIAGNOSIS — Z87891 Personal history of nicotine dependence: Secondary | ICD-10-CM | POA: Insufficient documentation

## 2017-02-04 DIAGNOSIS — E119 Type 2 diabetes mellitus without complications: Secondary | ICD-10-CM | POA: Diagnosis not present

## 2017-02-04 LAB — BASIC METABOLIC PANEL
Anion gap: 11 (ref 5–15)
BUN: 8 mg/dL (ref 6–20)
CO2: 28 mmol/L (ref 22–32)
Calcium: 9.4 mg/dL (ref 8.9–10.3)
Chloride: 97 mmol/L — ABNORMAL LOW (ref 101–111)
Creatinine, Ser: 0.77 mg/dL (ref 0.44–1.00)
GFR calc Af Amer: >60 mL/min (ref >60)
GLUCOSE: 205 mg/dL — AB (ref 65–99)
POTASSIUM: 4 mmol/L (ref 3.5–5.1)
Sodium: 136 mmol/L (ref 135–145)

## 2017-02-04 LAB — HEPATIC FUNCTION PANEL
ALK PHOS: 117 U/L (ref 38–126)
ALT: 7 U/L — AB (ref 14–54)
AST: 13 U/L — ABNORMAL LOW (ref 15–41)
Albumin: 3.4 g/dL — ABNORMAL LOW (ref 3.5–5.0)
BILIRUBIN TOTAL: 0.4 mg/dL (ref 0.3–1.2)
Total Protein: 6.9 g/dL (ref 6.5–8.1)

## 2017-02-04 LAB — CBC
HEMATOCRIT: 33.9 % — AB (ref 36.0–46.0)
Hemoglobin: 10.7 g/dL — ABNORMAL LOW (ref 12.0–15.0)
MCH: 27.6 pg (ref 26.0–34.0)
MCHC: 31.6 g/dL (ref 30.0–36.0)
MCV: 87.6 fL (ref 78.0–100.0)
Platelets: 431 10*3/uL — ABNORMAL HIGH (ref 150–400)
RBC: 3.87 MIL/uL (ref 3.87–5.11)
RDW: 16.1 % — AB (ref 11.5–15.5)
WBC: 8.6 10*3/uL (ref 4.0–10.5)

## 2017-02-04 LAB — I-STAT TROPONIN, ED
Troponin i, poc: 0 ng/mL (ref 0.00–0.08)
Troponin i, poc: 0.01 ng/mL (ref 0.00–0.08)

## 2017-02-04 LAB — LIPASE, BLOOD: Lipase: 24 U/L (ref 11–51)

## 2017-02-04 MED ORDER — POLYETHYLENE GLYCOL 3350 17 G PO PACK: 17.0000 g | PACK | Freq: Every day | ORAL | 0 refills | Status: AC

## 2017-02-04 MED ORDER — HYDROCODONE-ACETAMINOPHEN 5-325 MG PO TABS
1.0000 | ORAL_TABLET | Freq: Once | ORAL | Status: AC
  Administered 2017-02-04: 1 via ORAL
  Filled 2017-02-04: qty 1

## 2017-02-04 MED ORDER — FAMOTIDINE IN NACL 20-0.9 MG/50ML-% IV SOLN
20.0000 mg | Freq: Once | INTRAVENOUS | Status: AC
  Administered 2017-02-04: 20 mg via INTRAVENOUS
  Filled 2017-02-04: qty 50

## 2017-02-04 MED ORDER — SODIUM CHLORIDE 0.9 % IV BOLUS (SEPSIS)
500.0000 mL | Freq: Once | INTRAVENOUS | Status: AC
  Administered 2017-02-04: 500 mL via INTRAVENOUS

## 2017-02-04 MED ORDER — DOXYCYCLINE HYCLATE 100 MG PO TABS
100.0000 mg | ORAL_TABLET | Freq: Once | ORAL | Status: AC
  Administered 2017-02-04: 100 mg via ORAL
  Filled 2017-02-04: qty 1

## 2017-02-04 MED ORDER — IOPAMIDOL (ISOVUE-300) INJECTION 61%
INTRAVENOUS | Status: AC
  Administered 2017-02-04: 75 mL
  Filled 2017-02-04: qty 100

## 2017-02-04 MED ORDER — DOXYCYCLINE HYCLATE 100 MG PO CAPS: 100.0000 mg | ORAL_CAPSULE | Freq: Two times a day (BID) | ORAL | 0 refills | Status: DC

## 2017-02-04 MED ORDER — MORPHINE SULFATE (PF) 4 MG/ML IV SOLN
4.0000 mg | Freq: Once | INTRAVENOUS | Status: AC
  Administered 2017-02-04: 4 mg via INTRAVENOUS
  Filled 2017-02-04: qty 1

## 2017-02-04 NOTE — ED Notes (Signed)
Patient transported to X-ray 

## 2017-02-04 NOTE — ED Provider Notes (Signed)
[facility] DEPT Provider Note   CSN: [id number] Arrival date & time: 02/04/17  0947     History   Chief Complaint Chief Complaint  Patient presents with  . Chest Pain    HPI Traci Thomas is a 66 y.o. female.  HPI  66 year old female with a history of severe COPD on home oxygen, diabetes, and chronic back pain presents with chest pain that started around 2 AM. It is left-sided and feels like a gnawing pain. He goes to her back and then down her leg. She does endorse some upper abdominal pain as well. No shortness of breath more than her typical, no nausea or vomiting. She is constipated which is an acute on chronic issue. The pain is currently severe, 9/10. She was given aspirin, nitroglycerin, and Xanax with minimal relief.  Past Medical History:  Diagnosis Date  . Asthma   . Back pain   . COPD (chronic obstructive pulmonary disease) (HCC)    Home O2 3lpm, theophylline  . Diabetes mellitus without complication [facility])     Patient Active Problem List   Diagnosis Date Noted  . Pelvic mass   . Palliative care by specialist   . HCAP (healthcare-associated pneumonia)   . Gross hematuria 01/11/2017  . Hydronephrosis of left kidney 01/11/2017  . Acute respiratory failure with hypoxia and hypercapnia ([place]) 01/03/2017  . Acute respiratory acidosis 01/03/2017  . Normocytic anemia 01/03/2017  . Chronic respiratory failure with hypoxia ([place]) 11/30/2016  . Constipation 11/02/2016  . Lobar pneumonia ([place]) 10/04/2016  . Acute metabolic encephalopathy 09/62/8366  . Abdominal pain   . Sepsis ([place])   . Hip fracture ([place]) 10/21/2015  . Stage 4 very severe COPD by GOLD classification ([place]) 10/21/2015  . Anemia 10/21/2015  . Dementia 10/21/2015  . Goals of care, counseling/discussion 10/21/2015    Class: Acute  . Closed right hip fracture ([place])   . Encounter for palliative care   . COPD exacerbation ([place]) 03/25/2015  . Diabetes mellitus type 2 in nonobese ([place]) 03/25/2015  .  Depression 03/25/2015    Past Surgical History:  Procedure Laterality Date  . APPENDECTOMY    . FEMUR IM NAIL Right 10/21/2015   Procedure: INTRAMEDULLARY (IM) NAIL FEMORAL;  Surgeon: Meredith Pel, MD;  Location: [facility] ORS;  Service: Orthopedics;  Laterality: Right;  . TONSILLECTOMY      OB History    No data available       Home Medications    Prior to Admission medications   Medication Sig Start Date End Date Taking? Authorizing Provider  acetaminophen (TYLENOL) 325 MG tablet Take 650 mg by mouth every 4 (four) hours as needed for fever.   Yes [provider]  albuterol (PROVENTIL) (2.5 MG/3ML) 0.083% nebulizer solution Take 3 mLs (2.5 mg total) by nebulization every 4 (four) hours as needed for wheezing or shortness of breath. 10/17/16  Yes Arrien, Jimmy Picket, MD  ALPRAZolam Duanne Moron) 0.5 MG tablet Take 1 tablet (0.5 mg total) by mouth 3 (three) times daily as needed for anxiety. 01/17/17  Yes Patrecia Pour, MD  desvenlafaxine (PRISTIQ) 100 MG 24 hr tablet Take 100 mg by mouth daily with breakfast.    Yes [provider]  docusate sodium (COLACE) 100 MG capsule Take 100 mg by mouth 2 (two) times daily.    Yes [provider]  fluticasone (FLONASE) 50 MCG/ACT nasal spray Place 2 sprays into both nostrils daily.   Yes [provider]  fluticasone furoate-vilanterol (BREO ELLIPTA) 200-25  MCG/INH AEPB Inhale 1 puff into the lungs daily after breakfast.    Yes [provider]  furosemide (LASIX) 20 MG tablet Take 20 mg by mouth daily.   Yes [provider]  gabapentin (NEURONTIN) 100 MG capsule Take 200 mg by mouth 4 (four) times daily.    Yes [provider]  HYDROcodone-acetaminophen (NORCO/VICODIN) 5-325 MG tablet Take 1 tablet by mouth every 6 (six) hours as needed for moderate pain.   Yes [provider]  insulin aspart (NOVOLOG) 100 UNIT/ML injection Inject 0-15 Units into the skin 3 (three) times daily with  meals. Glucose 150 to 200 use 2 units, for 201 to 250 use 4 units, for 251 to 300 use 6 units, for 301 to 350 use 8 units, for 351 or greater use 10 units. Patient taking differently: Inject 0-15 Units into the skin See admin instructions. Inject 3 times daily with meals  Glucose 150 to 200 use 2 units, for 201 to 250 use 4 units, for 251 to 300 use 6 units, for 301 to 350 use 8 units, for 351 or greater use 10 units. 10/17/16  Yes Arrien, Jimmy Picket, MD  ipratropium-albuterol (DUONEB) 0.5-2.5 (3) MG/3ML SOLN Take 3 mLs by nebulization every 6 (six) hours as needed (COPD).   Yes [provider]  levocetirizine (XYZAL) 5 MG tablet Take 5 mg by mouth every evening.   Yes [provider]  magnesium hydroxide (MILK OF MAGNESIA) 400 MG/5ML suspension Take 30 mLs by mouth as needed for mild constipation.   Yes [provider]  Menthol, Topical Analgesic, (ICY HOT EX) Apply 1 patch topically daily. Leave on for 8 hours   Yes [provider]  omeprazole (PRILOSEC) 20 MG capsule Take 20 mg by mouth daily before breakfast.   Yes [provider]  oxybutynin (DITROPAN-XL) 5 MG 24 hr tablet Take 5 mg by mouth daily with breakfast.   Yes [provider]  OXYGEN Inhale 6 L/min into the lungs continuous.   Yes [provider]  prochlorperazine (COMPAZINE) 10 MG tablet Take 10 mg by mouth every 4 (four) hours as needed for nausea or vomiting.   Yes [provider]  QUEtiapine (SEROQUEL) 300 MG tablet Take 300 mg by mouth at bedtime.   Yes [provider]  QUEtiapine (SEROQUEL) 50 MG tablet Take 50 mg by mouth daily with breakfast.   Yes [provider]  senna-docusate (SENOKOT-S) 8.6-50 MG tablet Take 2 tablets by mouth 2 (two) times daily as needed for mild constipation. Patient taking differently: Take 2 tablets by mouth at bedtime.  01/17/17  Yes Patrecia Pour, MD  theophylline (THEODUR) 300 MG 12 hr tablet Take 300 mg by  mouth 2 (two) times daily.   Yes [provider]  tiotropium (SPIRIVA HANDIHALER) 18 MCG inhalation capsule Place 1 capsule (18 mcg total) into inhaler and inhale daily. 03/31/15  Yes Short, Noah Delaine, MD  UNABLE TO FIND Take 120 mLs by mouth 2 (two) times daily. MED PASS   Yes [provider]  Vitamin D, Ergocalciferol, (DRISDOL) 50000 units CAPS capsule Take 50,000 Units by mouth every Sunday.    Yes [provider]  albuterol (PROVENTIL HFA;VENTOLIN HFA) 108 (90 BASE) MCG/ACT inhaler Inhale 2 puffs into the lungs every 6 (six) hours as needed for wheezing or shortness of breath.    [provider]  doxycycline (VIBRAMYCIN) 100 MG capsule Take 1 capsule (100 mg total) by mouth 2 (two) times daily. One po  bid x 7 days 02/04/17   Sherwood Gambler, MD  guaiFENesin (ROBITUSSIN) 100 MG/5ML liquid Take 200 mg by mouth every 4 (four) hours as needed for cough.    [provider]  hydroxypropyl methylcellulose / hypromellose (ISOPTO TEARS / GONIOVISC) 2.5 % ophthalmic solution Place 1 drop into both eyes every 12 (twelve) hours as needed for dry eyes.    [provider]  polyethylene glycol (MIRALAX / GLYCOLAX) packet Take 17 g by mouth daily. 02/04/17   Sherwood Gambler, MD    Family History Family History  Problem Relation Age of Onset  . COPD Mother   . Diabetes Unknown     Social History Social History  Substance Use Topics  . Smoking status: Former Smoker    Packs/day: 1.00    Types: Cigarettes    Quit date: 2016  . Smokeless tobacco: Never Used     Comment: started smoking 2017 to current sometimes 1 daily or once a month  . Alcohol use 0.0 oz/week     Comment: occasional beer     Allergies   Adhesive [tape]; Ciprofloxacin; Latex; and Prednisone   Review of Systems Review of Systems  Constitutional: Negative for fever.  Respiratory: Negative for shortness of breath.   Cardiovascular: Positive for chest pain.  Gastrointestinal:  Positive for abdominal pain and constipation. Negative for nausea and vomiting.  Musculoskeletal: Positive for back pain.  All other systems reviewed and are negative.    Physical Exam Updated Vital Signs BP (!) 155/91   Pulse (!) 103   Temp 98.7 F (37.1 C) (Oral)   Resp 17   Ht 4\' 11"  (1.499 m)   Wt 54.4 kg (120 lb)   SpO2 98%   BMI 24.24 kg/m   Physical Exam  Constitutional: She is oriented to person, place, and time. She appears well-developed and well-nourished.  HENT:  Head: Normocephalic and atraumatic.  Right Ear: External ear normal.  Left Ear: External ear normal.  Nose: Nose normal.  Eyes: Right eye exhibits no discharge. Left eye exhibits no discharge.  Cardiovascular: Regular rhythm and normal heart sounds.  Tachycardia present.   HR low 100s  Pulmonary/Chest: Effort normal. No respiratory distress. She has no wheezes. She has no rales. She exhibits tenderness.  Abdominal: Soft. There is tenderness.  Diffuse abdominal tenderness, worst in the LUQ  Musculoskeletal: She exhibits no edema.  Neurological: She is alert and oriented to person, place, and time.  Skin: Skin is warm and dry.  Nursing note and vitals reviewed.    ED Treatments / Results  Labs (all labs ordered are listed, but only abnormal results are displayed) Labs Reviewed  BASIC METABOLIC PANEL - Abnormal; Notable for the following:       Result Value   Chloride 97 (*)    Glucose, Bld 205 (*)    All other components within normal limits  CBC - Abnormal; Notable for the following:    Hemoglobin 10.7 (*)    HCT 33.9 (*)    RDW 16.1 (*)    Platelets 431 (*)    All other components within normal limits  HEPATIC FUNCTION PANEL - Abnormal; Notable for the following:    Albumin 3.4 (*)    AST 13 (*)    ALT 7 (*)    Bilirubin, Direct <0.1 (*)    All other components within normal limits  LIPASE, BLOOD  I-STAT TROPONIN, ED  I-STAT TROPONIN, ED    EKG  EKG  Interpretation  Date/Time:  Saturday February 04 2017 06:53:52 EDT Ventricular Rate:  112 PR Interval:    QRS Duration: 96 QT Interval:  331 QTC Calculation: 452 R Axis:   85 Text Interpretation:  Sinus tachycardia Borderline right axis deviation Borderline low voltage, extremity leads no significant change compared to July 2018 Confirmed by Sherwood Gambler [phone]) on 02/04/2017 7:09:19 AM Also confirmed by Sherwood Gambler [phone]), editor Drema Pry [phone])  on 02/04/2017 8:54:34 AM       EKG Interpretation  Date/Time:  Saturday February 04 2017 09:53:24 EDT Ventricular Rate:  103 PR Interval:    QRS Duration: 100 QT Interval:  363 QTC Calculation: 476 R Axis:   85 Text Interpretation:  Sinus tachycardia Borderline right axis deviation no significant change since earlier in the day Confirmed by Sherwood Gambler [phone]) on 02/04/2017 10:09:47 AM        Radiology Dg Chest 2 View  Result Date: 02/04/2017 CLINICAL DATA:  Chest pain EXAM: CHEST  2 VIEW COMPARISON:  01/12/2017 FINDINGS: Emphysema with streaky opacities at the bases. The lungs are hyperinflated. Normal heart size and mediastinal contours. EKG leads create artifact over the chest. IMPRESSION: COPD with atelectatic type opacities at the bases. Electronically Signed   By: Monte Fantasia M.D.   On: 02/04/2017 07:28   Ct Abdomen Pelvis W Contrast  Result Date: 02/04/2017 CLINICAL DATA:  Acute chest and abdominal pain since earlier this morning. 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 EXAM: CT ABDOMEN AND PELVIS WITH CONTRAST TECHNIQUE: Multidetector CT imaging of the abdomen and pelvis was performed using the standard protocol following bolus administration of intravenous contrast. CONTRAST:  86mL ISOVUE-300 IOPAMIDOL (ISOVUE-300) INJECTION 61% COMPARISON:  01/16/2017, 01/11/2017 FINDINGS: Lower chest: mild basilar hyperinflation / emphysema. Right lower lobe dependent consolidative opacity  concerning for pneumonia or aspiration. No large pericardial or pleural effusion. Left lung bases clear. Normal heart size. Atherosclerosis of the aorta. Hepatobiliary: No focal liver abnormality is seen. No gallstones, gallbladder wall thickening, or biliary dilatation. Pancreas: Unremarkable. No pancreatic ductal dilatation or surrounding inflammatory changes. Spleen: Normal in size without focal abnormality. Adrenals/Urinary Tract: Normal adrenal glands. Right kidney and ureter demonstrate no acute process or obstruction. Chronic left renal atrophy and hydronephrosis as before. Left hydroureter extends to the pelvis. There is re- demonstration of a left pelvic side wall irregular enhancing mass, slightly larger now measuring 7.5 x 5.2 cm, previously 7.1 x 5.0 cm. This obstructs the left ureter and encases the left iliac vasculature. Enhancing nodular tumor extends along the right UVJ and base of bladder. Stomach/Bowel: Negative for bowel obstruction, significant dilatation, ileus, or free air. Appendix not visualized. Left descending colon and sigmoid diverticulosis evident. No acute inflammatory process. No fluid collection or abscess. Vascular/Lymphatic: Aortoiliac atherosclerosis. Negative for aneurysm. No retroperitoneal hemorrhage or hematoma. No acute vascular process. Reproductive: Remote hysterectomy. Other: Intact abdominal wall.  No hernia. Musculoskeletal: Degenerative changes of the spine. Previous right hip ORIF. No acute osseous finding. IMPRESSION: Enlarging left pelvic side wall mass obstructing the left ureter and encasing the left iliac vasculature. Enhancing nodular tumor extends along the right UVJ and base the bladder. Findings compatible with urothelial malignancy. Dependent right lower lobe consolidative airspace process concerning for pneumonia or aspiration. Basilar emphysema also noted. Aortic atherosclerosis Electronically Signed   By: Jerilynn Mages.  Shick M.D.   On: 02/04/2017 09:53     Procedures Procedures (including critical care time)  Medications Ordered in ED Medications  morphine 4 MG/ML injection 4 mg (4 mg Intravenous Given  02/04/17 0803)  famotidine (PEPCID) IVPB 20 mg premix (0 mg Intravenous Stopped 02/04/17 0846)  sodium chloride 0.9 % bolus 500 mL (0 mLs Intravenous Stopped 02/04/17 1009)  iopamidol (ISOVUE-300) 61 % injection (75 mLs  Contrast Given 02/04/17 0913)  HYDROcodone-acetaminophen (NORCO/VICODIN) 5-325 MG per tablet 1 tablet (1 tablet Oral Given 02/04/17 1116)  doxycycline (VIBRA-TABS) tablet 100 mg (100 mg Oral Given 02/04/17 1116)     Initial Impression / Assessment and Plan / ED Course  I have reviewed the triage vital signs and the nursing notes.  Pertinent labs & imaging results that were available during my care of the patient were reviewed by me and considered in my medical decision making (see chart for details).     Patient's chest pain is quite atypical and I think more related to LUQ pain given abd tenderness. CT as above with progressive of cancerous disease. She has first time of upcomming radiation this month. Pain now controlled. She is on norco at her facility. I doubt this is ACS, PE, dissection. There is possible PNA on CT, which she says she coughed more last night and was a little dyspneic, though resolved. Now. Will cover for antibiotics given her significant COPD and recent admission. However her O2 requirement is stable, no dyspnea now. Stable for d/c home with return precautions.  Final Clinical Impressions(s) / ED Diagnoses   Final diagnoses:  Nonspecific chest pain  Left sided abdominal pain    New Prescriptions Discharge Medication List as of 02/04/2017 10:37 AM    START taking these medications   Details  doxycycline (VIBRAMYCIN) 100 MG capsule Take 1 capsule (100 mg total) by mouth 2 (two) times daily. One po bid x 7 days, Starting Sat 02/04/2017, Print         Sherwood Gambler, MD 02/04/17 [phone]

## 2017-02-04 NOTE — ED Notes (Signed)
[facility] contacted to transport patient back to [facility] 

## 2017-02-04 NOTE — ED Notes (Signed)
ED Provider at bedside. 

## 2017-02-04 NOTE — ED Notes (Signed)
[facility] called.  Son at bedside, notified of discharge orders. Called [facility] and LM to please call for discharge instructions.

## 2017-02-04 NOTE — ED Triage Notes (Signed)
BIB [facility] from [facility] c/o chest pain that started at 2am. Pt states it is 10/10 crushing pain on the left side of the chest w/ Sob, no N/V/D diaphoresis. Pt per ems was tachy, with HTN 185/120. Pt given 324 asa and xanax and pain meds for anxiety and chronic back pain prior to ems arrival. Pt bp dropped with 1 nitro to 137/104. Pt still c/o cp 9/10. Pt has hx of anxiety , back pain and afib per ems.

## 2017-02-07 NOTE — Progress Notes (Addendum)
Location of Tumor / Histology: Left kidney mass  Traci Thomas presented with symptoms of: hematuria   Biopsies revealed:   01/16/17 Diagnosis Pelvis, biopsy, left mass - POORLY DIFFERENTIATED CARCINOMA - SEE COMMENT Microscopic Comment The biopsy is composed of a poorly differentiated neoplas arranged in irregular nests. The neoplastic cells are large with pleomorphic nuclei and ample clear to eosinophilic cytoplasm. By immunohistochemistry, the neoplasm is positive for Gata-3, p53, p63, p16, cytokeratins 7, 20, and 903 but negative for WT-1 and Pax-8. Overall, the immunophenotype supports a primary urothelial carcinoma.  Past/Anticipated interventions by surgery, if any: none  Past/Anticipated interventions by medical oncology, if any: Per Dr. Alen Blew, "She is a poor candidate for chemotherapy given her poor performance status and multiple comorbid conditions."  Weight changes, if any: has lost 4 lbs.  Reports having a poor appetite.  Bowel/Bladder complaints, if any: has urinary frequency.  Has seen hematuria a "couple of times."  She reports having constipation.  Her last bm was today.  Nausea/Vomiting, if any: yes  Pain issues, if any:  Has severe pain in her lower left abdomen and left lower back. She reports having the pain for 6 months to a year.  She is taking hydrocodone/acetaminophen 5/325 mg q 6 hours.  SAFETY ISSUES:  Prior radiation? no  Pacemaker/ICD? no  Possible current pregnancy? no  Is the patient on methotrexate? no  Current Complaints / other details:  Dr. Alen Blew is recommending palliative radiation therapy.  Patient is here with son.  She [facility] lives at [facility].  Patient is trying to get hospice.  She has had it in the past for COPD and dementia which turned out to be a UTI.   BP (!) 149/88 (BP Location: Left Arm, Patient Position: Sitting)   Pulse (!) 103   Temp 98.3 F (36.8 C) (Oral)   Ht 4' 11.5" (1.511 m)   Wt 124 lb (56.2 kg)   SpO2 94%  Comment: 3L  BMI 24.63 kg/m    Wt Readings from Last 3 Encounters:  02/08/17 124 lb (56.2 kg)  02/04/17 120 lb (54.4 kg)  01/31/17 129 lb 14.4 oz (58.9 kg)

## 2017-02-08 ENCOUNTER — Ambulatory Visit
Admission: RE | Admit: 2017-02-08 | Discharge: 2017-02-08 | Disposition: A | Discharge status: Home / Self Care | Payer: Medicare Other | Source: Ambulatory Visit | Attending: Radiation Oncology | Admitting: Radiation Oncology

## 2017-02-08 ENCOUNTER — Encounter: Payer: Self-pay | Admitting: Radiation Oncology

## 2017-02-08 VITALS — BP 149/88 | HR 103 | Temp 98.3°F | Ht 59.5 in | Wt 124.0 lb

## 2017-02-08 DIAGNOSIS — Z808 Family history of malignant neoplasm of other organs or systems: Secondary | ICD-10-CM | POA: Diagnosis not present

## 2017-02-08 DIAGNOSIS — Z79899 Other long term (current) drug therapy: Secondary | ICD-10-CM | POA: Diagnosis not present

## 2017-02-08 DIAGNOSIS — Z9981 Dependence on supplemental oxygen: Secondary | ICD-10-CM | POA: Diagnosis not present

## 2017-02-08 DIAGNOSIS — E119 Type 2 diabetes mellitus without complications: Secondary | ICD-10-CM | POA: Insufficient documentation

## 2017-02-08 DIAGNOSIS — Z87891 Personal history of nicotine dependence: Secondary | ICD-10-CM | POA: Insufficient documentation

## 2017-02-08 DIAGNOSIS — Z510 Encounter for antineoplastic radiation therapy: Secondary | ICD-10-CM | POA: Diagnosis not present

## 2017-02-08 DIAGNOSIS — J449 Chronic obstructive pulmonary disease, unspecified: Secondary | ICD-10-CM | POA: Insufficient documentation

## 2017-02-08 DIAGNOSIS — Z8043 Family history of malignant neoplasm of testis: Secondary | ICD-10-CM | POA: Insufficient documentation

## 2017-02-08 DIAGNOSIS — J9601 Acute respiratory failure with hypoxia: Secondary | ICD-10-CM

## 2017-02-08 DIAGNOSIS — C662 Malignant neoplasm of left ureter: Secondary | ICD-10-CM | POA: Insufficient documentation

## 2017-02-08 DIAGNOSIS — J9602 Acute respiratory failure with hypercapnia: Secondary | ICD-10-CM

## 2017-02-08 DIAGNOSIS — Z794 Long term (current) use of insulin: Secondary | ICD-10-CM | POA: Diagnosis not present

## 2017-02-08 DIAGNOSIS — R1900 Intra-abdominal and pelvic swelling, mass and lump, unspecified site: Secondary | ICD-10-CM

## 2017-02-08 NOTE — Progress Notes (Signed)
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 From 01/03/17 to 01/17/17 she was hospitalized with symptoms of shortness of breath, hypoxia, and respiratory failure. During her admission she developed hematuria and underwent imaging including a CT A/P on 01/11/17 which showed a large enhancing pelvic mass arising from the left GU tract or left ovary. Hydronephrosis was noted and the mass was measuring 9.5 cm in the craniocaudal projection. It causes occlusion of the mid to distal left ureter. No lymphadenopathy noted and chest x-ray did not show any evidence of measurable disease. She had biopsy on 01/16/17 which showed a poorly differentiated carcinoma suggestive of GU tract. She was subsequently referred from Dr Hazeline Junker office for palliative radiation therapy consult. She was not thought a good candidate chemotherapy or surgery.   Additionally, she was seen in the ED on 02/04/17 with complaints of chest pain and upper abdominal pain. At that time she had a CXR performed which was consistent with her COPD with atelectatic type opacities at the bases. Another CT A/P was performed at that time which was read as "Enlarging left pelvic side wall mass obstructing the left ureter and encasing the left iliac vasculature. Enhancing nodular tumor extends along the right UVJ and base the bladder. Findings compatible with urothelial malignancy." Her chest pain at that time was thought to likely be due to this, and not related to ACS, PE, or  dissection. Her CT was also concerning for possible PNA vs aspiration and she was covered with antibiotics at that time.   Pt presents today c/o pain to her left pelvic region extending into her left flank. She reports some distension over the area as well and her pain over the area is worse with palpation. Pt is currently prescribed Hydrocodone for her pain, but she states this has been unable to control this at home. She does note that her hematuria is still ongoing and she has noticed some clots within her voids as well. Additionally, she reports that she has been increasingly constipated recently. She has been given Milk of Magnesia at her SNF which has improved this somewhat. She is also currently on MiraLAX BID for this as well. She also notes that she currently has a headache; however, she reports that she has a h/o migraine headaches and her pain today consistent with this.   PREVIOUS RADIATION THERAPY: No  PAST MEDICAL HISTORY:  has a past medical history of Asthma; Back pain; COPD (chronic obstructive pulmonary disease) ([place]); and Diabetes mellitus without complication ([place]).    PAST SURGICAL HISTORY: Past Surgical History:  Procedure Laterality Date  . APPENDECTOMY    . bladder repair  1997   [facility] - took muscle from her left leg to replace her bladder muscle  . FEMUR IM NAIL Right 10/21/2015   Procedure: INTRAMEDULLARY (IM) NAIL FEMORAL;  Surgeon: Meredith Pel, MD;  Location: [facility];  Service: Orthopedics;  Laterality: Right;  . TONSILLECTOMY      FAMILY HISTORY: family history includes COPD in her  mother; Colon cancer in her sister; Diabetes in her unknown relative; Skin cancer in her mother; Testicular cancer in her brother.  SOCIAL HISTORY:  reports that she quit smoking about 2 years ago. Her smoking use included Cigarettes. She has a 50.00 pack-year smoking history. She has never used smokeless tobacco. She reports that she does not drink alcohol or use  drugs.  ALLERGIES: Adhesive [tape]; Aspirin; Ciprofloxacin; Latex; and Prednisone  MEDICATIONS:  Current Outpatient Prescriptions  Medication Sig Dispense Refill  . acetaminophen (TYLENOL) 325 MG tablet Take 650 mg by mouth every 4 (four) hours as needed for fever.    Marland Kitchen albuterol (PROVENTIL HFA;VENTOLIN HFA) 108 (90 BASE) MCG/ACT inhaler Inhale 2 puffs into the lungs every 6 (six) hours as needed for wheezing or shortness of breath.    Marland Kitchen albuterol (PROVENTIL) (2.5 MG/3ML) 0.083% nebulizer solution Take 3 mLs (2.5 mg total) by nebulization every 4 (four) hours as needed for wheezing or shortness of breath. 75 mL 12  . ALPRAZolam (XANAX) 0.5 MG tablet Take 1 tablet (0.5 mg total) by mouth 3 (three) times daily as needed for anxiety. 10 tablet 0  . desvenlafaxine (PRISTIQ) 100 MG 24 hr tablet Take 100 mg by mouth daily with breakfast.     . docusate sodium (COLACE) 100 MG capsule Take 100 mg by mouth 2 (two) times daily.     Marland Kitchen doxycycline (VIBRAMYCIN) 100 MG capsule Take 1 capsule (100 mg total) by mouth 2 (two) times daily. One po bid x 7 days 14 capsule 0  . fluticasone furoate-vilanterol (BREO ELLIPTA) 200-25 MCG/INH AEPB Inhale 1 puff into the lungs daily after breakfast.     . furosemide (LASIX) 20 MG tablet Take 20 mg by mouth daily.    Marland Kitchen gabapentin (NEURONTIN) 100 MG capsule Take 200 mg by mouth 4 (four) times daily.     Marland Kitchen guaiFENesin (ROBITUSSIN) 100 MG/5ML liquid Take 200 mg by mouth every 4 (four) hours as needed for cough.    Marland Kitchen HYDROcodone-acetaminophen (NORCO/VICODIN) 5-325 MG tablet Take 1 tablet by mouth every 6 (six) hours as needed for moderate pain.    . hydroxypropyl methylcellulose / hypromellose (ISOPTO TEARS / GONIOVISC) 2.5 % ophthalmic solution Place 1 drop into both eyes every 12 (twelve) hours as needed for dry eyes.    . insulin aspart (NOVOLOG) 100 UNIT/ML injection Inject 0-15 Units into the skin 3 (three) times daily with meals. Glucose 150 to 200 use 2 units, for 201 to  250 use 4 units, for 251 to 300 use 6 units, for 301 to 350 use 8 units, for 351 or greater use 10 units. (Patient taking differently: Inject 0-15 Units into the skin See admin instructions. Inject 3 times daily with meals  Glucose 150 to 200 use 2 units, for 201 to 250 use 4 units, for 251 to 300 use 6 units, for 301 to 350 use 8 units, for 351 or greater use 10 units.) 10 mL 11  . ipratropium-albuterol (DUONEB) 0.5-2.5 (3) MG/3ML SOLN Take 3 mLs by nebulization every 6 (six) hours as needed (COPD).    Marland Kitchen levocetirizine (XYZAL) 5 MG tablet Take 5 mg by mouth every evening.    . magnesium hydroxide (MILK OF MAGNESIA) 400 MG/5ML suspension Take 30 mLs by mouth as needed for mild constipation.    . Menthol, Topical Analgesic, (ICY HOT EX) Apply 1 patch topically daily. Leave on for 8 hours    . omeprazole (PRILOSEC) 20 MG capsule Take 20 mg by mouth daily  before breakfast.    . oxybutynin (DITROPAN-XL) 5 MG 24 hr tablet Take 5 mg by mouth daily with breakfast.    . OXYGEN Inhale 6 L/min into the lungs continuous.    . polyethylene glycol (MIRALAX / GLYCOLAX) packet Take 17 g by mouth daily. 14 each 0  . prochlorperazine (COMPAZINE) 10 MG tablet Take 10 mg by mouth every 4 (four) hours as needed for nausea or vomiting.    Marland Kitchen QUEtiapine (SEROQUEL) 300 MG tablet Take 300 mg by mouth at bedtime.    Marland Kitchen QUEtiapine (SEROQUEL) 50 MG tablet Take 50 mg by mouth daily with breakfast.    . senna-docusate (SENOKOT-S) 8.6-50 MG tablet Take 2 tablets by mouth 2 (two) times daily as needed for mild constipation. (Patient taking differently: Take 2 tablets by mouth at bedtime. ) 20 tablet 0  . theophylline (THEODUR) 300 MG 12 hr tablet Take 300 mg by mouth 2 (two) times daily.    Marland Kitchen tiotropium (SPIRIVA HANDIHALER) 18 MCG inhalation capsule Place 1 capsule (18 mcg total) into inhaler and inhale daily. 30 capsule 12  . Vitamin D, Ergocalciferol, (DRISDOL) 50000 units CAPS capsule Take 50,000 Units by mouth every Sunday.      . fluticasone (FLONASE) 50 MCG/ACT nasal spray Place 2 sprays into both nostrils daily.    Marland Kitchen UNABLE TO FIND Take 120 mLs by mouth 2 (two) times daily. MED PASS     No current facility-administered medications for this encounter.     REVIEW OF SYSTEMS:  REVIEW OF SYSTEMS: A 10+ POINT REVIEW OF SYSTEMS WAS OBTAINED including neurology, dermatology, psychiatry, cardiac, respiratory, lymph, extremities, GI, GU, musculoskeletal, constitutional, reproductive, HEENT. All pertinent positives are noted in the HPI. All others are negative.   PHYSICAL EXAM:  height is 4' 11.5" (1.511 m) and weight is 124 lb (56.2 kg). Her oral temperature is 98.3 F (36.8 C). Her blood pressure is 149/88 (abnormal) and her pulse is 103 (abnormal). Her oxygen saturation is 94%.   General: Alert and oriented, in no acute distress HEENT: Head is normocephalic. Extraocular movements are intact. Oropharynx is clear. Neck: Neck is supple, no palpable cervical or supraclavicular lymphadenopathy. Heart: Regular in rate and rhythm with no murmurs, rubs, or gallops. Chest: Clear to auscultation bilaterally, with no rhonchi, wheezes, or rales. Abdomen: Soft, nondistended, with no rigidity or guarding. She does have palpable tenderness over the LLQ and left flank region.  Extremities: No cyanosis or edema. Lymphatics: see Neck Exam Skin: No concerning lesions. Musculoskeletal: symmetric strength and muscle tone throughout. Neurologic: Cranial nerves II through XII are grossly intact. No obvious focalities. Speech is fluent. Coordination is intact. Psychiatric: Judgment and insight are intact. Affect is appropriate.  ECOG = 2  LABORATORY DATA:  Lab Results  Component Value Date   WBC 8.6 02/04/2017   HGB 10.7 (L) 02/04/2017   HCT 33.9 (L) 02/04/2017   MCV 87.6 02/04/2017   PLT 431 (H) 02/04/2017   NEUTROABS 12.3 (H) 01/11/2017   Lab Results  Component Value Date   NA 136 02/04/2017   K 4.0 02/04/2017   CL 97 (L)  02/04/2017   CO2 28 02/04/2017   GLUCOSE 205 (H) 02/04/2017   CREATININE 0.77 02/04/2017   CALCIUM 9.4 02/04/2017      RADIOGRAPHY: Dg Chest 2 View  Result Date: 02/04/2017 CLINICAL DATA:  Chest pain EXAM: CHEST  2 VIEW COMPARISON:  01/12/2017 FINDINGS: Emphysema with streaky opacities at the bases. The lungs are hyperinflated. Normal heart size and mediastinal  contours. EKG leads create artifact over the chest. IMPRESSION: COPD with atelectatic type opacities at the bases. Electronically Signed   By: Monte Fantasia M.D.   On: 02/04/2017 07:28   Ct Abdomen Pelvis W Contrast  Result Date: 02/04/2017 CLINICAL DATA:  Acute chest and abdominal pain since earlier this morning. 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 EXAM: CT ABDOMEN AND PELVIS WITH CONTRAST TECHNIQUE: Multidetector CT imaging of the abdomen and pelvis was performed using the standard protocol following bolus administration of intravenous contrast. CONTRAST:  84mL ISOVUE-300 IOPAMIDOL (ISOVUE-300) INJECTION 61% COMPARISON:  01/16/2017, 01/11/2017 FINDINGS: Lower chest: mild basilar hyperinflation / emphysema. Right lower lobe dependent consolidative opacity concerning for pneumonia or aspiration. No large pericardial or pleural effusion. Left lung bases clear. Normal heart size. Atherosclerosis of the aorta. Hepatobiliary: No focal liver abnormality is seen. No gallstones, gallbladder wall thickening, or biliary dilatation. Pancreas: Unremarkable. No pancreatic ductal dilatation or surrounding inflammatory changes. Spleen: Normal in size without focal abnormality. Adrenals/Urinary Tract: Normal adrenal glands. Right kidney and ureter demonstrate no acute process or obstruction. Chronic left renal atrophy and hydronephrosis as before. Left hydroureter extends to the pelvis. There is re- demonstration of a left pelvic side wall irregular enhancing mass, slightly larger now measuring 7.5 x 5.2 cm, previously 7.1 x  5.0 cm. This obstructs the left ureter and encases the left iliac vasculature. Enhancing nodular tumor extends along the right UVJ and base of bladder. Stomach/Bowel: Negative for bowel obstruction, significant dilatation, ileus, or free air. Appendix not visualized. Left descending colon and sigmoid diverticulosis evident. No acute inflammatory process. No fluid collection or abscess. Vascular/Lymphatic: Aortoiliac atherosclerosis. Negative for aneurysm. No retroperitoneal hemorrhage or hematoma. No acute vascular process. Reproductive: Remote hysterectomy. Other: Intact abdominal wall.  No hernia. Musculoskeletal: Degenerative changes of the spine. Previous right hip ORIF. No acute osseous finding. IMPRESSION: Enlarging left pelvic side wall mass obstructing the left ureter and encasing the left iliac vasculature. Enhancing nodular tumor extends along the right UVJ and base the bladder. Findings compatible with urothelial malignancy. Dependent right lower lobe consolidative airspace process concerning for pneumonia or aspiration. Basilar emphysema also noted. Aortic atherosclerosis Electronically Signed   By: Jerilynn Mages.  Shick M.D.   On: 02/04/2017 09:53   Ct Abdomen Pelvis W Contrast  Result Date: 01/11/2017 CLINICAL DATA:  Known left hydronephrosis and pelvic mass EXAM: CT ABDOMEN AND PELVIS WITH CONTRAST TECHNIQUE: Multidetector CT imaging of the abdomen and pelvis was performed using the standard protocol following bolus administration of intravenous contrast. CONTRAST:  100 mL Isovue-300 COMPARISON:  CT from earlier in the same day. FINDINGS: Lower chest: Small right pleural effusion and right lower lobe consolidation are again seen. Some right middle lobe consolidation is noted as well. Patchy infiltrate in the left lower lobe is also noted. Hepatobiliary: No focal liver abnormality is seen. No gallstones, gallbladder wall thickening, or biliary dilatation. Pancreas: Unremarkable. No pancreatic ductal dilatation  or surrounding inflammatory changes. Spleen: Normal in size without focal abnormality. Adrenals/Urinary Tract: The adrenal glands are within normal limits. The right kidney demonstrates no renal calculi or obstructive changes. The left kidney again demonstrates significant hydronephrosis likely of the long-standing nature given the diffuse degree of cortical thinning. The hydronephrosis extends inferiorly and there is in growth into the mid to distal left ureter causing the obstructive change. This seen growth arises from the known pelvic mass lesion. Bladder is partially distended. An enhancing lesion is noted along the posterior aspect of the bladder near the  trigone on the left. This is similar to that seen on the prior exam. Stomach/Bowel: No obstructive or inflammatory changes are noted. Scattered diverticular change is seen. The appendix is not visualize consistent with a prior surgical history. Vascular/Lymphatic: The aorta demonstrates atherosclerotic changes. Reproductive: Uterus is not well visualized and has likely been surgically removed. Other: Enhancing pelvic mass lesion is again identified which measures at least 6.3 by 4.8 cm. It extends for approximately 9.5 cm in craniocaudad projection. It causes occlusion of the mid to distal left ureter and show some extension along the common iliac artery on the left. There is also soft tissue surrounding the common iliac artery on the left related to the mass lesion. It also extends inferiorly towards the posterior wall of the bladder similar to that seen on the prior exam. Musculoskeletal: Postsurgical changes in the proximal right femur are noted. No definitive osseous metastatic lesions are seen. Degenerative change of the lumbar spine is noted. IMPRESSION: Large enhancing pelvic mass lesion on the left. Again this is likely of left ovarian or transitional cell origin. Occlusion of the left ureter is noted. There is also extension towards the posterior  aspect of the bladder wall near the trigone and UVJ. The lesion in develops portions of the iliac vasculature on the left. Compression upon the left iliac venous structures is noted although known deep venous thrombosis is seen. Stable bibasilar changes with associated right-sided effusion. Aortic Atherosclerosis (ICD10-170.0) Electronically Signed   By: Inez Catalina M.D.   On: 01/11/2017 20:51   US Renal  Result Date: 01/10/2017 CLINICAL DATA:  66 year old diabetic female with hematuria. Initial encounter. EXAM: RENAL / URINARY TRACT ULTRASOUND COMPLETE COMPARISON:  None. FINDINGS: Right Kidney: Length: 11.5 cm. Echogenicity within normal limits. No mass or hydronephrosis visualized. Left Kidney: Length: 12.2 cm. Marked left-sided hydronephrosis. Etiology not demonstrated on the present exam. Renal parenchymal thinning. Evaluation of renal parenchyma is limited by patient's habitus and bowel gas. Bladder: Only right ureteral jet was visualized.  No obvious mass. IMPRESSION: Marked left-sided hydronephrosis. Etiology not demonstrated on the present exam. Renal parenchymal thinning. Evaluation of left renal parenchyma is limited by patient's habitus and bowel gas. Only right ureteral jet was visualized. Electronically Signed   By: Genia Del M.D.   On: 01/10/2017 17:46   Ct Biopsy  Result Date: 01/16/2017 INDICATION: 66 year old female with a history of new diagnosis left pelvis mass EXAM: CT BIOPSY MEDICATIONS: None. ANESTHESIA/SEDATION: Moderate (conscious) sedation was employed during this procedure. A total of Versed 0.5 mg and Fentanyl 25 mcg was administered intravenously. Moderate Sedation Time: 13 minutes. The patient's level of consciousness and vital signs were monitored continuously by radiology nursing throughout the procedure under my direct supervision. FLUOROSCOPY TIME:  CT COMPLICATIONS: None PROCEDURE: Informed written consent was obtained from the patient after a thorough discussion of  the procedural risks, benefits and alternatives. All questions were addressed. Maximal Sterile Barrier Technique was utilized including caps, mask, sterile gowns, sterile gloves, sterile drape, hand hygiene and skin antiseptic. A timeout was performed prior to the initiation of the procedure. Patient positioned supine position on CT gantry table. Scout CT images of the pelvis were performed with images stored sent to PACs. Patient was then prepped and draped in the usual sterile fashion. The skin and subcutaneous tissues were generously infiltrated 1% lidocaine for local anesthesia. Using CT guidance, 17 gauge guide needle was advanced into left pelvic mass. Multiple core biopsy were acquired. Needle was removed and a sterile bandage was  placed. Patient tolerated the procedure well and remained hemodynamically stable throughout. No complications were encountered and no significant blood loss. IMPRESSION: Status post CT-guided biopsy of left pelvis mass. Tissue specimen sent to pathology for complete histopathologic analysis. Signed, Dulcy Fanny. Earleen Newport, DO [facility] Electronically Signed   By: Corrie Mckusick D.O.   On: 01/16/2017 12:23   Dg Chest Port 1 View  Result Date: 01/12/2017 CLINICAL DATA:  Respiratory failure. EXAM: PORTABLE CHEST 1 VIEW COMPARISON:  01/10/2017 . FINDINGS: Right PICC line noted with tip projected over the superior vena cava. Heart size normal. Bibasilar atelectasis/ infiltrates unchanged. Small right pleural effusion. No pneumothorax. IMPRESSION: 1. Right PICC line stable position with tip projected superior vena cava. 2. Bibasilar atelectasis/ infiltrates unchanged. Small right pleural effusion . Electronically Signed   By: Marcello Moores  Register   On: 01/12/2017 07:03   Dg Chest Port 1 View  Result Date: 01/10/2017 CLINICAL DATA:  Acute respiratory failure with hypoxia EXAM: PORTABLE CHEST 1 VIEW COMPARISON:  01/08/2017 FINDINGS:  Endotracheal tube and NG tube removed. Right arm PICC tip in the SVC unchanged. Bibasilar airspace disease unchanged. Negative for heart failure or effusion IMPRESSION: Endotracheal tube and NG tube removed No change in bibasilar atelectasis/ infiltrate. Electronically Signed   By: Franchot Gallo M.D.   On: 01/10/2017 07:14   Ct Renal Stone Study  Result Date: 01/11/2017 CLINICAL DATA:  Gross hematuria. Left-sided hydronephrosis of unclear etiology and chronicity. History of COPD, diabetes and chronic respiratory failure. EXAM: CT ABDOMEN AND PELVIS WITHOUT CONTRAST TECHNIQUE: Multidetector CT imaging of the abdomen and pelvis was performed following the standard protocol without IV contrast. COMPARISON:  Renal ultrasound 01/10/2017.  Chest CT 10/24/2015. FINDINGS: Lower chest: Small right pleural effusion. There are right infrahilar airspace opacities within the right middle and lower lobes with associated volume loss and central airway narrowing. There is lesser patchy airspace disease in the left lower lobe. The lung bases are incompletely visualized. Coronary artery calcifications are noted. Hepatobiliary: The liver appears unremarkable as imaged in the noncontrast state. No evidence of gallstones, gallbladder wall thickening or biliary dilatation. Pancreas: Unremarkable. No pancreatic ductal dilatation or surrounding inflammatory changes. Spleen: Normal in size without focal abnormality. Adrenals/Urinary Tract: Both adrenal glands appear normal. There is no evidence of urinary tract calculus. The right kidney appears unremarkable. The left kidney demonstrates marked cortical thinning and moderate hydronephrosis and hydroureter. The left ureter is dilated into the pelvis where there is a 7.1 x 4.9 cm mass (image 55), likely obstructing the ureter. The urinary bladder is distended. Contiguous with the left pelvic mass, there is dependent nodularity in the left bladder base near the trigone, measuring up to 20  mm on image 65. This could reflect tumor or blood clot. Stomach/Bowel: No evidence of bowel wall thickening, distention or surrounding inflammatory change. There are mild distal colonic diverticular changes. Vascular/Lymphatic: There are no discretely enlarged abdominal or pelvic lymph nodes. The left pelvic mass abuts the left femoral vasculature. Aortic and branch vessel atherosclerosis noted. Reproductive: Hysterectomy. Probable residual ovarian tissue on the right. The left ovary is not seen separate from the left adnexal mass described above. Other: No ascites or peritoneal nodularity. Musculoskeletal: No acute or significant osseous findings. Stable lower thoracic compression deformities. Previous proximal right femoral ORIF. IMPRESSION: 1. 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 The degree of hydronephrosis and cortical thinning imply chronicity. Major differential considerations are left ovarian malignancy and transitional  cell carcinoma of the distal left ureter. Lymphoma consider less likely. 2. Nodularity within the left bladder lumen could reflect distal extension of tumor or blood clot related to the patient's hematuria. Cystoscopy recommended. 3. No evidence of distant metastases or urinary tract calculus. 4. Bibasilar airspace opacities suspicious for chronic aspiration. Small right pleural effusion. Chest radiographic follow up recommended. 5.  Aortic Atherosclerosis (ICD10-I70.0). Electronically Signed   By: Richardean Sale M.D.   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 Her options of therapy are very limited. Primary surgical therapy is not an option given the size of the tumor, location and her poor health. Given her multiple comorbid conditions including respiratory difficulties and chronic oxygen dependence there is not  see a scenario where aggressive surgery can be accomplished. Because of these findings, no curative options exist at this time. Any treatments at this time would be palliative at best.  She is a poor candidate for chemotherapy given her poor performance status and multiple comorbid conditions. Palliative radiation therapy can be an option to alleviate some of the pain that she is experiencing because of that mass. She understands that radiation can shrink that tumor slightly but certainly will not alter her prognosis.  We discussed the natural history of her condition and general treatment, highlighting the role of radiotherapy in the management.  We discussed the available radiation techniques, and focused on the details of logistics and delivery.  We reviewed the anticipated acute and late sequelae associated with radiation in this setting.  The patient was encouraged to ask questions that I answered to the best of my ability. The patient would like to proceed with radiation and will be scheduled for CT simulation.  PLAN: CT Simulation tomorrow at 8am. Treatments to begin next week. Anticipate b/t 10-14 treatments directed at the large pelvic mass.        -----------------------------------  Blair Promise, PhD, MD  This document serves as a record of services personally performed by Gery Pray, MD. It was created on his behalf by Reola Mosher, a trained medical scribe. The creation of this record is based on the scribe's personal observations and the provider's statements to them. This document has been checked and approved by the attending provider.

## 2017-02-08 NOTE — Progress Notes (Signed)
Please see the Nurse Progress Note in the MD Initial Consult Encounter for this patient. 

## 2017-02-08 NOTE — Addendum Note (Signed)
Encounter addended by: Jacqulyn Liner, RN on: 02/08/2017 11:44 AM<BR>    Actions taken: Sign clinical note, Vitals modified

## 2017-02-08 NOTE — Progress Notes (Signed)
  Radiation Oncology         [phone] ________________________________  Name: Traci Thomas MRN: [id number]  Date: 02/09/2017  DOB: 03/20/1951  SIMULATION AND TREATMENT PLANNING NOTE    ICD-10-CM   1. 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  NARRATIVE:  The patient was brought to the [facility].  Identity was confirmed.  All relevant records and images related to the planned course of therapy were reviewed.  The patient freely provided informed written consent to proceed with treatment after reviewing the details related to the planned course of therapy. The consent form was witnessed and verified by the simulation staff.  Then, the patient was set-up in a stable reproducible  supine position for radiation therapy.  CT images were obtained.  Surface markings were placed.  The CT images were loaded into the planning software.  Then the target and avoidance structures were contoured.  Treatment planning then occurred.  The radiation prescription was entered and confirmed.  Then, I designed and supervised the construction of a total of 5 medically necessary complex treatment devices.  I have requested : 3D Simulation  I have requested a DVH of the following structures: Small bowel, GTV, PTV, bladder, rectum femhead/neck.  I have ordered:dose calc.  PLAN:  The patient will receive 30 Gy in 10 fractions.  -----------------------------------  Blair Promise, PhD, MD  This document serves as a record of services personally performed by Gery Pray, MD. It was created on his behalf by Arlyce Harman, a trained medical scribe. The creation of this record is based on the scribe's personal observations and the provider's statements to them. This document has been checked  and approved by the attending provider.

## 2017-02-09 ENCOUNTER — Ambulatory Visit
Admission: RE | Admit: 2017-02-09 | Discharge: 2017-02-09 | Disposition: A | Discharge status: Home / Self Care | Payer: Medicare Other | Source: Ambulatory Visit | Attending: Radiation Oncology | Admitting: Radiation Oncology

## 2017-02-09 ENCOUNTER — Telehealth: Payer: Self-pay | Admitting: Oncology

## 2017-02-09 VITALS — BP 150/105 | HR 112 | Temp 98.2°F

## 2017-02-09 DIAGNOSIS — Z808 Family history of malignant neoplasm of other organs or systems: Secondary | ICD-10-CM | POA: Diagnosis not present

## 2017-02-09 DIAGNOSIS — Z9981 Dependence on supplemental oxygen: Secondary | ICD-10-CM | POA: Diagnosis not present

## 2017-02-09 DIAGNOSIS — E119 Type 2 diabetes mellitus without complications: Secondary | ICD-10-CM | POA: Diagnosis not present

## 2017-02-09 DIAGNOSIS — Z794 Long term (current) use of insulin: Secondary | ICD-10-CM | POA: Diagnosis not present

## 2017-02-09 DIAGNOSIS — C662 Malignant neoplasm of left ureter: Secondary | ICD-10-CM

## 2017-02-09 DIAGNOSIS — Z510 Encounter for antineoplastic radiation therapy: Secondary | ICD-10-CM | POA: Diagnosis not present

## 2017-02-09 DIAGNOSIS — Z8043 Family history of malignant neoplasm of testis: Secondary | ICD-10-CM | POA: Diagnosis not present

## 2017-02-09 DIAGNOSIS — Z87891 Personal history of nicotine dependence: Secondary | ICD-10-CM | POA: Diagnosis not present

## 2017-02-09 DIAGNOSIS — J449 Chronic obstructive pulmonary disease, unspecified: Secondary | ICD-10-CM | POA: Diagnosis not present

## 2017-02-09 DIAGNOSIS — Z79899 Other long term (current) drug therapy: Secondary | ICD-10-CM | POA: Diagnosis not present

## 2017-02-09 LAB — CBC WITH DIFFERENTIAL/PLATELET
BASO%: 0.1 % (ref 0.0–2.0)
BASOS ABS: 0 10*3/uL (ref 0.0–0.1)
EOS ABS: 0.2 10*3/uL (ref 0.0–0.5)
EOS%: 2.4 % (ref 0.0–7.0)
HEMATOCRIT: 37.7 % (ref 34.8–46.6)
HGB: 11.6 g/dL (ref 11.6–15.9)
LYMPH%: 12.5 % — AB (ref 14.0–49.7)
MCH: 27.6 pg (ref 25.1–34.0)
MCHC: 30.8 g/dL — AB (ref 31.5–36.0)
MCV: 89.8 fL (ref 79.5–101.0)
MONO#: 0.6 10*3/uL (ref 0.1–0.9)
MONO%: 8.2 % (ref 0.0–14.0)
NEUT%: 76.8 % (ref 38.4–76.8)
NEUTROS ABS: 5.5 10*3/uL (ref 1.5–6.5)
NRBC: 0 % (ref 0–0)
Platelets: 426 10*3/uL — ABNORMAL HIGH (ref 145–400)
RBC: 4.2 10*6/uL (ref 3.70–5.45)
RDW: 15.7 % — AB (ref 11.2–14.5)
WBC: 7.2 10*3/uL (ref 3.9–10.3)
lymph#: 0.9 10*3/uL (ref 0.9–3.3)

## 2017-02-09 LAB — BASIC METABOLIC PANEL
Anion Gap: 10 mEq/L (ref 3–11)
BUN: 10.3 mg/dL (ref 7.0–26.0)
CHLORIDE: 97 meq/L — AB (ref 98–109)
CO2: 31 mEq/L — ABNORMAL HIGH (ref 22–29)
CREATININE: 1 mg/dL (ref 0.6–1.1)
Calcium: 10.2 mg/dL (ref 8.4–10.4)
EGFR: 59 mL/min/{1.73_m2} — ABNORMAL LOW (ref >90)
GLUCOSE: 156 mg/dL — AB (ref 70–140)
POTASSIUM: 3.8 meq/L (ref 3.5–5.1)
Sodium: 139 mEq/L (ref 136–145)

## 2017-02-09 MED ORDER — MORPHINE SULFATE 4 MG/ML IJ SOLN
4.0000 mg | Freq: Once | INTRAMUSCULAR | Status: AC
  Administered 2017-02-09: 4 mg via INTRAMUSCULAR
  Filled 2017-02-09: qty 1

## 2017-02-09 MED ORDER — HYDROCODONE-ACETAMINOPHEN 10-325 MG PO TABS: 1.0000 | ORAL_TABLET | Freq: Four times a day (QID) | ORAL | 0 refills | Status: DC | PRN

## 2017-02-09 NOTE — Progress Notes (Signed)
Called Tonya at [facility] again to let her know that patient had voiced concerns about not urinating since 4:30 this morning and also not having a bowel movement today.  Advised her that Dr. Sondra Come had examined the patient and did not palpate any bladder distension.  Kenney Houseman said she will check on the patient.

## 2017-02-09 NOTE — Progress Notes (Signed)
Called [facility] and verified that patient has not had any pain medication since 2:30 am.  Patient given 4 mg morphine IM in right deltoid per Dr. Sondra Come.

## 2017-02-09 NOTE — Progress Notes (Signed)
Patient reports her pain is a little better.  Will continue to monitor.

## 2017-02-09 NOTE — Addendum Note (Signed)
Encounter addended by: Jacqulyn Liner, RN on: 02/09/2017 11:12 AM<BR>    Actions taken: Sign clinical note

## 2017-02-09 NOTE — Telephone Encounter (Signed)
Received call from Dallie Piles, FNP asking for Dr. Clabe Seal office notes.  She said she is patient's family nurse practitioner and needs to know the plan of care. Called patient son and verified that Dallie Piles is patient's FNP.

## 2017-02-09 NOTE — Progress Notes (Signed)
Traci Thomas, Nurse Supervisor at [facility] and advised her that patient received 4 mg of morphine while she was here for her CT [facility] appointment.

## 2017-02-13 DIAGNOSIS — Z510 Encounter for antineoplastic radiation therapy: Secondary | ICD-10-CM | POA: Diagnosis not present

## 2017-02-15 ENCOUNTER — Ambulatory Visit: Payer: Medicare Other | Admitting: Radiation Oncology

## 2017-02-15 ENCOUNTER — Ambulatory Visit: Payer: Medicare Other

## 2017-02-16 ENCOUNTER — Ambulatory Visit
Admission: RE | Admit: 2017-02-16 | Discharge: 2017-02-16 | Disposition: A | Discharge status: Home / Self Care | Payer: Medicare Other | Source: Ambulatory Visit | Attending: Radiation Oncology | Admitting: Radiation Oncology

## 2017-02-16 DIAGNOSIS — Z510 Encounter for antineoplastic radiation therapy: Secondary | ICD-10-CM | POA: Diagnosis not present

## 2017-02-16 DIAGNOSIS — C662 Malignant neoplasm of left ureter: Secondary | ICD-10-CM

## 2017-02-16 NOTE — Progress Notes (Signed)
  Radiation Oncology         [phone] ________________________________  Name: Traci Thomas MRN: [id number]  Date: 02/16/2017  DOB: [birth year]  Simulation Verification Note    ICD-10-CM   1. Urothelial carcinoma of left distal ureter ([place]) C66.2     Status: outpatient  NARRATIVE: The patient was brought to the treatment unit and placed in the planned treatment position. The clinical setup was verified. Then port films were obtained and uploaded to the radiation oncology medical record software.  The treatment beams were carefully compared against the planned radiation fields. The position location and shape of the radiation fields was reviewed. They targeted volume of tissue appears to be appropriately covered by the radiation beams. Organs at risk appear to be excluded as planned.  Based on my personal review, I approved the simulation verification. The patient's treatment will proceed as planned.  -----------------------------------  Blair Promise, PhD, MD

## 2017-02-17 ENCOUNTER — Ambulatory Visit
Admission: RE | Admit: 2017-02-17 | Discharge: 2017-02-17 | Disposition: A | Discharge status: Home / Self Care | Payer: Medicare Other | Source: Ambulatory Visit | Attending: Radiation Oncology | Admitting: Radiation Oncology

## 2017-02-17 DIAGNOSIS — Z510 Encounter for antineoplastic radiation therapy: Secondary | ICD-10-CM | POA: Diagnosis not present

## 2017-02-21 ENCOUNTER — Ambulatory Visit
Admission: RE | Admit: 2017-02-21 | Discharge: 2017-02-21 | Disposition: A | Discharge status: Home / Self Care | Payer: Medicare Other | Source: Ambulatory Visit | Attending: Radiation Oncology | Admitting: Radiation Oncology

## 2017-02-21 DIAGNOSIS — C662 Malignant neoplasm of left ureter: Secondary | ICD-10-CM

## 2017-02-21 DIAGNOSIS — Z510 Encounter for antineoplastic radiation therapy: Secondary | ICD-10-CM | POA: Diagnosis not present

## 2017-02-21 NOTE — Progress Notes (Signed)
Pt here for patient teaching.  Pt given Radiation and You booklet.  Reviewed areas of pertinence such as diarrhea, fatigue, nausea and vomiting, skin changes and urinary and bladder changes . Pt able to give teach back of to pat skin, use unscented/gentle soap, have Imodium on hand and drink plenty of water,apply Radiaplex bid and avoid applying anything to skin within 4 hours of treatment. Pt demonstrated understanding and verbalizes understanding of information given and will contact nursing with any questions or concerns.

## 2017-02-22 ENCOUNTER — Telehealth: Payer: Self-pay | Admitting: Oncology

## 2017-02-22 ENCOUNTER — Ambulatory Visit
Admission: RE | Admit: 2017-02-22 | Discharge: 2017-02-22 | Disposition: A | Discharge status: Home / Self Care | Payer: Medicare Other | Source: Ambulatory Visit | Attending: Radiation Oncology | Admitting: Radiation Oncology

## 2017-02-22 ENCOUNTER — Ambulatory Visit (HOSPITAL_COMMUNITY)
Admission: RE | Admit: 2017-02-22 | Discharge: 2017-02-22 | Disposition: A | Discharge status: Home / Self Care | Source: Ambulatory Visit | Attending: Radiation Oncology | Admitting: Radiation Oncology

## 2017-02-22 DIAGNOSIS — M7989 Other specified soft tissue disorders: Secondary | ICD-10-CM | POA: Insufficient documentation

## 2017-02-22 DIAGNOSIS — C662 Malignant neoplasm of left ureter: Secondary | ICD-10-CM | POA: Insufficient documentation

## 2017-02-22 DIAGNOSIS — R590 Localized enlarged lymph nodes: Secondary | ICD-10-CM | POA: Insufficient documentation

## 2017-02-22 DIAGNOSIS — Z510 Encounter for antineoplastic radiation therapy: Secondary | ICD-10-CM | POA: Diagnosis not present

## 2017-02-22 NOTE — Telephone Encounter (Signed)
[place] and spoke to [place], patient's nurse.  Advised her that patient will have a venous doppler today at 3 pm.  Also inquired about patient's code status.  She said Traci Thomas is DNR and will travel with an orange sheet documenting her DNR status.

## 2017-02-22 NOTE — Progress Notes (Signed)
*  PRELIMINARY RESULTS* Vascular Ultrasound Left lower extremity venous duplex has been completed.  Preliminary findings: No evidence of deep vein thrombosis in the visualized veins of the left lower extremity. Negative for baker's cyst on the left. Prominent lymph nodes noted in the left groin.  Somewhat limited evaluation of calf veins due to swelling.  Preliminary report called to Dr. Sondra Come @ 15:20   [place] 02/22/2017, 3:19 PM

## 2017-02-22 NOTE — Telephone Encounter (Signed)
[place] and left a message for transportation regarding patient's appointment for venous doppler today.

## 2017-02-23 ENCOUNTER — Ambulatory Visit
Admission: RE | Admit: 2017-02-23 | Discharge: 2017-02-23 | Disposition: A | Discharge status: Home / Self Care | Payer: Medicare Other | Source: Ambulatory Visit | Attending: Radiation Oncology | Admitting: Radiation Oncology

## 2017-02-23 DIAGNOSIS — Z510 Encounter for antineoplastic radiation therapy: Secondary | ICD-10-CM | POA: Diagnosis not present

## 2017-02-23 NOTE — Progress Notes (Signed)
Notified patient of the results from her venous doppler yesterday.  She verbalized understanding.

## 2017-02-24 ENCOUNTER — Ambulatory Visit
Admission: RE | Admit: 2017-02-24 | Discharge: 2017-02-24 | Disposition: A | Discharge status: Home / Self Care | Payer: Medicare Other | Source: Ambulatory Visit | Attending: Radiation Oncology | Admitting: Radiation Oncology

## 2017-02-24 DIAGNOSIS — Z510 Encounter for antineoplastic radiation therapy: Secondary | ICD-10-CM | POA: Diagnosis not present

## 2017-02-27 ENCOUNTER — Ambulatory Visit
Admission: RE | Admit: 2017-02-27 | Discharge: 2017-02-27 | Disposition: A | Discharge status: Home / Self Care | Payer: Medicare Other | Source: Ambulatory Visit | Attending: Radiation Oncology | Admitting: Radiation Oncology

## 2017-02-27 DIAGNOSIS — Z510 Encounter for antineoplastic radiation therapy: Secondary | ICD-10-CM | POA: Diagnosis not present

## 2017-02-28 ENCOUNTER — Ambulatory Visit
Admission: RE | Admit: 2017-02-28 | Discharge: 2017-02-28 | Disposition: A | Discharge status: Home / Self Care | Payer: Medicare Other | Source: Ambulatory Visit | Attending: Radiation Oncology | Admitting: Radiation Oncology

## 2017-02-28 ENCOUNTER — Other Ambulatory Visit: Payer: Self-pay | Admitting: Radiation Oncology

## 2017-02-28 DIAGNOSIS — C662 Malignant neoplasm of left ureter: Secondary | ICD-10-CM

## 2017-02-28 DIAGNOSIS — Z510 Encounter for antineoplastic radiation therapy: Secondary | ICD-10-CM | POA: Diagnosis not present

## 2017-02-28 LAB — URINALYSIS, MICROSCOPIC - CHCC
BILIRUBIN (URINE): NEGATIVE
Blood: NEGATIVE
Glucose: NEGATIVE mg/dL
KETONES: NEGATIVE mg/dL
Leukocyte Esterase: NEGATIVE
Nitrite: NEGATIVE
Protein: NEGATIVE mg/dL
RBC / HPF: NEGATIVE (ref 0–2)
SPECIFIC GRAVITY, URINE: 1.015 (ref 1.003–1.035)
Urobilinogen, UR: 0.2 mg/dL (ref 0.2–1)
pH: 6.5 (ref 4.6–8.0)

## 2017-03-01 ENCOUNTER — Ambulatory Visit: Payer: Medicare Other

## 2017-03-02 ENCOUNTER — Ambulatory Visit
Admission: RE | Admit: 2017-03-02 | Discharge: 2017-03-02 | Disposition: A | Discharge status: Home / Self Care | Payer: Medicare Other | Source: Ambulatory Visit | Attending: Radiation Oncology | Admitting: Radiation Oncology

## 2017-03-02 ENCOUNTER — Telehealth: Payer: Self-pay | Admitting: Oncology

## 2017-03-02 DIAGNOSIS — Z510 Encounter for antineoplastic radiation therapy: Secondary | ICD-10-CM | POA: Diagnosis not present

## 2017-03-02 LAB — URINE CULTURE

## 2017-03-02 NOTE — Telephone Encounter (Signed)
Left a message for patient regarding her urine culture results.  Requested a return call.

## 2017-03-03 ENCOUNTER — Ambulatory Visit
Admission: RE | Admit: 2017-03-03 | Discharge: 2017-03-03 | Disposition: A | Discharge status: Home / Self Care | Payer: Medicare Other | Source: Ambulatory Visit | Attending: Radiation Oncology | Admitting: Radiation Oncology

## 2017-03-03 DIAGNOSIS — Z510 Encounter for antineoplastic radiation therapy: Secondary | ICD-10-CM | POA: Diagnosis not present

## 2017-03-06 ENCOUNTER — Ambulatory Visit
Admission: RE | Admit: 2017-03-06 | Discharge: 2017-03-06 | Disposition: A | Discharge status: Home / Self Care | Payer: Medicare Other | Source: Ambulatory Visit | Attending: Radiation Oncology | Admitting: Radiation Oncology

## 2017-03-06 ENCOUNTER — Ambulatory Visit: Payer: Medicare Other | Admitting: Internal Medicine

## 2017-03-06 DIAGNOSIS — Z510 Encounter for antineoplastic radiation therapy: Secondary | ICD-10-CM | POA: Diagnosis not present

## 2017-03-07 ENCOUNTER — Ambulatory Visit
Admission: RE | Admit: 2017-03-07 | Discharge: 2017-03-07 | Disposition: A | Discharge status: Home / Self Care | Payer: Medicare Other | Source: Ambulatory Visit | Attending: Radiation Oncology | Admitting: Radiation Oncology

## 2017-03-07 DIAGNOSIS — Z510 Encounter for antineoplastic radiation therapy: Secondary | ICD-10-CM | POA: Diagnosis not present

## 2017-03-08 ENCOUNTER — Ambulatory Visit
Admission: RE | Admit: 2017-03-08 | Discharge: 2017-03-08 | Disposition: A | Discharge status: Home / Self Care | Payer: Medicare Other | Source: Ambulatory Visit | Attending: Radiation Oncology | Admitting: Radiation Oncology

## 2017-03-08 ENCOUNTER — Ambulatory Visit: Payer: Medicare Other

## 2017-03-08 DIAGNOSIS — Z510 Encounter for antineoplastic radiation therapy: Secondary | ICD-10-CM | POA: Diagnosis not present

## 2017-03-09 ENCOUNTER — Ambulatory Visit
Admission: RE | Admit: 2017-03-09 | Discharge: 2017-03-09 | Disposition: A | Discharge status: Home / Self Care | Payer: Medicare Other | Source: Ambulatory Visit | Attending: Radiation Oncology | Admitting: Radiation Oncology

## 2017-03-09 DIAGNOSIS — Z510 Encounter for antineoplastic radiation therapy: Secondary | ICD-10-CM | POA: Diagnosis not present

## 2017-03-13 ENCOUNTER — Encounter: Payer: Self-pay | Admitting: Radiation Oncology

## 2017-03-13 NOTE — Progress Notes (Signed)
  Radiation Oncology         [phone] ________________________________  Name: Traci Thomas MRN: [id number]  Date: 03/13/2017  DOB: 05/16/1951  End of Treatment Note  Diagnosis:   locally advanced primary urothelialcarcinoma presenting in the left pelvis.     Indication for treatment:  Palliative, pain control        Radiation treatment dates:   02/16/2017-03/09/2017  Site/dose:   Pelvis Left, 2.5 Gy in 14 fractions  Beams/energy:   3D, 10X  Narrative: The patient tolerated radiation treatment relatively well.  At the end of the treatment, pt reported lower back pain, left inner leg pain, left foot swelling chronic, intermittent constipation, intermittent diarrhea, fatigue, and vomiting x 1 episode. She denies nausea.    Plan: The patient has completed radiation treatment. The patient will return to radiation oncology clinic for routine followup in one month. I advised them to call or return sooner if they have any questions or concerns related to their recovery or treatment.  -----------------------------------  Blair Promise, PhD, MD  This document serves as a record of services personally performed by Gery Pray, MD. It was created on her behalf by Steva Colder, a trained medical scribe. The creation of this record is based on the scribe's personal observations and the provider's statements to them. This document has been checked and approved by the attending provider.

## 2017-03-24 ENCOUNTER — Encounter: Payer: Self-pay | Admitting: *Deleted

## 2017-03-24 NOTE — Progress Notes (Signed)
[place] Psychosocial Distress Screening Clinical Social Work  Clinical Social Work was referred by distress screening protocol.  The patient scored a 10 on the Psychosocial Distress Thermometer which indicates severe distress. Clinical Social Worker attempted to contact patient by phone to assess for distress and other psychosocial needs. CSW left voicemail to return call.  ONCBCN DISTRESS SCREENING   Screening Type Initial Screening  Distress experienced in past week (1-10) 10  Emotional problem type Depression;Nervousness/Anxiety;Adjusting to illness;Isolation/feeling alone;Feeling hopeless  Information Concerns Type Lack of info about diagnosis;Lack of info about treatment  Physical Problem type Pain;Breathing;Constipation/diarrhea;Changes in urination  Physician notified of physical symptoms Yes    Lauren Alver Sorrow, MSW, LCSW, OSW-C Clinical Social Worker [facility] [phone]

## 2017-04-07 ENCOUNTER — Encounter: Payer: Self-pay | Admitting: Oncology

## 2017-04-10 ENCOUNTER — Ambulatory Visit
Admission: RE | Admit: 2017-04-10 | Discharge: 2017-04-10 | Disposition: A | Discharge status: Home / Self Care | Source: Ambulatory Visit | Attending: Radiation Oncology | Admitting: Radiation Oncology

## 2017-04-10 ENCOUNTER — Encounter: Payer: Self-pay | Admitting: Radiation Oncology

## 2017-04-10 VITALS — BP 117/73 | HR 102 | Temp 98.4°F | Ht 59.5 in | Wt 124.2 lb

## 2017-04-10 DIAGNOSIS — Z79899 Other long term (current) drug therapy: Secondary | ICD-10-CM | POA: Diagnosis not present

## 2017-04-10 DIAGNOSIS — Z923 Personal history of irradiation: Secondary | ICD-10-CM | POA: Insufficient documentation

## 2017-04-10 DIAGNOSIS — C662 Malignant neoplasm of left ureter: Secondary | ICD-10-CM | POA: Insufficient documentation

## 2017-04-10 MED ORDER — HYDROCODONE-ACETAMINOPHEN 10-325 MG PO TABS: 1.0000 | ORAL_TABLET | Freq: Four times a day (QID) | ORAL | 0 refills | Status: DC | PRN

## 2017-04-10 NOTE — Progress Notes (Signed)
Traci Thomas is here for follow up after treatment to her left pelvis.  She reports having pain at a 10/10 in her left pelvis radiation down her leg.  She is taking hydrocodone/acetaminphen prn as well as methadone.  She has noticed a change in her stools which she described as having a covering over it with holes.  She reports having constipation and her last bm was yesterday.  She reports having urinary frequency with dysuria.  She denies having hematuria.  She reports feeling fatigued.   BP 117/73 (BP Location: Right Arm, Patient Position: Sitting)   Pulse (!) 102   Temp 98.4 F (36.9 C) (Oral)   Ht 4' 11.5" (1.511 m)   Wt 124 lb 3.2 oz (56.3 kg)   SpO2 94%   BMI 24.67 kg/m    Wt Readings from Last 3 Encounters:  04/10/17 124 lb 3.2 oz (56.3 kg)  02/08/17 124 lb (56.2 kg)  02/04/17 120 lb (54.4 kg)

## 2017-04-10 NOTE — Progress Notes (Signed)
Radiation Oncology         [phone] ________________________________  Name: Traci Thomas MRN: [id number]  Date: 04/10/2017  DOB: 07/05/1950    Follow-Up Visit Note  CC: System, Pcp Not In  Wyatt Portela, MD    ICD-10-CM   1. 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   Interval Since Last Radiation:  1 months  02/16/2017-03/09/2017: Left Pelvis to 35 Gy in 14 fractions.  Narrative:  The patient returns today for routine follow-up.  She reports having pain at a 10/10 in her left pelvis radiating down her leg. She is taking hydrocodone/acetaminophen prn as well as methadone. She states her pain goes away sometimes and sometimes it hurts so bad "I can't stand it". She reports it hurts just as badly as before she received radiation treatment. She has noticed a change in her stools which she described as having a covering over it with holes. She reports having constipation and her last bowel movement was yesterday. She reports having urinary frequency with dysuria. She denies having hematuria. She reports feeling fatigued. She is scheduled for follow up with Dr. Alen Blew on 05/04/2017.                               ALLERGIES:  is allergic to adhesive [tape]; aspirin; ciprofloxacin; latex; and prednisone.  Meds: Current Outpatient Prescriptions  Medication Sig Dispense Refill  . acetaminophen (TYLENOL) 325 MG tablet Take 650 mg by mouth every 4 (four) hours as needed for fever.    Marland Kitchen albuterol (PROVENTIL HFA;VENTOLIN HFA) 108 (90 BASE) MCG/ACT inhaler Inhale 2 puffs into the lungs every 6 (six) hours as needed for wheezing or shortness of breath.    Marland Kitchen albuterol (PROVENTIL) (2.5 MG/3ML) 0.083% nebulizer solution Take 3 mLs (2.5 mg total) by nebulization every 4 (four) hours as needed for wheezing or shortness of breath. 75 mL 12  . ALPRAZolam (XANAX) 0.5 MG tablet Take 1 tablet (0.5  mg total) by mouth 3 (three) times daily as needed for anxiety. 10 tablet 0  . desvenlafaxine (PRISTIQ) 100 MG 24 hr tablet Take 100 mg by mouth daily with breakfast.     . docusate sodium (COLACE) 100 MG capsule Take 100 mg by mouth 2 (two) times daily.     . fluticasone (FLONASE) 50 MCG/ACT nasal spray Place 2 sprays into both nostrils daily.    . fluticasone furoate-vilanterol (BREO ELLIPTA) 200-25 MCG/INH AEPB Inhale 1 puff into the lungs daily after breakfast.     . furosemide (LASIX) 20 MG tablet Take 20 mg by mouth daily.    Marland Kitchen gabapentin (NEURONTIN) 100 MG capsule Take 200 mg by mouth 4 (four) times daily.     Marland Kitchen HYDROcodone-acetaminophen (NORCO) 10-325 MG tablet Take 1 tablet by mouth every 6 (six) hours as needed. 30 tablet 0  . hydroxypropyl methylcellulose / hypromellose (ISOPTO TEARS / GONIOVISC) 2.5 % ophthalmic solution Place 1 drop into both eyes every 12 (twelve) hours as needed for dry eyes.    . insulin lispro (HUMALOG) 100 UNIT/ML injection Inject into the skin 3 (three) times daily before meals. Per sliding scale: if blood sugars is 150-200 give 2 units, 201-250 give 4 units, 251-300 give 6 units, 301-350 give 8 units, 351-800 give 10 units subcutaneous before meals    . ipratropium-albuterol (DUONEB) 0.5-2.5 (3) MG/3ML SOLN  Take 3 mLs by nebulization every 6 (six) hours as needed (COPD).    Marland Kitchen levocetirizine (XYZAL) 5 MG tablet Take 5 mg by mouth every evening.    . magnesium hydroxide (MILK OF MAGNESIA) 400 MG/5ML suspension Take 30 mLs by mouth as needed for mild constipation.    . Menthol, Topical Analgesic, (ICY HOT EX) Apply 1 patch topically daily. Leave on for 8 hours    . methadone (DOLOPHINE) 5 MG tablet Take 2.5 mg by mouth every 8 (eight) hours.    Marland Kitchen omeprazole (PRILOSEC) 20 MG capsule Take 20 mg by mouth daily before breakfast.    . oxybutynin (DITROPAN-XL) 5 MG 24 hr tablet Take 5 mg by mouth daily with breakfast.    . OXYGEN Inhale 6 L/min into the lungs continuous.     . polyethylene glycol (MIRALAX / GLYCOLAX) packet Take 17 g by mouth daily. 14 each 0  . prochlorperazine (COMPAZINE) 10 MG tablet Take 10 mg by mouth every 4 (four) hours as needed for nausea or vomiting.    Marland Kitchen QUEtiapine (SEROQUEL) 300 MG tablet Take 300 mg by mouth at bedtime.    Marland Kitchen QUEtiapine (SEROQUEL) 50 MG tablet Take 50 mg by mouth daily with breakfast.    . senna-docusate (SENOKOT-S) 8.6-50 MG tablet Take 2 tablets by mouth 2 (two) times daily as needed for mild constipation. (Patient taking differently: Take 2 tablets by mouth at bedtime. ) 20 tablet 0  . theophylline (THEODUR) 300 MG 12 hr tablet Take 300 mg by mouth 2 (two) times daily.    Marland Kitchen tiotropium (SPIRIVA HANDIHALER) 18 MCG inhalation capsule Place 1 capsule (18 mcg total) into inhaler and inhale daily. 30 capsule 12  . Vitamin D, Ergocalciferol, (DRISDOL) 50000 units CAPS capsule Take 50,000 Units by mouth every Sunday.     Marland Kitchen guaiFENesin (ROBITUSSIN) 100 MG/5ML liquid Take 200 mg by mouth every 4 (four) hours as needed for cough.    . insulin aspart (NOVOLOG) 100 UNIT/ML injection Inject 0-15 Units into the skin 3 (three) times daily with meals. Glucose 150 to 200 use 2 units, for 201 to 250 use 4 units, for 251 to 300 use 6 units, for 301 to 350 use 8 units, for 351 or greater use 10 units. (Patient not taking: Reported on 04/10/2017) 10 mL 11  . UNABLE TO FIND Take 120 mLs by mouth 2 (two) times daily. MED PASS     No current facility-administered medications for this encounter.     Physical Findings: The patient is in no acute distress. Patient is alert and oriented. Presents in wheelchair. Oxygen therapy in place by nasal cannula.   height is 4' 11.5" (1.511 m) and weight is 124 lb 3.2 oz (56.3 kg). Her oral temperature is 98.4 F (36.9 C). Her blood pressure is 117/73 and her pulse is 102 (abnormal). Her oxygen saturation is 94%. .   Lungs are clear to auscultation bilaterally. Heart has regular rate and rhythm. No  palpable cervical, supraclavicular, or axillary adenopathy. Abdomen soft, non-tender, normal bowel sounds.  Lab Findings: Lab Results  Component Value Date   WBC 7.2 02/09/2017   HGB 11.6 02/09/2017   HCT 37.7 02/09/2017   MCV 89.8 02/09/2017   PLT 426 (H) 02/09/2017    Radiographic Findings: No results found.  Impression:  The patient is recovering from the effects of radiation.  Patient is having minimal improvement with pain. Hopefully this will improve in the coming weeks.  Plan:  She will return for  routine follow up in our clinic in 3 months. Refilled patient's hydrocodone-acetaminophen today. She is scheduled to follow up with Dr. Alen Blew the middle of next month.  -----------------------------------  Blair Promise, PhD, MD  This document serves as a record of services personally performed by Gery Pray, MD. It was created on his behalf by Arlyce Harman, a trained medical scribe. The creation of this record is based on the scribe's personal observations and the provider's statements to them. This document has been checked and approved by the attending provider.

## 2017-05-03 IMAGING — DX DG CHEST 2V
2 series · 2 of 2 positions shown · non-contrast
Comparison: 10/08/2016

CLINICAL DATA: Shortness of breath and weakness today.

EXAM:
CHEST  2 VIEW

[chest lat]
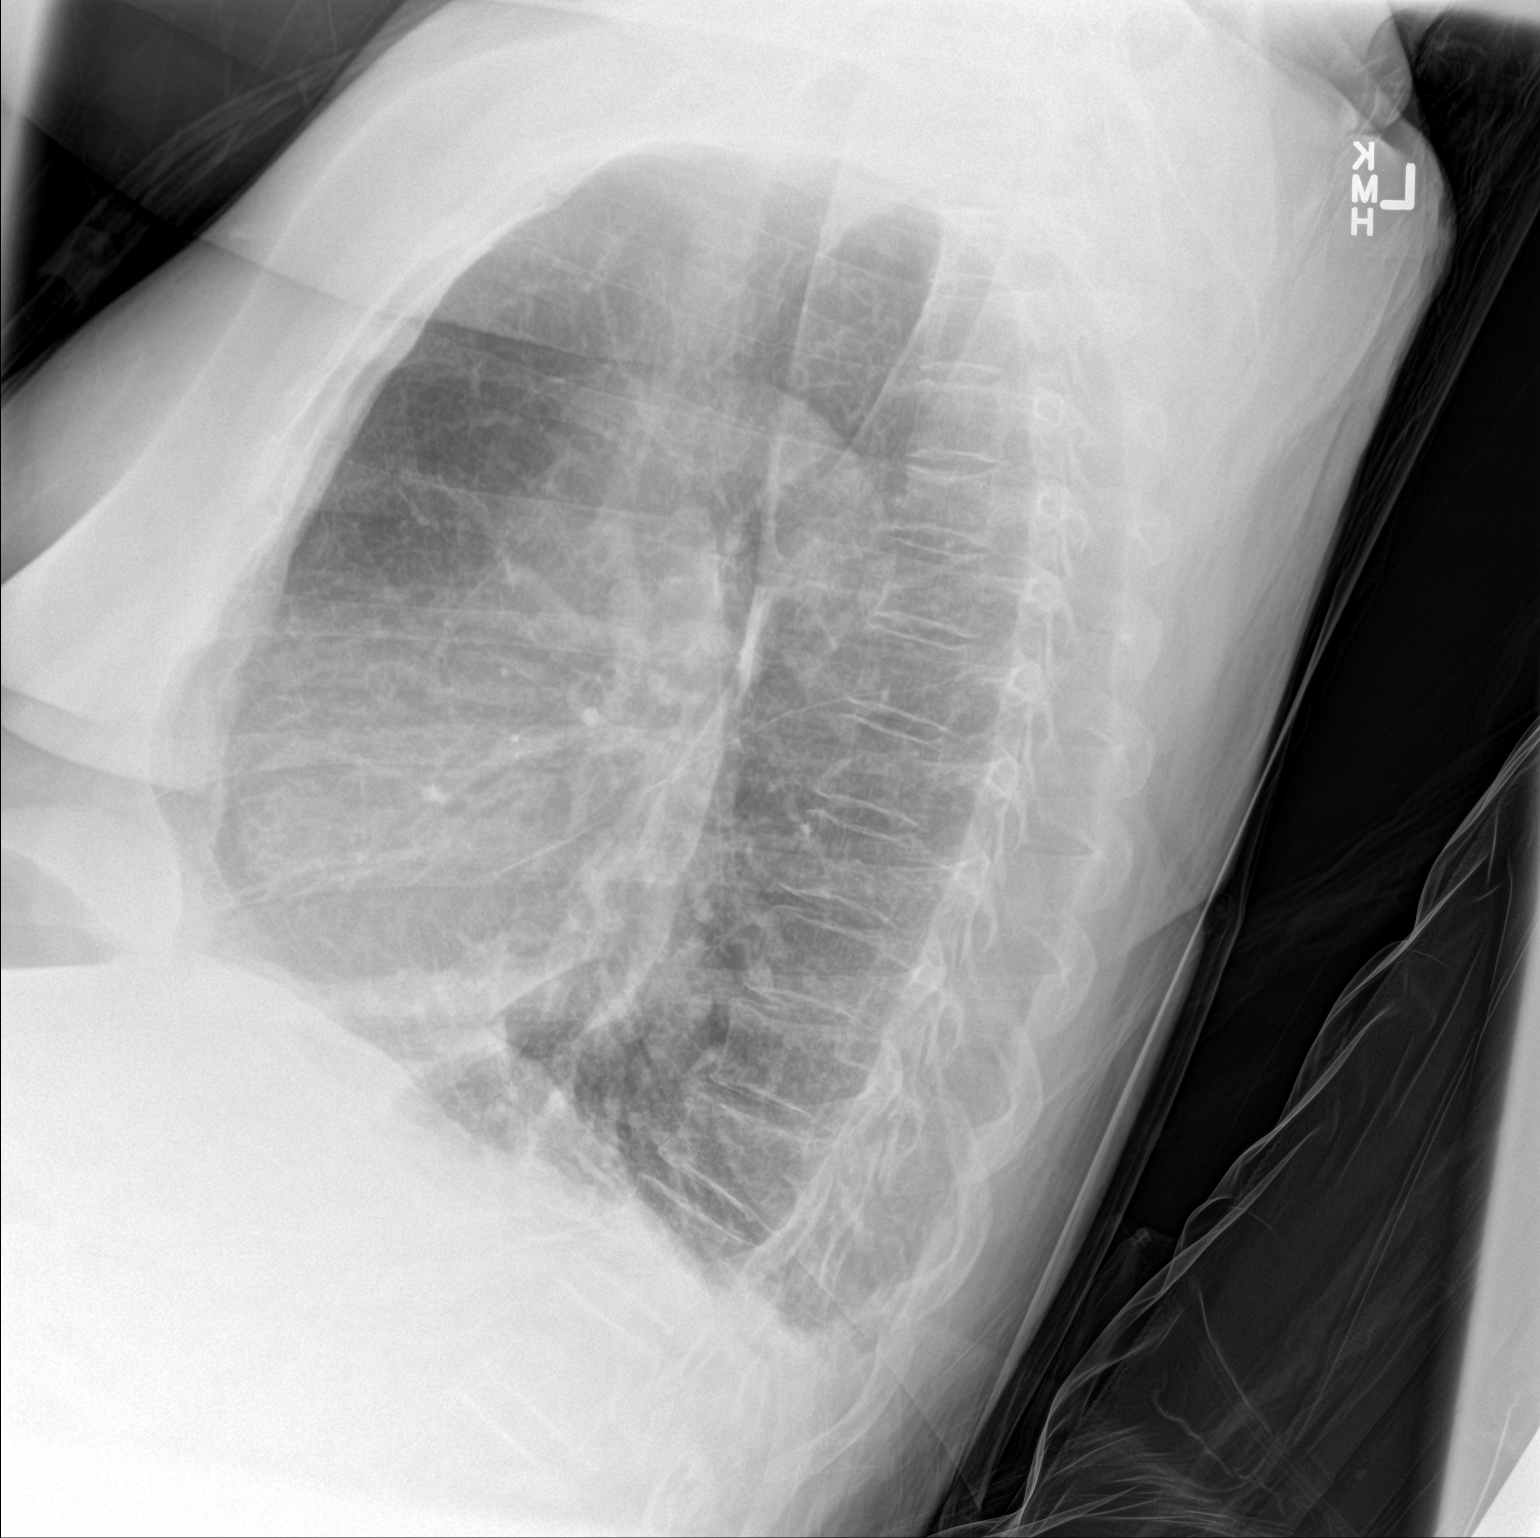

[chest ap]
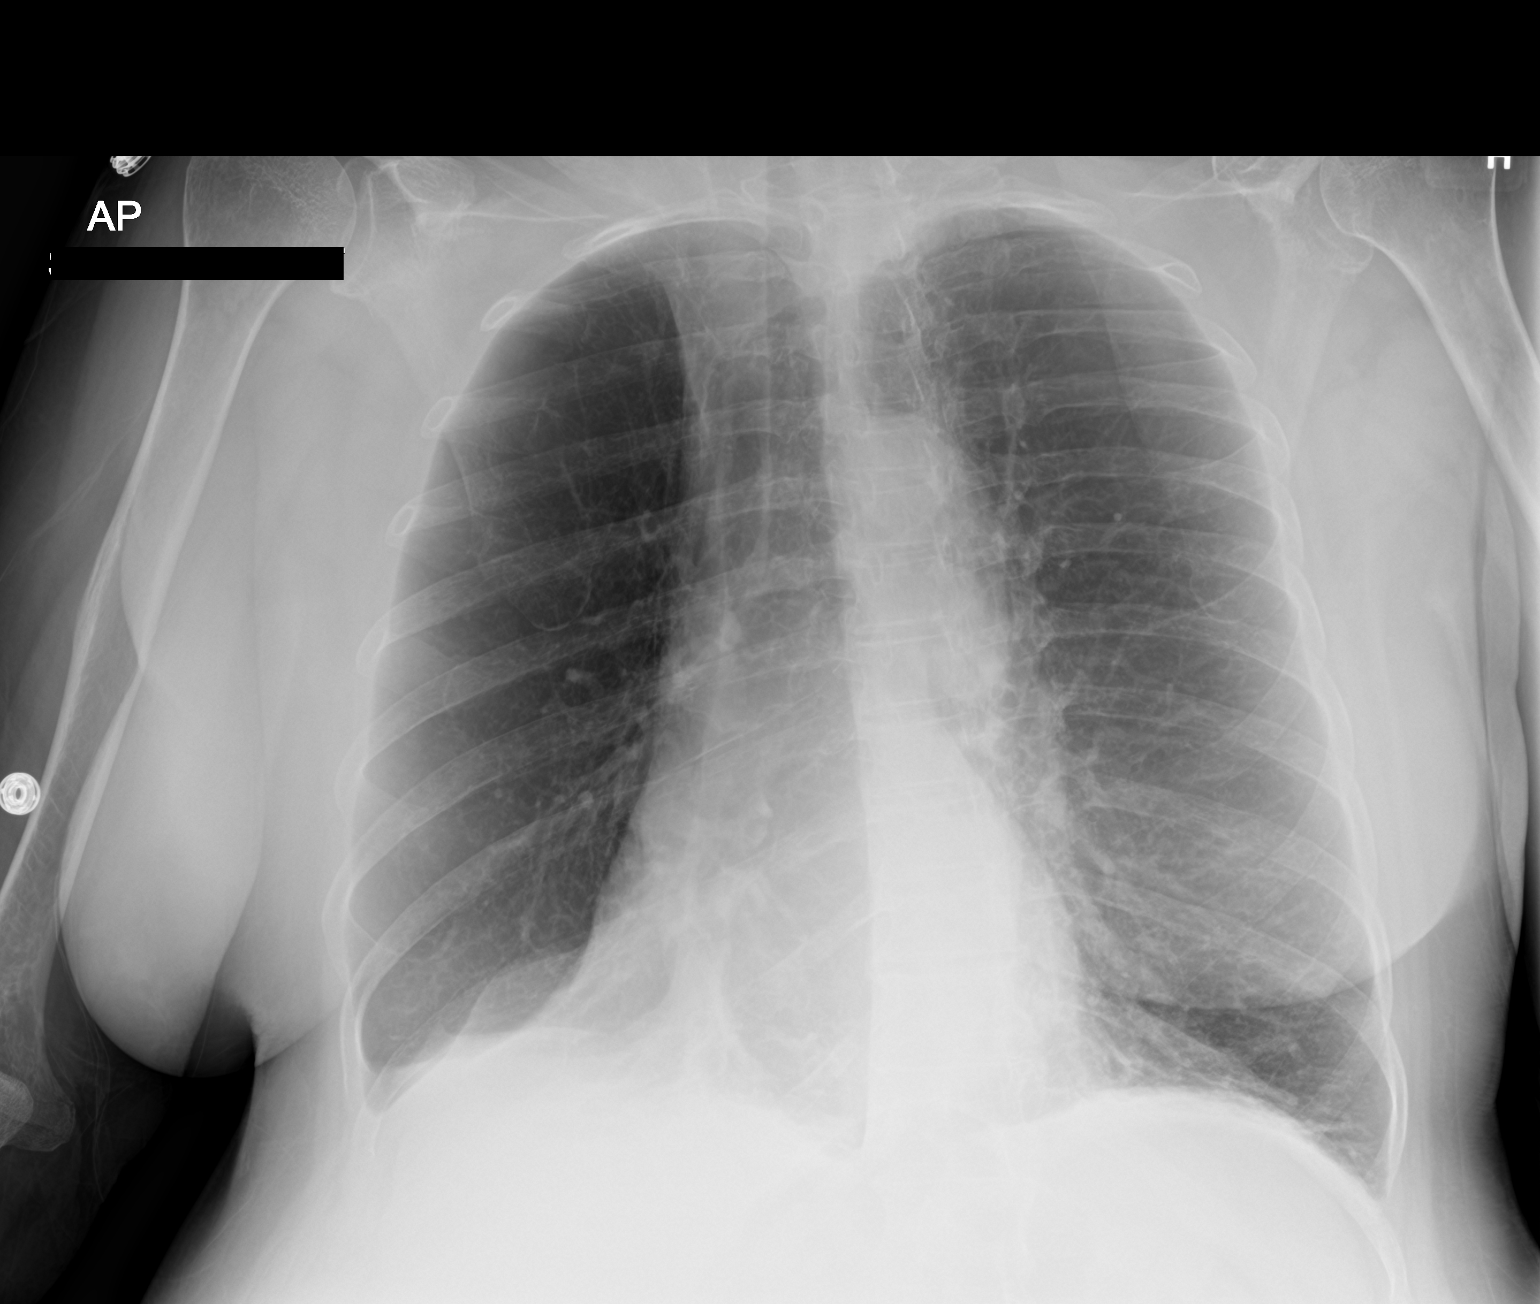

[2 of 2 positions shown; findings below may reference images not displayed]

FINDINGS: There is hyperinflation of the lungs compatible with COPD. Small
right pleural effusion. Linear atelectasis in the lung bases. Heart
is normal size. No acute bony abnormality.
IMPRESSION: COPD.  Small right effusion and bibasilar atelectasis.

## 2017-05-04 ENCOUNTER — Ambulatory Visit: Payer: Medicare Other | Admitting: Oncology

## 2017-05-19 ENCOUNTER — Emergency Department (HOSPITAL_COMMUNITY)
Admission: EM | Admit: 2017-05-19 | Discharge: 2017-05-20 | Disposition: A | Discharge status: Home / Self Care | Attending: Emergency Medicine | Admitting: Emergency Medicine

## 2017-05-19 ENCOUNTER — Encounter (HOSPITAL_COMMUNITY): Payer: Self-pay | Admitting: Emergency Medicine

## 2017-05-19 ENCOUNTER — Other Ambulatory Visit: Payer: Self-pay

## 2017-05-19 DIAGNOSIS — Z79899 Other long term (current) drug therapy: Secondary | ICD-10-CM | POA: Diagnosis not present

## 2017-05-19 DIAGNOSIS — E119 Type 2 diabetes mellitus without complications: Secondary | ICD-10-CM | POA: Insufficient documentation

## 2017-05-19 DIAGNOSIS — R4182 Altered mental status, unspecified: Secondary | ICD-10-CM | POA: Insufficient documentation

## 2017-05-19 DIAGNOSIS — Y92129 Unspecified place in nursing home as the place of occurrence of the external cause: Secondary | ICD-10-CM

## 2017-05-19 DIAGNOSIS — M25552 Pain in left hip: Secondary | ICD-10-CM | POA: Insufficient documentation

## 2017-05-19 DIAGNOSIS — F0390 Unspecified dementia without behavioral disturbance: Secondary | ICD-10-CM | POA: Insufficient documentation

## 2017-05-19 DIAGNOSIS — J449 Chronic obstructive pulmonary disease, unspecified: Secondary | ICD-10-CM | POA: Insufficient documentation

## 2017-05-19 DIAGNOSIS — J45909 Unspecified asthma, uncomplicated: Secondary | ICD-10-CM | POA: Insufficient documentation

## 2017-05-19 DIAGNOSIS — W19XXXA Unspecified fall, initial encounter: Secondary | ICD-10-CM

## 2017-05-19 DIAGNOSIS — Z794 Long term (current) use of insulin: Secondary | ICD-10-CM | POA: Diagnosis not present

## 2017-05-19 DIAGNOSIS — Z87891 Personal history of nicotine dependence: Secondary | ICD-10-CM | POA: Insufficient documentation

## 2017-05-19 NOTE — ED Notes (Signed)
Bed: HT09 Expected date:  Expected time:  Means of arrival:  Comments: 66 yo F Fall from [place]

## 2017-05-19 NOTE — ED Triage Notes (Signed)
Pt brought in by EMS from [facility] for further evaluation after her unwitnessed fall tonight.  Pt was found lying on floor beside bed which was positioned to its lowest, less that an foot in height.  No s/s apparent injury noted from fall, but SNF staff remarked that pt "less takative" after fall.  Pt received her 9pm night meds which include seroquel 300 mg, xanax and percocet.  Pt on continuous O2 at 2LPM via , and on hospice care.

## 2017-05-20 ENCOUNTER — Emergency Department (HOSPITAL_COMMUNITY)

## 2017-05-20 NOTE — Discharge Instructions (Signed)
Her head CT is without acute changes and her AP Pelvis xray do not show any pelvic fracture. Return for any other concerns or any problems listed on the head injury sheet.

## 2017-05-20 NOTE — ED Notes (Signed)
[facility] was called for pt's transportation back to [facility]. 

## 2017-05-20 NOTE — ED Notes (Signed)
[facility] here to transport pt back to [facility].

## 2017-05-20 NOTE — ED Provider Notes (Signed)
[facility] DEPT Provider Note   CSN: [id number] Arrival date & time: 05/19/17  2330  Time seen 1:08 AM   History   Chief Complaint Chief Complaint  Patient presents with  . Fall   Level 5 caveat for altered mental status  HPI Traci Thomas is a 66 y.o. female.  HPI patient was brought to emergency department tonight by EMS.  Her nursing facility states she had a unwitnessed fall tonight.  She was found lying on the floor next to her bed which had been lowered so she only felt less than a foot.  Patient denies falling but does not know what happened or why she is here.  She is very sleepy however I told her she was at the hospital and later she did tell me she was at the hospital.  She had already been given Seroquel, Xanax and Percocet at bedtime.  Patient is in hospice care.  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  Her son entered the room just as I was leaving.  He was talking to his mother.  Past Medical History:  Diagnosis Date  . Asthma   . Back pain   . COPD (chronic obstructive pulmonary disease) (HCC)    Home O2 3lpm, theophylline  . Diabetes mellitus without complication ([place])   . History of radiation therapy 02/16/17-03/09/17   pelvis left 2.5 Gy in 14 fractions    Patient Active Problem List   Diagnosis Date Noted  . 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 Pelvic mass   . Palliative care by specialist   . HCAP (healthcare-associated pneumonia)   . Gross hematuria 01/11/2017  . Hydronephrosis of left kidney 01/11/2017  . Acute respiratory failure with hypoxia and hypercapnia ([place]) 01/03/2017  . Acute respiratory acidosis 01/03/2017  . Normocytic anemia 01/03/2017  . Chronic respiratory failure with hypoxia ([place]) 11/30/2016  . Constipation 11/02/2016  . Lobar pneumonia ([place]) 10/04/2016  . Acute metabolic encephalopathy 87/86/7672  . Abdominal pain   . Sepsis ([place])   .  Hip fracture ([place]) 10/21/2015  . Stage 4 very severe COPD by GOLD classification ([place]) 10/21/2015  . Anemia 10/21/2015  . Dementia 10/21/2015  . Goals of care, counseling/discussion 10/21/2015    Class: Acute  . Closed right hip fracture ([place])   . Encounter for palliative care   . COPD exacerbation ([place]) 03/25/2015  . Diabetes mellitus type 2 in nonobese ([place]) 03/25/2015  . Depression 03/25/2015    Past Surgical History:  Procedure Laterality Date  . APPENDECTOMY    . bladder repair  1997   [facility] - took muscle from her left leg to replace her bladder muscle  . FEMUR IM NAIL Right 10/21/2015   Procedure: INTRAMEDULLARY (IM) NAIL FEMORAL;  Surgeon: Meredith Pel, MD;  Location: [facility];  Service: Orthopedics;  Laterality: Right;  . TONSILLECTOMY      OB History    No data available       Home Medications    Prior to Admission medications   Medication Sig Start Date End Date Taking? Authorizing Provider  acetaminophen (TYLENOL) 325 MG tablet Take 650 mg by mouth every 4 (four) hours as needed for fever.   Yes [provider]  albuterol (PROVENTIL HFA;VENTOLIN HFA) 108 (90 BASE) MCG/ACT inhaler Inhale 2 puffs into the lungs every 6 (six) hours as needed for wheezing or shortness of breath.   Yes  [provider]  albuterol (PROVENTIL) (2.5 MG/3ML) 0.083% nebulizer solution Take 3 mLs (2.5 mg total) by nebulization every 4 (four) hours as needed for wheezing or shortness of breath. 10/17/16  Yes Arrien, Jimmy Picket, MD  ALPRAZolam Duanne Moron) 0.5 MG tablet Take 1 tablet (0.5 mg total) by mouth 3 (three) times daily as needed for anxiety. Patient taking differently: Take 1 mg by mouth 3 (three) times daily.  01/17/17  Yes Patrecia Pour, MD  desvenlafaxine (PRISTIQ) 100 MG 24 hr tablet Take 100 mg by mouth daily with breakfast.    Yes [provider]  docusate sodium (COLACE) 100 MG capsule Take 100 mg by mouth 2 (two) times daily.    Yes [provider]  fluticasone (FLONASE) 50 MCG/ACT nasal spray Place 2 sprays into both nostrils daily.   Yes [provider]  fluticasone furoate-vilanterol (BREO ELLIPTA) 200-25 MCG/INH AEPB Inhale 1 puff into the lungs daily after breakfast.    Yes [provider]  furosemide (LASIX) 20 MG tablet Take 20 mg by mouth daily.   Yes [provider]  gabapentin (NEURONTIN) 100 MG capsule Take 200 mg by mouth 4 (four) times daily.    Yes [provider]  guaiFENesin (ROBITUSSIN) 100 MG/5ML liquid Take 200 mg by mouth every 4 (four) hours as needed for cough.   Yes [provider]  HYDROcodone-acetaminophen (NORCO) 10-325 MG tablet Take 1 tablet by mouth every 6 (six) hours as needed. Patient taking differently: Take 1 tablet by mouth every 6 (six) hours as needed for moderate pain or severe pain.  04/10/17  Yes Gery Pray, MD  hydroxypropyl methylcellulose / hypromellose (ISOPTO TEARS / GONIOVISC) 2.5 % ophthalmic solution Place 1 drop into both eyes every 12 (twelve) hours as needed for dry eyes.   Yes [provider]  insulin lispro (HUMALOG) 100 UNIT/ML injection Inject into the skin 3 (three) times daily before meals. Per sliding scale: if blood sugars is 150-200 give 2 units, 201-250 give 4 units, 251-300 give 6 units, 301-350 give 8 units, 351-800 give 10 units subcutaneous before meals   Yes [provider]  ipratropium-albuterol (DUONEB) 0.5-2.5 (3) MG/3ML SOLN Take 3 mLs by nebulization every 6 (six) hours.    Yes [provider]  lactulose (CHRONULAC) 10 GM/15ML solution Take 10 g by mouth daily as needed for mild constipation.   Yes [provider]  levocetirizine (XYZAL) 5 MG tablet Take 5 mg by mouth every evening.   Yes [provider]  magnesium hydroxide (MILK OF MAGNESIA) 400 MG/5ML suspension Take 30 mLs by mouth as needed for mild constipation.   Yes [provider]  Menthol, Topical  Analgesic, (ICY HOT EX) Apply 1 patch topically 2 (two) times daily.    Yes [provider]  methadone (DOLOPHINE) 5 MG tablet Take 2.5 mg by mouth every 8 (eight) hours.   Yes [provider]  omeprazole (PRILOSEC) 20 MG capsule Take 20 mg by mouth daily before breakfast.   Yes [provider]  oxybutynin (DITROPAN-XL) 5 MG 24 hr tablet Take 5 mg by mouth daily with breakfast.   Yes [provider]  OXYGEN Inhale 6 L/min into the lungs continuous.   Yes [provider]  polyethylene glycol (MIRALAX / GLYCOLAX) packet Take 17 g by mouth daily. 02/04/17  Yes Sherwood Gambler, MD  prochlorperazine (COMPAZINE) 10 MG tablet Take 10 mg by mouth every 4 (four) hours as needed for nausea or vomiting.  Yes [provider]  QUEtiapine (SEROQUEL) 300 MG tablet Take 300 mg by mouth at bedtime.   Yes [provider]  QUEtiapine (SEROQUEL) 50 MG tablet Take 50 mg by mouth daily with breakfast.   Yes [provider]  senna-docusate (SENOKOT-S) 8.6-50 MG tablet Take 2 tablets by mouth 2 (two) times daily as needed for mild constipation. Patient taking differently: Take 2 tablets by mouth daily.  01/17/17  Yes Patrecia Pour, MD  theophylline (THEODUR) 300 MG 12 hr tablet Take 300 mg by mouth 2 (two) times daily.   Yes [provider]  tiotropium (SPIRIVA HANDIHALER) 18 MCG inhalation capsule Place 1 capsule (18 mcg total) into inhaler and inhale daily. 03/31/15  Yes Short, Noah Delaine, MD  UNABLE TO FIND Take 120 mLs by mouth 2 (two) times daily. MED PASS   Yes [provider]  Vitamin D, Ergocalciferol, (DRISDOL) 50000 units CAPS capsule Take 50,000 Units by mouth every Sunday.    Yes [provider]  insulin aspart (NOVOLOG) 100 UNIT/ML injection Inject 0-15 Units into the skin 3 (three) times daily with meals. Glucose 150 to 200 use 2 units, for 201 to 250 use 4 units, for 251 to 300 use 6 units, for 301 to 350 use 8  units, for 351 or greater use 10 units. Patient not taking: Reported on 04/10/2017 10/17/16   Arrien, Jimmy Picket, MD    Family History Family History  Problem Relation Age of Onset  . COPD Mother   . Skin cancer Mother   . Diabetes Unknown   . Colon cancer Sister   . Testicular cancer Brother     Social History Social History   Tobacco Use  . Smoking status: Former Smoker    Packs/day: 1.00    Years: 50.00    Pack years: 50.00    Types: Cigarettes    Last attempt to quit: 2016    Years since quitting: 2.9  . Smokeless tobacco: Never Used  . Tobacco comment: started smoking 2017 to current sometimes 1 daily or once a month  Substance Use Topics  . Alcohol use: No    Alcohol/week: 0.0 oz  . Drug use: No  lives in [place]; Aspirin; Ciprofloxacin; Latex; and Prednisone   Review of Systems Review of Systems  Unable to perform ROS: Mental status change     Physical Exam Updated Vital Signs BP 115/69 (BP Location: Left Arm)   Pulse (!) 101   Temp 97.8 F (36.6 C) (Oral)   Resp 19   SpO2 95%   Vital signs normal except tachycardia   Physical Exam  Constitutional: She appears well-developed and well-nourished.  Non-toxic appearance. She does not appear ill. No distress.  Patient is sleeping when I enter the room however she is awake and verbal and she does talk to me.  She still seems very slow in mentation.  HENT:  Head: Normocephalic and atraumatic.  Right Ear: External ear normal.  Left Ear: External ear normal.  Nose: Nose normal. No mucosal edema or rhinorrhea.  Mouth/Throat: Oropharynx is clear and moist and mucous membranes are normal. No dental abscesses or uvula swelling.  Eyes: Conjunctivae and EOM are normal. Pupils are equal, round, and reactive to light.  Neck: Normal range of motion and full passive range of motion without pain. Neck supple.  Cardiovascular: Normal rate, regular rhythm and normal heart sounds. Exam  reveals no gallop and no friction rub.  No murmur  heard. Pulmonary/Chest: Effort normal and breath sounds normal. No respiratory distress. She has no wheezes. She has no rhonchi. She has no rales. She exhibits no tenderness and no crepitus.  Abdominal: Soft. Normal appearance and bowel sounds are normal. She exhibits no distension. There is no tenderness. There is no rebound and no guarding.  Musculoskeletal: Normal range of motion. She exhibits no edema or tenderness.  When I move her extremities she may have some discomfort in her left hip with range of motion.  However there is no shortening of the leg or external or internal rotation.  There is no obvious deformity seen in the hip area.  Neurological: She has normal strength. No cranial nerve deficit.  Patient is slow to respond but she does follow commands  Skin: Skin is warm, dry and intact. No rash noted. No erythema. No pallor.  Psychiatric: Her speech is delayed. She is slowed.  Nursing note and vitals reviewed.    ED Treatments / Results  Labs (all labs ordered are listed, but only abnormal results are displayed) Labs Reviewed - No data to display  EKG  EKG Interpretation None       Radiology Dg Pelvis 1-2 Views  Result Date: 05/20/2017 CLINICAL DATA:  Unwitnessed fall tonight.  Question left hip pain. EXAM: PELVIS - 1-2 VIEW COMPARISON:  None. FINDINGS: The cortical margins of the bony pelvis are intact. No fracture. Pubic symphysis and sacroiliac joints are congruent. Both femoral heads are well-seated in the respective acetabula. Intramedullary nail with trans trochanteric screws traverse the right proximal femur. Distal aspect of the femoral stem not included in this pelvic field of view. IMPRESSION: No evidence of pelvic fracture. Surgical hardware in the right hip is intact where included. Electronically Signed   By: Jeb Levering M.D.   On: 05/20/2017 02:22   Ct Head Wo Contrast  Result Date: 05/20/2017 CLINICAL  DATA:  Altered level of consciousness. Post unwitnessed fall tonight. EXAM: CT HEAD WITHOUT CONTRAST TECHNIQUE: Contiguous axial images were obtained from the base of the skull through the vertex without intravenous contrast. COMPARISON:  Head CT 01/06/2017 FINDINGS: Brain: Generalized atrophy, advanced for age. Moderate chronic small vessel ischemia. No intracranial hemorrhage, mass effect, or midline shift. No hydrocephalus. The basilar cisterns are patent. No evidence of territorial infarct or acute ischemia. No extra-axial or intracranial fluid collection. Vascular: Atherosclerosis of skullbase vasculature without hyperdense vessel or abnormal calcification. Skull: No fracture or focal lesion. Sinuses/Orbits: Chronic opacification of bilateral mastoid air cells. Mild sinus mucosal thickening. No acute finding. Other: None. IMPRESSION: 1.  No acute intracranial abnormality. 2. Stable age advanced atrophy and chronic small vessel ischemia. Electronically Signed   By: Jeb Levering M.D.   On: 05/20/2017 03:54    Procedures Procedures (including critical care time)  Medications Ordered in ED Medications - No data to display   Initial Impression / Assessment and Plan / ED Course  I have reviewed the triage vital signs and the nursing notes.  Pertinent labs & imaging results that were available during my care of the patient were reviewed by me and considered in my medical decision making (see chart for details).    Although patient denies having problems at this time the concern at her nursing facility was that she was complaining of a headache.  CT of the head was done.  I did a AP pelvis just to make sure there was no obvious hip problem.  She had good range of motion but said it was  painful on the left. She seemed less painful on the right.   Recheck at 4:35 AM patient is sleeping but easily arousable.  She tells me she is at the hospital and she gives me her son's name.  She states she did not  fell out of the bed however she then said that they put her bed up high so when she falls she falls a long way.  However EMS was told that her bed is kept low to the floor.  I talked to the son about double checking that when they get back to the facility.  He is comfortable with her going back to the facility now.  She seems to be appropriately sleepy with what she was given at bedtime.  Final Clinical Impressions(s) / ED Diagnoses   Final diagnoses:  Fall at nursing home, initial encounter    Plan discharge  Rolland Porter, MD, Barbette Or, MD 05/20/17 [phone]

## 2017-07-17 ENCOUNTER — Ambulatory Visit
Admission: RE | Admit: 2017-07-17 | Discharge: 2017-07-17 | Disposition: A | Discharge status: Home / Self Care | Payer: Medicare Other | Source: Ambulatory Visit | Attending: Radiation Oncology | Admitting: Radiation Oncology

## 2017-09-14 ENCOUNTER — Other Ambulatory Visit: Payer: Self-pay

## 2017-09-14 ENCOUNTER — Emergency Department (HOSPITAL_COMMUNITY)

## 2017-09-14 ENCOUNTER — Encounter (HOSPITAL_COMMUNITY): Payer: Self-pay

## 2017-09-14 ENCOUNTER — Emergency Department (HOSPITAL_COMMUNITY)
Admission: EM | Admit: 2017-09-14 | Discharge: 2017-09-14 | Disposition: A | Discharge status: Home / Self Care | Attending: Emergency Medicine | Admitting: Emergency Medicine

## 2017-09-14 DIAGNOSIS — Y998 Other external cause status: Secondary | ICD-10-CM | POA: Diagnosis not present

## 2017-09-14 DIAGNOSIS — E119 Type 2 diabetes mellitus without complications: Secondary | ICD-10-CM | POA: Insufficient documentation

## 2017-09-14 DIAGNOSIS — W050XXA Fall from non-moving wheelchair, initial encounter: Secondary | ICD-10-CM | POA: Insufficient documentation

## 2017-09-14 DIAGNOSIS — F0390 Unspecified dementia without behavioral disturbance: Secondary | ICD-10-CM | POA: Insufficient documentation

## 2017-09-14 DIAGNOSIS — Y92129 Unspecified place in nursing home as the place of occurrence of the external cause: Secondary | ICD-10-CM | POA: Insufficient documentation

## 2017-09-14 DIAGNOSIS — R4182 Altered mental status, unspecified: Secondary | ICD-10-CM | POA: Insufficient documentation

## 2017-09-14 DIAGNOSIS — S32048A Other fracture of fourth lumbar vertebra, initial encounter for closed fracture: Secondary | ICD-10-CM | POA: Insufficient documentation

## 2017-09-14 DIAGNOSIS — Z794 Long term (current) use of insulin: Secondary | ICD-10-CM | POA: Diagnosis not present

## 2017-09-14 DIAGNOSIS — Y939 Activity, unspecified: Secondary | ICD-10-CM | POA: Diagnosis not present

## 2017-09-14 DIAGNOSIS — Z79899 Other long term (current) drug therapy: Secondary | ICD-10-CM | POA: Diagnosis not present

## 2017-09-14 DIAGNOSIS — S3992XA Unspecified injury of lower back, initial encounter: Secondary | ICD-10-CM | POA: Diagnosis present

## 2017-09-14 DIAGNOSIS — J449 Chronic obstructive pulmonary disease, unspecified: Secondary | ICD-10-CM | POA: Insufficient documentation

## 2017-09-14 DIAGNOSIS — W19XXXA Unspecified fall, initial encounter: Secondary | ICD-10-CM

## 2017-09-14 DIAGNOSIS — Z9981 Dependence on supplemental oxygen: Secondary | ICD-10-CM | POA: Diagnosis not present

## 2017-09-14 DIAGNOSIS — Z720 Tobacco use: Secondary | ICD-10-CM | POA: Insufficient documentation

## 2017-09-14 NOTE — ED Notes (Signed)
PTAR picked up patient and transported back to [facility]. This nurse attempted multiple times to call report to [facility], but no one answered. Patient's son updated on patient discharge paperwork and instructions. Patient's son verbalizes understanding and signed for patient. Patient paperwork, MOST form, and belongings sent with PTAR team back to [facility].

## 2017-09-14 NOTE — ED Triage Notes (Addendum)
Patient BIB EMS from [facility] on [address]. Patient suffered a witnessed fall forward from her wheelchair. Staff denies patient LOC. Staff denies patient on blood thinners. Patient has history of confusion, but per facility staff, the patient is at her baseline. AxO to self in triage. Patient is complaining of thoracic back pain and small laceration to patient chin. Patient lives on [facility] at the facility. Course lung sounds noted in triage. EMS increased patient oxygen to 4LNC due to low oxygen saturations.CBG=189 for EMS.  EMS vitals: 114/70, 114, 20, 94% 4LNC MOST form left by EMS at patient bedside.

## 2017-09-14 NOTE — ED Provider Notes (Signed)
[facility] DEPT Provider Note   CSN: [id number] Arrival date & time: 09/14/17  1452     History   Chief Complaint Chief Complaint  Patient presents with  . Fall  . Back Pain    HPI Traci Thomas is a 67 y.o. female.  27 yoF with a chief complaint of a fall from her wheelchair.  The patient leaned forward in the wheelchair toppled forward.  She landed on her chin.  Denied loss of consciousness.  Not on blood thinners.  The patient was complaining of some back pain and so the decision opponents made to send her to the ED.  The patient is currently on hospice.  She is on 3 L oxygen at all times.  She is altered on my examination which is at her baseline per the family.  Level 5 caveat altered mental status.  The history is provided by the patient.  Fall  This is a new problem. The current episode started less than 1 hour ago. The problem occurs constantly. The problem has not changed since onset.Pertinent negatives include no chest pain, no abdominal pain, no headaches and no shortness of breath. Nothing aggravates the symptoms. Nothing relieves the symptoms. She has tried nothing for the symptoms. The treatment provided no relief.  Back Pain   Pertinent negatives include no chest pain, no fever, no headaches, no abdominal pain and no dysuria.    Past Medical History:  Diagnosis Date  . Asthma   . Back pain   . COPD (chronic obstructive pulmonary disease) (HCC)    Home O2 3lpm, theophylline  . Diabetes mellitus without complication ([employer])   . History of radiation therapy 02/16/17-03/09/17   pelvis left 2.5 Gy in 14 fractions    Patient Active Problem List   Diagnosis Date Noted  . 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 Pelvic mass   . Palliative care by specialist   . HCAP (healthcare-associated pneumonia)   . Gross hematuria 01/11/2017  . Hydronephrosis of left kidney 01/11/2017  . Acute respiratory failure with hypoxia  and hypercapnia ([place]) 01/03/2017  . Acute respiratory acidosis 01/03/2017  . Normocytic anemia 01/03/2017  . Chronic respiratory failure with hypoxia ([place]) 11/30/2016  . Constipation 11/02/2016  . Lobar pneumonia ([place]) 10/04/2016  . Acute metabolic encephalopathy 82/99/3716  . Abdominal pain   . Sepsis ([place])   . Hip fracture ([place]) 10/21/2015  . Stage 4 very severe COPD by GOLD classification ([place]) 10/21/2015  . Anemia 10/21/2015  . Dementia 10/21/2015  . Goals of care, counseling/discussion 10/21/2015    Class: Acute  . Closed right hip fracture ([place])   . Encounter for palliative care   . COPD exacerbation ([place]) 03/25/2015  . Diabetes mellitus type 2 in nonobese ([place]) 03/25/2015  . Depression 03/25/2015    Past Surgical History:  Procedure Laterality Date  . APPENDECTOMY    . bladder repair  1997   [facility] - took muscle from her left leg to replace her bladder muscle  . FEMUR IM NAIL Right 10/21/2015   Procedure: INTRAMEDULLARY (IM) NAIL FEMORAL;  Surgeon: Meredith Pel, MD;  Location: [facility];  Service: Orthopedics;  Laterality: Right;  . TONSILLECTOMY       OB History   None      Home Medications    Prior to Admission medications   Medication Sig Start Date End Date Taking? Authorizing Provider  ALPRAZolam Duanne Moron) 0.5 MG tablet Take 1 tablet (0.5 mg  total) by mouth 3 (three) times daily as needed for anxiety. Patient taking differently: Take 1 mg by mouth 3 (three) times daily.  01/17/17  Yes Patrecia Pour, MD  docusate sodium (COLACE) 100 MG capsule Take 100 mg by mouth 2 (two) times daily.    Yes [provider]  fluticasone (FLONASE) 50 MCG/ACT nasal spray Place 2 sprays into both nostrils daily.   Yes [provider]  fluticasone furoate-vilanterol (BREO ELLIPTA) 200-25 MCG/INH AEPB Inhale 1 puff into the lungs daily after breakfast.    Yes [provider]  furosemide (LASIX) 20 MG tablet Take 20 mg by mouth daily.   Yes [provider]  gabapentin (NEURONTIN) 100 MG capsule Take 200 mg by mouth 4 (four) times daily.    Yes [provider]  hydroxypropyl methylcellulose / hypromellose (ISOPTO TEARS / GONIOVISC) 2.5 % ophthalmic solution Place 1 drop into both eyes every 12 (twelve) hours as needed for dry eyes.   Yes [provider]  insulin lispro (HUMALOG) 100 UNIT/ML injection Inject into the skin 3 (three) times daily before meals. Per sliding scale: if blood sugars is 150-200 give 2 units, 201-250 give 4 units, 251-300 give 6 units, 301-350 give 8 units, 351-800 give 10 units subcutaneous before meals   Yes [provider]  ipratropium-albuterol (DUONEB) 0.5-2.5 (3) MG/3ML SOLN Take 3 mLs by nebulization every 6 (six) hours.    Yes [provider]  levocetirizine (XYZAL) 5 MG tablet Take 5 mg by mouth every evening.   Yes [provider]  magnesium hydroxide (MILK OF MAGNESIA) 400 MG/5ML suspension Take 30 mLs by mouth as needed for mild constipation.   Yes [provider]  methadone (DOLOPHINE) 5 MG tablet Take 5 mg by mouth 3 (three) times daily.    Yes [provider]  omeprazole (PRILOSEC) 20 MG capsule Take 20 mg by mouth daily before breakfast.   Yes [provider]  oxybutynin (DITROPAN-XL) 5 MG 24 hr tablet Take 5 mg by mouth daily with breakfast.   Yes [provider]  polyethylene glycol (MIRALAX / GLYCOLAX) packet Take 17 g by mouth daily. 02/04/17  Yes Sherwood Gambler, MD  prochlorperazine (COMPAZINE) 10 MG tablet Take 10 mg by mouth every 4 (four) hours as needed for nausea or vomiting.   Yes [provider]  QUEtiapine (SEROQUEL) 300 MG tablet Take 300 mg by mouth at bedtime.   Yes [provider]  QUEtiapine (SEROQUEL) 50 MG tablet Take 50 mg by mouth daily with breakfast.   Yes [provider]  theophylline (THEODUR) 300 MG 12 hr tablet Take 300 mg by mouth 2 (two) times daily.   Yes [provider]  tiotropium (SPIRIVA HANDIHALER) 18 MCG inhalation capsule Place 1 capsule (18 mcg total) into inhaler and inhale daily. 03/31/15  Yes Short, Noah Delaine, MD  Vitamin D, Ergocalciferol, (DRISDOL) 50000 units CAPS capsule Take 50,000 Units by mouth every Sunday.    Yes [provider]  albuterol (PROVENTIL) (2.5 MG/3ML) 0.083% nebulizer solution Take 3 mLs (2.5 mg total) by nebulization every 4 (four) hours as needed for wheezing or shortness of breath. 10/17/16   Arrien, Jimmy Picket, MD  OXYGEN Inhale 6 L/min into the lungs continuous.    [provider]  senna-docusate (SENOKOT-S) 8.6-50 MG tablet Take 2 tablets by mouth 2 (two) times daily as needed for mild constipation. Patient taking differently: Take 2 tablets by mouth daily.  01/17/17   Patrecia Pour,  MD    Family History Family History  Problem Relation Age of Onset  . COPD Mother   . Skin cancer Mother   . Diabetes Unknown   . Colon cancer Sister   . Testicular cancer Brother     Social History Social History   Tobacco Use  . Smoking status: Former Smoker    Packs/day: 1.00    Years: 50.00    Pack years: 50.00    Types: Cigarettes    Last attempt to quit: 2016    Years since quitting: 3.2  . Smokeless tobacco: Never Used  . Tobacco comment: started smoking 2017 to current sometimes 1 daily or once a month  Substance Use Topics  . Alcohol use: No    Alcohol/week: 0.0 oz  . Drug use: No     Allergies   Adhesive [tape]; Aspirin; Ciprofloxacin; Latex; and Prednisone   Review of Systems Review of Systems  Constitutional: Negative for chills and fever.  HENT: Negative for congestion and rhinorrhea.   Eyes: Negative for redness and visual disturbance.  Respiratory: Negative for shortness of breath and wheezing.   Cardiovascular: Negative for chest pain and palpitations.  Gastrointestinal: Negative for abdominal pain, nausea and vomiting.  Genitourinary: Negative for dysuria and  urgency.  Musculoskeletal: Positive for back pain. Negative for arthralgias and myalgias.  Skin: Negative for pallor and wound.  Neurological: Negative for dizziness and headaches.     Physical Exam Updated Vital Signs BP 126/63 (BP Location: Right Arm)   Pulse (!) 106   Temp 97.7 F (36.5 C) (Oral)   Resp 20   Wt 56.2 kg (124 lb)   SpO2 94%   BMI 24.63 kg/m   Physical Exam  Constitutional: She is oriented to person, place, and time. She appears well-developed and well-nourished. No distress.  Chronically ill-appearing, muscle wasting to all 4 extremities  HENT:  Head: Normocephalic.  Small linear laceration to the right chin.  No intraoral trauma.  Eyes: Pupils are equal, round, and reactive to light. EOM are normal.  Neck: Normal range of motion. Neck supple.  Cardiovascular: Normal rate and regular rhythm. Exam reveals no gallop and no friction rub.  No murmur heard. Pulmonary/Chest: Effort normal. She has no wheezes. She has no rales.  Abdominal: Soft. She exhibits no distension. There is no tenderness.  Musculoskeletal: She exhibits no edema or tenderness.  Palpated from head to toe without any noted bony tenderness.  No midline spinal tenderness.  Neurological: She is alert and oriented to person, place, and time.  Skin: Skin is warm and dry. She is not diaphoretic.  Psychiatric: She has a normal mood and affect. Her behavior is normal.  Nursing note and vitals reviewed.    ED Treatments / Results  Labs (all labs ordered are listed, but only abnormal results are displayed) Labs Reviewed - No data to display  EKG EKG Interpretation  Date/Time:  Thursday September 14 2017 15:44:45 EDT Ventricular Rate:  111 PR Interval:    QRS Duration: 99 QT Interval:  360 QTC Calculation: 490 R Axis:   99 Text Interpretation:  Sinus tachycardia Right axis deviation Borderline repolarization abnormality Borderline prolonged QT interval No significant change since last tracing  Confirmed by Deno Etienne [phone]) on 09/14/2017 4:58:25 PM   Radiology Dg Thoracic Spine 2 View  Result Date: 09/14/2017 CLINICAL DATA:  Witnessed fall from wheelchair. History of confusion, baseline. Thoracic back pain. EXAM: THORACIC SPINE 2 VIEWS COMPARISON:  02/04/2017.  11/02/2016.  CT 10/24/2015 FINDINGS:  Old partial compression fractures of T4, T10 and T11 appear the same. No new fracture is seen. Posteromedial ribs appear negative. IMPRESSION: No acute finding. Old partial compression fractures of T4, T10 and T11. Electronically Signed   By: Nelson Chimes M.D.   On: 09/14/2017 16:37   Dg Lumbar Spine Complete  Result Date: 09/14/2017 CLINICAL DATA:  Witnessed fall from a wheelchair. EXAM: LUMBAR SPINE - COMPLETE 4+ VIEW COMPARISON:  CT 02/04/2017 FINDINGS: There is left-sided superior endplate depression at L4 not present on the study of August 2018. This could represent an acute superior endplate fracture. No other lumbar region fracture. Chronic lower lumbar degenerative disc disease and degenerative facet disease. Chronic aortic atherosclerosis. IMPRESSION: Newly seen depression of the superior endplate of L4 on the left, when compared to a CT study of 02/04/2017. This could represent an acute superior endplate fracture. Electronically Signed   By: Nelson Chimes M.D.   On: 09/14/2017 16:40   Ct Head Wo Contrast  Result Date: 09/14/2017 CLINICAL DATA:  Head trauma, alert and oriented EXAM: CT HEAD WITHOUT CONTRAST TECHNIQUE: Contiguous axial images were obtained from the base of the skull through the vertex without intravenous contrast. COMPARISON:  05/20/2017 FINDINGS: Brain: No evidence of acute infarction, hemorrhage, extra-axial collection, ventriculomegaly, or mass effect. Severe cerebral atrophy. Periventricular white matter low attenuation likely secondary to microangiopathy. Vascular: Cerebrovascular atherosclerotic calcifications are noted. Skull: Negative for fracture or focal lesion.  Sinuses/Orbits: Visualized portions of the orbits are unremarkable. Visualized portions of the paranasal sinuses and mastoid air cells are unremarkable. Other: None. IMPRESSION: No acute intracranial pathology. Electronically Signed   By: Kathreen Devoid   On: 09/14/2017 16:23    Procedures Procedures (including critical care time)  Medications Ordered in ED Medications - No data to display   Initial Impression / Assessment and Plan / ED Course  I have reviewed the triage vital signs and the nursing notes.  Pertinent labs & imaging results that were available during my care of the patient were reviewed by me and considered in my medical decision making (see chart for details).     67 yo F on hospice in for a fall out of her wheelchair.  Patient is altered and this is at her baseline.  Will obtain a CT of the head C-spine plain films of the back.  The patient is tachycardic and this appears to be her baseline over the past year or so when looking back at prior visits.  Will obtain an EKG to rule out arrhythmia.  EKG similar to prior.  Patient's x-ray with a possible acute superior endplate fracture of L4.  Will treat supportively.  Discharge home.  7:47 PM:  I have discussed the diagnosis/risks/treatment options with the patient and family and believe the pt to be eligible for discharge home to follow-up with PCP. We also discussed returning to the ED immediately if new or worsening sx occur. We discussed the sx which are most concerning (e.g., sudden worsening pain, fever, inability to tolerate by mouth) that necessitate immediate return. Medications administered to the patient during their visit and any new prescriptions provided to the patient are listed below.  Medications given during this visit Medications - No data to display   The patient appears reasonably screen and/or stabilized for discharge and I doubt any other medical condition or other [facility] requiring further screening,  evaluation, or treatment in the ED at this time prior to discharge.    Final Clinical Impressions(s) / ED Diagnoses  Final diagnoses:  Other closed fracture of fourth lumbar vertebra, initial encounter [facility])    ED Discharge Orders    None       Deno Etienne, DO 09/14/17 1947

## 2017-09-14 NOTE — Discharge Instructions (Signed)
You have a fracture to the superior part of your fourth lumbar vertebrae in you back.  This does not usually require surgery.  Follow up with your PCP.

## 2017-09-14 NOTE — ED Notes (Signed)
[facility]AR called for patient transfer back to [facility]. Patient requires 3LNC continuously and is non-ambulatory.

## 2017-09-20 ENCOUNTER — Emergency Department (HOSPITAL_COMMUNITY)

## 2017-09-20 ENCOUNTER — Emergency Department (HOSPITAL_COMMUNITY)
Admission: EM | Admit: 2017-09-20 | Discharge: 2017-09-20 | Disposition: A | Discharge status: Home / Self Care | Attending: Internal Medicine | Admitting: Internal Medicine

## 2017-09-20 ENCOUNTER — Encounter (HOSPITAL_COMMUNITY): Payer: Self-pay

## 2017-09-20 ENCOUNTER — Other Ambulatory Visit: Payer: Self-pay

## 2017-09-20 DIAGNOSIS — R51 Headache: Secondary | ICD-10-CM | POA: Diagnosis present

## 2017-09-20 DIAGNOSIS — Z79899 Other long term (current) drug therapy: Secondary | ICD-10-CM | POA: Diagnosis not present

## 2017-09-20 DIAGNOSIS — E119 Type 2 diabetes mellitus without complications: Secondary | ICD-10-CM | POA: Diagnosis not present

## 2017-09-20 DIAGNOSIS — J449 Chronic obstructive pulmonary disease, unspecified: Secondary | ICD-10-CM

## 2017-09-20 DIAGNOSIS — Z91040 Latex allergy status: Secondary | ICD-10-CM | POA: Insufficient documentation

## 2017-09-20 DIAGNOSIS — C662 Malignant neoplasm of left ureter: Secondary | ICD-10-CM | POA: Diagnosis not present

## 2017-09-20 DIAGNOSIS — F0390 Unspecified dementia without behavioral disturbance: Secondary | ICD-10-CM | POA: Diagnosis present

## 2017-09-20 DIAGNOSIS — Z794 Long term (current) use of insulin: Secondary | ICD-10-CM | POA: Insufficient documentation

## 2017-09-20 DIAGNOSIS — W19XXXA Unspecified fall, initial encounter: Secondary | ICD-10-CM

## 2017-09-20 DIAGNOSIS — E876 Hypokalemia: Secondary | ICD-10-CM

## 2017-09-20 DIAGNOSIS — R0602 Shortness of breath: Secondary | ICD-10-CM | POA: Insufficient documentation

## 2017-09-20 DIAGNOSIS — J9611 Chronic respiratory failure with hypoxia: Secondary | ICD-10-CM

## 2017-09-20 DIAGNOSIS — Y92009 Unspecified place in unspecified non-institutional (private) residence as the place of occurrence of the external cause: Secondary | ICD-10-CM

## 2017-09-20 DIAGNOSIS — Z87891 Personal history of nicotine dependence: Secondary | ICD-10-CM | POA: Insufficient documentation

## 2017-09-20 HISTORY — DX: Malignant neoplasm of overlapping sites of urinary organs: C68.8

## 2017-09-20 LAB — COMPREHENSIVE METABOLIC PANEL
ALBUMIN: 3.1 g/dL — AB (ref 3.5–5.0)
ALT: 11 U/L — ABNORMAL LOW (ref 14–54)
ANION GAP: 16 — AB (ref 5–15)
AST: 19 U/L (ref 15–41)
Alkaline Phosphatase: 120 U/L (ref 38–126)
BUN: 13 mg/dL (ref 6–20)
CO2: 33 mmol/L — AB (ref 22–32)
Calcium: 9.5 mg/dL (ref 8.9–10.3)
Chloride: 89 mmol/L — ABNORMAL LOW (ref 101–111)
Creatinine, Ser: 0.8 mg/dL (ref 0.44–1.00)
GFR calc Af Amer: >60 mL/min (ref >60)
GFR calc non Af Amer: >60 mL/min (ref >60)
GLUCOSE: 129 mg/dL — AB (ref 65–99)
POTASSIUM: 2.5 mmol/L — AB (ref 3.5–5.1)
Sodium: 138 mmol/L (ref 135–145)
TOTAL PROTEIN: 8 g/dL (ref 6.5–8.1)
Total Bilirubin: 0.2 mg/dL — ABNORMAL LOW (ref 0.3–1.2)

## 2017-09-20 LAB — CBC WITH DIFFERENTIAL/PLATELET
BASOS PCT: 0 %
Basophils Absolute: 0 10*3/uL (ref 0.0–0.1)
EOS ABS: 0.3 10*3/uL (ref 0.0–0.7)
Eosinophils Relative: 4 %
HCT: 41.2 % (ref 36.0–46.0)
Hemoglobin: 12.5 g/dL (ref 12.0–15.0)
Lymphocytes Relative: 14 %
Lymphs Abs: 1.1 10*3/uL (ref 0.7–4.0)
MCH: 25.2 pg — ABNORMAL LOW (ref 26.0–34.0)
MCHC: 30.3 g/dL (ref 30.0–36.0)
MCV: 83.1 fL (ref 78.0–100.0)
MONO ABS: 0.5 10*3/uL (ref 0.1–1.0)
MONOS PCT: 6 %
Neutro Abs: 6 10*3/uL (ref 1.7–7.7)
Neutrophils Relative %: 76 %
Platelets: 383 10*3/uL (ref 150–400)
RBC: 4.96 MIL/uL (ref 3.87–5.11)
RDW: 17 % — AB (ref 11.5–15.5)
WBC: 7.8 10*3/uL (ref 4.0–10.5)

## 2017-09-20 LAB — TROPONIN I: Troponin I: <0.03 ng/mL (ref <0.03)

## 2017-09-20 LAB — CK: Total CK: 46 U/L (ref 38–234)

## 2017-09-20 LAB — MAGNESIUM: MAGNESIUM: 1.8 mg/dL (ref 1.7–2.4)

## 2017-09-20 LAB — BRAIN NATRIURETIC PEPTIDE: B Natriuretic Peptide: 42.5 pg/mL (ref 0.0–100.0)

## 2017-09-20 MED ORDER — POTASSIUM CHLORIDE CRYS ER 20 MEQ PO TBCR
40.0000 meq | EXTENDED_RELEASE_TABLET | Freq: Once | ORAL | Status: AC
  Administered 2017-09-20: 40 meq via ORAL
  Filled 2017-09-20: qty 2

## 2017-09-20 MED ORDER — IPRATROPIUM-ALBUTEROL 0.5-2.5 (3) MG/3ML IN SOLN
3.0000 mL | Freq: Once | RESPIRATORY_TRACT | Status: AC
  Administered 2017-09-20: 3 mL via RESPIRATORY_TRACT
  Filled 2017-09-20: qty 3

## 2017-09-20 MED ORDER — LIDOCAINE 5 % EX PTCH
1.0000 | MEDICATED_PATCH | CUTANEOUS | Status: DC
  Administered 2017-09-20: 1 via TRANSDERMAL
  Filled 2017-09-20: qty 1

## 2017-09-20 MED ORDER — OXYCODONE-ACETAMINOPHEN 5-325 MG PO TABS
1.0000 | ORAL_TABLET | Freq: Once | ORAL | Status: AC
  Administered 2017-09-20: 1 via ORAL
  Filled 2017-09-20: qty 1

## 2017-09-20 MED ORDER — POTASSIUM CHLORIDE 10 MEQ/100ML IV SOLN
10.0000 meq | INTRAVENOUS | Status: AC
  Administered 2017-09-20 (×3): 10 meq via INTRAVENOUS
  Filled 2017-09-20 (×3): qty 100

## 2017-09-20 NOTE — ED Notes (Signed)
ED Provider at bedside. 

## 2017-09-20 NOTE — ED Provider Notes (Signed)
8:30 AM  D/w hospitalist- she has talked with family and patient is DNR on comfort care only.  Has received some potassium repletion in the ED.  She is also going to order a lidocaine patch to help with her back pain.  She will be discharged back to her facility after potassium finishes.     Pixie Casino, MD 09/20/17 [phone]

## 2017-09-20 NOTE — Discharge Instructions (Signed)
Return to the ED with any concerns including uncontrolled pain or any other concerns.    We are stopping your lasix today as this is likely causing hypokalemia.

## 2017-09-20 NOTE — Consult Note (Signed)
ER Consultation   Traci Thomas MWU:[id number] DOB: 10-03-1950 DOA: 09/20/2017  PCP: System, Pcp Not In Patient coming from: [facility]; NOK: Son, [phone].  Also enrolled in [facility] [phone].  Chief Complaint:  Fall, SOB  HPI: Traci Thomas is a 67 y.o. 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  The patient is alert, cantankerous, and oriented only to person.  She reports that "you are not my doctor" and she is uncertain where she is or why she is here.  She does report diffuse pain which is her usual state but also back pain that is different from her norm.  I spoke with Bradly Bienenstock from [facility] and [place].  She received a call from the facility at about 3am after a fall.  She recommends calling her son to discuss goals of care.  Care plan reports that care plan is to focus on comfort and pain. Hospice will follow up with her today if she returns to the facility.    I called and spoke with the son.  His son is just to keep her comfortable.  She is on Hospice.  She does have memory issues - "it's according to the day", varies between "fine and there's day where she babbles."     ED Course: Patient enrolled in Hospice for various problems that do not appear to be contributing to today's visit.  Recent compression fracture.  Found to have hypokalemia with EKG changes.  Review of Systems: Unable to perform effectively   PMH, PSH, SH, and FH reviewed in Epic   Past Medical History:  Diagnosis Date  . Asthma   . Back pain   . COPD (chronic obstructive pulmonary disease) (HCC)    Home O2 3lpm, theophylline  . Diabetes mellitus without complication ([place])   . History of radiation therapy 02/16/17-03/09/17   pelvis left 2.5 Gy in 14 fractions  . 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 APPENDECTOMY    . bladder repair  1997   [facility] - took muscle from her left leg to replace her bladder muscle  . FEMUR IM NAIL Right 10/21/2015   Procedure: INTRAMEDULLARY (IM) NAIL FEMORAL;  Surgeon: Meredith Pel, MD;  Location: [facility];  Service: Orthopedics;  Laterality: Right;  . TONSILLECTOMY      Social History   Socioeconomic History  . Marital status: Single    Spouse name: Not on file  . Number of children: Not on file  . Years of education: Not on file  . Highest education level: Not on file  Occupational History  . Not on file  Social Needs  . Financial resource strain: Not on file  . Food insecurity:    Worry: Not on file    Inability: Not on file  . Transportation needs:    Medical: Not on file    Non-medical: Not on file  Tobacco Use  . Smoking status: Former Smoker    Packs/day: 1.00    Years: 50.00    Pack years: 50.00    Types: Cigarettes    Last attempt to quit: 2016    Years since quitting: 3.2  . Smokeless tobacco: Never Used  . Tobacco comment: started smoking 2017 to current sometimes 1 daily or once a  month  Substance and Sexual Activity  . Alcohol use: No    Alcohol/week: 0.0 oz  . Drug use: No  . Sexual activity: Never  Lifestyle  . Physical activity:    Days per week: Not on file    Minutes per session: Not on file  . Stress: Not on file  Relationships  . Social connections:    Talks on phone: Not on file    Gets together: Not on file    Attends religious service: Not on file    Active member of club or organization: Not on file    Attends meetings of clubs or organizations: Not on file    Relationship status: Not on file  . Intimate partner violence:    Fear of current or ex partner: Not on file    Emotionally abused: Not on file    Physically abused: Not on file    Forced sexual activity: Not on file  Other Topics Concern  . Not on file  Social History  Narrative  . Not on file    Allergies  Allergen Reactions  . Adhesive [Tape] Other (See Comments)    Skin peels off   . Aspirin Nausea And Vomiting  . Ciprofloxacin     Unknown reaction   . Latex     Unknown reaction   . Prednisone Nausea And Vomiting    Family History  Problem Relation Age of Onset  . COPD Mother   . Skin cancer Mother   . Diabetes Unknown   . Colon cancer Sister   . Testicular cancer Brother     Prior to Admission medications   Medication Sig Start Date End Date Taking? Authorizing Provider  albuterol (PROVENTIL HFA;VENTOLIN HFA) 108 (90 Base) MCG/ACT inhaler Inhale 2 puffs into the lungs every 6 (six) hours as needed for wheezing or shortness of breath.   Yes [provider]  albuterol (PROVENTIL) (2.5 MG/3ML) 0.083% nebulizer solution Take 3 mLs (2.5 mg total) by nebulization every 4 (four) hours as needed for wheezing or shortness of breath. 10/17/16  Yes Arrien, Jimmy Picket, MD  ALPRAZolam Duanne Moron) 0.5 MG tablet Take 1 tablet (0.5 mg total) by mouth 3 (three) times daily as needed for anxiety. Patient taking differently: Take 1 mg by mouth 3 (three) times daily.  01/17/17  Yes Patrecia Pour, MD  desvenlafaxine (PRISTIQ) 100 MG 24 hr tablet Take 100 mg by mouth daily.   Yes [provider]  docusate sodium (COLACE) 100 MG capsule Take 100 mg by mouth 2 (two) times daily.    Yes [provider]  fluticasone (FLONASE) 50 MCG/ACT nasal spray Place 2 sprays into both nostrils daily.   Yes [provider]  fluticasone furoate-vilanterol (BREO ELLIPTA) 200-25 MCG/INH AEPB Inhale 1 puff into the lungs daily after breakfast.    Yes [provider]  furosemide (LASIX) 20 MG tablet Take 20 mg by mouth daily.   Yes [provider]  gabapentin (NEURONTIN) 100 MG capsule Take 200 mg by mouth 4 (four) times daily.    Yes [provider]  HYDROcodone-acetaminophen (NORCO) 10-325 MG tablet Take 1 tablet by  mouth every 6 (six) hours as needed for moderate pain.   Yes [provider]  hydroxypropyl methylcellulose / hypromellose (ISOPTO TEARS / GONIOVISC) 2.5 % ophthalmic solution Place 1 drop into both eyes every 12 (twelve) hours as needed for dry eyes.   Yes [provider]  insulin lispro (HUMALOG) 100 UNIT/ML injection Inject 0-10  Units into the skin 3 (three) times daily before meals. Per sliding scale: if blood sugars is 150-200 give 2 units, 201-250 give 4 units, 251-300 give 6 units, 301-350 give 8 units, 351-800 give 10 units subcutaneous before meals    Yes [provider]  ipratropium-albuterol (DUONEB) 0.5-2.5 (3) MG/3ML SOLN Take 3 mLs by nebulization every 6 (six) hours.    Yes [provider]  lactulose (CHRONULAC) 10 GM/15ML solution Take 10 g by mouth 2 (two) times daily as needed for mild constipation.   Yes [provider]  levocetirizine (XYZAL) 5 MG tablet Take 5 mg by mouth every evening.   Yes [provider]  magnesium hydroxide (MILK OF MAGNESIA) 400 MG/5ML suspension Take 30 mLs by mouth daily as needed for mild constipation.    Yes [provider]  methadone (DOLOPHINE) 5 MG tablet Take 5 mg by mouth 3 (three) times daily.    Yes [provider]  omeprazole (PRILOSEC) 20 MG capsule Take 20 mg by mouth daily before breakfast.   Yes [provider]  oxybutynin (DITROPAN-XL) 5 MG 24 hr tablet Take 5 mg by mouth daily with breakfast.   Yes [provider]  OXYGEN Inhale 6 L/min into the lungs continuous.   Yes [provider]  polyethylene glycol (MIRALAX / GLYCOLAX) packet Take 17 g by mouth daily. 02/04/17  Yes Sherwood Gambler, MD  prochlorperazine (COMPAZINE) 10 MG tablet Take 10 mg by mouth every 4 (four) hours as needed for nausea or vomiting.   Yes [provider]  QUEtiapine (SEROQUEL) 300 MG tablet Take 300 mg by mouth at bedtime.   Yes [provider]   QUEtiapine (SEROQUEL) 50 MG tablet Take 50 mg by mouth daily with breakfast.   Yes [provider]  senna-docusate (SENOKOT-S) 8.6-50 MG tablet Take 2 tablets by mouth 2 (two) times daily as needed for mild constipation. Patient taking differently: Take 2 tablets by mouth daily.  01/17/17  Yes Patrecia Pour, MD  theophylline (THEODUR) 300 MG 12 hr tablet Take 300 mg by mouth 2 (two) times daily.   Yes [provider]  tiotropium (SPIRIVA HANDIHALER) 18 MCG inhalation capsule Place 1 capsule (18 mcg total) into inhaler and inhale daily. 03/31/15  Yes Short, Noah Delaine, MD  Vitamin D, Ergocalciferol, (DRISDOL) 50000 units CAPS capsule Take 50,000 Units by mouth every Sunday.    Yes [provider]    Physical Exam: Vitals:   09/20/17 0715 09/20/17 0730 09/20/17 0800 09/20/17 0830  BP: 131/87 124/90 132/87 135/73  Pulse: (!) 109 (!) 110 (!) 111 (!) 102  Resp: (!) 22 20 20 20   Temp:      TempSrc:      SpO2: 98% 96% 98% 94%  Weight:         General:  Appears calm and comfortable and is NAD Eyes:  PERRL, EOMI, normal lids, iris ENT:  grossly normal hearing, lips & tongue, mmm Neck:  no LAD, masses or thyromegaly Cardiovascular:  Tachycardia, no m/r/g. No LE edema.  Respiratory: Diffuse coarse breath sounds with intermittent wheezes.  Normal to slightly increased respiratory effort on [place] O2. Abdomen:  soft, mildly diffusely tender, ND, NABS Skin:  no rash or induration seen on limited exam Musculoskeletal:  grossly normal tone BUE/BLE, good ROM, no bony abnormality Psychiatric: cantankerous mood and affect, speech fluent and appropriate, AOx1 Neurologic: unable to effectively perform    Radiological Exams on Admission: Dg Chest 2 View  Result Date: 09/20/2017  CLINICAL DATA:  Shortness of breath.  Fell today. EXAM: CHEST - 2 VIEW COMPARISON:  02/04/2017 FINDINGS: Heart size and pulmonary vascularity are normal. Interstitial pattern to the lung bases is  increased since previous study suggesting edema or interstitial pneumonitis. There is probably some underlying fibrosis as well. Emphysematous changes in the upper lungs. Scattered calcified granulomas. No airspace disease or consolidation. No blunting of costophrenic angles. No pneumothorax. Calcification of the aorta. IMPRESSION: Increased interstitial pattern to the lung bases suggesting developing edema or interstitial pneumonitis. Underlying emphysematous changes and fibrosis. Electronically Signed   By: Lucienne Capers M.D.   On: 09/20/2017 05:37   Dg Pelvis 1-2 Views  Result Date: 09/20/2017 CLINICAL DATA:  Left-sided pain after a fall today. EXAM: PELVIS - 1-2 VIEW COMPARISON:  05/20/2017 FINDINGS: Postoperative internal fixation of the right hip. Degenerative changes in both hips. No acute fractures identified in the pelvis or hips. Vascular calcifications. Calcified phleboliths. IMPRESSION: No acute bony abnormalities. Degenerative changes in the hips. Postoperative change in the right hip. Electronically Signed   By: Lucienne Capers M.D.   On: 09/20/2017 05:38   Ct Head Wo Contrast  Result Date: 09/20/2017 CLINICAL DATA:  Unwitnessed fall. T4 thoracic spine fracture last Thursday. Patient struck head. EXAM: CT HEAD WITHOUT CONTRAST CT CERVICAL SPINE WITHOUT CONTRAST TECHNIQUE: Multidetector CT imaging of the head and cervical spine was performed following the standard protocol without intravenous contrast. Multiplanar CT image reconstructions of the cervical spine were also generated. COMPARISON:  CT head 09/14/2017. CT head and cervical spine 03/25/2015 FINDINGS: CT HEAD FINDINGS Brain: Diffuse cerebral atrophy. Ventricular dilatation consistent with central atrophy. Low-attenuation changes in the deep white matter consistent with small vessel ischemia. No mass or midline shift. No abnormal extra-axial fluid collections. Gray-white matter junctions are distinct. Basal cisterns are not effaced. No  acute intracranial hemorrhage. Vascular: Mild vascular calcifications. Skull: Normal. Negative for fracture or focal lesion. Sinuses/Orbits: Paranasal sinuses are clear. Opacification of bilateral mastoid air cells. Other: None. CT CERVICAL SPINE FINDINGS: Examination is limited by motion artifact. Alignment: Normal alignment of the cervical vertebrae and facet joints. C1-2 articulation appears intact. Skull base and vertebrae: No vertebral compression. No focal bone lesion or bone destruction. Soft tissues and spinal canal: No prevertebral soft tissue swelling. No paraspinal soft tissue mass or infiltration. Disc levels: Mild degenerative changes at C4-5 and C5-6 levels and C6-7 level. Upper chest: Prominent emphysematous changes in the lung apices. Other: None. IMPRESSION: 1. No acute intracranial abnormalities. 2. Chronic atrophy and small vessel ischemic changes. 3. Normal alignment of the cervical spine. 4. No acute displaced fractures identified. 5. Mild degenerative changes in the cervical spine. 6. Bilateral mastoid effusions. 7. Emphysematous changes in the lung apices. Electronically Signed   By: Lucienne Capers M.D.   On: 09/20/2017 06:05   Ct Cervical Spine Wo Contrast  Result Date: 09/20/2017 CLINICAL DATA:  Unwitnessed fall. T4 thoracic spine fracture last Thursday. Patient struck head. EXAM: CT HEAD WITHOUT CONTRAST CT CERVICAL SPINE WITHOUT CONTRAST TECHNIQUE: Multidetector CT imaging of the head and cervical spine was performed following the standard protocol without intravenous contrast. Multiplanar CT image reconstructions of the cervical spine were also generated. COMPARISON:  CT head 09/14/2017. CT head and cervical spine 03/25/2015 FINDINGS: CT HEAD FINDINGS Brain: Diffuse cerebral atrophy. Ventricular dilatation consistent with central atrophy. Low-attenuation changes in the deep white matter consistent with small vessel ischemia. No mass or midline shift. No abnormal extra-axial fluid  collections. Gray-white matter junctions are distinct. Basal  cisterns are not effaced. No acute intracranial hemorrhage. Vascular: Mild vascular calcifications. Skull: Normal. Negative for fracture or focal lesion. Sinuses/Orbits: Paranasal sinuses are clear. Opacification of bilateral mastoid air cells. Other: None. CT CERVICAL SPINE FINDINGS: Examination is limited by motion artifact. Alignment: Normal alignment of the cervical vertebrae and facet joints. C1-2 articulation appears intact. Skull base and vertebrae: No vertebral compression. No focal bone lesion or bone destruction. Soft tissues and spinal canal: No prevertebral soft tissue swelling. No paraspinal soft tissue mass or infiltration. Disc levels: Mild degenerative changes at C4-5 and C5-6 levels and C6-7 level. Upper chest: Prominent emphysematous changes in the lung apices. Other: None. IMPRESSION: 1. No acute intracranial abnormalities. 2. Chronic atrophy and small vessel ischemic changes. 3. Normal alignment of the cervical spine. 4. No acute displaced fractures identified. 5. Mild degenerative changes in the cervical spine. 6. Bilateral mastoid effusions. 7. Emphysematous changes in the lung apices. Electronically Signed   By: Lucienne Capers M.D.   On: 09/20/2017 06:05   Dg Shoulder Left  Result Date: 09/20/2017 CLINICAL DATA:  Left-sided pain after a fall. EXAM: LEFT SHOULDER - 2+ VIEW COMPARISON:  03/30/2012 FINDINGS: There is no evidence of fracture or dislocation. There is no evidence of arthropathy or other focal bone abnormality. Soft tissues are unremarkable. IMPRESSION: Negative. Electronically Signed   By: Lucienne Capers M.D.   On: 09/20/2017 05:38    EKG: Independently reviewed.  NSR with rate 110; nonspecific ST changes with no evidence of acute ischemia   Labs on Admission: I have personally reviewed the available labs and imaging studies at the time of the admission.  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)   Hypokalemia   Fall at home, initial encounter   -Hospice patient with plan for comfort measures primarily -Presenting from facility after a fall -Prior ER visit 3/28 for fall showed new L4 compression fracture -Her cancer and severe COPD appear to be at baseline -She was found to have marked hypokalemia while in the ER and so [facility] was consulted for admission -After discussion with Hospice and the son, with goals for comfort measures, it is reasonable to discharge the patient back to SNF -Hypokalemia is likely related to Lasix; would suggest discontinuation -K+ repleted with PO and IV K+ -Normal magnesium -Suggest resumption of home PO medications -Will add Lidocaine patch for new compression fracture and back pain -Hospice to see this afternoon at her facility   Thank you for this consultation.  Karmen Bongo MD [facility]  If note is complete, please contact covering daytime or nighttime physician. [email or web address] Password [email or web address]  09/20/2017, 8:50 AM

## 2017-09-20 NOTE — ED Triage Notes (Addendum)
Per [facility], pt arriving from [facility] for an unwitnessed fall. Staff reports 15 minutes downtime. Pt takes off oxygen to go to the bathroom and pt reports falling when she was going to the bathroom. Pt found to be 70% on RA when fire arrived. Pt went to 84% on [place] and then 99% with the duoneb. Hx of COPD. Pt at [facility] for a T4 fracture that happened last Thursday. Pt complaining of lumbar, right shoulder and left knee pain. EMS reports she coughed up brown sputum. Pt reports she hit her head but is not on blood thinners. Pt unsure of where pain is. Pt will state I'm not in pain and then state she hurts all over, in abdomen, left shoulder, back and head. Pt poor historian.

## 2017-09-20 NOTE — ED Provider Notes (Signed)
[facility]PART[facility] Provider Note   CSN: [id number] Arrival date & time: 09/20/17  0431     History   Chief Complaint Chief Complaint  Patient presents with  . Fall  . Shortness of Breath    HPI Traci Thomas is a 67 y.o. female.  Patient from living facility with unwitnessed fall.  Patient was apparently trying to get to the bathroom without her oxygen and fell.  She states she "yells for my son" but nobody came.  She lives in an assisted living. fall was not witnessed.  Staff reports patient was on the ground for 15 minutes.  Found to have oxygen saturations 70% on room air at facility.  This improved with her nasal cannula oxygen to 90s. Patient complains of headache but is not sure where she hit her head.  She hurts in her left shoulder, back, abdomen, left hip, back and head.  She is a poor historian. Patient was seen in the ED 5 days ago and diagnosed with a new L4 compression fracture.  She is apparently wheelchair-bound at baseline.  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  The history is provided by the patient and the EMS personnel. The history is limited by the condition of the patient.  Fall  Associated symptoms include abdominal pain, headaches and shortness of breath. Pertinent negatives include no chest pain.  Shortness of Breath  Associated symptoms include headaches, vomiting and abdominal pain. Pertinent negatives include no fever and no chest pain.    Past Medical History:  Diagnosis Date  . Asthma   . Back pain   . COPD (chronic obstructive pulmonary disease) (HCC)    Home O2 3lpm, theophylline  . Diabetes mellitus without complication ([place])   . History of radiation therapy 02/16/17-03/09/17   pelvis left 2.5 Gy in 14 fractions    Patient Active Problem List   Diagnosis Date Noted  . 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 Pelvic mass   . Palliative care by specialist   . HCAP (healthcare-associated pneumonia)   . Gross hematuria 01/11/2017  . Hydronephrosis of left kidney 01/11/2017  . Acute respiratory failure with hypoxia and hypercapnia ([place]) 01/03/2017  . Acute respiratory acidosis 01/03/2017  . Normocytic anemia 01/03/2017  . Chronic respiratory failure with hypoxia ([place]) 11/30/2016  . Constipation 11/02/2016  . Lobar pneumonia ([place]) 10/04/2016  . Acute metabolic encephalopathy 67/89/3810  . Abdominal pain   . Sepsis ([place])   . Hip fracture ([place]) 10/21/2015  . Stage 4 very severe COPD by GOLD classification ([employer]) 10/21/2015  . Anemia 10/21/2015  . Dementia 10/21/2015  . Goals of care, counseling/discussion 10/21/2015    Class: Acute  . Closed right hip fracture ([place])   . Encounter for palliative care   . COPD exacerbation ([place]) 03/25/2015  . Diabetes mellitus type 2 in nonobese ([place]) 03/25/2015  . Depression 03/25/2015    Past Surgical History:  Procedure Laterality Date  . APPENDECTOMY    . bladder repair  1997   [facility] - took muscle from her left leg to replace her bladder muscle  . FEMUR IM NAIL Right 10/21/2015   Procedure: INTRAMEDULLARY (IM) NAIL FEMORAL;  Surgeon: Meredith Pel, MD;  Location: [facility];  Service: Orthopedics;  Laterality: Right;  . TONSILLECTOMY       OB History   None  Home Medications    Prior to Admission medications   Medication Sig Start Date End Date Taking? Authorizing Provider  albuterol (PROVENTIL) (2.5 MG/3ML) 0.083% nebulizer solution Take 3 mLs (2.5 mg total) by nebulization every 4 (four) hours as needed for wheezing or shortness of breath. 10/17/16   Arrien, Jimmy Picket, MD  ALPRAZolam Duanne Moron) 0.5 MG tablet Take 1 tablet (0.5 mg total) by mouth 3 (three) times daily as needed for anxiety. Patient taking differently: Take 1 mg by mouth 3 (three) times daily.  01/17/17   Patrecia Pour, MD  docusate sodium (COLACE)  100 MG capsule Take 100 mg by mouth 2 (two) times daily.     [provider]  fluticasone (FLONASE) 50 MCG/ACT nasal spray Place 2 sprays into both nostrils daily.    [provider]  fluticasone furoate-vilanterol (BREO ELLIPTA) 200-25 MCG/INH AEPB Inhale 1 puff into the lungs daily after breakfast.     [provider]  furosemide (LASIX) 20 MG tablet Take 20 mg by mouth daily.    [provider]  gabapentin (NEURONTIN) 100 MG capsule Take 200 mg by mouth 4 (four) times daily.     [provider]  hydroxypropyl methylcellulose / hypromellose (ISOPTO TEARS / GONIOVISC) 2.5 % ophthalmic solution Place 1 drop into both eyes every 12 (twelve) hours as needed for dry eyes.    [provider]  insulin lispro (HUMALOG) 100 UNIT/ML injection Inject 0-10 Units into the skin 3 (three) times daily before meals. Per sliding scale: if blood sugars is 150-200 give 2 units, 201-250 give 4 units, 251-300 give 6 units, 301-350 give 8 units, 351-800 give 10 units subcutaneous before meals     [provider]  ipratropium-albuterol (DUONEB) 0.5-2.5 (3) MG/3ML SOLN Take 3 mLs by nebulization every 6 (six) hours.     [provider]  levocetirizine (XYZAL) 5 MG tablet Take 5 mg by mouth every evening.    [provider]  magnesium hydroxide (MILK OF MAGNESIA) 400 MG/5ML suspension Take 30 mLs by mouth daily as needed for mild constipation.     [provider]  methadone (DOLOPHINE) 5 MG tablet Take 5 mg by mouth 3 (three) times daily.     [provider]  omeprazole (PRILOSEC) 20 MG capsule Take 20 mg by mouth daily before breakfast.    [provider]  oxybutynin (DITROPAN-XL) 5 MG 24 hr tablet Take 5 mg by mouth daily with breakfast.    [provider]  OXYGEN Inhale 6 L/min into the lungs continuous.    [provider]  polyethylene glycol (MIRALAX / GLYCOLAX) packet Take 17 g by mouth daily.  02/04/17   Sherwood Gambler, MD  prochlorperazine (COMPAZINE) 10 MG tablet Take 10 mg by mouth every 4 (four) hours as needed for nausea or vomiting.    [provider]  QUEtiapine (SEROQUEL) 300 MG tablet Take 300 mg by mouth at bedtime.    [provider]  QUEtiapine (SEROQUEL) 50 MG tablet Take 50 mg by mouth daily with breakfast.    [provider]  senna-docusate (SENOKOT-S) 8.6-50 MG tablet Take 2 tablets by mouth 2 (two) times daily as needed for mild constipation. Patient taking differently: Take 2 tablets by mouth daily.  01/17/17   Patrecia Pour, MD  theophylline (THEODUR) 300 MG 12 hr tablet Take 300 mg by mouth 2 (two) times daily.    [provider]  tiotropium (SPIRIVA HANDIHALER) 18 MCG inhalation capsule Place 1 capsule (  18 mcg total) into inhaler and inhale daily. 03/31/15   Janece Canterbury, MD  Vitamin D, Ergocalciferol, (DRISDOL) 50000 units CAPS capsule Take 50,000 Units by mouth every Sunday.     [provider]    Family History Family History  Problem Relation Age of Onset  . COPD Mother   . Skin cancer Mother   . Diabetes Unknown   . Colon cancer Sister   . Testicular cancer Brother     Social History Social History   Tobacco Use  . Smoking status: Former Smoker    Packs/day: 1.00    Years: 50.00    Pack years: 50.00    Types: Cigarettes    Last attempt to quit: 2016    Years since quitting: 3.2  . Smokeless tobacco: Never Used  . Tobacco comment: started smoking 2017 to current sometimes 1 daily or once a month  Substance Use Topics  . Alcohol use: No    Alcohol/week: 0.0 oz  . Drug use: No     Allergies   Adhesive [tape]; Aspirin; Ciprofloxacin; Latex; and Prednisone   Review of Systems Review of Systems  Constitutional: Positive for fatigue. Negative for fever.  HENT: Negative for congestion.   Respiratory: Positive for shortness of breath.   Cardiovascular: Negative for chest pain.    Gastrointestinal: Positive for abdominal pain, nausea and vomiting.  Genitourinary: Negative for dysuria.  Musculoskeletal: Positive for arthralgias, back pain and myalgias.  Neurological: Positive for weakness and headaches.     all other systems are negative except as noted in the HPI and PMH.   Physical Exam Updated Vital Signs BP 124/60 (BP Location: Right Arm)   Pulse (!) 109   Temp 98.8 F (37.1 C) (Oral)   Resp 18   Wt 56.2 kg (124 lb)   SpO2 100%   BMI 24.63 kg/m   Physical Exam  Constitutional: She is oriented to person, place, and time. She appears well-developed and well-nourished. No distress.  Chronically ill-appearing Dry mucous membranes Angry mood, intermittent confusion.  HENT:  Head: Normocephalic and atraumatic.  Mouth/Throat: Oropharynx is clear and moist. No oropharyngeal exudate.  Eyes: Pupils are equal, round, and reactive to light. Conjunctivae and EOM are normal.  Neck: Normal range of motion. Neck supple.  No meningismus.  Cardiovascular: Normal rate, regular rhythm, normal heart sounds and intact distal pulses.  No murmur heard. Pulmonary/Chest: Effort normal. No respiratory distress. She has wheezes.  Scattered expiratory wheezing.   Abdominal: Soft. There is tenderness. There is no rebound and no guarding.  Diffuse tenderness without guarding or rebound.   Musculoskeletal: Normal range of motion. She exhibits tenderness. She exhibits no edema.  Diffuse tenderness to palpation to thoracic and lumbar spine.  Pain with ROM bilateral hips  Neurological: She is alert and oriented to person, place, and time. No cranial nerve deficit. She exhibits normal muscle tone. Coordination normal.  Moving all extremities, CN 2-12 intact.  Skin: Skin is warm. Capillary refill takes less than 2 seconds. No rash noted.  Psychiatric: She has a normal mood and affect. Her behavior is normal.  Nursing note and vitals reviewed.    ED Treatments / Results   Labs (all labs ordered are listed, but only abnormal results are displayed) Labs Reviewed  CBC WITH DIFFERENTIAL/PLATELET - Abnormal; Notable for the following components:      Result Value   MCH 25.2 (*)    RDW 17.0 (*)    All other components within normal limits  COMPREHENSIVE  METABOLIC PANEL - Abnormal; Notable for the following components:   Potassium 2.5 (*)    Chloride 89 (*)    CO2 33 (*)    Glucose, Bld 129 (*)    Albumin 3.1 (*)    ALT 11 (*)    Total Bilirubin 0.2 (*)    Anion gap 16 (*)    All other components within normal limits  BRAIN NATRIURETIC PEPTIDE  TROPONIN I  CK  MAGNESIUM    EKG EKG Interpretation  Date/Time:  Wednesday September 20 2017 04:41:15 EDT Ventricular Rate:  110 PR Interval:    QRS Duration: 103 QT Interval:  356 QTC Calculation: 482 R Axis:   92 Text Interpretation:  Sinus tachycardia Left posterior fascicular block Abnormal R-wave progression, late transition Borderline repolarization abnormality Baseline wander in lead(s) V1 No significant change was found Confirmed by Ezequiel Essex [id number]) on 09/20/2017 4:51:23 AM   Radiology Dg Chest 2 View  Result Date: 09/20/2017 CLINICAL DATA:  Shortness of breath.  Fell today. EXAM: CHEST - 2 VIEW COMPARISON:  02/04/2017 FINDINGS: Heart size and pulmonary vascularity are normal. Interstitial pattern to the lung bases is increased since previous study suggesting edema or interstitial pneumonitis. There is probably some underlying fibrosis as well. Emphysematous changes in the upper lungs. Scattered calcified granulomas. No airspace disease or consolidation. No blunting of costophrenic angles. No pneumothorax. Calcification of the aorta. IMPRESSION: Increased interstitial pattern to the lung bases suggesting developing edema or interstitial pneumonitis. Underlying emphysematous changes and fibrosis. Electronically Signed   By: Lucienne Capers M.D.   On: 09/20/2017 05:37   Dg Pelvis 1-2 Views  Result  Date: 09/20/2017 CLINICAL DATA:  Left-sided pain after a fall today. EXAM: PELVIS - 1-2 VIEW COMPARISON:  05/20/2017 FINDINGS: Postoperative internal fixation of the right hip. Degenerative changes in both hips. No acute fractures identified in the pelvis or hips. Vascular calcifications. Calcified phleboliths. IMPRESSION: No acute bony abnormalities. Degenerative changes in the hips. Postoperative change in the right hip. Electronically Signed   By: Lucienne Capers M.D.   On: 09/20/2017 05:38   Ct Head Wo Contrast  Result Date: 09/20/2017 CLINICAL DATA:  Unwitnessed fall. T4 thoracic spine fracture last Thursday. Patient struck head. EXAM: CT HEAD WITHOUT CONTRAST CT CERVICAL SPINE WITHOUT CONTRAST TECHNIQUE: Multidetector CT imaging of the head and cervical spine was performed following the standard protocol without intravenous contrast. Multiplanar CT image reconstructions of the cervical spine were also generated. COMPARISON:  CT head 09/14/2017. CT head and cervical spine 03/25/2015 FINDINGS: CT HEAD FINDINGS Brain: Diffuse cerebral atrophy. Ventricular dilatation consistent with central atrophy. Low-attenuation changes in the deep white matter consistent with small vessel ischemia. No mass or midline shift. No abnormal extra-axial fluid collections. Gray-white matter junctions are distinct. Basal cisterns are not effaced. No acute intracranial hemorrhage. Vascular: Mild vascular calcifications. Skull: Normal. Negative for fracture or focal lesion. Sinuses/Orbits: Paranasal sinuses are clear. Opacification of bilateral mastoid air cells. Other: None. CT CERVICAL SPINE FINDINGS: Examination is limited by motion artifact. Alignment: Normal alignment of the cervical vertebrae and facet joints. C1-2 articulation appears intact. Skull base and vertebrae: No vertebral compression. No focal bone lesion or bone destruction. Soft tissues and spinal canal: No prevertebral soft tissue swelling. No paraspinal soft  tissue mass or infiltration. Disc levels: Mild degenerative changes at C4-5 and C5-6 levels and C6-7 level. Upper chest: Prominent emphysematous changes in the lung apices. Other: None. IMPRESSION: 1. No acute intracranial abnormalities. 2. Chronic atrophy and small vessel ischemic changes.  3. Normal alignment of the cervical spine. 4. No acute displaced fractures identified. 5. Mild degenerative changes in the cervical spine. 6. Bilateral mastoid effusions. 7. Emphysematous changes in the lung apices. Electronically Signed   By: Lucienne Capers M.D.   On: 09/20/2017 06:05   Ct Cervical Spine Wo Contrast  Result Date: 09/20/2017 CLINICAL DATA:  Unwitnessed fall. T4 thoracic spine fracture last Thursday. Patient struck head. EXAM: CT HEAD WITHOUT CONTRAST CT CERVICAL SPINE WITHOUT CONTRAST TECHNIQUE: Multidetector CT imaging of the head and cervical spine was performed following the standard protocol without intravenous contrast. Multiplanar CT image reconstructions of the cervical spine were also generated. COMPARISON:  CT head 09/14/2017. CT head and cervical spine 03/25/2015 FINDINGS: CT HEAD FINDINGS Brain: Diffuse cerebral atrophy. Ventricular dilatation consistent with central atrophy. Low-attenuation changes in the deep white matter consistent with small vessel ischemia. No mass or midline shift. No abnormal extra-axial fluid collections. Gray-white matter junctions are distinct. Basal cisterns are not effaced. No acute intracranial hemorrhage. Vascular: Mild vascular calcifications. Skull: Normal. Negative for fracture or focal lesion. Sinuses/Orbits: Paranasal sinuses are clear. Opacification of bilateral mastoid air cells. Other: None. CT CERVICAL SPINE FINDINGS: Examination is limited by motion artifact. Alignment: Normal alignment of the cervical vertebrae and facet joints. C1-2 articulation appears intact. Skull base and vertebrae: No vertebral compression. No focal bone lesion or bone destruction.  Soft tissues and spinal canal: No prevertebral soft tissue swelling. No paraspinal soft tissue mass or infiltration. Disc levels: Mild degenerative changes at C4-5 and C5-6 levels and C6-7 level. Upper chest: Prominent emphysematous changes in the lung apices. Other: None. IMPRESSION: 1. No acute intracranial abnormalities. 2. Chronic atrophy and small vessel ischemic changes. 3. Normal alignment of the cervical spine. 4. No acute displaced fractures identified. 5. Mild degenerative changes in the cervical spine. 6. Bilateral mastoid effusions. 7. Emphysematous changes in the lung apices. Electronically Signed   By: Lucienne Capers M.D.   On: 09/20/2017 06:05   Dg Shoulder Left  Result Date: 09/20/2017 CLINICAL DATA:  Left-sided pain after a fall. EXAM: LEFT SHOULDER - 2+ VIEW COMPARISON:  03/30/2012 FINDINGS: There is no evidence of fracture or dislocation. There is no evidence of arthropathy or other focal bone abnormality. Soft tissues are unremarkable. IMPRESSION: Negative. Electronically Signed   By: Lucienne Capers M.D.   On: 09/20/2017 05:38    Procedures Procedures (including critical care time)  Medications Ordered in ED Medications - No data to display   Initial Impression / Assessment and Plan / ED Course  I have reviewed the triage vital signs and the nursing notes.  Pertinent labs & imaging results that were available during my care of the patient were reviewed by me and considered in my medical decision making (see chart for details).    Hospice patient on home O2 with apparent mechanical fall. Confusion at baseline. Possible LOC.  Nonspecific T wave changes on EKG. Traumatic imaging negative. Known L4 compression fracture. CT head and C spine negative.  Breathing appears to be at baseline. Neb given.CXR unchanged. Labs with hypokalemia 2.5. Normal magnesium. This will be aggressively repleted.she does take lasix.  D/w Dr. Lorin Mercy hospitalist who will evaluate for possible  admission for correct of hypokalemia versus transfer back to facility with hospice care. Final Clinical Impressions(s) / ED Diagnoses   Final diagnoses:  Fall, initial encounter  Hypokalemia    ED Discharge Orders    None       Brucha Ahlquist, Annie Main, MD 09/20/17 [phone]

## 2017-09-20 NOTE — ED Notes (Signed)
ED Provider at bedside. Rancour, MD removed back brace. Back brace at bedside.

## 2017-09-20 NOTE — ED Notes (Signed)
Per Dom at [facility], pt was found on the bathroom floor. Jaclyn Shaggy, from [facility] reports pt alert to x1 and x2 normally and sometimes x3, pt has intermittent confusion.

## 2017-09-20 NOTE — ED Notes (Signed)
Rancour, MD notified of critical potassium of 2.5. Will continue to monitor.

## 2017-09-20 NOTE — ED Notes (Signed)
Patient transported to X-ray 

## 2017-11-18 DEATH — deceased

## 2018-04-10 IMAGING — CT CT CERVICAL SPINE W/O CM
5 of 8 series · 14 of 33 positions shown, 15 images · non-contrast
Comparison: CT head 09/14/2017. CT head and cervical spine
03/25/2015

CLINICAL DATA: Unwitnessed fall. T4 thoracic spine fracture [REDACTED]. Patient struck head.

EXAM:
CT HEAD WITHOUT CONTRAST
CT CERVICAL SPINE WITHOUT CONTRAST
TECHNIQUE: Multidetector CT imaging of the head and cervical spine was
performed following the standard protocol without intravenous
contrast. Multiplanar CT image reconstructions of the cervical spine
were also generated.

[Series 5: head bone · axial · 0.42mm/px · z∈[-15,+39]mm · 2 of 81 slices shown]
[im 27/81  bone]
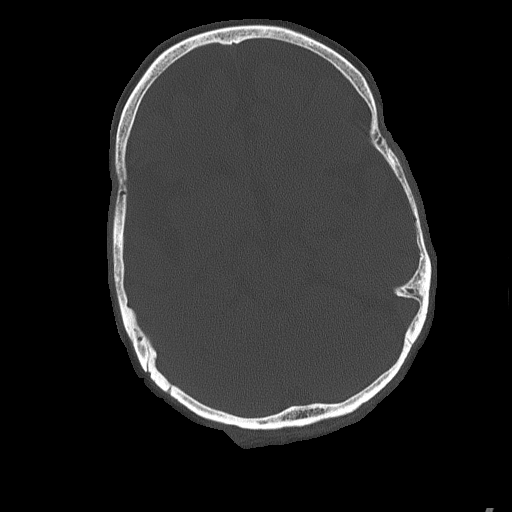
[im 54/81  bone]
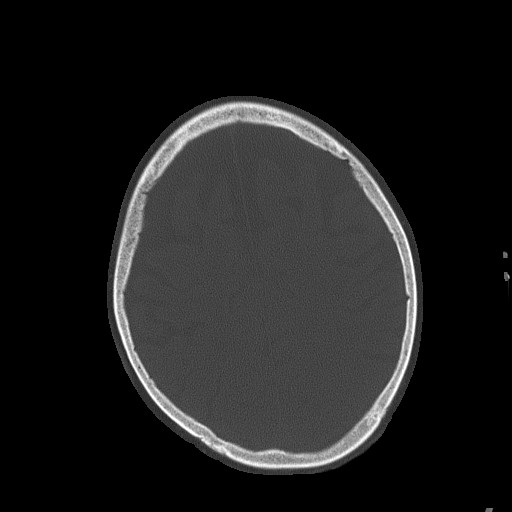

[Series 6: head without cor · coronal · non-contrast · 0.32mm/px · 3 of 67 slices shown]
[im 17/67  bone]
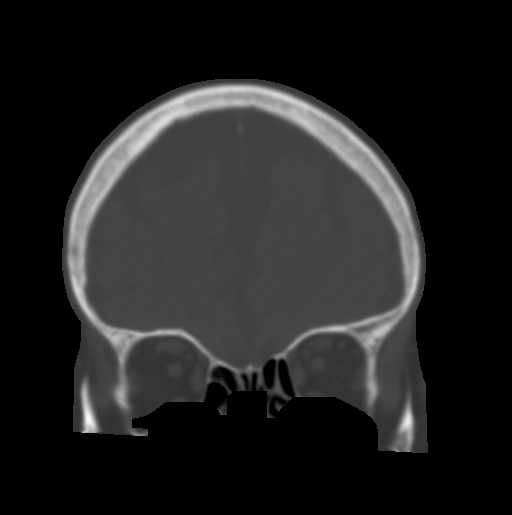
[im 34/67  bone]
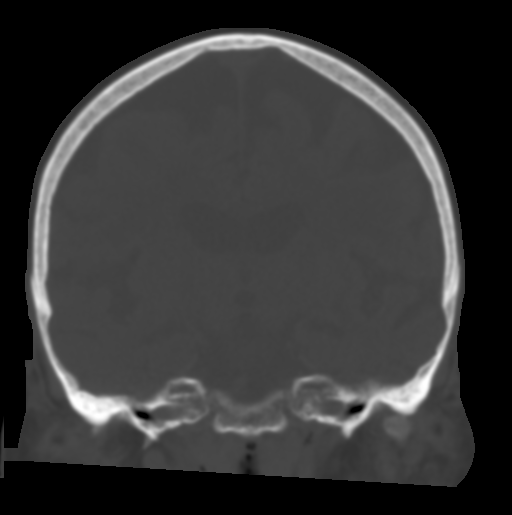
[im 50/67  bone]
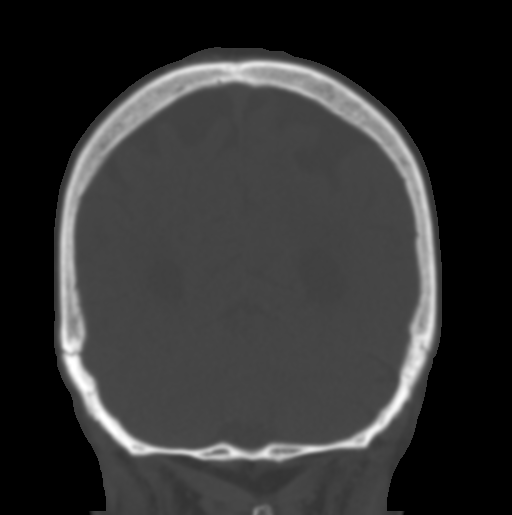

[Series 8: c_spine 2.0 st · axial · 0.26mm/px · z∈[-159,-105]mm · 2 of 79 slices shown, 3 images]
[im 27/79  soft-tissue]
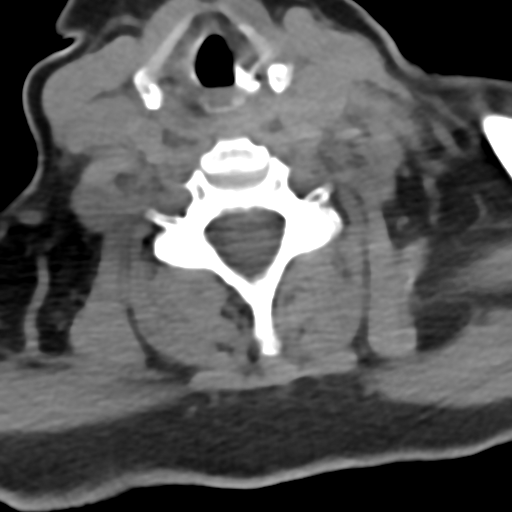
[im 27/79  bone]
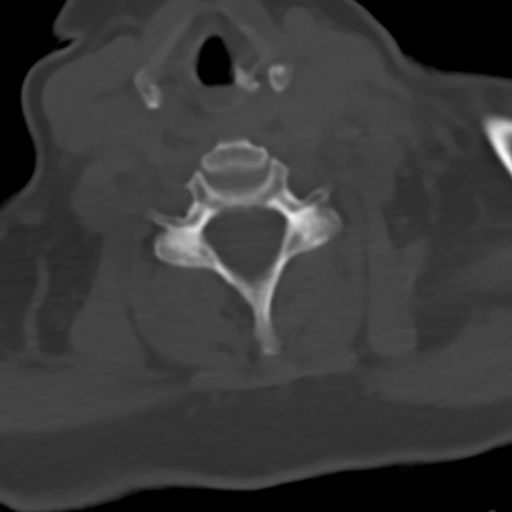
[im 53/79  bone]
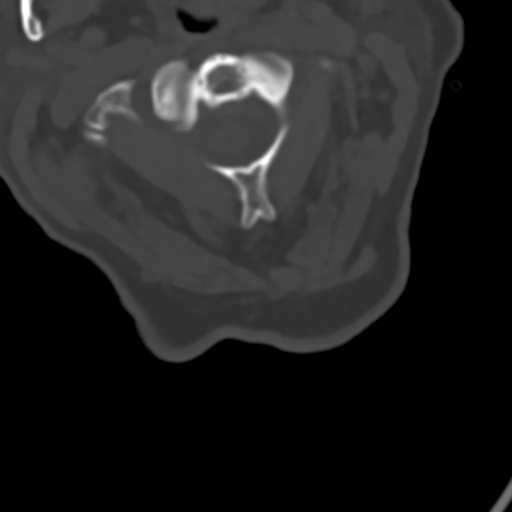

[Series 10: c_spine 2.0 sag bone · sagittal · 0.27mm/px · 5 of 61 slices shown]
[im 11/61  bone]
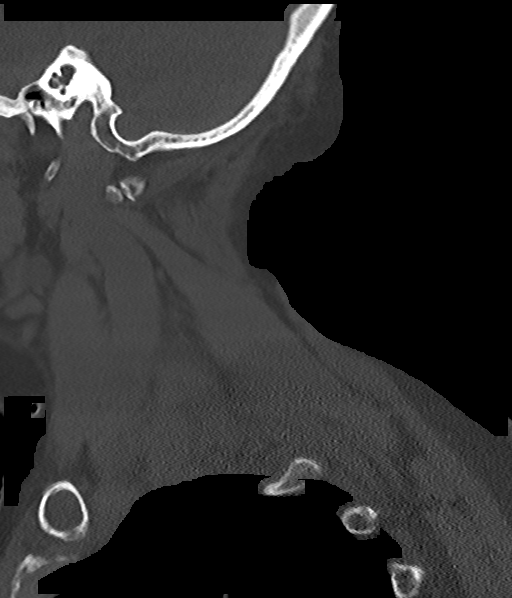
[im 21/61  bone]
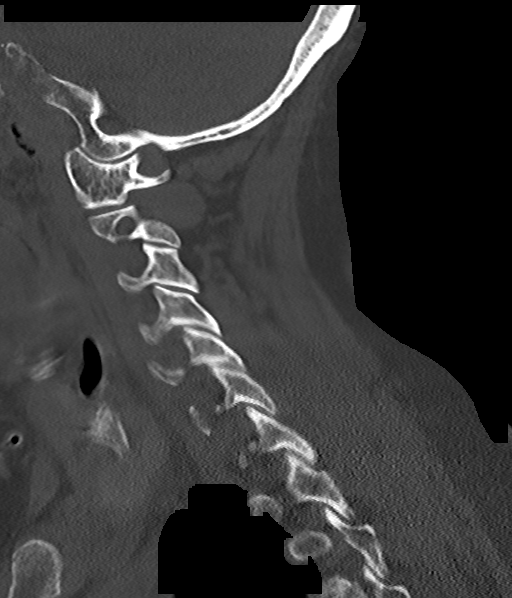
[im 31/61  bone]
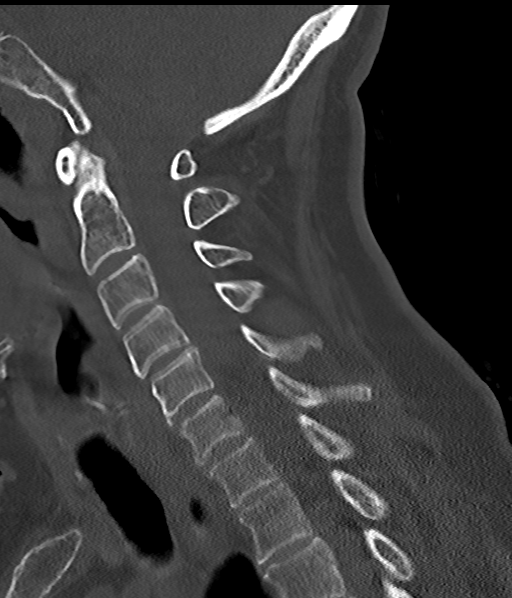
[im 41/61  bone]
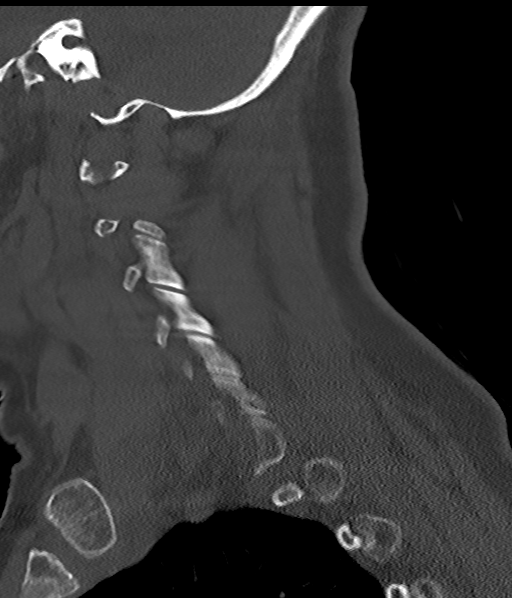
[im 51/61  bone]
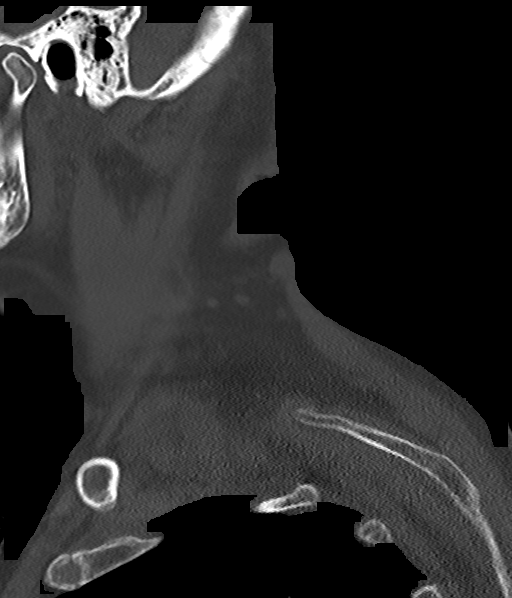

[Series 12: c_spine 2.0 orthogonals · axial · 0.21mm/px · z∈[-187,-143]mm · 2 of 82 slices shown]
[im 28/82  bone]
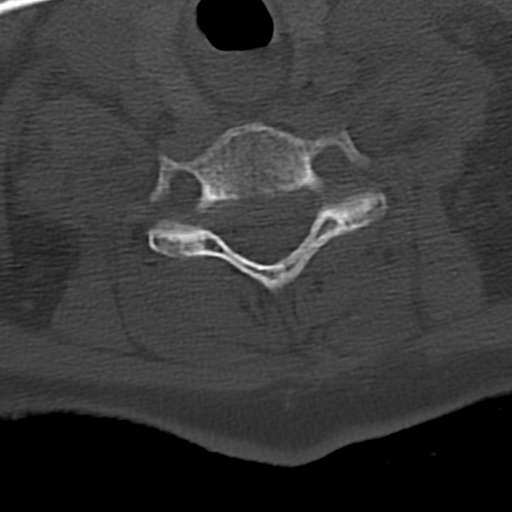
[im 55/82  bone]
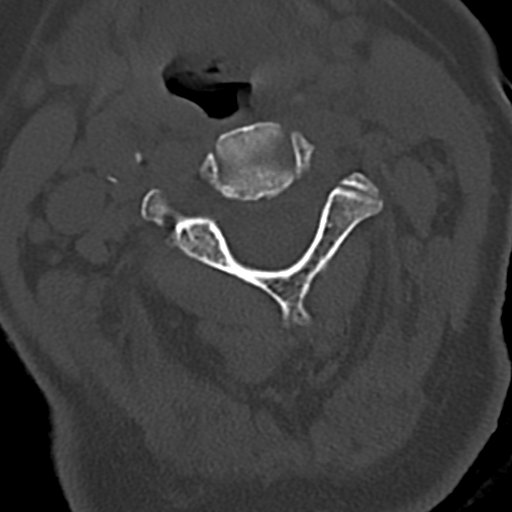

[14 of 33 positions shown; findings below may reference images not displayed]

FINDINGS: CT HEAD FINDINGS

Brain: Diffuse cerebral atrophy. Ventricular dilatation consistent
with central atrophy. Low-attenuation changes in the deep white
matter consistent with small vessel ischemia. No mass or midline
shift. No abnormal extra-axial fluid collections. Gray-white matter
junctions are distinct. Basal cisterns are not effaced. No acute
intracranial hemorrhage.

Vascular: Mild vascular calcifications.

Skull: Normal. Negative for fracture or focal lesion.

Sinuses/Orbits: Paranasal sinuses are clear. Opacification of
bilateral mastoid air cells.

Other: None.

CT CERVICAL SPINE FINDINGS: Examination is limited by motion
artifact.

Alignment: Normal alignment of the cervical vertebrae and facet
joints. C1-2 articulation appears intact.

Skull base and vertebrae: No vertebral compression. No focal bone
lesion or bone destruction.

Soft tissues and spinal canal: No prevertebral soft tissue swelling.
No paraspinal soft tissue mass or infiltration.

Disc levels: Mild degenerative changes at C4-5 and C5-6 levels and
C6-7 level.

Upper chest: Prominent emphysematous changes in the lung apices.

Other: None.
IMPRESSION: 1. No acute intracranial abnormalities.
2. Chronic atrophy and small vessel ischemic changes.
3. Normal alignment of the cervical spine.
4. No acute displaced fractures identified.
5. Mild degenerative changes in the cervical spine.
6. Bilateral mastoid effusions.
7. Emphysematous changes in the lung apices.

## 2018-04-10 IMAGING — CR DG CHEST 2V
2 series · 2 of 2 positions shown · non-contrast
Comparison: 02/04/2017

CLINICAL DATA: Shortness of breath.  Fell today.

EXAM:
CHEST - 2 VIEW

[chest lat]
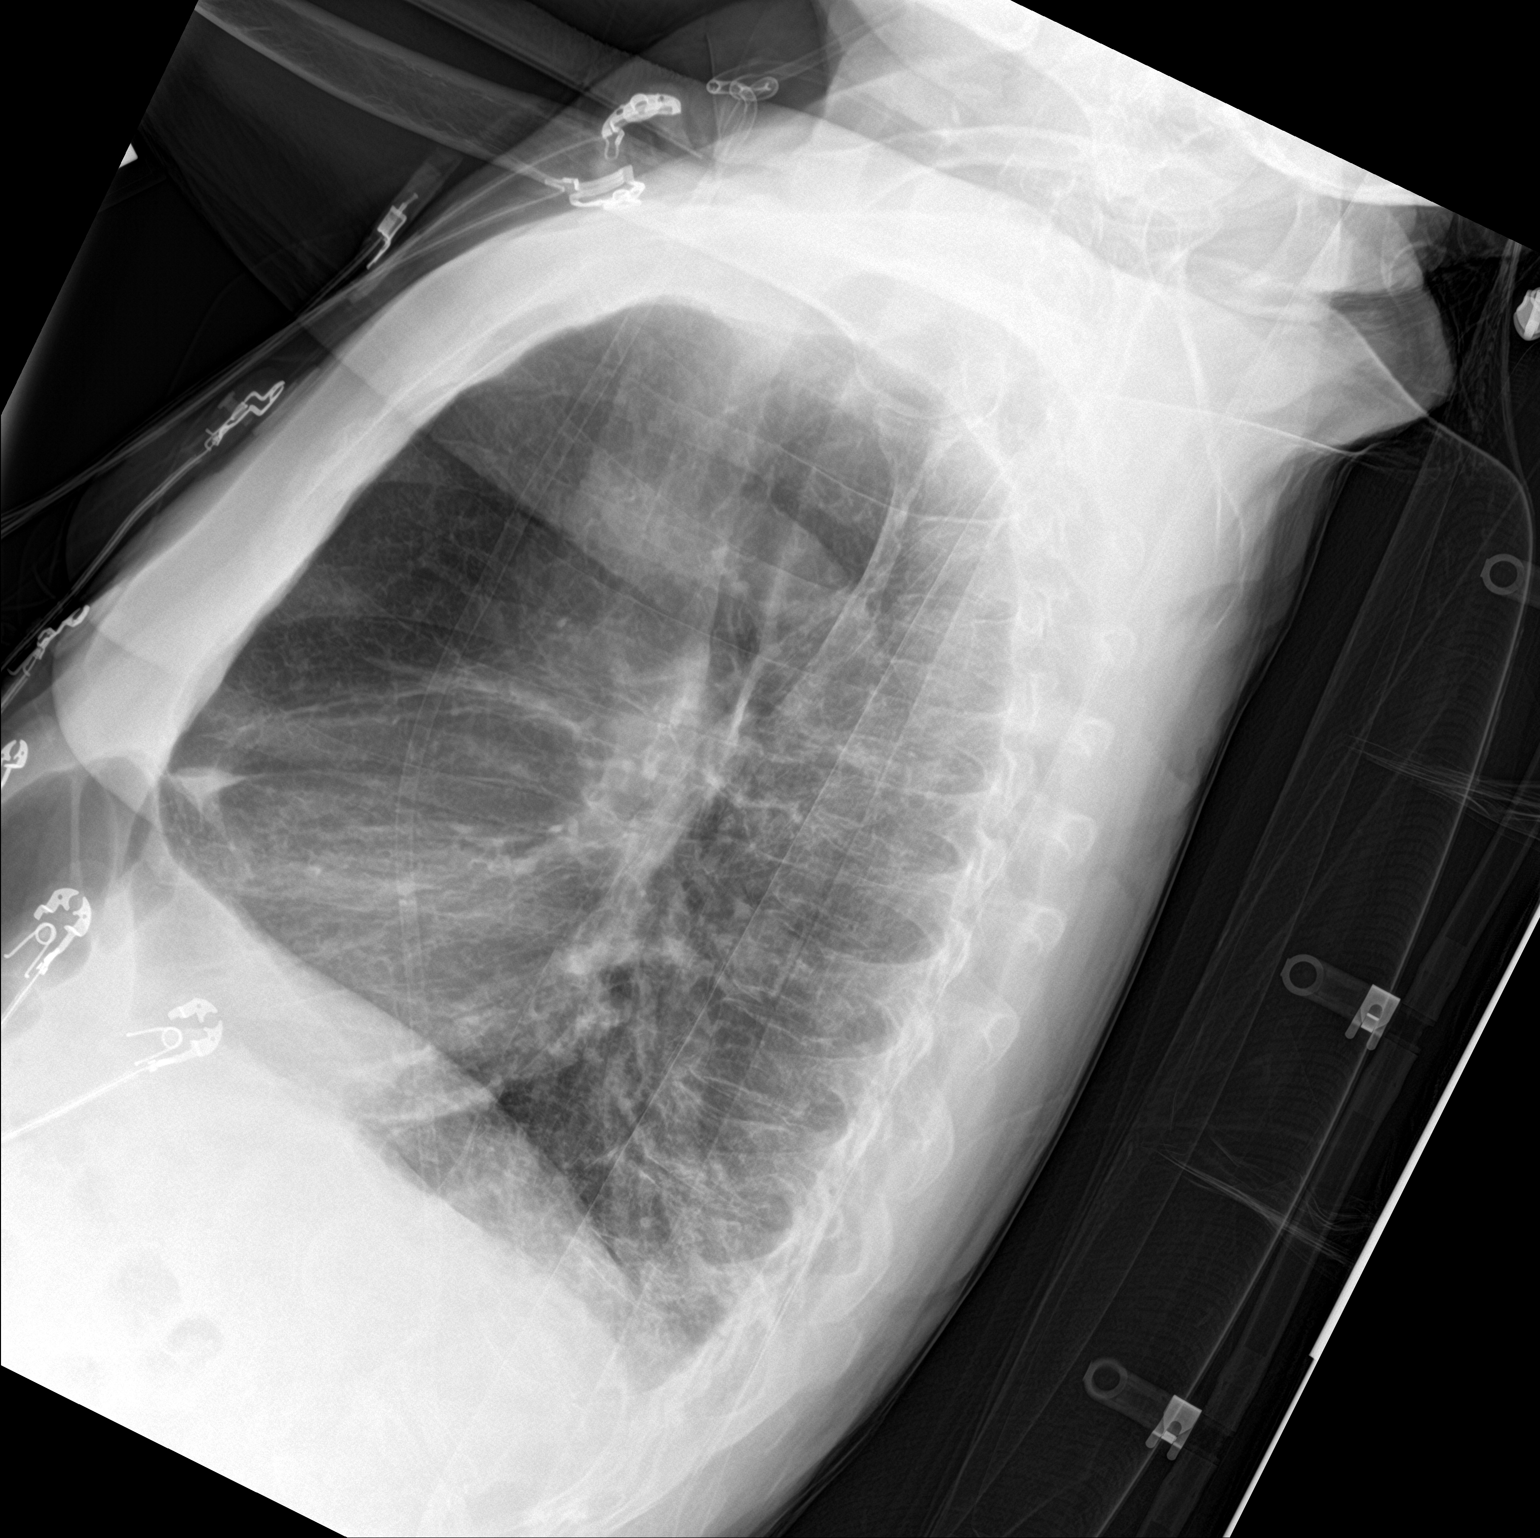

[chest ap]
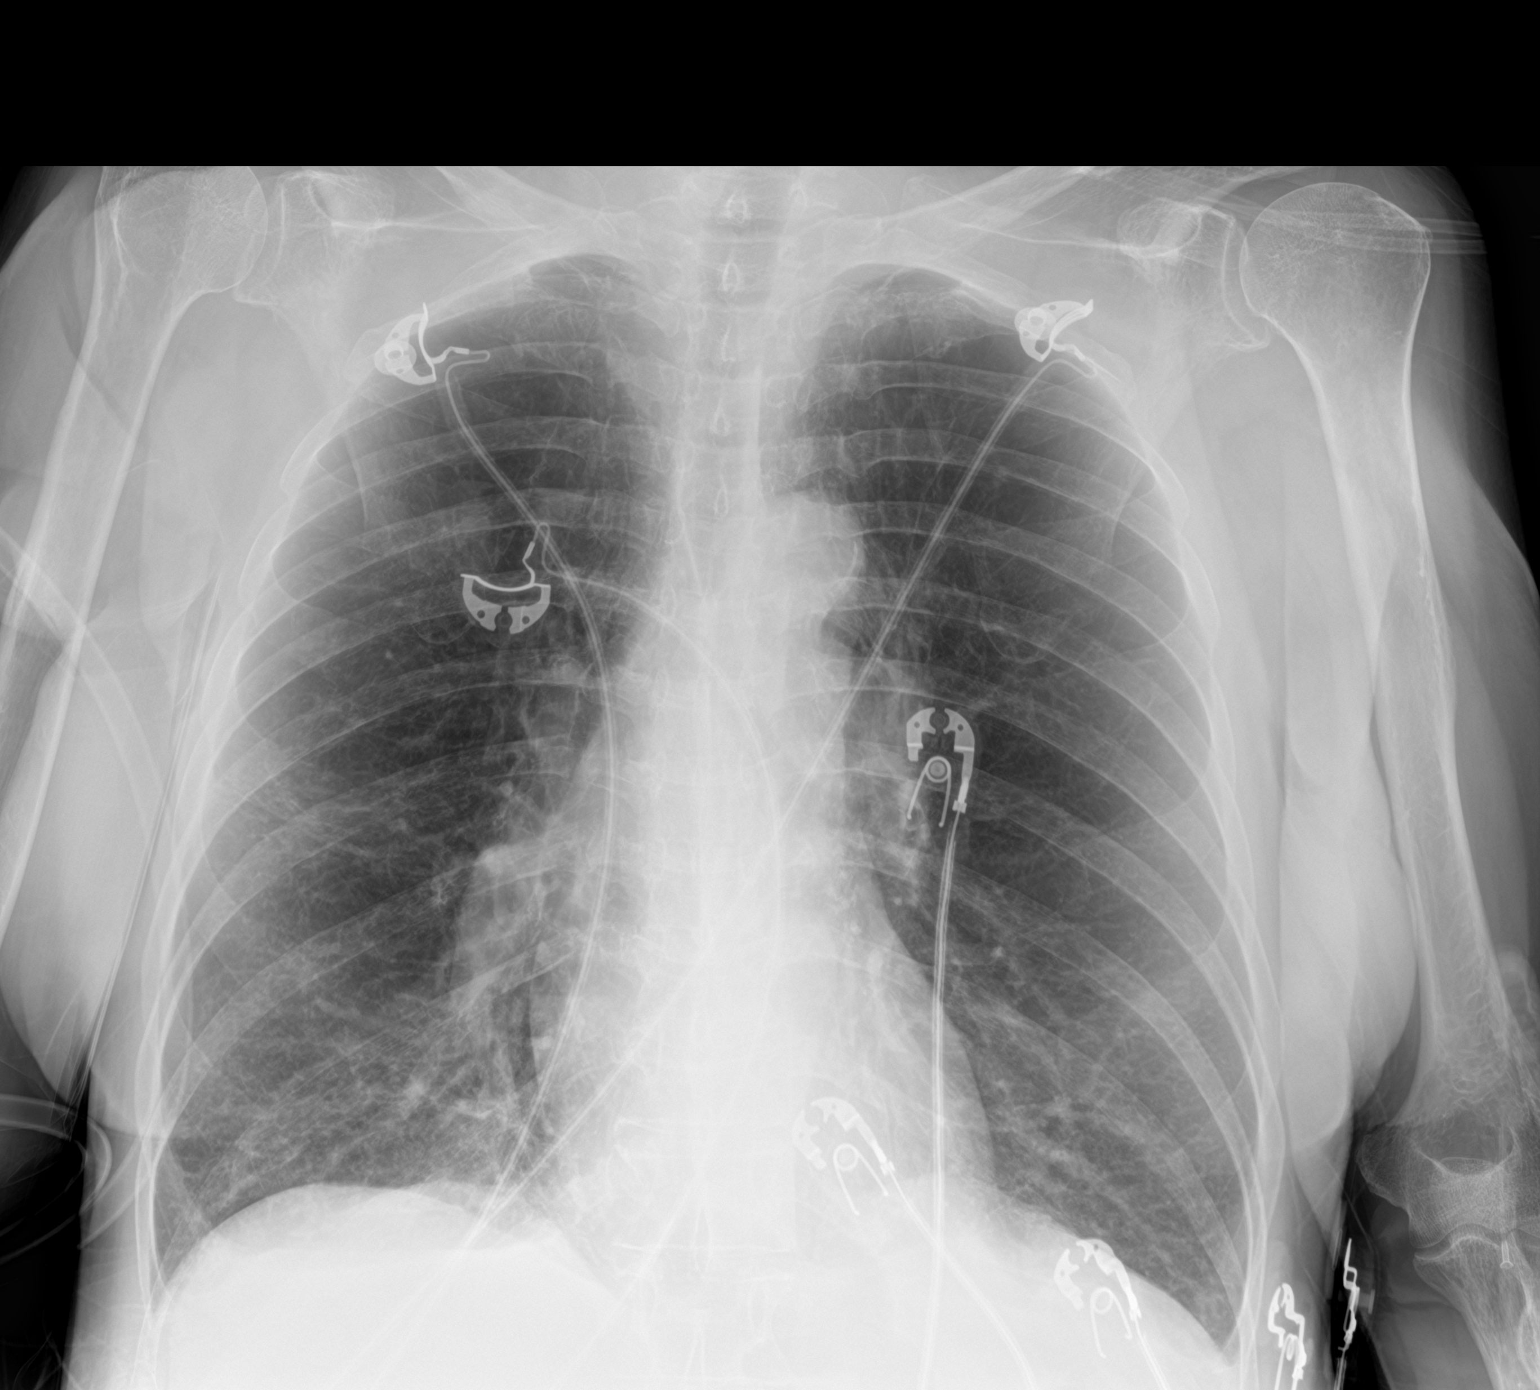

[2 of 2 positions shown; findings below may reference images not displayed]

FINDINGS: Heart size and pulmonary vascularity are normal. Interstitial
pattern to the lung bases is increased since previous study
suggesting edema or interstitial pneumonitis. There is probably some
underlying fibrosis as well. Emphysematous changes in the upper
lungs. Scattered calcified granulomas. No airspace disease or
consolidation. No blunting of costophrenic angles. No pneumothorax.
Calcification of the aorta.
IMPRESSION: Increased interstitial pattern to the lung bases suggesting
developing edema or interstitial pneumonitis. Underlying
emphysematous changes and fibrosis.
